# Patient Record
Sex: Female | Born: 1940 | Race: White | Hispanic: No | Marital: Married | Smoking: Never smoker
Health system: Southern US, Community
[De-identification: ages and names within clinical notes are randomized; demographics above are authoritative.]

## PROBLEM LIST (undated history)

## (undated) DIAGNOSIS — R200 Anesthesia of skin: Secondary | ICD-10-CM

## (undated) DIAGNOSIS — J189 Pneumonia, unspecified organism: Secondary | ICD-10-CM

## (undated) DIAGNOSIS — C801 Malignant (primary) neoplasm, unspecified: Secondary | ICD-10-CM

## (undated) DIAGNOSIS — M1990 Unspecified osteoarthritis, unspecified site: Secondary | ICD-10-CM

## (undated) HISTORY — PX: APPENDECTOMY: SHX54

## (undated) HISTORY — PX: DILATION AND CURETTAGE OF UTERUS: SHX78

## (undated) HISTORY — PX: TUBAL LIGATION: SHX77

## (undated) HISTORY — PX: BACK SURGERY: SHX140

## (undated) HISTORY — PX: TONSILLECTOMY: SUR1361

## (undated) HISTORY — PX: COLONOSCOPY: SHX174

## (undated) HISTORY — PX: BREAST SURGERY: SHX581

---

## 2006-03-07 DIAGNOSIS — C50912 Malignant neoplasm of unspecified site of left female breast: Secondary | ICD-10-CM | POA: Insufficient documentation

## 2006-03-07 DIAGNOSIS — C50412 Malignant neoplasm of upper-outer quadrant of left female breast: Secondary | ICD-10-CM | POA: Insufficient documentation

## 2006-03-07 HISTORY — DX: Malignant neoplasm of unspecified site of left female breast: C50.912

## 2006-03-07 HISTORY — DX: Malignant neoplasm of upper-outer quadrant of left female breast: C50.412

## 2006-04-28 ENCOUNTER — Ambulatory Visit: Payer: Self-pay | Admitting: Oncology

## 2006-06-11 ENCOUNTER — Ambulatory Visit: Payer: Self-pay | Admitting: Oncology

## 2006-07-01 ENCOUNTER — Ambulatory Visit: Payer: Self-pay | Admitting: Oncology

## 2006-08-18 ENCOUNTER — Ambulatory Visit: Payer: Self-pay | Admitting: Oncology

## 2006-10-03 ENCOUNTER — Ambulatory Visit: Payer: Self-pay | Admitting: Oncology

## 2006-11-11 ENCOUNTER — Ambulatory Visit: Admission: RE | Admit: 2006-11-11 | Discharge: 2007-02-09 | Payer: Self-pay | Admitting: Radiation Oncology

## 2007-01-22 ENCOUNTER — Ambulatory Visit: Payer: Self-pay | Admitting: Oncology

## 2007-04-16 ENCOUNTER — Ambulatory Visit: Payer: Self-pay | Admitting: Oncology

## 2007-07-03 ENCOUNTER — Ambulatory Visit: Payer: Self-pay | Admitting: Oncology

## 2007-08-11 ENCOUNTER — Encounter (INDEPENDENT_AMBULATORY_CARE_PROVIDER_SITE_OTHER): Payer: Self-pay | Admitting: Diagnostic Radiology

## 2007-08-11 ENCOUNTER — Encounter: Admission: RE | Admit: 2007-08-11 | Discharge: 2007-08-11 | Payer: Self-pay | Admitting: Oncology

## 2011-12-25 DIAGNOSIS — L570 Actinic keratosis: Secondary | ICD-10-CM | POA: Diagnosis not present

## 2012-01-29 DIAGNOSIS — M810 Age-related osteoporosis without current pathological fracture: Secondary | ICD-10-CM | POA: Diagnosis not present

## 2012-01-29 DIAGNOSIS — M25559 Pain in unspecified hip: Secondary | ICD-10-CM | POA: Diagnosis not present

## 2012-01-29 DIAGNOSIS — Z0000 Encounter for general adult medical examination without abnormal findings: Secondary | ICD-10-CM | POA: Diagnosis not present

## 2012-01-29 DIAGNOSIS — E785 Hyperlipidemia, unspecified: Secondary | ICD-10-CM | POA: Diagnosis not present

## 2012-01-29 DIAGNOSIS — C50919 Malignant neoplasm of unspecified site of unspecified female breast: Secondary | ICD-10-CM | POA: Diagnosis not present

## 2012-02-07 DIAGNOSIS — R946 Abnormal results of thyroid function studies: Secondary | ICD-10-CM | POA: Diagnosis not present

## 2012-02-07 DIAGNOSIS — M810 Age-related osteoporosis without current pathological fracture: Secondary | ICD-10-CM | POA: Diagnosis not present

## 2012-05-20 DIAGNOSIS — C50919 Malignant neoplasm of unspecified site of unspecified female breast: Secondary | ICD-10-CM | POA: Diagnosis not present

## 2012-05-22 DIAGNOSIS — M899 Disorder of bone, unspecified: Secondary | ICD-10-CM | POA: Diagnosis not present

## 2012-05-22 DIAGNOSIS — C50919 Malignant neoplasm of unspecified site of unspecified female breast: Secondary | ICD-10-CM | POA: Diagnosis not present

## 2012-05-22 DIAGNOSIS — M949 Disorder of cartilage, unspecified: Secondary | ICD-10-CM | POA: Diagnosis not present

## 2012-06-05 DIAGNOSIS — E785 Hyperlipidemia, unspecified: Secondary | ICD-10-CM | POA: Diagnosis not present

## 2012-07-07 DIAGNOSIS — Z23 Encounter for immunization: Secondary | ICD-10-CM | POA: Diagnosis not present

## 2012-08-27 DIAGNOSIS — M5137 Other intervertebral disc degeneration, lumbosacral region: Secondary | ICD-10-CM | POA: Diagnosis not present

## 2012-08-27 DIAGNOSIS — M25559 Pain in unspecified hip: Secondary | ICD-10-CM | POA: Diagnosis not present

## 2012-08-28 DIAGNOSIS — M5137 Other intervertebral disc degeneration, lumbosacral region: Secondary | ICD-10-CM | POA: Diagnosis not present

## 2012-08-28 DIAGNOSIS — M4120 Other idiopathic scoliosis, site unspecified: Secondary | ICD-10-CM | POA: Diagnosis not present

## 2012-08-28 DIAGNOSIS — M47817 Spondylosis without myelopathy or radiculopathy, lumbosacral region: Secondary | ICD-10-CM | POA: Diagnosis not present

## 2012-08-28 DIAGNOSIS — M25559 Pain in unspecified hip: Secondary | ICD-10-CM | POA: Diagnosis not present

## 2012-09-22 DIAGNOSIS — IMO0002 Reserved for concepts with insufficient information to code with codable children: Secondary | ICD-10-CM | POA: Diagnosis not present

## 2012-09-22 DIAGNOSIS — M1610 Unilateral primary osteoarthritis, unspecified hip: Secondary | ICD-10-CM | POA: Diagnosis not present

## 2012-09-25 DIAGNOSIS — M1610 Unilateral primary osteoarthritis, unspecified hip: Secondary | ICD-10-CM | POA: Diagnosis not present

## 2012-11-26 DIAGNOSIS — H43399 Other vitreous opacities, unspecified eye: Secondary | ICD-10-CM | POA: Diagnosis not present

## 2012-11-26 DIAGNOSIS — H2589 Other age-related cataract: Secondary | ICD-10-CM | POA: Diagnosis not present

## 2012-11-26 DIAGNOSIS — H52229 Regular astigmatism, unspecified eye: Secondary | ICD-10-CM | POA: Diagnosis not present

## 2012-11-26 DIAGNOSIS — H5200 Hypermetropia, unspecified eye: Secondary | ICD-10-CM | POA: Diagnosis not present

## 2013-03-15 DIAGNOSIS — M810 Age-related osteoporosis without current pathological fracture: Secondary | ICD-10-CM | POA: Diagnosis not present

## 2013-03-15 DIAGNOSIS — J309 Allergic rhinitis, unspecified: Secondary | ICD-10-CM | POA: Diagnosis not present

## 2013-03-15 DIAGNOSIS — E785 Hyperlipidemia, unspecified: Secondary | ICD-10-CM | POA: Diagnosis not present

## 2013-03-15 DIAGNOSIS — Z0000 Encounter for general adult medical examination without abnormal findings: Secondary | ICD-10-CM | POA: Diagnosis not present

## 2013-03-23 DIAGNOSIS — Z1211 Encounter for screening for malignant neoplasm of colon: Secondary | ICD-10-CM | POA: Diagnosis not present

## 2013-03-30 DIAGNOSIS — M1610 Unilateral primary osteoarthritis, unspecified hip: Secondary | ICD-10-CM | POA: Diagnosis not present

## 2013-04-02 DIAGNOSIS — M1610 Unilateral primary osteoarthritis, unspecified hip: Secondary | ICD-10-CM | POA: Diagnosis not present

## 2013-04-02 DIAGNOSIS — M169 Osteoarthritis of hip, unspecified: Secondary | ICD-10-CM | POA: Diagnosis not present

## 2013-04-02 DIAGNOSIS — M79609 Pain in unspecified limb: Secondary | ICD-10-CM | POA: Diagnosis not present

## 2013-04-07 ENCOUNTER — Encounter (HOSPITAL_COMMUNITY): Payer: Self-pay | Admitting: Pharmacy Technician

## 2013-04-07 ENCOUNTER — Other Ambulatory Visit (HOSPITAL_COMMUNITY): Payer: Self-pay | Admitting: Orthopaedic Surgery

## 2013-04-07 DIAGNOSIS — M169 Osteoarthritis of hip, unspecified: Secondary | ICD-10-CM | POA: Diagnosis not present

## 2013-04-07 DIAGNOSIS — M25559 Pain in unspecified hip: Secondary | ICD-10-CM | POA: Diagnosis not present

## 2013-04-12 ENCOUNTER — Encounter (HOSPITAL_COMMUNITY): Payer: Self-pay | Admitting: *Deleted

## 2013-04-12 MED ORDER — CEFAZOLIN SODIUM-DEXTROSE 2-3 GM-% IV SOLR
2.0000 g | INTRAVENOUS | Status: AC
  Administered 2013-04-13: 2 g via INTRAVENOUS
  Filled 2013-04-12: qty 50

## 2013-04-12 NOTE — Progress Notes (Addendum)
Pt denies SOB, chest pain, and being under the cardiologist. According to pt, " I had an echo done more than 5 years ago when I had breast cancer."  Pt denies having any other cardiac tests done ( no stress, or cardiac cath). Pt advised to Stop taking Aspirin and herbal medications (Flaxseed, Linseed, OIL and   glucosamine-chondroitin 500-400 MG table). Do not take any NSAIDs ie: Ibuprofen, Advil, Naproxen or any medication containing Aspirin.

## 2013-04-13 ENCOUNTER — Encounter (HOSPITAL_COMMUNITY)
Admission: RE | Disposition: A | Discharge status: Home Health | Payer: Self-pay | Source: Ambulatory Visit | Attending: Orthopaedic Surgery

## 2013-04-13 ENCOUNTER — Encounter (HOSPITAL_COMMUNITY): Payer: Self-pay

## 2013-04-13 ENCOUNTER — Encounter (HOSPITAL_COMMUNITY): Payer: Self-pay | Admitting: Critical Care Medicine

## 2013-04-13 ENCOUNTER — Inpatient Hospital Stay (HOSPITAL_COMMUNITY)
Admission: RE | Admit: 2013-04-13 | Discharge: 2013-04-16 | DRG: 470 | Disposition: A | Discharge status: Home Health | Payer: Medicare Other | Source: Ambulatory Visit | Attending: Orthopaedic Surgery | Admitting: Orthopaedic Surgery

## 2013-04-13 ENCOUNTER — Inpatient Hospital Stay (HOSPITAL_COMMUNITY): Payer: Medicare Other

## 2013-04-13 ENCOUNTER — Inpatient Hospital Stay (HOSPITAL_COMMUNITY): Payer: Medicare Other | Admitting: Critical Care Medicine

## 2013-04-13 DIAGNOSIS — Z9851 Tubal ligation status: Secondary | ICD-10-CM | POA: Diagnosis not present

## 2013-04-13 DIAGNOSIS — D62 Acute posthemorrhagic anemia: Secondary | ICD-10-CM | POA: Diagnosis not present

## 2013-04-13 DIAGNOSIS — Z853 Personal history of malignant neoplasm of breast: Secondary | ICD-10-CM | POA: Diagnosis not present

## 2013-04-13 DIAGNOSIS — M1610 Unilateral primary osteoarthritis, unspecified hip: Secondary | ICD-10-CM | POA: Diagnosis not present

## 2013-04-13 DIAGNOSIS — Z471 Aftercare following joint replacement surgery: Secondary | ICD-10-CM | POA: Diagnosis not present

## 2013-04-13 DIAGNOSIS — Z96649 Presence of unspecified artificial hip joint: Secondary | ICD-10-CM | POA: Diagnosis not present

## 2013-04-13 DIAGNOSIS — Z7982 Long term (current) use of aspirin: Secondary | ICD-10-CM | POA: Diagnosis not present

## 2013-04-13 DIAGNOSIS — M169 Osteoarthritis of hip, unspecified: Secondary | ICD-10-CM

## 2013-04-13 DIAGNOSIS — Z79899 Other long term (current) drug therapy: Secondary | ICD-10-CM | POA: Diagnosis not present

## 2013-04-13 DIAGNOSIS — M259 Joint disorder, unspecified: Secondary | ICD-10-CM | POA: Diagnosis not present

## 2013-04-13 DIAGNOSIS — Z9089 Acquired absence of other organs: Secondary | ICD-10-CM

## 2013-04-13 DIAGNOSIS — Z882 Allergy status to sulfonamides status: Secondary | ICD-10-CM

## 2013-04-13 DIAGNOSIS — M25559 Pain in unspecified hip: Secondary | ICD-10-CM | POA: Diagnosis not present

## 2013-04-13 HISTORY — DX: Pneumonia, unspecified organism: J18.9

## 2013-04-13 HISTORY — DX: Anesthesia of skin: R20.0

## 2013-04-13 HISTORY — PX: TOTAL HIP ARTHROPLASTY: SHX124

## 2013-04-13 HISTORY — DX: Malignant (primary) neoplasm, unspecified: C80.1

## 2013-04-13 HISTORY — DX: Unspecified osteoarthritis, unspecified site: M19.90

## 2013-04-13 HISTORY — DX: Osteoarthritis of hip, unspecified: M16.9

## 2013-04-13 LAB — CBC
MCH: 30.8 pg (ref 26.0–34.0)
Platelets: 239 10*3/uL (ref 150–400)
RDW: 13.5 % (ref 11.5–15.5)

## 2013-04-13 LAB — BASIC METABOLIC PANEL
Calcium: 9.2 mg/dL (ref 8.4–10.5)
Chloride: 107 mEq/L (ref 96–112)
Creatinine, Ser: 0.7 mg/dL (ref 0.50–1.10)
GFR calc Af Amer: >90 mL/min (ref >90)
GFR calc non Af Amer: 83 mL/min — ABNORMAL LOW (ref >90)
Sodium: 141 mEq/L (ref 135–145)

## 2013-04-13 LAB — URINALYSIS, ROUTINE W REFLEX MICROSCOPIC
Leukocytes, UA: NEGATIVE
Nitrite: NEGATIVE
Protein, ur: NEGATIVE mg/dL
Specific Gravity, Urine: 1.014 (ref 1.005–1.030)

## 2013-04-13 LAB — SURGICAL PCR SCREEN: MRSA, PCR: NEGATIVE

## 2013-04-13 SURGERY — ARTHROPLASTY, HIP, TOTAL, ANTERIOR APPROACH
Anesthesia: General | Site: Hip | Laterality: Right | Wound class: Clean

## 2013-04-13 MED ORDER — ALUM & MAG HYDROXIDE-SIMETH 200-200-20 MG/5ML PO SUSP: 30.0000 mL | ORAL | Status: DC | PRN

## 2013-04-13 MED ORDER — METHOCARBAMOL 500 MG PO TABS
500.0000 mg | ORAL_TABLET | Freq: Four times a day (QID) | ORAL | Status: DC | PRN
  Administered 2013-04-13 – 2013-04-16 (×3): 500 mg via ORAL
  Filled 2013-04-13 (×2): qty 1

## 2013-04-13 MED ORDER — 0.9 % SODIUM CHLORIDE (POUR BTL) OPTIME
TOPICAL | Status: DC | PRN
  Administered 2013-04-13: 1000 mL

## 2013-04-13 MED ORDER — BISACODYL 5 MG PO TBEC: 5.0000 mg | DELAYED_RELEASE_TABLET | Freq: Every day | ORAL | Status: DC | PRN

## 2013-04-13 MED ORDER — CEFAZOLIN SODIUM 1-5 GM-% IV SOLN
1.0000 g | Freq: Four times a day (QID) | INTRAVENOUS | Status: AC
  Administered 2013-04-13 – 2013-04-14 (×2): 1 g via INTRAVENOUS
  Filled 2013-04-13 (×2): qty 50

## 2013-04-13 MED ORDER — LACTATED RINGERS IV SOLN
INTRAVENOUS | Status: DC
  Administered 2013-04-13 (×2): via INTRAVENOUS

## 2013-04-13 MED ORDER — SODIUM CHLORIDE 0.9 % IV SOLN
INTRAVENOUS | Status: DC
  Administered 2013-04-13: 23:00:00 via INTRAVENOUS

## 2013-04-13 MED ORDER — OXYCODONE HCL 5 MG/5ML PO SOLN
ORAL | Status: AC
  Filled 2013-04-13: qty 10

## 2013-04-13 MED ORDER — ALBUMIN HUMAN 5 % IV SOLN
INTRAVENOUS | Status: DC | PRN
  Administered 2013-04-13 (×2): via INTRAVENOUS

## 2013-04-13 MED ORDER — HYDROMORPHONE HCL PF 1 MG/ML IJ SOLN
1.0000 mg | INTRAMUSCULAR | Status: DC | PRN
  Filled 2013-04-13: qty 1

## 2013-04-13 MED ORDER — PHENYLEPHRINE HCL 10 MG/ML IJ SOLN
INTRAMUSCULAR | Status: DC | PRN
  Administered 2013-04-13 (×2): 80 ug via INTRAVENOUS
  Administered 2013-04-13: 120 ug via INTRAVENOUS
  Administered 2013-04-13: 80 ug via INTRAVENOUS

## 2013-04-13 MED ORDER — ONDANSETRON HCL 4 MG/2ML IJ SOLN
4.0000 mg | Freq: Four times a day (QID) | INTRAMUSCULAR | Status: DC | PRN
  Administered 2013-04-13: 4 mg via INTRAVENOUS
  Filled 2013-04-13: qty 2

## 2013-04-13 MED ORDER — DOCUSATE SODIUM 100 MG PO CAPS
100.0000 mg | ORAL_CAPSULE | Freq: Two times a day (BID) | ORAL | Status: DC
  Administered 2013-04-13 – 2013-04-16 (×6): 100 mg via ORAL
  Filled 2013-04-13 (×7): qty 1

## 2013-04-13 MED ORDER — MENTHOL 3 MG MT LOZG: 1.0000 | LOZENGE | OROMUCOSAL | Status: DC | PRN

## 2013-04-13 MED ORDER — METHOCARBAMOL 100 MG/ML IJ SOLN
500.0000 mg | Freq: Four times a day (QID) | INTRAVENOUS | Status: DC | PRN
  Filled 2013-04-13: qty 5

## 2013-04-13 MED ORDER — METHOCARBAMOL 500 MG PO TABS
ORAL_TABLET | ORAL | Status: AC
  Filled 2013-04-13: qty 1

## 2013-04-13 MED ORDER — PROPOFOL 10 MG/ML IV BOLUS
INTRAVENOUS | Status: DC | PRN
  Administered 2013-04-13: 90 mg via INTRAVENOUS

## 2013-04-13 MED ORDER — ACETAMINOPHEN 325 MG PO TABS
650.0000 mg | ORAL_TABLET | Freq: Four times a day (QID) | ORAL | Status: DC | PRN
  Administered 2013-04-15: 650 mg via ORAL
  Filled 2013-04-13: qty 2

## 2013-04-13 MED ORDER — LIDOCAINE HCL (CARDIAC) 20 MG/ML IV SOLN
INTRAVENOUS | Status: DC | PRN
  Administered 2013-04-13: 40 mg via INTRAVENOUS

## 2013-04-13 MED ORDER — POLYETHYLENE GLYCOL 3350 17 G PO PACK: 17.0000 g | PACK | Freq: Every day | ORAL | Status: DC | PRN

## 2013-04-13 MED ORDER — PHENOL 1.4 % MT LIQD: 1.0000 | OROMUCOSAL | Status: DC | PRN

## 2013-04-13 MED ORDER — HYDROMORPHONE HCL PF 1 MG/ML IJ SOLN
INTRAMUSCULAR | Status: AC
  Filled 2013-04-13: qty 1

## 2013-04-13 MED ORDER — PHENYLEPHRINE HCL 10 MG/ML IJ SOLN
10.0000 mg | INTRAVENOUS | Status: DC | PRN
  Administered 2013-04-13: 40 ug/min via INTRAVENOUS

## 2013-04-13 MED ORDER — ARTIFICIAL TEARS OP OINT
TOPICAL_OINTMENT | OPHTHALMIC | Status: DC | PRN
  Administered 2013-04-13: 1 via OPHTHALMIC

## 2013-04-13 MED ORDER — SODIUM CHLORIDE 0.9 % IR SOLN
Status: DC | PRN
  Administered 2013-04-13: 3000 mL

## 2013-04-13 MED ORDER — ROCURONIUM BROMIDE 100 MG/10ML IV SOLN
INTRAVENOUS | Status: DC | PRN
  Administered 2013-04-13: 40 mg via INTRAVENOUS

## 2013-04-13 MED ORDER — NEOSTIGMINE METHYLSULFATE 1 MG/ML IJ SOLN
INTRAMUSCULAR | Status: DC | PRN
  Administered 2013-04-13: 4 mg via INTRAVENOUS

## 2013-04-13 MED ORDER — FENTANYL CITRATE 0.05 MG/ML IJ SOLN
INTRAMUSCULAR | Status: DC | PRN
  Administered 2013-04-13 (×3): 50 ug via INTRAVENOUS
  Administered 2013-04-13 (×2): 100 ug via INTRAVENOUS
  Administered 2013-04-13: 50 ug via INTRAVENOUS

## 2013-04-13 MED ORDER — GLYCOPYRROLATE 0.2 MG/ML IJ SOLN
INTRAMUSCULAR | Status: DC | PRN
  Administered 2013-04-13: .4 mg via INTRAVENOUS

## 2013-04-13 MED ORDER — ASPIRIN EC 325 MG PO TBEC
325.0000 mg | DELAYED_RELEASE_TABLET | Freq: Two times a day (BID) | ORAL | Status: DC
  Administered 2013-04-14 – 2013-04-16 (×5): 325 mg via ORAL
  Filled 2013-04-13 (×7): qty 1

## 2013-04-13 MED ORDER — ACETAMINOPHEN 650 MG RE SUPP: 650.0000 mg | Freq: Four times a day (QID) | RECTAL | Status: DC | PRN

## 2013-04-13 MED ORDER — METOCLOPRAMIDE HCL 5 MG/ML IJ SOLN: 5.0000 mg | Freq: Three times a day (TID) | INTRAMUSCULAR | Status: DC | PRN

## 2013-04-13 MED ORDER — OXYCODONE HCL 5 MG PO TABS
5.0000 mg | ORAL_TABLET | ORAL | Status: DC | PRN
  Administered 2013-04-13 – 2013-04-14 (×2): 10 mg via ORAL
  Administered 2013-04-15 – 2013-04-16 (×2): 5 mg via ORAL
  Filled 2013-04-13: qty 2
  Filled 2013-04-13 (×2): qty 1

## 2013-04-13 MED ORDER — DIPHENHYDRAMINE HCL 12.5 MG/5ML PO ELIX: 12.5000 mg | ORAL_SOLUTION | ORAL | Status: DC | PRN

## 2013-04-13 MED ORDER — MUPIROCIN 2 % EX OINT
TOPICAL_OINTMENT | CUTANEOUS | Status: AC
  Administered 2013-04-13: 1 via NASAL
  Filled 2013-04-13: qty 22

## 2013-04-13 MED ORDER — METOCLOPRAMIDE HCL 10 MG PO TABS: 5.0000 mg | ORAL_TABLET | Freq: Three times a day (TID) | ORAL | Status: DC | PRN

## 2013-04-13 MED ORDER — FERROUS SULFATE 325 (65 FE) MG PO TABS
325.0000 mg | ORAL_TABLET | Freq: Three times a day (TID) | ORAL | Status: DC
  Administered 2013-04-14 – 2013-04-16 (×7): 325 mg via ORAL
  Filled 2013-04-13 (×11): qty 1

## 2013-04-13 MED ORDER — MIDAZOLAM HCL 5 MG/5ML IJ SOLN
INTRAMUSCULAR | Status: DC | PRN
  Administered 2013-04-13: 2 mg via INTRAVENOUS

## 2013-04-13 MED ORDER — ZOLPIDEM TARTRATE 5 MG PO TABS: 5.0000 mg | ORAL_TABLET | Freq: Every evening | ORAL | Status: DC | PRN

## 2013-04-13 MED ORDER — ONDANSETRON HCL 4 MG PO TABS: 4.0000 mg | ORAL_TABLET | Freq: Four times a day (QID) | ORAL | Status: DC | PRN

## 2013-04-13 MED ORDER — ONDANSETRON HCL 4 MG/2ML IJ SOLN
INTRAMUSCULAR | Status: DC | PRN
  Administered 2013-04-13: 4 mg via INTRAVENOUS

## 2013-04-13 MED ORDER — MUPIROCIN 2 % EX OINT
TOPICAL_OINTMENT | Freq: Two times a day (BID) | CUTANEOUS | Status: DC
Start: 2013-04-13 — End: 2013-04-13
  Administered 2013-04-13: 1 via NASAL

## 2013-04-13 SURGICAL SUPPLY — 58 items
ADH SKN CLS APL DERMABOND .7 (GAUZE/BANDAGES/DRESSINGS) ×1
ADH SKN CLS LQ APL DERMABOND (GAUZE/BANDAGES/DRESSINGS) ×1
BANDAGE GAUZE ELAST BULKY 4 IN (GAUZE/BANDAGES/DRESSINGS) IMPLANT
BLADE SAW SGTL 18X1.27X75 (BLADE) ×2 IMPLANT
BLADE SURG ROTATE 9660 (MISCELLANEOUS) IMPLANT
CANISTER SUCTION 2500CC (MISCELLANEOUS) ×1 IMPLANT
CAPT HIP PF MOP ×1 IMPLANT
CELLS DAT CNTRL 66122 CELL SVR (MISCELLANEOUS) ×1 IMPLANT
CLOTH BEACON ORANGE TIMEOUT ST (SAFETY) ×2 IMPLANT
COVER BACK TABLE 24X17X13 BIG (DRAPES) IMPLANT
COVER SURGICAL LIGHT HANDLE (MISCELLANEOUS) ×2 IMPLANT
DERMABOND ADHESIVE PROPEN (GAUZE/BANDAGES/DRESSINGS) ×1
DERMABOND ADVANCED (GAUZE/BANDAGES/DRESSINGS) ×1
DERMABOND ADVANCED .7 DNX12 (GAUZE/BANDAGES/DRESSINGS) IMPLANT
DERMABOND ADVANCED .7 DNX6 (GAUZE/BANDAGES/DRESSINGS) ×1 IMPLANT
DRAPE C-ARM 42X72 X-RAY (DRAPES) ×2 IMPLANT
DRAPE STERI IOBAN 125X83 (DRAPES) ×2 IMPLANT
DRAPE U-SHAPE 47X51 STRL (DRAPES) ×6 IMPLANT
DRSG AQUACEL AG ADV 3.5X10 (GAUZE/BANDAGES/DRESSINGS) ×2 IMPLANT
DURAPREP 26ML APPLICATOR (WOUND CARE) ×2 IMPLANT
ELECT BLADE 4.0 EZ CLEAN MEGAD (MISCELLANEOUS)
ELECT BLADE TIP CTD 4 INCH (ELECTRODE) ×2 IMPLANT
ELECT CAUTERY BLADE 6.4 (BLADE) ×2 IMPLANT
ELECT REM PT RETURN 9FT ADLT (ELECTROSURGICAL) ×2
ELECTRODE BLDE 4.0 EZ CLN MEGD (MISCELLANEOUS) IMPLANT
ELECTRODE REM PT RTRN 9FT ADLT (ELECTROSURGICAL) ×1 IMPLANT
FACESHIELD LNG OPTICON STERILE (SAFETY) ×4 IMPLANT
GAUZE XEROFORM 1X8 LF (GAUZE/BANDAGES/DRESSINGS) ×2 IMPLANT
GLOVE BIOGEL PI IND STRL 8 (GLOVE) ×2 IMPLANT
GLOVE BIOGEL PI INDICATOR 8 (GLOVE) ×2
GLOVE ECLIPSE 8.0 STRL XLNG CF (GLOVE) ×2 IMPLANT
GLOVE SURG ORTHO 8.0 STRL STRW (GLOVE) ×2 IMPLANT
GOWN STRL REIN XL XLG (GOWN DISPOSABLE) ×4 IMPLANT
HANDPIECE INTERPULSE COAX TIP (DISPOSABLE) ×2
KIT BASIN OR (CUSTOM PROCEDURE TRAY) ×2 IMPLANT
KIT ROOM TURNOVER OR (KITS) ×2 IMPLANT
MANIFOLD NEPTUNE II (INSTRUMENTS) ×2 IMPLANT
NS IRRIG 1000ML POUR BTL (IV SOLUTION) ×2 IMPLANT
PACK TOTAL JOINT (CUSTOM PROCEDURE TRAY) ×2 IMPLANT
PAD ARMBOARD 7.5X6 YLW CONV (MISCELLANEOUS) ×4 IMPLANT
RETRACTOR WND ALEXIS 18 MED (MISCELLANEOUS) ×1 IMPLANT
RTRCTR WOUND ALEXIS 18CM MED (MISCELLANEOUS) ×2
SET HNDPC FAN SPRY TIP SCT (DISPOSABLE) ×1 IMPLANT
SPONGE LAP 18X18 X RAY DECT (DISPOSABLE) IMPLANT
SPONGE LAP 4X18 X RAY DECT (DISPOSABLE) IMPLANT
STAPLER VISISTAT 35W (STAPLE) ×2 IMPLANT
SUT ETHIBOND NAB CT1 #1 30IN (SUTURE) ×4 IMPLANT
SUT MNCRL AB 3-0 PS2 27 (SUTURE) ×2 IMPLANT
SUT VIC AB 0 CT1 27 (SUTURE) ×4
SUT VIC AB 0 CT1 27XBRD ANBCTR (SUTURE) ×2 IMPLANT
SUT VIC AB 1 CT1 27 (SUTURE) ×4
SUT VIC AB 1 CT1 27XBRD ANBCTR (SUTURE) ×2 IMPLANT
SUT VIC AB 2-0 CT1 27 (SUTURE) ×4
SUT VIC AB 2-0 CT1 TAPERPNT 27 (SUTURE) ×2 IMPLANT
TOWEL OR 17X24 6PK STRL BLUE (TOWEL DISPOSABLE) ×2 IMPLANT
TOWEL OR 17X26 10 PK STRL BLUE (TOWEL DISPOSABLE) ×2 IMPLANT
TRAY FOLEY CATH 14FR (SET/KITS/TRAYS/PACK) ×1 IMPLANT
WATER STERILE IRR 1000ML POUR (IV SOLUTION) ×2 IMPLANT

## 2013-04-13 NOTE — Anesthesia Postprocedure Evaluation (Signed)
Anesthesia Post Note  Patient: Carrie Wilkerson  Procedure(s) Performed: Procedure(s) (LRB): RIGHT TOTAL HIP ARTHROPLASTY ANTERIOR APPROACH (Right)  Anesthesia type: General  Patient location: PACU  Post pain: Pain level controlled and Adequate analgesia  Post assessment: Post-op Vital signs reviewed, Patient's Cardiovascular Status Stable, Respiratory Function Stable, Patent Airway and Pain level controlled  Last Vitals:  Filed Vitals:   04/13/13 1750  BP: 127/52  Pulse:   Temp: 36.3 C  Resp: 16    Post vital signs: Reviewed and stable  Level of consciousness: awake, alert  and oriented  Complications: No apparent anesthesia complications

## 2013-04-13 NOTE — H&P (Signed)
TOTAL HIP ADMISSION H&P  Patient is admitted for right total hip arthroplasty.  Subjective:  Chief Complaint: right hip pain  HPI: Carrie Wilkerson, 72 y.o. female, has a history of pain and functional disability in the right hip(s) due to arthritis and patient has failed non-surgical conservative treatments for greater than 12 weeks to include NSAID's and/or analgesics, corticosteriod injections, use of assistive devices and activity modification.  Onset of symptoms was abrupt starting 1 years ago with rapidlly worsening course since that time.The patient noted no past surgery on the right hip(s).  Patient currently rates pain in the right hip at 10 out of 10 with activity. Patient has night pain, worsening of pain with activity and weight bearing, trendelenberg gait, pain that interfers with activities of daily living and pain with passive range of motion. Patient has evidence of subchondral cysts, subchondral sclerosis, periarticular osteophytes and joint space narrowing by imaging studies. This condition presents safety issues increasing the risk of falls.  There is no current active infection.  Patient Active Problem List   Diagnosis Date Noted  . Degenerative arthritis of hip 04/13/2013   Past Medical History  Diagnosis Date  . Cancer     Hx: of breast cancer in 2007  . Pneumonia     Hx: of 'a long time ago"  . Numbness in right leg     Hx: of  . Arthritis     Past Surgical History  Procedure Laterality Date  . Breast surgery      Hx: of lumpectomy left breast 2007  . Colonoscopy      Hx: of  . Back surgery    . Tonsillectomy    . Appendectomy    . Dilation and curettage of uterus    . Tubal ligation      Prescriptions prior to admission  Medication Sig Dispense Refill  . alendronate (FOSAMAX) 70 MG tablet Take 70 mg by mouth every 7 (seven) days. Take with a full glass of water on an empty stomach.      Marland Kitchen aspirin 81 MG chewable tablet Chew 81 mg by mouth daily.      Marland Kitchen  CALCIUM PO Take 1 tablet by mouth daily.      . cholecalciferol (VITAMIN D) 1000 UNITS tablet Take 1,000 Units by mouth daily.      . Flaxseed, Linseed, OIL Take 1 capsule by mouth daily.      Marland Kitchen glucosamine-chondroitin 500-400 MG tablet Take 1 tablet by mouth 3 (three) times daily.      . Multiple Vitamin (MULTIVITAMIN WITH MINERALS) TABS Take 1 tablet by mouth daily.      Marland Kitchen PRESCRIPTION MEDICATION Take 1 tablet by mouth daily. Prescription estrogen blocker       Allergies  Allergen Reactions  . Codeine     unknown   . Other     "Chloro-prep": unknown   . Sulfa Antibiotics     unknown     History  Substance Use Topics  . Smoking status: Never Smoker   . Smokeless tobacco: Never Used  . Alcohol Use: No    Family History  Problem Relation Age of Onset  . Hypertension Sister   . Hypertension Brother   . Cancer Other      Review of Systems  Musculoskeletal: Positive for joint pain.  All other systems reviewed and are negative.    Objective:  Physical Exam  Constitutional: She is oriented to person, place, and time. She appears well-developed and well-nourished.  HENT:  Head: Normocephalic and atraumatic.  Eyes: EOM are normal. Pupils are equal, round, and reactive to light.  Neck: Normal range of motion. Neck supple.  GI: Bowel sounds are normal.  Musculoskeletal:       Right hip: She exhibits decreased range of motion, decreased strength, bony tenderness and crepitus.  Neurological: She is alert and oriented to person, place, and time.  Skin: Skin is warm.  Psychiatric: She has a normal mood and affect.    Vital signs in last 24 hours: Weight:  [70.761 kg (156 lb)] 70.761 kg (156 lb) (07/07 1550)  Labs:   Estimated body mass index is 28.06 kg/(m^2) as calculated from the following:   Height as of this encounter: 5' 2.5" (1.588 m).   Weight as of this encounter: 70.761 kg (156 lb).   Imaging Review Plain radiographs demonstrate severe degenerative joint  disease of the right hip(s). The bone quality appears to be good for age and reported activity level.  Assessment/Plan:  End stage arthritis, right hip(s)  The patient history, physical examination, clinical judgement of the provider and imaging studies are consistent with end stage degenerative joint disease of the right hip(s) and total hip arthroplasty is deemed medically necessary. The treatment options including medical management, injection therapy, arthroscopy and arthroplasty were discussed at length. The risks and benefits of total hip arthroplasty were presented and reviewed. The risks due to aseptic loosening, infection, stiffness, dislocation/subluxation,  thromboembolic complications and other imponderables were discussed.  The patient acknowledged the explanation, agreed to proceed with the plan and consent was signed. Patient is being admitted for inpatient treatment for surgery, pain control, PT, OT, prophylactic antibiotics, VTE prophylaxis, progressive ambulation and ADL's and discharge planning.The patient is planning to be discharged to skilled nursing facility

## 2013-04-13 NOTE — Brief Op Note (Signed)
04/13/2013  5:31 PM  PATIENT:  Corinne Ports  72 y.o. female  PRE-OPERATIVE DIAGNOSIS:  Severe osteoarthritis right hip  POST-OPERATIVE DIAGNOSIS:  Severe osteoarthritis right hip  PROCEDURE:  Procedure(s): RIGHT TOTAL HIP ARTHROPLASTY ANTERIOR APPROACH (Right)  SURGEON:  Surgeon(s) and Role:    * Kathryne Hitch, MD - Primary  PHYSICIAN ASSISTANT: Rexene Edison, PA-C   ANESTHESIA:   general  EBL:  Total I/O In: 1800 [I.V.:1300; IV Piggyback:500] Out: 600 [Urine:200; Blood:400]  BLOOD ADMINISTERED:none  DRAINS: none   LOCAL MEDICATIONS USED:  NONE  SPECIMEN:  No Specimen  DISPOSITION OF SPECIMEN:  N/A  COUNTS:  YES  TOURNIQUET:  * No tourniquets in log *  DICTATION: .Other Dictation: Dictation Number [id number]  PLAN OF CARE: Admit to inpatient   PATIENT DISPOSITION:  PACU - hemodynamically stable.   Delay start of Pharmacological VTE agent (>24hrs) due to surgical blood loss or risk of bleeding: no

## 2013-04-13 NOTE — Transfer of Care (Signed)
Immediate Anesthesia Transfer of Care Note  Patient: Carrie Wilkerson  Procedure(s) Performed: Procedure(s): RIGHT TOTAL HIP ARTHROPLASTY ANTERIOR APPROACH (Right)  Patient Location: PACU  Anesthesia Type:General  Level of Consciousness: awake and sedated  Airway & Oxygen Therapy: Patient Spontanous Breathing and Patient connected to face mask oxygen  Post-op Assessment: Report given to PACU RN, Post -op Vital signs reviewed and stable and Patient moving all extremities  Post vital signs: Reviewed and stable  Complications: No apparent anesthesia complications

## 2013-04-13 NOTE — Anesthesia Procedure Notes (Signed)
Procedure Name: Intubation Date/Time: 04/13/2013 3:40 PM Performed by: Elon Alas Pre-anesthesia Checklist: Patient identified, Emergency Drugs available, Suction available, Timeout performed and Patient being monitored Patient Re-evaluated:Patient Re-evaluated prior to inductionOxygen Delivery Method: Circle system utilized Preoxygenation: Pre-oxygenation with 100% oxygen Intubation Type: IV induction Laryngoscope Size: Mac and 3 Grade View: Grade III Tube type: Oral Tube size: 7.0 mm Number of attempts: 1 Airway Equipment and Method: Stylet Placement Confirmation: positive ETCO2,  ETT inserted through vocal cords under direct vision and breath sounds checked- equal and bilateral Secured at: 21 cm Tube secured with: Tape Dental Injury: Teeth and Oropharynx as per pre-operative assessment

## 2013-04-13 NOTE — Anesthesia Postprocedure Evaluation (Signed)
  Anesthesia Post-op Note  Patient: Carrie Wilkerson  Procedure(s) Performed: Procedure(s): RIGHT TOTAL HIP ARTHROPLASTY ANTERIOR APPROACH (Right)  Patient Location: PACU  Anesthesia Type:General  Level of Consciousness: awake, alert  and oriented  Airway and Oxygen Therapy: Patient Spontanous Breathing and Patient connected to nasal cannula oxygen  Post-op Pain: mild  Post-op Assessment: Post-op Vital signs reviewed, Patient's Cardiovascular Status Stable, Respiratory Function Stable and Pain level controlled  Post-op Vital Signs: stable  Complications: No apparent anesthesia complications

## 2013-04-13 NOTE — Anesthesia Preprocedure Evaluation (Addendum)
Anesthesia Evaluation  Patient identified by MRN, date of birth, ID band Patient awake    Reviewed: Allergy & Precautions, H&P , NPO status , Patient's Chart, lab work & pertinent test results  Airway Mallampati: II TM Distance: >3 FB     Dental  (+) Teeth Intact and Dental Advisory Given   Pulmonary          Cardiovascular     Neuro/Psych    GI/Hepatic   Endo/Other    Renal/GU      Musculoskeletal  (+) Arthritis -,   Abdominal   Peds  Hematology   Anesthesia Other Findings   Reproductive/Obstetrics                          Anesthesia Physical Anesthesia Plan  ASA: II  Anesthesia Plan: General   Post-op Pain Management:    Induction: Intravenous  Airway Management Planned: Oral ETT  Additional Equipment:   Intra-op Plan:   Post-operative Plan: Extubation in OR  Informed Consent: I have reviewed the patients History and Physical, chart, labs and discussed the procedure including the risks, benefits and alternatives for the proposed anesthesia with the patient or authorized representative who has indicated his/her understanding and acceptance.   Dental advisory given  Plan Discussed with: Anesthesiologist and Surgeon  Anesthesia Plan Comments:         Anesthesia Quick Evaluation

## 2013-04-13 NOTE — Preoperative (Signed)
Beta Blockers   Reason not to administer Beta Blockers:Not Applicable 

## 2013-04-14 ENCOUNTER — Encounter (HOSPITAL_COMMUNITY): Payer: Self-pay | Admitting: General Practice

## 2013-04-14 LAB — CBC
Hemoglobin: 9.7 g/dL — ABNORMAL LOW (ref 12.0–15.0)
MCHC: 33.8 g/dL (ref 30.0–36.0)
RBC: 3.09 MIL/uL — ABNORMAL LOW (ref 3.87–5.11)

## 2013-04-14 LAB — BASIC METABOLIC PANEL
CO2: 28 mEq/L (ref 19–32)
GFR calc non Af Amer: 81 mL/min — ABNORMAL LOW (ref >90)
Glucose, Bld: 90 mg/dL (ref 70–99)
Potassium: 4.2 mEq/L (ref 3.5–5.1)
Sodium: 137 mEq/L (ref 135–145)

## 2013-04-14 NOTE — Care Management Note (Signed)
    Page 1 of 1   04/14/2013     2:16:57 PM   CARE MANAGEMENT NOTE 04/14/2013  Patient:  ZYAIRAH, WACHA   Account Number:  [id number]  Date Initiated:  04/14/2013  Documentation initiated by:  [facility]  Subjective/Objective Assessment:   admitted postop rt total hip arthroplasty     Action/Plan:   plan return home with HHPT   Anticipated DC Date:  04/16/2013   Anticipated DC Plan:  HOME W HOME HEALTH SERVICES      DC Planning Services  CM consult      Choice offered to / List presented to:          [facility] arranged  HH-2 PT      [facility] agency  [facility]   Status of service:  Completed, signed off Medicare Important Message given?   (If response is "NO", the following Medicare IM given date fields will be blank) Date Medicare IM given:   Date Additional Medicare IM given:    Discharge Disposition:  HOME W HOME HEALTH SERVICES  Per UR Regulation:    If discussed at Long Length of Stay Meetings, dates discussed:    Comments:  04/14/13 Patient set up with HHPT with Gordy Clement by MD office. Spoke with patient, no change in d/c plans. Patient lives with her husband and has daughter and grandchildren available for assistanceas well. She has a rolling walker and 3N1 at home. no equipment needs identified. Jacquelynn Cree RN, BSN, CCM

## 2013-04-14 NOTE — Op Note (Signed)
NAMEKYMBERLEY, Wilkerson                ACCOUNT NO.:  [id number]  MEDICAL RECORD NO.:  [id number]  LOCATION:  [id number]                        FACILITY:  [facility]  PHYSICIAN:  Vanita Panda. Magnus Ivan, M.D.DATE OF BIRTH:  [birth year]  DATE OF PROCEDURE:  04/13/2013 DATE OF DISCHARGE:                              OPERATIVE REPORT   PREOPERATIVE DIAGNOSES:  Severe end-stage arthritis and degenerative joint disease, right hip.  POSTOPERATIVE DIAGNOSES:  Severe end-stage arthritis and degenerative joint disease, right hip.  PROCEDURE:  Right total hip arthroplasty through direct anterior approach.  IMPLANTS:  DePuy Sector Gription acetabular component size 50, size 32+ 4 neutral polyethylene liner, size 9 Corail femoral component with varus offset (KLA), size 36+ 1 metal hip ball.  SURGEON:  Vanita Panda. Magnus Ivan, M.D.  ASSISTANT:  Richardean Canal, PA-C, who was present and assistance required throughout the entire case and to facilitate the case from opening from the patient positioning, opening the wound, retracting and closure.  ANESTHESIA:  General.  ANTIBIOTICS:  2 g of IV Ancef.  BLOOD LOSS:  400 mL.  COMPLICATIONS:  None.  INDICATIONS:  Carrie Wilkerson is a very pleasant 72 year old female with debilitating arthritis involving her right hip to the point where she could not even ambulate anymore due to the severity of her pain.  Given these factors, an x-rays that showed complete loss of the joint space, periarticular osteophytes and collapse of the femoral head.  It was recommended that she undergo a total hip arthroplasty.  The risks and benefits of the surgery were explained to her in detail and she did wish to proceed.  PROCEDURE DESCRIPTION:  After informed consent was obtained, appropriate right hip was marked.  She was brought to the operating room and general anesthesia was obtained while she was on her stretcher.  A Foley catheter was placed and then traction boots were  placed on both of her feet.  She was next placed supine on the Hana fracture table with perineal post in place and both legs were in inline skeletal traction, but no traction applied.  Her right operative hip was then prepped and draped with DuraPrep and sterile drapes.  Time-out was called and she was identified as correct patient and correct right hip.  I then made an incision just posterior and inferior to the anterior superior iliac spine and carried this obliquely down the leg.  I dissected down to the tensor fascia lata muscle and the tensor fascia was then divided longitudinally.  I proceeded with a direct anterior approach to the hip. A Cobra retractor was placed around the lateral neck and the medial neck, and I cauterized the lateral femoral circumflex vessels.  I made my femoral neck cut proximal to the lesser trochanter with an oscillating saw and completed this with an osteotome.  I then placed a corkscrew guide in the femoral head and removed the femoral head in its entirety and found to be devoid of cartilage.  I then cleaned the acetabulum and debris, and placed a bent Hohmann medially and a Cobra retractor laterally.  I removed remnants of the acetabular labrum and then began reaming from a size 42 reamer in 2 mm increments to  a size 50 reamer with all reamers placed under direct visualization and last reamer also placed under direct fluoroscopy.  So, I pretend my depth and reaming my inclination and anteversion.  Once I was pleased with this, I placed the real DePuy Sector Gription acetabular component, size 50, the apex hole eliminator guide, the real 32+ 4 neutral polyethylene liner. Attention was then turned to the femur with the leg externally rotated to about 90 degrees, extended and adducted.  I placed a Mueller retractor medially and a Hohmann behind the greater trochanter.  I released the lateral joint capsule and then used a box cutting osteotome, then the  femoral canal and a rongeur to lateralize the broach with just a size 8 broach and size 9 and the 9 was felt to be tight.  I calcar planed off of this and then trialed a varus neck.  I thought that this is going to be longer than 36+ 1 hip ball.  We brought this down in the acetabulum and it was stable on rotation.  Her leg lengths were near equal.  I then dislocated the hip and removed the trial components and then placed the real Corail femoral component size 9 with varus offset and the real 36+ 1 metal hip ball, reduced this back in the acetabulum and again it was stable.  We copiously irrigated the joint with normal saline solution using pulsatile lavage.  I then closed the joint capsule with interrupted #1 Ethibond suture followed by a running #1 Vicryl in the tensor fascia, 2-0 Vicryl in the deep and subcutaneous tissue, 4-0 Monocryl in subcuticular and Dermabond on the skin.  Aquacel dressing was applied.  She was then taken off the Hana table, awakened, extubated, and taken to the recovery room in stable condition.  All final counts were correct.  There were no complications noted.     Vanita Panda. Magnus Ivan, M.D.     CYB/MEDQ  D:  04/13/2013  T:  04/14/2013  Job:  [id number]

## 2013-04-14 NOTE — Progress Notes (Signed)
UR COMPLETED  

## 2013-04-14 NOTE — Progress Notes (Signed)
Physical Therapy Treatment Note  Progressing very well towards PT goals; On track for dc home   04/14/13 1353  PT Visit Information  Last PT Received On 04/14/13  Assistance Needed +1  History of Present Illness PT s/p direct anterior Rt. THA  PT Time Calculation  PT Start Time 1317  PT Stop Time 1341  PT Time Calculation (min) 24 min  Subjective Data  Subjective "I'd like to use the bathroom while you're here."  Patient Stated Goal To return home with husband  Precautions  Precautions Fall  Restrictions  Weight Bearing Restrictions Yes  RLE Weight Bearing WBAT  Cognition  Arousal/Alertness Awake/alert  Behavior During Therapy WFL for tasks assessed/performed  Overall Cognitive Status Within Functional Limits for tasks assessed  Bed Mobility  Bed Mobility Sit to Supine  Sit to Supine 4: Min assist  Details for Bed Mobility Assistance VCs for sequencing and hand placement on seated surface  Transfers  Transfers Sit to Stand;Stand to Sit  Sit to Stand 4: Min guard (CGA)  Stand to Sit 4: Min guard (CGA)  Details for Transfer Assistance VC's for hand placement and sequencing   Ambulation/Gait  Ambulation/Gait Assistance 4: Min guard  Ambulation Distance (Feet) 100 Feet  Assistive device Rolling walker  Ambulation/Gait Assistance Details Pt was cued for sequencing, WBAT status on RLE, and safety awareness with the RW  Gait Pattern Step-through pattern;Decreased weight shift to right;Decreased dorsiflexion - right;Decreased hip/knee flexion - right  Balance  Balance Assessed Yes  Static Standing Balance  Static Standing - Balance Support Bilateral upper extremity supported  Static Standing - Level of Assistance Other (comment) (CGA)  Static Standing - Comment/# of Minutes 1  Exercises  Exercises Total Joint  Total Joint Exercises  Ankle Circles/Pumps AROM;10 reps  Quad Sets AROM;10 reps  Towel Squeeze AROM;10 reps  Short Arc Quad AROM;10 reps  Heel Slides AAROM;10  reps  Hip ABduction/ADduction AAROM;10 reps  Straight Leg Raises AAROM;10 reps  Long Arc Quad AROM;10 reps  PT - End of Session  Activity Tolerance Patient limited by pain  Patient left in bed;with call bell/phone within reach;with bed alarm set;with nursing/sitter in room  PT - Assessment/Plan  PT Frequency Other (Comment) (2x/day)  Follow Up Recommendations Home health PT  PT equipment None recommended by PT  Acute Rehab PT Goals  PT Goal Formulation With patient  Time For Goal Achievement 04/28/13  Potential to Achieve Goals Good  PT General Charges  $$ ACUTE PT VISIT 1 Procedure  PT Treatments  $Gait Training 8-22 mins  $Therapeutic Exercise 8-22 mins   5/10 pain in R hip patient repositioned for comfort  This note was co-written with Ruthann Cancer, PT, who performed most of treatment session.  [place], [place] [phone]'

## 2013-04-14 NOTE — Evaluation (Signed)
Physical Therapy Evaluation Patient Details Name: Carrie Wilkerson MRN: [id number] DOB: 09/10/1941 Today's Date: 04/14/2013 Time: 1308-6578 PT Time Calculation (min): 25 min  PT Assessment / Plan / Recommendation History of Present Illness  PT s/p direct anterior Rt. THA  Clinical Impression  Pt is s/p THA resulting in the deficits listed below (see PT Problem List).  Pt will benefit from skilled PT to increase their independence and safety with mobility to allow discharge to the venue listed below.        PT Assessment  Patient needs continued PT services    Follow Up Recommendations  Home health PT    Does the patient have the potential to tolerate intense rehabilitation      Barriers to Discharge        Equipment Recommendations  None recommended by PT (Pt has RW at home)    Recommendations for Other Services     Frequency Other (Comment) (2x/day)    Precautions / Restrictions Precautions Precautions: Fall Restrictions Weight Bearing Restrictions: Yes RLE Weight Bearing: Weight bearing as tolerated   Pertinent Vitals/Pain Pt reports little pain at rest, but states "will be a big one" when she gets up.  Patient repositioned for comfort RN provided medication to assist with pain control       Mobility  Bed Mobility Bed Mobility: Supine to Sit Supine to Sit: 1: +2 Total assist Supine to Sit: Patient Percentage: 60% Sitting - Scoot to Edge of Bed: 4: Min assist Details for Bed Mobility Assistance: VCs for sequencing and hand placement on seated surface Transfers Transfers: Sit to Stand;Stand to Sit Sit to Stand: 4: Min assist Stand to Sit: 4: Min assist Details for Transfer Assistance: VC's for hand placement and sequencing  Ambulation/Gait Ambulation/Gait Assistance: 4: Min guard Ambulation Distance (Feet): 25 Feet Assistive device: Rolling walker Ambulation/Gait Assistance Details: VC's for sequencing, WBAT status on RLE, and safety awareness with the  walker Gait Pattern: Step-through pattern;Decreased weight shift to right;Decreased dorsiflexion - right;Decreased hip/knee flexion - right    Exercises     PT Diagnosis: Difficulty walking  PT Problem List: Decreased strength;Decreased range of motion;Decreased activity tolerance;Decreased mobility;Decreased balance;Pain PT Treatment Interventions: DME instruction;Gait training;Stair training;Functional mobility training;Therapeutic activities;Therapeutic exercise;Balance training;Patient/family education     PT Goals(Current goals can be found in the care plan section) Acute Rehab PT Goals Patient Stated Goal: To return home with husband PT Goal Formulation: With patient Time For Goal Achievement: 04/28/13 Potential to Achieve Goals: Good Additional Goals Additional Goal #1: Pt will be able to perform HEP independently   Visit Information  Last PT Received On: 04/14/13 Assistance Needed: +2 PT/OT Co-Evaluation/Treatment: Yes History of Present Illness: PT s/p direct anterior Rt. THA       Prior Functioning  Home Living Family/patient expects to be discharged to:: Private residence Living Arrangements: Spouse/significant other Available Help at Discharge: Family Type of Home: House Home Access: Stairs to enter Secretary/administrator of Steps: 2 Entrance Stairs-Rails: Left Home Layout: One level Home Equipment: Emergency planning/management officer - 2 wheels;Bedside commode Prior Function Level of Independence: Independent Communication Communication: No difficulties Dominant Hand: Right    Cognition  Cognition Arousal/Alertness: Awake/alert Behavior During Therapy: WFL for tasks assessed/performed Overall Cognitive Status: Within Functional Limits for tasks assessed    Extremity/Trunk Assessment Upper Extremity Assessment Upper Extremity Assessment: Overall WFL for tasks assessed Lower Extremity Assessment Lower Extremity Assessment: RLE deficits/detail (Strength and AROM deficits  consistent with THA noted)   Balance Balance Balance Assessed: Yes  Static Standing Balance Static Standing - Balance Support: Bilateral upper extremity supported Static Standing - Level of Assistance: Other (comment) (CGA) Static Standing - Comment/# of Minutes: 2  End of Session PT - End of Session Activity Tolerance: Patient limited by pain (in R hip and also R knee) Patient left: in chair;with call bell/phone within reach  GP   This note was co-written with Ruthann Cancer, PT  Van Clines Hamff 04/14/2013, 11:20 AM  Van Clines, PT [phone]

## 2013-04-14 NOTE — Progress Notes (Signed)
Subjective: 1 Day Post-Op Procedure(s) (LRB): RIGHT TOTAL HIP ARTHROPLASTY ANTERIOR APPROACH (Right) Patient reports pain as mild.  Asymptomatic acute blood loss anemia.  Objective: Vital signs in last 24 hours: Temp:  [97.4 F (36.3 C)-98.4 F (36.9 C)] 98.4 F (36.9 C) (07/09 4098) Pulse Rate:  [67-86] 86 (07/09 0608) Resp:  [9-18] 16 (07/09 0608) BP: (101-161)/(45-83) 101/49 mmHg (07/09 0608) SpO2:  [94 %-100 %] 94 % (07/09 0608)  Intake/Output from previous day: 07/08 0701 - 07/09 0700 In: 2182.5 [I.V.:1632.5; IV Piggyback:550] Out: 975 [Urine:575; Blood:400] Intake/Output this shift:     Recent Labs  04/13/13 1317 04/14/13 0535  HGB 12.8 9.7*    Recent Labs  04/13/13 1317 04/14/13 0535  WBC 5.3 5.9  RBC 4.15 3.09*  HCT 38.3 28.7*  PLT 239 188    Recent Labs  04/13/13 1317 04/14/13 0535  NA 141 137  K 4.2 4.2  CL 107 105  CO2 26 28  BUN 18 12  CREATININE 0.70 0.77  GLUCOSE 90 90  CALCIUM 9.2 8.0*   No results found for this basename: LABPT, INR,  in the last 72 hours  Neurovascular intact Sensation intact distally Intact pulses distally Dorsiflexion/Plantar flexion intact Incision: dressing C/D/I  Assessment/Plan: 1 Day Post-Op Procedure(s) (LRB): RIGHT TOTAL HIP ARTHROPLASTY ANTERIOR APPROACH (Right) Up with therapy  Shawny Borkowski Y 04/14/2013, 7:42 AM

## 2013-04-14 NOTE — Evaluation (Signed)
Occupational Therapy Evaluation Patient Details Name: Carrie Wilkerson MRN: [id number] DOB: 1940/11/12 Today's Date: 04/14/2013 Time: 4098-1191 OT Time Calculation (min): 27 min  OT Assessment / Plan / Recommendation History of present illness PT s/p direct anterior Rt. THA   Clinical Impression   This 71 yo female doing great on post op day 1. Will continue to benefit from acute OT with follow up HHOT.    OT Assessment  Patient needs continued OT Services    Follow Up Recommendations  Home health OT       Equipment Recommendations  None recommended by OT       Frequency  Min 2X/week    Precautions / Restrictions Precautions Precautions: Fall Restrictions Weight Bearing Restrictions: Yes RLE Weight Bearing: Weight bearing as tolerated   Pertinent Vitals/Pain 5/10 right hip; repositioned and made RN aware (in to give meds at beginning of session)    ADL  Eating/Feeding: Simulated;Independent Where Assessed - Eating/Feeding: Chair Grooming: Set up Where Assessed - Grooming: Supported sitting Upper Body Bathing: Simulated;Set up Where Assessed - Upper Body Bathing: Supported sitting Lower Body Bathing: Simulated;Moderate assistance Where Assessed - Lower Body Bathing: Supported sit to stand Upper Body Dressing: Simulated;Set up Where Assessed - Upper Body Dressing: Supported sitting Lower Body Dressing: Simulated;Maximal assistance Where Assessed - Lower Body Dressing: Supported sit to stand Toilet Transfer: Mining engineer Method: Sit to Barista:  (Bed (slightly raised)>down hallway with recliner behind her) Toileting - Architect and Hygiene: Simulated;Min guard Where Assessed - Engineer, mining and Hygiene: Standing Equipment Used: Rolling walker;Gait belt Transfers/Ambulation Related to ADLs: Min A for all    OT Diagnosis: Generalized weakness;Acute pain  OT Problem List: Decreased  strength;Decreased range of motion;Impaired balance (sitting and/or standing);Pain;Decreased knowledge of use of DME or AE OT Treatment Interventions: Self-care/ADL training;Patient/family education;Balance training;Therapeutic activities;DME and/or AE instruction   OT Goals(Current goals can be found in the care plan section) Acute Rehab OT Goals Patient Stated Goal: To be able to go home OT Goal Formulation: With patient Time For Goal Achievement: 04/21/13 Potential to Achieve Goals: Good ADL Goals Pt Will Perform Lower Body Bathing: with min assist;sit to/from stand;with adaptive equipment Pt Will Perform Lower Body Dressing: with min assist;with adaptive equipment;sit to/from stand Pt Will Transfer to Toilet: with supervision;ambulating;bedside commode (over toliet) Additional ADL Goal #1: Pt will be S for in and OOB for BADLs  Visit Information  Last OT Received On: 04/14/13 Assistance Needed: +2 PT/OT Co-Evaluation/Treatment: Yes History of Present Illness: PT s/p direct anterior Rt. THA       Prior Functioning     Home Living Family/patient expects to be discharged to:: Private residence Living Arrangements: Spouse/significant other Available Help at Discharge: Family Type of Home: House Home Access: Stairs to enter Secretary/administrator of Steps: 2 Entrance Stairs-Rails: Left Home Layout: One level Home Equipment: Emergency planning/management officer - 2 wheels;Bedside commode Prior Function Level of Independence: Independent Communication Communication: No difficulties Dominant Hand: Right         Vision/Perception Vision - History Baseline Vision: Wears glasses all the time Patient Visual Report: No change from baseline   Cognition  Cognition Arousal/Alertness: Awake/alert Behavior During Therapy: WFL for tasks assessed/performed Overall Cognitive Status: Within Functional Limits for tasks assessed    Extremity/Trunk Assessment Upper Extremity Assessment Upper  Extremity Assessment: Overall WFL for tasks assessed     Mobility Bed Mobility Bed Mobility: Supine to Sit;Sitting - Scoot to Edge of Bed Supine to  Sit: 1: +2 Total assist;HOB flat Supine to Sit: Patient Percentage: 60% Sitting - Scoot to Edge of Bed: 4: Min guard Details for Bed Mobility Assistance: VCs for sequencing Transfers Transfers: Sit to Stand;Stand to Sit Sit to Stand: 4: Min assist;With upper extremity assist;From bed Stand to Sit: 4: Min assist;With upper extremity assist;With armrests;To chair/3-in-1 Details for Transfer Assistance: VCs for safe hand placement           End of Session OT - End of Session Equipment Utilized During Treatment: Gait belt;Rolling walker Activity Tolerance: Patient tolerated treatment well Patient left: in chair;with call bell/phone within reach Nurse Communication: Mobility status (NT)       Evette Georges 04/14/2013, 10:20 AM

## 2013-04-15 ENCOUNTER — Encounter (HOSPITAL_COMMUNITY): Payer: Self-pay | Admitting: Orthopaedic Surgery

## 2013-04-15 LAB — CBC
Hemoglobin: 9.8 g/dL — ABNORMAL LOW (ref 12.0–15.0)
MCH: 31 pg (ref 26.0–34.0)
RBC: 3.16 MIL/uL — ABNORMAL LOW (ref 3.87–5.11)

## 2013-04-15 MED ORDER — ASPIRIN 325 MG PO TBEC: 325.0000 mg | DELAYED_RELEASE_TABLET | Freq: Two times a day (BID) | ORAL | Status: DC

## 2013-04-15 MED ORDER — OXYCODONE-ACETAMINOPHEN 5-325 MG PO TABS: 1.0000 | ORAL_TABLET | ORAL | Status: DC | PRN

## 2013-04-15 NOTE — Progress Notes (Signed)
Occupational Therapy Treatment and Discharge Patient Details Name: Carrie Wilkerson MRN: [id number] DOB: May 29, 1941 Today's Date: 04/15/2013 Time: 7829-5621 OT Time Calculation (min): 13 min  OT Assessment / Plan / Recommendation  OT comments  Pt making progress, although all goals are not met, all education has been completed that patient wanted to. Acute OT will sign off.  Follow Up Recommendations  No OT follow up       Equipment Recommendations  None recommended by OT       Frequency Min 2X/week   Progress towards OT Goals Progress towards OT goals: Progressing toward goals  Plan Discharge plan needs to be updated    Precautions / Restrictions Precautions Precautions: Fall Restrictions Weight Bearing Restrictions: No RLE Weight Bearing: Weight bearing as tolerated   Pertinent Vitals/Pain 1/10 with bending of hip from reclined in recliner to upright sitting    ADL  Lower Body Dressing: Performed;Set up;Supervision/safety (without AE) Where Assessed - Lower Body Dressing: Unsupported sit to stand ADL Comments: Pt declined practice to 3n1 in bathroom feels like she knows how to do this, also declined practice with tub transfer to seat with back.      OT Goals(current goals can now be found in the care plan section)    Visit Information  Last OT Received On: 04/15/13 Assistance Needed: +1 History of Present Illness: PT s/p direct anterior Rt. THA          Cognition  Cognition Arousal/Alertness: Awake/alert Behavior During Therapy: WFL for tasks assessed/performed Overall Cognitive Status: Within Functional Limits for tasks assessed             End of Session OT - End of Session Activity Tolerance: Patient tolerated treatment well Patient left: in chair       Evette Georges [phone] 04/15/2013, 11:34 AM

## 2013-04-15 NOTE — Progress Notes (Signed)
Physical Therapy Treatment Patient Details Name: Carrie Wilkerson MRN: [id number] DOB: 1940-12-31 Today's Date: 04/15/2013 Time: 4098-1191 PT Time Calculation (min): 29 min  PT Assessment / Plan / Recommendation  PT Comments   Pt progressing well towards physical therapy goals. Pt demonstrated increased tolerance for functional activity and ambulated 200 feet with RW, and was able to negotiate 4 steps ascending and descending. Pt reports increased stiffness after being in bed this afternoon, but was not limited in gait training or therapeutic exercise.   Follow Up Recommendations  Home health PT     Does the patient have the potential to tolerate intense rehabilitation     Barriers to Discharge        Equipment Recommendations  None recommended by PT (Pt has RW at home)    Recommendations for Other Services    Frequency 7X/week   Progress towards PT Goals Progress towards PT goals: Progressing toward goals  Plan Current plan remains appropriate    Precautions / Restrictions Precautions Precautions: Fall Restrictions Weight Bearing Restrictions: Yes RLE Weight Bearing: Weight bearing as tolerated   Pertinent Vitals/Pain Pt reports 5/10 pain during ambulation, and states it is "stiff"    Mobility  Bed Mobility Bed Mobility: Supine to Sit Supine to Sit: 4: Min assist Sitting - Scoot to Edge of Bed: 4: Min guard Sit to Supine: 4: Min assist Details for Bed Mobility Assistance: VCs for sequencing and hand placement on seated surface Transfers Transfers: Sit to Stand;Stand to Sit Sit to Stand: 4: Min guard Stand to Sit: 4: Min guard Details for Transfer Assistance: VC's for hand placement and safety awareness Ambulation/Gait Ambulation/Gait Assistance: 4: Min guard Ambulation Distance (Feet): 200 Feet Assistive device: Rolling walker Ambulation/Gait Assistance Details: Pt was cued for increased heel strike, step-through gait pattern, WBAT status, and fluidity of walker  movement. Gait Pattern: Step-through pattern;Decreased weight shift to right;Decreased dorsiflexion - right;Decreased hip/knee flexion - right Stairs: Yes Stair Management Technique: Two rails;Step to pattern Number of Stairs: 4    Exercises Total Joint Exercises Quad Sets: Other reps (comment) (12) Towel Squeeze: AROM (12 reps) Short Arc Quad: Other reps (comment) (12) Heel Slides: AAROM;Other reps (comment) (12) Hip ABduction/ADduction: AAROM;Other reps (comment)   PT Diagnosis:    PT Problem List:   PT Treatment Interventions:     PT Goals (current goals can now be found in the care plan section) Acute Rehab PT Goals Patient Stated Goal: To return home with husband PT Goal Formulation: With patient Time For Goal Achievement: 04/28/13 Potential to Achieve Goals: Good  Visit Information  Last PT Received On: 04/15/13 Assistance Needed: +1 History of Present Illness: PT s/p direct anterior Rt. THA    Subjective Data  Subjective: "I think I'm a little stiff from my nap. It seems to be getting better the more I walk though." Patient Stated Goal: To return home with husband   Cognition  Cognition Arousal/Alertness: Awake/alert Behavior During Therapy: WFL for tasks assessed/performed Overall Cognitive Status: Within Functional Limits for tasks assessed    Balance  Balance Balance Assessed: No Dynamic Standing Balance Dynamic Standing - Comments: Pt was able to balance without UE support at commode and clean up/fix gown when finished, utilizing trunk rotation, reaching, and weight shifting on LE's  End of Session PT - End of Session Equipment Utilized During Treatment: Gait belt Activity Tolerance: Patient tolerated treatment well Patient left: in bed;with call bell/phone within reach   GP     Ruthann Cancer, PT 04/15/2013,  3:04 PM

## 2013-04-15 NOTE — Progress Notes (Signed)
Physical Therapy Treatment Patient Details Name: Carrie Wilkerson MRN: [id number] DOB: [birth year] Today's Date: 04/15/2013 Time: 4098-1191 PT Time Calculation (min): 25 min  PT Assessment / Plan / Recommendation  PT Comments   Pt progressing well towards physical therapy goals. Pt was able to improve ambulation distance, and also showed improvement with AROM and technique during therapeutic exercise. Pt able to negotiate walker in restroom and appeared safe and steady during toileting. Pt remains appropriate for d/c to home setting with HHPT to follow.  Follow Up Recommendations  Home health PT     Does the patient have the potential to tolerate intense rehabilitation     Barriers to Discharge        Equipment Recommendations  Other (comment);None recommended by PT (Pt has RW at home)    Recommendations for Other Services    Frequency 7X/week   Progress towards PT Goals Progress towards PT goals: Progressing toward goals  Plan Current plan remains appropriate    Precautions / Restrictions Precautions Precautions: Fall Restrictions Weight Bearing Restrictions: Yes RLE Weight Bearing: Weight bearing as tolerated   Pertinent Vitals/Pain Pt reports little pain throughout session, and states she feels a "pulling" during therapeutic exercise, but no pain.    Mobility  Bed Mobility Bed Mobility: Supine to Sit Supine to Sit: 4: Min guard Sitting - Scoot to Edge of Bed: 5: Supervision Details for Bed Mobility Assistance: VCs for sequencing and hand placement on seated surface Transfers Transfers: Sit to Stand;Stand to Sit Sit to Stand: 4: Min guard Stand to Sit: 4: Min guard Details for Transfer Assistance: VC's for hand placement and safety awareness Ambulation/Gait Ambulation/Gait Assistance: 4: Min guard Ambulation Distance (Feet): 125 Feet Assistive device: Rolling walker Ambulation/Gait Assistance Details: Pt was cued for increased heel strike, step-through gait pattern, and  WBAT status Gait Pattern: Step-through pattern;Decreased weight shift to right;Decreased dorsiflexion - right;Decreased hip/knee flexion - right    Exercises Total Joint Exercises Quad Sets: AROM (12 reps) Towel Squeeze: AROM (12 reps) Short Arc Quad: AROM (12 reps) Heel Slides: AAROM (12 reps) Hip ABduction/ADduction: AAROM (12 reps)   PT Diagnosis:    PT Problem List:   PT Treatment Interventions:     PT Goals (current goals can now be found in the care plan section) Acute Rehab PT Goals Patient Stated Goal: To return home with husband PT Goal Formulation: With patient Time For Goal Achievement: 04/28/13 Potential to Achieve Goals: Good  Visit Information  Last PT Received On: 04/15/13 Assistance Needed: +1 History of Present Illness: PT s/p direct anterior Rt. THA    Subjective Data  Subjective: "I sat on the edge of the bed this morning and gave myself a bath." Patient Stated Goal: To return home with husband   Cognition  Cognition Arousal/Alertness: Awake/alert Behavior During Therapy: WFL for tasks assessed/performed Overall Cognitive Status: Within Functional Limits for tasks assessed    Balance  Balance Balance Assessed: Yes Dynamic Standing Balance Dynamic Standing - Comments: Pt was able to balance without UE support at commode and clean up/fix gown when finished, utilizing trunk rotation, reaching, and weight shifting on LE's  End of Session PT - End of Session Activity Tolerance: Patient tolerated treatment well Patient left: in chair;with call bell/phone within reach   GP     Ruthann Cancer 04/15/2013, 12:00 PM  Ruthann Cancer, PT

## 2013-04-15 NOTE — Progress Notes (Signed)
Subjective: 2 Days Post-Op Procedure(s) (LRB): RIGHT TOTAL HIP ARTHROPLASTY ANTERIOR APPROACH (Right) Patient reports pain as mild.  No acute changes.  Working well with therapy.  Objective: Vital signs in last 24 hours: Temp:  [98 F (36.7 C)-100.4 F (38 C)] 100.1 F (37.8 C) (07/10 0611) Pulse Rate:  [55-99] 55 (07/10 0611) Resp:  [14-16] 16 (07/10 0611) BP: (93-122)/(42-61) 93/61 mmHg (07/10 0611) SpO2:  [93 %-96 %] 96 % (07/10 0611)  Intake/Output from previous day: 07/09 0701 - 07/10 0700 In: 840 [P.O.:840] Out: 300 [Urine:300] Intake/Output this shift: Total I/O In: 360 [P.O.:360] Out: -    Recent Labs  04/13/13 1317 04/14/13 0535 04/15/13 0526  HGB 12.8 9.7* 9.8*    Recent Labs  04/14/13 0535 04/15/13 0526  WBC 5.9 6.7  RBC 3.09* 3.16*  HCT 28.7* 29.3*  PLT 188 169    Recent Labs  04/13/13 1317 04/14/13 0535  NA 141 137  K 4.2 4.2  CL 107 105  CO2 26 28  BUN 18 12  CREATININE 0.70 0.77  GLUCOSE 90 90  CALCIUM 9.2 8.0*   No results found for this basename: LABPT, INR,  in the last 72 hours  Sensation intact distally Intact pulses distally Dorsiflexion/Plantar flexion intact Incision: dressing C/D/I  Assessment/Plan: 2 Days Post-Op Procedure(s) (LRB): RIGHT TOTAL HIP ARTHROPLASTY ANTERIOR APPROACH (Right) Up with therapy Plan for discharge tomorrow  Carrie Wilkerson 04/15/2013, 6:51 AM

## 2013-04-16 LAB — CBC
HCT: 26.7 % — ABNORMAL LOW (ref 36.0–46.0)
Hemoglobin: 9.1 g/dL — ABNORMAL LOW (ref 12.0–15.0)
MCV: 92.7 fL (ref 78.0–100.0)
RDW: 13.7 % (ref 11.5–15.5)
WBC: 7.3 10*3/uL (ref 4.0–10.5)

## 2013-04-16 NOTE — Discharge Summary (Signed)
Patient ID: Carrie Wilkerson MRN: [id number] DOB/AGE: 1941/08/18 72 y.o.  Admit date: 04/13/2013 Discharge date: 04/16/2013  Admission Diagnoses:  Principal Problem:   Degenerative arthritis of hip   Discharge Diagnoses:  Same  Past Medical History  Diagnosis Date  . Cancer     Hx: of breast cancer in 2007  . Pneumonia     Hx: of 'a long time ago"  . Numbness in right leg     Hx: of  . Arthritis     Surgeries: Procedure(s): RIGHT TOTAL HIP ARTHROPLASTY ANTERIOR APPROACH on 04/13/2013   Consultants:    Discharged Condition: Improved  Hospital Course: Carrie Wilkerson is an 72 y.o. female who was admitted 04/13/2013 for operative treatment ofDegenerative arthritis of hip. Patient has severe unremitting pain that affects sleep, daily activities, and work/hobbies. After pre-op clearance the patient was taken to the operating room on 04/13/2013 and underwent  Procedure(s): RIGHT TOTAL HIP ARTHROPLASTY ANTERIOR APPROACH.    Patient was given perioperative antibiotics: Anti-infectives   Start     Dose/Rate Route Frequency Ordered Stop   04/13/13 2200  ceFAZolin (ANCEF) IVPB 1 g/50 mL premix     1 g 100 mL/hr over 30 Minutes Intravenous Every 6 hours 04/13/13 1904 04/14/13 0430   04/13/13 0600  ceFAZolin (ANCEF) IVPB 2 g/50 mL premix     2 g 100 mL/hr over 30 Minutes Intravenous On call to O.R. 04/12/13 1443 04/13/13 1546       Patient was given sequential compression devices, early ambulation, and chemoprophylaxis to prevent DVT.  Patient benefited maximally from hospital stay and there were no complications.    Recent vital signs: Patient Vitals for the past 24 hrs:  BP Temp Pulse Resp SpO2  04/16/13 0504 118/50 mmHg 98.5 F (36.9 C) 94 16 95 %  04/15/13 2045 111/53 mmHg 98.4 F (36.9 C) 98 16 96 %  04/15/13 1403 80/40 mmHg 98.5 F (36.9 C) 98 16 99 %  04/15/13 1200 - - - 16 97 %  04/15/13 0800 - - - 18 97 %     Recent laboratory studies:  Recent Labs  04/13/13 1317  04/14/13 0535 04/15/13 0526 04/16/13 0540  WBC 5.3 5.9 6.7 PENDING  HGB 12.8 9.7* 9.8* 9.1*  HCT 38.3 28.7* 29.3* 26.7*  PLT 239 188 169 181  NA 141 137  --   --   K 4.2 4.2  --   --   CL 107 105  --   --   CO2 26 28  --   --   BUN 18 12  --   --   CREATININE 0.70 0.77  --   --   GLUCOSE 90 90  --   --   CALCIUM 9.2 8.0*  --   --      Discharge Medications:     Medication List    STOP taking these medications       aspirin 81 MG chewable tablet  Replaced by:  aspirin 325 MG EC tablet      TAKE these medications       alendronate 70 MG tablet  Commonly known as:  FOSAMAX  Take 70 mg by mouth every 7 (seven) days. Take with a full glass of water on an empty stomach.     aspirin 325 MG EC tablet  Take 1 tablet (325 mg total) by mouth 2 (two) times daily after a meal.     CALCIUM PO  Take 1 tablet by mouth  daily.     cholecalciferol 1000 UNITS tablet  Commonly known as:  VITAMIN D  Take 1,000 Units by mouth daily.     Flaxseed (Linseed) Oil  Take 1 capsule by mouth daily.     glucosamine-chondroitin 500-400 MG tablet  Take 1 tablet by mouth 3 (three) times daily.     multivitamin with minerals Tabs  Take 1 tablet by mouth daily.     oxyCODONE-acetaminophen 5-325 MG per tablet  Commonly known as:  PERCOCET/ROXICET  Take 1-2 tablets by mouth every 4 (four) hours as needed for pain. For pain     PRESCRIPTION MEDICATION  Take 1 tablet by mouth daily. Prescription estrogen blocker        Diagnostic Studies: Dg Hip Operative Right  May 12, 2013   *RADIOLOGY REPORT*  Clinical Data: Anterior right total hip arthroplasty.  DG OPERATIVE RIGHT HIP  Comparison: 03/13/2013  Findings: Additional image demonstrates advanced degenerative joint disease changes within the right hip.  Subsequent spot imaging demonstrates changes of right hip replacement.  Normal AP alignment.  No hardware complicating feature visualized.  IMPRESSION: Right knee replacement.  No visible  complicating feature.   Original Report Authenticated By: Charlett Nose, M.D.   Dg Pelvis Portable  2013/05/12   *RADIOLOGY REPORT*  Clinical Data: Postop right hip arthroplasty  PORTABLE PELVIS  Comparison: 04/02/2013.  Findings: Right total hip arthroplasty has been performed.  The hardware components are in anatomic alignment.  No fracture or subluxation identified.  Moderate osteoarthritis involves the left hip.  IMPRESSION:  1.  No complications after right hip arthroplasty.   Original Report Authenticated By: Signa Kell, M.D.   Dg Hip Portable 1 View Right  05/12/13   *RADIOLOGY REPORT*  Clinical Data: Postop right hip  PORTABLE RIGHT HIP - 1 VIEW  Comparison: 05/12/2013  Findings: Postoperative change from right total hip arthroplasty identified.  The hardware components are in anatomic alignment.  No periprosthetic fracture or subluxation.  No radio-opaque foreign body or soft tissue calcification.  IMPRESSION:  1.  Status post right hip arthroplasty.   Original Report Authenticated By: Signa Kell, M.D.    Disposition:  To home      Discharge Orders   Future Orders Complete By Expires     Call MD / Call 911  As directed     Comments:      If you experience chest pain or shortness of breath, CALL 911 and be transported to the hospital emergency room.  If you develope a fever above 101 F, pus (white drainage) or increased drainage or redness at the wound, or calf pain, call your surgeon's office.    Constipation Prevention  As directed     Comments:      Drink plenty of fluids.  Prune juice may be helpful.  You may use a stool softener, such as Colace (over the counter) 100 mg twice a day.  Use MiraLax (over the counter) for constipation as needed.    Diet - low sodium heart healthy  As directed     Discharge instructions  As directed     Comments:      Increase your activities as comfort allows. You can get your actual right hip dressing wet in the shower. You should remove your  dressing starting 7/15 and can get your actual incision wet daily at that time. Starting 7/15 place new dry dressing daily.    Discharge patient  As directed     Discharge wound care:  As directed  Comments:      Keep dressing clean dry and intact until Wednesday then remove dressing and shower. Apply clean dressing after showering    Increase activity slowly as tolerated  As directed     Weight bearing as tolerated  As directed        Follow-up Information   Follow up with Kathryne Hitch, MD. Schedule an appointment as soon as possible for a visit in 2 weeks.   Contact information:   [address] [place] [address] [phone]        Signed: Kathryne Hitch 04/16/2013, 6:50 AM

## 2013-04-16 NOTE — Progress Notes (Signed)
RN reviewed discharge instructions with patient and family. All questions answered. Prescriptions given. Patient informed NOT to take aspirin 81mg , only to take aspirin 325 mg twice daily per MD instructions.   Patient wheeled to family car with RN.

## 2013-04-16 NOTE — Progress Notes (Signed)
Patient ID: Carrie Wilkerson, female   DOB: [birth year], 72 y.o.   MRN: [id number] Looks good overall.  AF/VSS.  Can D/C to home today.

## 2013-04-16 NOTE — Progress Notes (Signed)
Physical Therapy Treatment Patient Details Name: Carrie Wilkerson MRN: [id number] DOB: [birth year] Today's Date: 04/16/2013 Time: 0835-0900 PT Time Calculation (min): 25 min  PT Assessment / Plan / Recommendation  PT Comments   Pt is progressing well towards all PT goals, and demonstrated increased safety and independence with transfers and ambulation today. Pt was able to negotiate 5 steps ascending and descending with minimal cueing, and showed stability and confidence in the task. At this time, pt continues to be appropriate for d/c to home with HHPT to follow.  Follow Up Recommendations  Home health PT     Does the patient have the potential to tolerate intense rehabilitation     Barriers to Discharge        Equipment Recommendations  None recommended by PT (Pt has RW at home)    Recommendations for Other Services    Frequency 7X/week   Progress towards PT Goals Progress towards PT goals: Progressing toward goals  Plan Current plan remains appropriate    Precautions / Restrictions Precautions Precautions: Fall Restrictions Weight Bearing Restrictions: Yes RLE Weight Bearing: Weight bearing as tolerated   Pertinent Vitals/Pain Pt reports 4/10 pain at beginning of ambulation, however reports that pain decreases as gait training progresses.    Mobility  Bed Mobility Bed Mobility: Sit to Supine Sitting - Scoot to Edge of Bed: 5: Supervision Sit to Supine: 4: Min assist Transfers Transfers: Sit to Stand;Stand to Sit Sit to Stand: 6: Modified independent (Device/Increase time) Stand to Sit: 6: Modified independent (Device/Increase time) Details for Transfer Assistance: Pt demonstrated proper hand placement and safety awareness with the RW Ambulation/Gait Ambulation/Gait Assistance: 6: Modified independent (Device/Increase time) Ambulation Distance (Feet): 200 Feet Assistive device: Rolling walker Ambulation/Gait Assistance Details: Pt was cued for increased heel strike,  fluidity of walker movement, and increased step/stride length on L. Gait Pattern: Step-through pattern;Decreased step length - left;Decreased dorsiflexion - right Stairs: Yes Stairs Assistance: 6: Modified independent (Device/Increase time) Stair Management Technique: Two rails Number of Stairs: 5    Exercises Total Joint Exercises Ankle Circles/Pumps: 15 reps;AROM Quad Sets: 15 reps Towel Squeeze: 15 reps Short Arc Quad: 15 reps;AROM Heel Slides: 15 reps;AROM Hip ABduction/ADduction: 15 reps;AAROM Long Arc Quad: 15 reps;AROM   PT Diagnosis:    PT Problem List:   PT Treatment Interventions:     PT Goals (current goals can now be found in the care plan section) Acute Rehab PT Goals Patient Stated Goal: To return home with husband PT Goal Formulation: With patient Time For Goal Achievement: 04/28/13 Potential to Achieve Goals: Good  Visit Information  Last PT Received On: 04/16/13 Assistance Needed: +1 History of Present Illness: PT s/p direct anterior Rt. THA    Subjective Data  Subjective: "I feel much better today." Patient Stated Goal: To return home with husband   Cognition  Cognition Arousal/Alertness: Awake/alert Behavior During Therapy: WFL for tasks assessed/performed Overall Cognitive Status: Within Functional Limits for tasks assessed    Balance     End of Session PT - End of Session Equipment Utilized During Treatment: Gait belt Activity Tolerance: Patient tolerated treatment well Patient left: in bed;with call bell/phone within reach   GP     Ruthann Cancer 04/16/2013, 9:59 AM  Ruthann Cancer, PT

## 2013-04-17 DIAGNOSIS — Z96649 Presence of unspecified artificial hip joint: Secondary | ICD-10-CM | POA: Diagnosis not present

## 2013-04-17 DIAGNOSIS — IMO0001 Reserved for inherently not codable concepts without codable children: Secondary | ICD-10-CM | POA: Diagnosis not present

## 2013-04-17 DIAGNOSIS — I10 Essential (primary) hypertension: Secondary | ICD-10-CM | POA: Diagnosis not present

## 2013-04-17 DIAGNOSIS — Z471 Aftercare following joint replacement surgery: Secondary | ICD-10-CM | POA: Diagnosis not present

## 2013-04-19 DIAGNOSIS — IMO0001 Reserved for inherently not codable concepts without codable children: Secondary | ICD-10-CM | POA: Diagnosis not present

## 2013-04-19 DIAGNOSIS — I10 Essential (primary) hypertension: Secondary | ICD-10-CM | POA: Diagnosis not present

## 2013-04-19 DIAGNOSIS — Z471 Aftercare following joint replacement surgery: Secondary | ICD-10-CM | POA: Diagnosis not present

## 2013-04-19 DIAGNOSIS — Z96649 Presence of unspecified artificial hip joint: Secondary | ICD-10-CM | POA: Diagnosis not present

## 2013-04-21 DIAGNOSIS — I10 Essential (primary) hypertension: Secondary | ICD-10-CM | POA: Diagnosis not present

## 2013-04-21 DIAGNOSIS — IMO0001 Reserved for inherently not codable concepts without codable children: Secondary | ICD-10-CM | POA: Diagnosis not present

## 2013-04-21 DIAGNOSIS — Z96649 Presence of unspecified artificial hip joint: Secondary | ICD-10-CM | POA: Diagnosis not present

## 2013-04-21 DIAGNOSIS — Z471 Aftercare following joint replacement surgery: Secondary | ICD-10-CM | POA: Diagnosis not present

## 2013-04-23 DIAGNOSIS — I10 Essential (primary) hypertension: Secondary | ICD-10-CM | POA: Diagnosis not present

## 2013-04-23 DIAGNOSIS — IMO0001 Reserved for inherently not codable concepts without codable children: Secondary | ICD-10-CM | POA: Diagnosis not present

## 2013-04-23 DIAGNOSIS — Z471 Aftercare following joint replacement surgery: Secondary | ICD-10-CM | POA: Diagnosis not present

## 2013-04-23 DIAGNOSIS — Z96649 Presence of unspecified artificial hip joint: Secondary | ICD-10-CM | POA: Diagnosis not present

## 2013-04-26 DIAGNOSIS — I10 Essential (primary) hypertension: Secondary | ICD-10-CM | POA: Diagnosis not present

## 2013-04-26 DIAGNOSIS — Z96649 Presence of unspecified artificial hip joint: Secondary | ICD-10-CM | POA: Diagnosis not present

## 2013-04-26 DIAGNOSIS — Z471 Aftercare following joint replacement surgery: Secondary | ICD-10-CM | POA: Diagnosis not present

## 2013-04-26 DIAGNOSIS — IMO0001 Reserved for inherently not codable concepts without codable children: Secondary | ICD-10-CM | POA: Diagnosis not present

## 2013-04-28 DIAGNOSIS — Z96649 Presence of unspecified artificial hip joint: Secondary | ICD-10-CM | POA: Diagnosis not present

## 2013-04-28 DIAGNOSIS — I10 Essential (primary) hypertension: Secondary | ICD-10-CM | POA: Diagnosis not present

## 2013-04-28 DIAGNOSIS — M169 Osteoarthritis of hip, unspecified: Secondary | ICD-10-CM | POA: Diagnosis not present

## 2013-04-28 DIAGNOSIS — IMO0001 Reserved for inherently not codable concepts without codable children: Secondary | ICD-10-CM | POA: Diagnosis not present

## 2013-04-28 DIAGNOSIS — M25559 Pain in unspecified hip: Secondary | ICD-10-CM | POA: Diagnosis not present

## 2013-04-28 DIAGNOSIS — Z471 Aftercare following joint replacement surgery: Secondary | ICD-10-CM | POA: Diagnosis not present

## 2013-04-30 DIAGNOSIS — Z471 Aftercare following joint replacement surgery: Secondary | ICD-10-CM | POA: Diagnosis not present

## 2013-04-30 DIAGNOSIS — IMO0001 Reserved for inherently not codable concepts without codable children: Secondary | ICD-10-CM | POA: Diagnosis not present

## 2013-04-30 DIAGNOSIS — I10 Essential (primary) hypertension: Secondary | ICD-10-CM | POA: Diagnosis not present

## 2013-04-30 DIAGNOSIS — Z96649 Presence of unspecified artificial hip joint: Secondary | ICD-10-CM | POA: Diagnosis not present

## 2013-05-03 DIAGNOSIS — I10 Essential (primary) hypertension: Secondary | ICD-10-CM | POA: Diagnosis not present

## 2013-05-03 DIAGNOSIS — Z471 Aftercare following joint replacement surgery: Secondary | ICD-10-CM | POA: Diagnosis not present

## 2013-05-03 DIAGNOSIS — Z96649 Presence of unspecified artificial hip joint: Secondary | ICD-10-CM | POA: Diagnosis not present

## 2013-05-03 DIAGNOSIS — IMO0001 Reserved for inherently not codable concepts without codable children: Secondary | ICD-10-CM | POA: Diagnosis not present

## 2013-05-05 DIAGNOSIS — I10 Essential (primary) hypertension: Secondary | ICD-10-CM | POA: Diagnosis not present

## 2013-05-05 DIAGNOSIS — Z96649 Presence of unspecified artificial hip joint: Secondary | ICD-10-CM | POA: Diagnosis not present

## 2013-05-05 DIAGNOSIS — Z471 Aftercare following joint replacement surgery: Secondary | ICD-10-CM | POA: Diagnosis not present

## 2013-05-05 DIAGNOSIS — IMO0001 Reserved for inherently not codable concepts without codable children: Secondary | ICD-10-CM | POA: Diagnosis not present

## 2013-05-07 DIAGNOSIS — Z471 Aftercare following joint replacement surgery: Secondary | ICD-10-CM | POA: Diagnosis not present

## 2013-05-07 DIAGNOSIS — I10 Essential (primary) hypertension: Secondary | ICD-10-CM | POA: Diagnosis not present

## 2013-05-07 DIAGNOSIS — Z96649 Presence of unspecified artificial hip joint: Secondary | ICD-10-CM | POA: Diagnosis not present

## 2013-05-07 DIAGNOSIS — IMO0001 Reserved for inherently not codable concepts without codable children: Secondary | ICD-10-CM | POA: Diagnosis not present

## 2013-05-11 DIAGNOSIS — I10 Essential (primary) hypertension: Secondary | ICD-10-CM | POA: Diagnosis not present

## 2013-05-11 DIAGNOSIS — IMO0001 Reserved for inherently not codable concepts without codable children: Secondary | ICD-10-CM | POA: Diagnosis not present

## 2013-05-11 DIAGNOSIS — Z471 Aftercare following joint replacement surgery: Secondary | ICD-10-CM | POA: Diagnosis not present

## 2013-05-11 DIAGNOSIS — Z96649 Presence of unspecified artificial hip joint: Secondary | ICD-10-CM | POA: Diagnosis not present

## 2013-05-12 DIAGNOSIS — Z96649 Presence of unspecified artificial hip joint: Secondary | ICD-10-CM | POA: Diagnosis not present

## 2013-05-12 DIAGNOSIS — I10 Essential (primary) hypertension: Secondary | ICD-10-CM | POA: Diagnosis not present

## 2013-05-12 DIAGNOSIS — Z471 Aftercare following joint replacement surgery: Secondary | ICD-10-CM | POA: Diagnosis not present

## 2013-05-12 DIAGNOSIS — IMO0001 Reserved for inherently not codable concepts without codable children: Secondary | ICD-10-CM | POA: Diagnosis not present

## 2013-05-13 DIAGNOSIS — Z471 Aftercare following joint replacement surgery: Secondary | ICD-10-CM | POA: Diagnosis not present

## 2013-05-13 DIAGNOSIS — I10 Essential (primary) hypertension: Secondary | ICD-10-CM | POA: Diagnosis not present

## 2013-05-13 DIAGNOSIS — IMO0001 Reserved for inherently not codable concepts without codable children: Secondary | ICD-10-CM | POA: Diagnosis not present

## 2013-05-13 DIAGNOSIS — Z96649 Presence of unspecified artificial hip joint: Secondary | ICD-10-CM | POA: Diagnosis not present

## 2013-05-24 DIAGNOSIS — Z1231 Encounter for screening mammogram for malignant neoplasm of breast: Secondary | ICD-10-CM | POA: Diagnosis not present

## 2013-05-27 DIAGNOSIS — Z853 Personal history of malignant neoplasm of breast: Secondary | ICD-10-CM | POA: Diagnosis not present

## 2013-07-21 DIAGNOSIS — IMO0002 Reserved for concepts with insufficient information to code with codable children: Secondary | ICD-10-CM | POA: Diagnosis not present

## 2013-07-21 DIAGNOSIS — M169 Osteoarthritis of hip, unspecified: Secondary | ICD-10-CM | POA: Diagnosis not present

## 2013-07-22 DIAGNOSIS — Z23 Encounter for immunization: Secondary | ICD-10-CM | POA: Diagnosis not present

## 2013-08-04 DIAGNOSIS — IMO0002 Reserved for concepts with insufficient information to code with codable children: Secondary | ICD-10-CM | POA: Diagnosis not present

## 2013-08-04 DIAGNOSIS — M169 Osteoarthritis of hip, unspecified: Secondary | ICD-10-CM | POA: Diagnosis not present

## 2013-10-20 DIAGNOSIS — M1610 Unilateral primary osteoarthritis, unspecified hip: Secondary | ICD-10-CM | POA: Diagnosis not present

## 2013-10-20 DIAGNOSIS — M169 Osteoarthritis of hip, unspecified: Secondary | ICD-10-CM | POA: Diagnosis not present

## 2014-03-16 DIAGNOSIS — M25519 Pain in unspecified shoulder: Secondary | ICD-10-CM | POA: Diagnosis not present

## 2014-03-16 DIAGNOSIS — Z853 Personal history of malignant neoplasm of breast: Secondary | ICD-10-CM | POA: Diagnosis not present

## 2014-03-16 DIAGNOSIS — M542 Cervicalgia: Secondary | ICD-10-CM | POA: Diagnosis not present

## 2014-03-16 DIAGNOSIS — M810 Age-related osteoporosis without current pathological fracture: Secondary | ICD-10-CM | POA: Diagnosis not present

## 2014-03-16 DIAGNOSIS — Z0000 Encounter for general adult medical examination without abnormal findings: Secondary | ICD-10-CM | POA: Diagnosis not present

## 2014-03-16 DIAGNOSIS — E785 Hyperlipidemia, unspecified: Secondary | ICD-10-CM | POA: Diagnosis not present

## 2014-03-18 DIAGNOSIS — M25519 Pain in unspecified shoulder: Secondary | ICD-10-CM | POA: Diagnosis not present

## 2014-03-18 DIAGNOSIS — M5030 Other cervical disc degeneration, unspecified cervical region: Secondary | ICD-10-CM | POA: Diagnosis not present

## 2014-04-01 DIAGNOSIS — M949 Disorder of cartilage, unspecified: Secondary | ICD-10-CM | POA: Diagnosis not present

## 2014-04-01 DIAGNOSIS — M899 Disorder of bone, unspecified: Secondary | ICD-10-CM | POA: Diagnosis not present

## 2014-05-30 DIAGNOSIS — Z1231 Encounter for screening mammogram for malignant neoplasm of breast: Secondary | ICD-10-CM | POA: Diagnosis not present

## 2014-06-02 DIAGNOSIS — Z853 Personal history of malignant neoplasm of breast: Secondary | ICD-10-CM | POA: Diagnosis not present

## 2014-06-02 DIAGNOSIS — Z09 Encounter for follow-up examination after completed treatment for conditions other than malignant neoplasm: Secondary | ICD-10-CM | POA: Diagnosis not present

## 2014-06-15 DIAGNOSIS — C50919 Malignant neoplasm of unspecified site of unspecified female breast: Secondary | ICD-10-CM | POA: Diagnosis not present

## 2014-06-15 DIAGNOSIS — M899 Disorder of bone, unspecified: Secondary | ICD-10-CM | POA: Diagnosis not present

## 2014-06-15 DIAGNOSIS — M949 Disorder of cartilage, unspecified: Secondary | ICD-10-CM | POA: Diagnosis not present

## 2014-06-24 DIAGNOSIS — H353190 Nonexudative age-related macular degeneration, unspecified eye, stage unspecified: Secondary | ICD-10-CM | POA: Diagnosis not present

## 2014-06-24 DIAGNOSIS — H2589 Other age-related cataract: Secondary | ICD-10-CM | POA: Diagnosis not present

## 2014-06-24 DIAGNOSIS — H524 Presbyopia: Secondary | ICD-10-CM | POA: Diagnosis not present

## 2014-06-24 DIAGNOSIS — H5200 Hypermetropia, unspecified eye: Secondary | ICD-10-CM | POA: Diagnosis not present

## 2014-06-24 DIAGNOSIS — H52229 Regular astigmatism, unspecified eye: Secondary | ICD-10-CM | POA: Diagnosis not present

## 2014-07-25 DIAGNOSIS — Z23 Encounter for immunization: Secondary | ICD-10-CM | POA: Diagnosis not present

## 2014-12-12 DIAGNOSIS — Z170 Estrogen receptor positive status [ER+]: Secondary | ICD-10-CM | POA: Diagnosis not present

## 2014-12-12 DIAGNOSIS — C50912 Malignant neoplasm of unspecified site of left female breast: Secondary | ICD-10-CM | POA: Diagnosis not present

## 2014-12-12 DIAGNOSIS — M810 Age-related osteoporosis without current pathological fracture: Secondary | ICD-10-CM | POA: Diagnosis not present

## 2015-02-14 IMAGING — CR DG HIP 1V PORT*R*
1 series · 1 of 1 positions shown · non-contrast
Comparison: 04/13/2013

CLINICAL DATA: Postop right hip

PORTABLE RIGHT HIP - 1 VIEW

[AP]
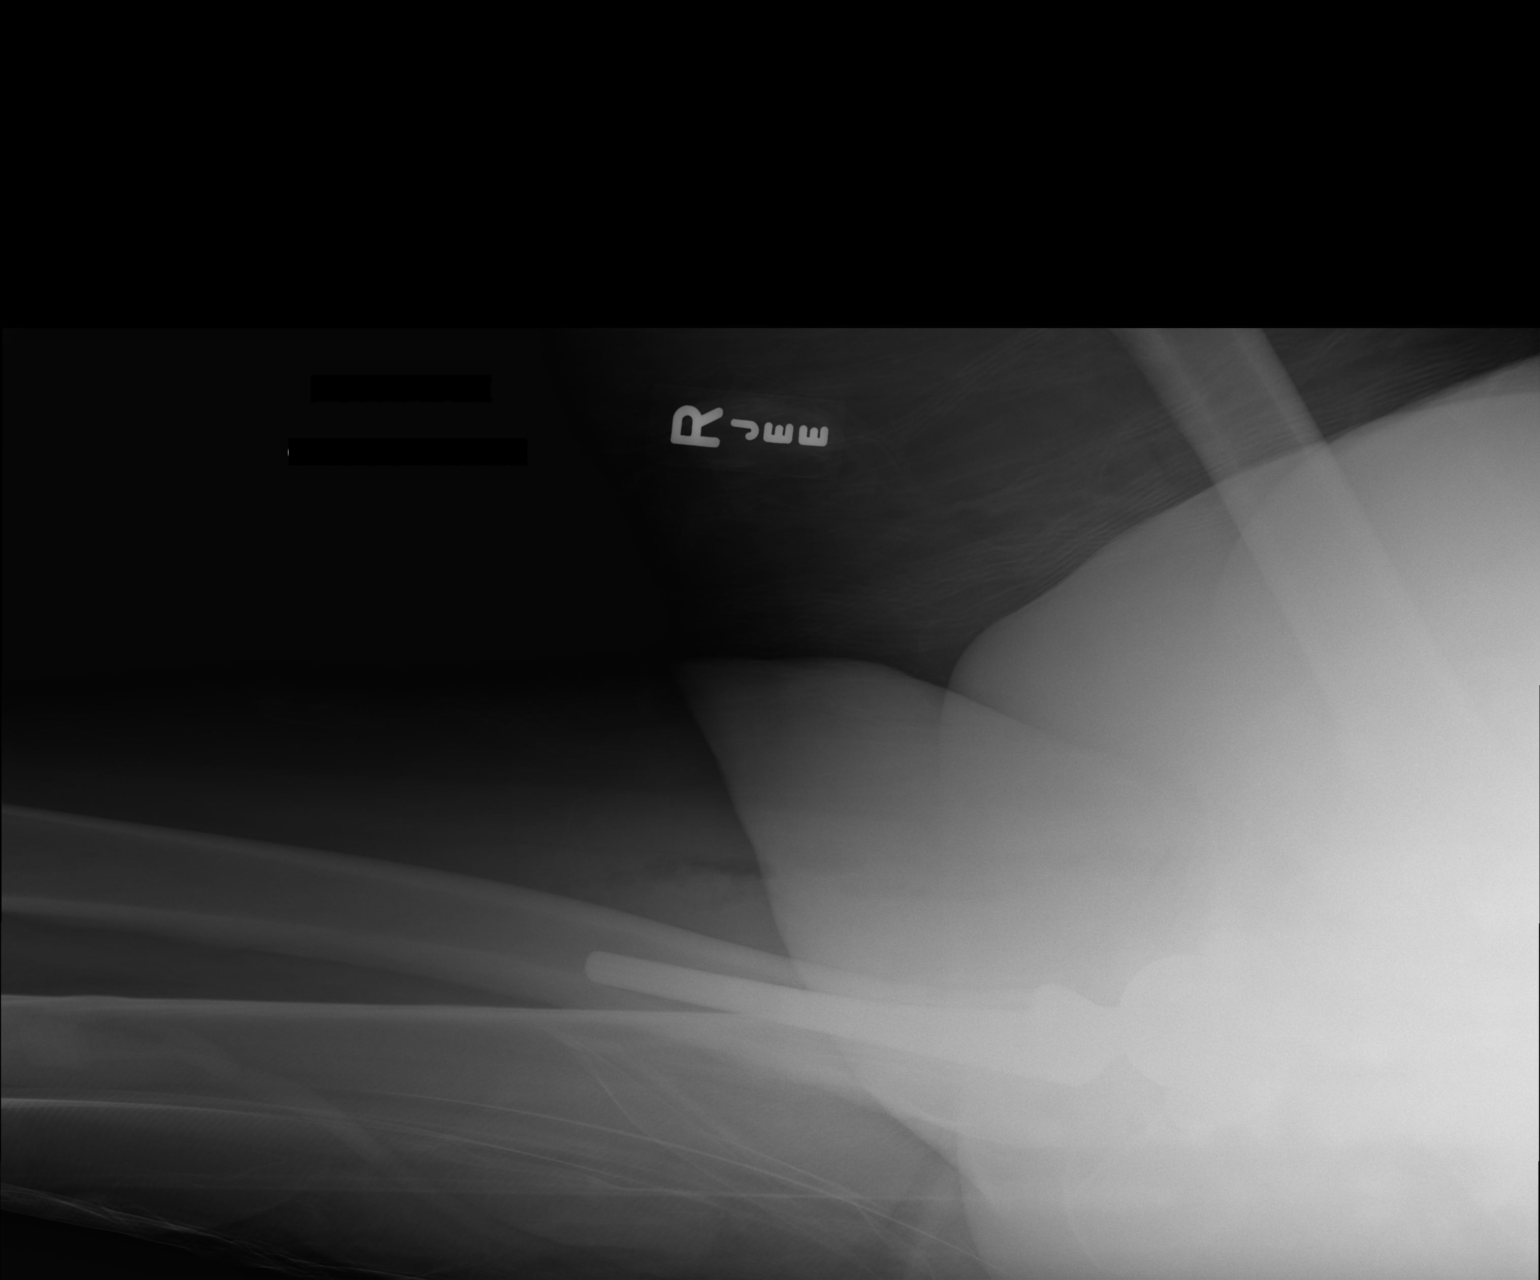

[1 of 1 positions shown; findings below may reference images not displayed]

FINDINGS: Postoperative change from right total hip arthroplasty
identified.  The hardware components are in anatomic alignment.  No
periprosthetic fracture or subluxation.  No radio-opaque foreign
body or soft tissue calcification.
IMPRESSION: 1.  Status post right hip arthroplasty.

## 2015-02-14 IMAGING — RF DG HIP OPERATIVE*R*
1 series · 4 of 4 positions shown · non-contrast
Comparison: 03/13/2013

CLINICAL DATA: Anterior right total hip arthroplasty.

DG OPERATIVE RIGHT HIP

[Series 1: run · 4 of 4 slices shown]
[im 1/4]
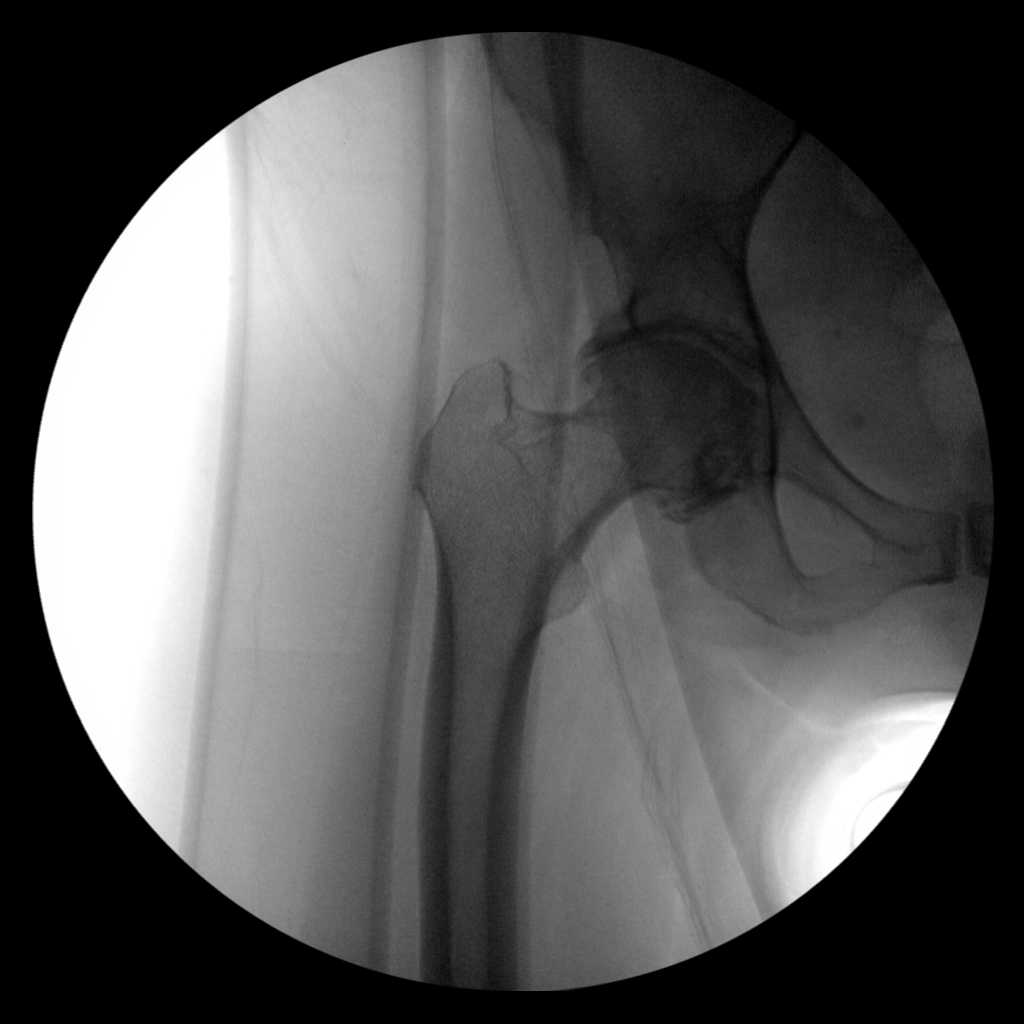
[im 2/4]
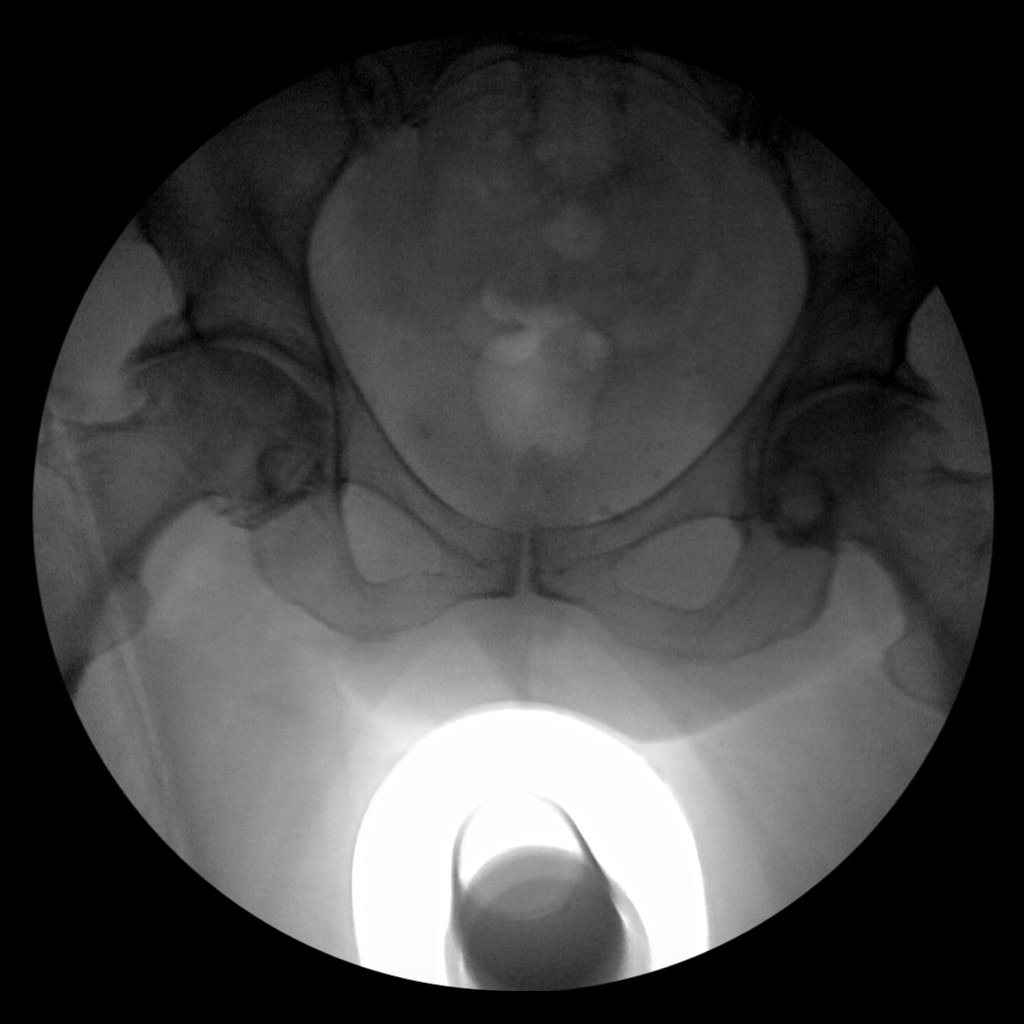
[im 3/4]
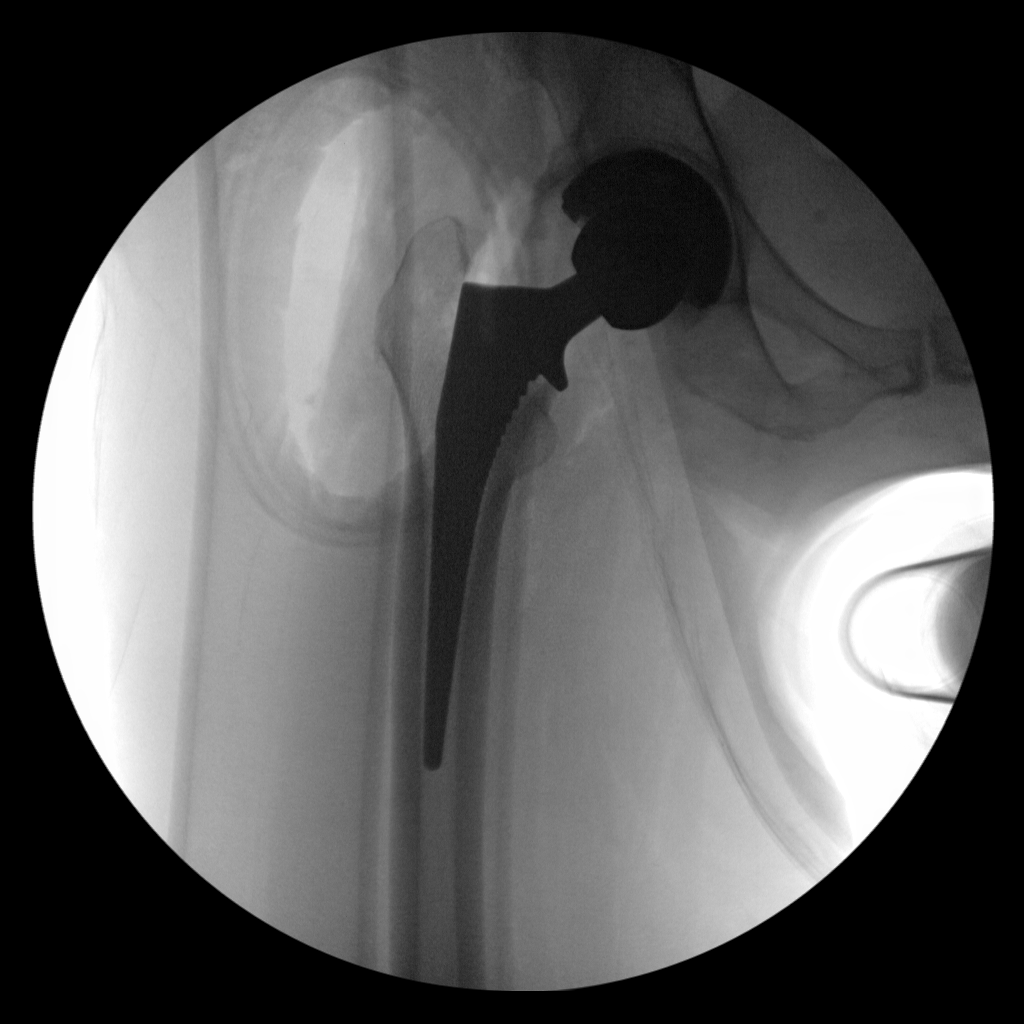
[im 4/4]
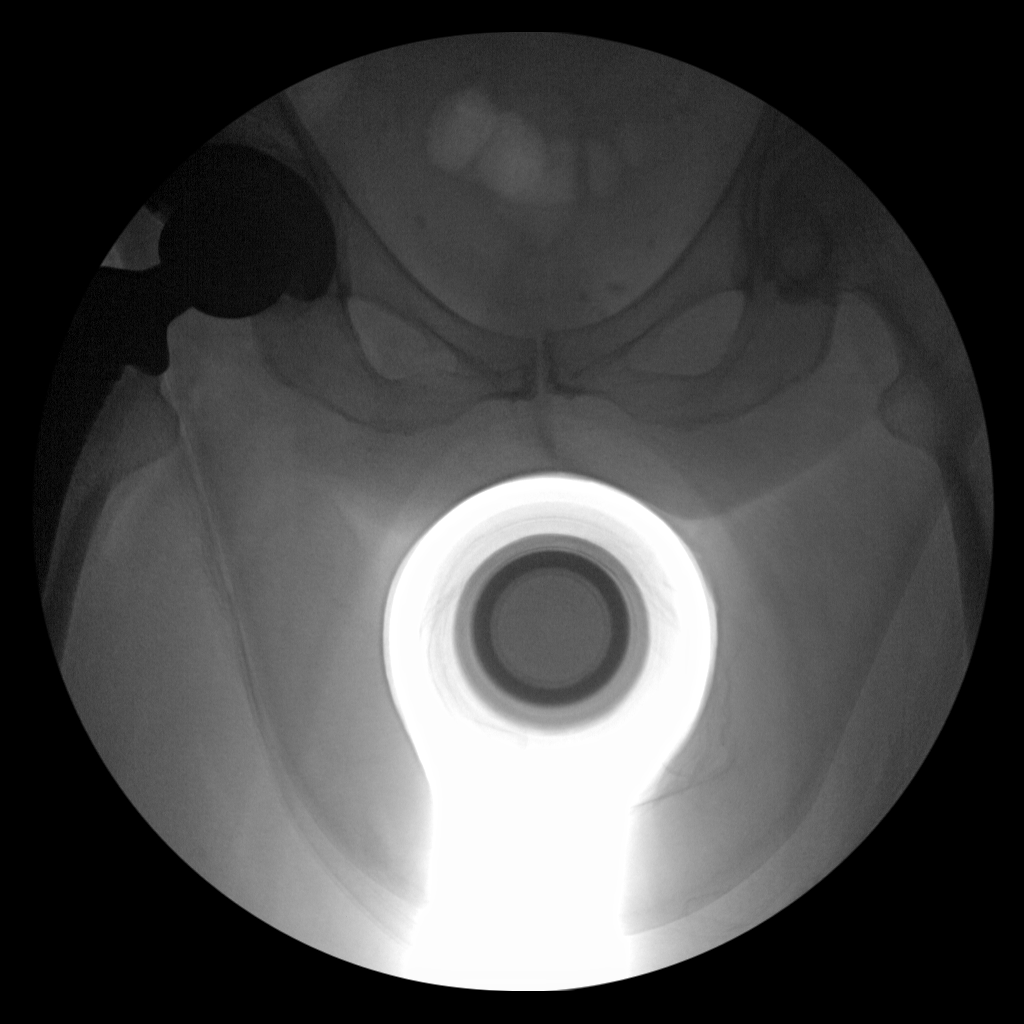

[4 of 4 positions shown; findings below may reference images not displayed]

FINDINGS: Additional image demonstrates advanced degenerative joint
disease changes within the right hip.  Subsequent spot imaging
demonstrates changes of right hip replacement.  Normal AP
alignment.  No hardware complicating feature visualized.
IMPRESSION: Right knee replacement.  No visible complicating feature.

## 2015-03-28 DIAGNOSIS — Z0000 Encounter for general adult medical examination without abnormal findings: Secondary | ICD-10-CM | POA: Diagnosis not present

## 2015-03-28 DIAGNOSIS — M810 Age-related osteoporosis without current pathological fracture: Secondary | ICD-10-CM | POA: Diagnosis not present

## 2015-03-28 DIAGNOSIS — C50912 Malignant neoplasm of unspecified site of left female breast: Secondary | ICD-10-CM | POA: Diagnosis not present

## 2015-03-28 DIAGNOSIS — E785 Hyperlipidemia, unspecified: Secondary | ICD-10-CM | POA: Diagnosis not present

## 2015-04-11 DIAGNOSIS — Z1211 Encounter for screening for malignant neoplasm of colon: Secondary | ICD-10-CM | POA: Diagnosis not present

## 2015-04-11 DIAGNOSIS — Z1212 Encounter for screening for malignant neoplasm of rectum: Secondary | ICD-10-CM | POA: Diagnosis not present

## 2015-06-01 DIAGNOSIS — Z1231 Encounter for screening mammogram for malignant neoplasm of breast: Secondary | ICD-10-CM | POA: Diagnosis not present

## 2015-06-14 DIAGNOSIS — Z853 Personal history of malignant neoplasm of breast: Secondary | ICD-10-CM | POA: Diagnosis not present

## 2015-06-14 DIAGNOSIS — Z79811 Long term (current) use of aromatase inhibitors: Secondary | ICD-10-CM | POA: Diagnosis not present

## 2015-07-17 DIAGNOSIS — S93492A Sprain of other ligament of left ankle, initial encounter: Secondary | ICD-10-CM | POA: Diagnosis not present

## 2015-07-19 DIAGNOSIS — M25572 Pain in left ankle and joints of left foot: Secondary | ICD-10-CM | POA: Diagnosis not present

## 2015-07-19 DIAGNOSIS — R2689 Other abnormalities of gait and mobility: Secondary | ICD-10-CM | POA: Diagnosis not present

## 2015-07-19 DIAGNOSIS — M6281 Muscle weakness (generalized): Secondary | ICD-10-CM | POA: Diagnosis not present

## 2015-07-19 DIAGNOSIS — M25672 Stiffness of left ankle, not elsewhere classified: Secondary | ICD-10-CM | POA: Diagnosis not present

## 2015-07-19 DIAGNOSIS — S93492D Sprain of other ligament of left ankle, subsequent encounter: Secondary | ICD-10-CM | POA: Diagnosis not present

## 2015-07-21 DIAGNOSIS — M25572 Pain in left ankle and joints of left foot: Secondary | ICD-10-CM | POA: Diagnosis not present

## 2015-07-21 DIAGNOSIS — M6281 Muscle weakness (generalized): Secondary | ICD-10-CM | POA: Diagnosis not present

## 2015-07-21 DIAGNOSIS — R2689 Other abnormalities of gait and mobility: Secondary | ICD-10-CM | POA: Diagnosis not present

## 2015-07-21 DIAGNOSIS — S93492D Sprain of other ligament of left ankle, subsequent encounter: Secondary | ICD-10-CM | POA: Diagnosis not present

## 2015-07-21 DIAGNOSIS — M25672 Stiffness of left ankle, not elsewhere classified: Secondary | ICD-10-CM | POA: Diagnosis not present

## 2015-07-26 DIAGNOSIS — M25672 Stiffness of left ankle, not elsewhere classified: Secondary | ICD-10-CM | POA: Diagnosis not present

## 2015-07-26 DIAGNOSIS — M25572 Pain in left ankle and joints of left foot: Secondary | ICD-10-CM | POA: Diagnosis not present

## 2015-07-26 DIAGNOSIS — M6281 Muscle weakness (generalized): Secondary | ICD-10-CM | POA: Diagnosis not present

## 2015-07-26 DIAGNOSIS — S93492D Sprain of other ligament of left ankle, subsequent encounter: Secondary | ICD-10-CM | POA: Diagnosis not present

## 2015-07-26 DIAGNOSIS — R2689 Other abnormalities of gait and mobility: Secondary | ICD-10-CM | POA: Diagnosis not present

## 2015-07-28 DIAGNOSIS — R2689 Other abnormalities of gait and mobility: Secondary | ICD-10-CM | POA: Diagnosis not present

## 2015-07-28 DIAGNOSIS — S93492D Sprain of other ligament of left ankle, subsequent encounter: Secondary | ICD-10-CM | POA: Diagnosis not present

## 2015-07-28 DIAGNOSIS — M25572 Pain in left ankle and joints of left foot: Secondary | ICD-10-CM | POA: Diagnosis not present

## 2015-07-28 DIAGNOSIS — M6281 Muscle weakness (generalized): Secondary | ICD-10-CM | POA: Diagnosis not present

## 2015-07-28 DIAGNOSIS — M25672 Stiffness of left ankle, not elsewhere classified: Secondary | ICD-10-CM | POA: Diagnosis not present

## 2015-08-01 DIAGNOSIS — M25672 Stiffness of left ankle, not elsewhere classified: Secondary | ICD-10-CM | POA: Diagnosis not present

## 2015-08-01 DIAGNOSIS — S93492D Sprain of other ligament of left ankle, subsequent encounter: Secondary | ICD-10-CM | POA: Diagnosis not present

## 2015-08-01 DIAGNOSIS — M25572 Pain in left ankle and joints of left foot: Secondary | ICD-10-CM | POA: Diagnosis not present

## 2015-08-01 DIAGNOSIS — M6281 Muscle weakness (generalized): Secondary | ICD-10-CM | POA: Diagnosis not present

## 2015-08-01 DIAGNOSIS — R2689 Other abnormalities of gait and mobility: Secondary | ICD-10-CM | POA: Diagnosis not present

## 2015-08-03 DIAGNOSIS — R2689 Other abnormalities of gait and mobility: Secondary | ICD-10-CM | POA: Diagnosis not present

## 2015-08-03 DIAGNOSIS — M6281 Muscle weakness (generalized): Secondary | ICD-10-CM | POA: Diagnosis not present

## 2015-08-03 DIAGNOSIS — M25672 Stiffness of left ankle, not elsewhere classified: Secondary | ICD-10-CM | POA: Diagnosis not present

## 2015-08-03 DIAGNOSIS — M25572 Pain in left ankle and joints of left foot: Secondary | ICD-10-CM | POA: Diagnosis not present

## 2015-08-03 DIAGNOSIS — S93492D Sprain of other ligament of left ankle, subsequent encounter: Secondary | ICD-10-CM | POA: Diagnosis not present

## 2015-08-08 DIAGNOSIS — M25572 Pain in left ankle and joints of left foot: Secondary | ICD-10-CM | POA: Diagnosis not present

## 2015-08-08 DIAGNOSIS — M25672 Stiffness of left ankle, not elsewhere classified: Secondary | ICD-10-CM | POA: Diagnosis not present

## 2015-08-08 DIAGNOSIS — R2689 Other abnormalities of gait and mobility: Secondary | ICD-10-CM | POA: Diagnosis not present

## 2015-08-08 DIAGNOSIS — M6281 Muscle weakness (generalized): Secondary | ICD-10-CM | POA: Diagnosis not present

## 2015-08-08 DIAGNOSIS — S93492D Sprain of other ligament of left ankle, subsequent encounter: Secondary | ICD-10-CM | POA: Diagnosis not present

## 2015-08-10 DIAGNOSIS — M6281 Muscle weakness (generalized): Secondary | ICD-10-CM | POA: Diagnosis not present

## 2015-08-10 DIAGNOSIS — S93492D Sprain of other ligament of left ankle, subsequent encounter: Secondary | ICD-10-CM | POA: Diagnosis not present

## 2015-08-10 DIAGNOSIS — R2689 Other abnormalities of gait and mobility: Secondary | ICD-10-CM | POA: Diagnosis not present

## 2015-08-10 DIAGNOSIS — Z23 Encounter for immunization: Secondary | ICD-10-CM | POA: Diagnosis not present

## 2015-08-10 DIAGNOSIS — M25572 Pain in left ankle and joints of left foot: Secondary | ICD-10-CM | POA: Diagnosis not present

## 2015-08-10 DIAGNOSIS — M25672 Stiffness of left ankle, not elsewhere classified: Secondary | ICD-10-CM | POA: Diagnosis not present

## 2015-08-15 DIAGNOSIS — M25672 Stiffness of left ankle, not elsewhere classified: Secondary | ICD-10-CM | POA: Diagnosis not present

## 2015-08-15 DIAGNOSIS — M25572 Pain in left ankle and joints of left foot: Secondary | ICD-10-CM | POA: Diagnosis not present

## 2015-08-15 DIAGNOSIS — S93492D Sprain of other ligament of left ankle, subsequent encounter: Secondary | ICD-10-CM | POA: Diagnosis not present

## 2015-08-15 DIAGNOSIS — R2689 Other abnormalities of gait and mobility: Secondary | ICD-10-CM | POA: Diagnosis not present

## 2015-08-15 DIAGNOSIS — M6281 Muscle weakness (generalized): Secondary | ICD-10-CM | POA: Diagnosis not present

## 2015-08-17 DIAGNOSIS — M6281 Muscle weakness (generalized): Secondary | ICD-10-CM | POA: Diagnosis not present

## 2015-08-17 DIAGNOSIS — R2689 Other abnormalities of gait and mobility: Secondary | ICD-10-CM | POA: Diagnosis not present

## 2015-08-17 DIAGNOSIS — M25672 Stiffness of left ankle, not elsewhere classified: Secondary | ICD-10-CM | POA: Diagnosis not present

## 2015-08-17 DIAGNOSIS — S93492D Sprain of other ligament of left ankle, subsequent encounter: Secondary | ICD-10-CM | POA: Diagnosis not present

## 2015-08-17 DIAGNOSIS — M25572 Pain in left ankle and joints of left foot: Secondary | ICD-10-CM | POA: Diagnosis not present

## 2015-08-22 DIAGNOSIS — M6281 Muscle weakness (generalized): Secondary | ICD-10-CM | POA: Diagnosis not present

## 2015-08-22 DIAGNOSIS — R2689 Other abnormalities of gait and mobility: Secondary | ICD-10-CM | POA: Diagnosis not present

## 2015-08-22 DIAGNOSIS — M25572 Pain in left ankle and joints of left foot: Secondary | ICD-10-CM | POA: Diagnosis not present

## 2015-08-22 DIAGNOSIS — S93492D Sprain of other ligament of left ankle, subsequent encounter: Secondary | ICD-10-CM | POA: Diagnosis not present

## 2015-08-22 DIAGNOSIS — M25672 Stiffness of left ankle, not elsewhere classified: Secondary | ICD-10-CM | POA: Diagnosis not present

## 2015-08-28 DIAGNOSIS — S93492D Sprain of other ligament of left ankle, subsequent encounter: Secondary | ICD-10-CM | POA: Diagnosis not present

## 2015-10-03 DIAGNOSIS — H47092 Other disorders of optic nerve, not elsewhere classified, left eye: Secondary | ICD-10-CM | POA: Diagnosis not present

## 2015-10-03 DIAGNOSIS — H524 Presbyopia: Secondary | ICD-10-CM | POA: Diagnosis not present

## 2015-10-03 DIAGNOSIS — H5203 Hypermetropia, bilateral: Secondary | ICD-10-CM | POA: Diagnosis not present

## 2015-10-03 DIAGNOSIS — H52221 Regular astigmatism, right eye: Secondary | ICD-10-CM | POA: Diagnosis not present

## 2015-10-03 DIAGNOSIS — H35362 Drusen (degenerative) of macula, left eye: Secondary | ICD-10-CM | POA: Diagnosis not present

## 2015-10-03 DIAGNOSIS — H47332 Pseudopapilledema of optic disc, left eye: Secondary | ICD-10-CM | POA: Diagnosis not present

## 2015-10-03 DIAGNOSIS — H35363 Drusen (degenerative) of macula, bilateral: Secondary | ICD-10-CM | POA: Diagnosis not present

## 2015-12-12 DIAGNOSIS — C50912 Malignant neoplasm of unspecified site of left female breast: Secondary | ICD-10-CM | POA: Diagnosis not present

## 2015-12-12 DIAGNOSIS — Z170 Estrogen receptor positive status [ER+]: Secondary | ICD-10-CM | POA: Diagnosis not present

## 2015-12-12 DIAGNOSIS — C50919 Malignant neoplasm of unspecified site of unspecified female breast: Secondary | ICD-10-CM | POA: Diagnosis not present

## 2015-12-12 DIAGNOSIS — Z79811 Long term (current) use of aromatase inhibitors: Secondary | ICD-10-CM | POA: Diagnosis not present

## 2015-12-12 DIAGNOSIS — M8580 Other specified disorders of bone density and structure, unspecified site: Secondary | ICD-10-CM | POA: Diagnosis not present

## 2016-01-13 DIAGNOSIS — M1711 Unilateral primary osteoarthritis, right knee: Secondary | ICD-10-CM | POA: Diagnosis not present

## 2016-01-13 DIAGNOSIS — M25561 Pain in right knee: Secondary | ICD-10-CM | POA: Diagnosis not present

## 2016-01-13 DIAGNOSIS — M1710 Unilateral primary osteoarthritis, unspecified knee: Secondary | ICD-10-CM | POA: Diagnosis not present

## 2016-01-16 DIAGNOSIS — M1711 Unilateral primary osteoarthritis, right knee: Secondary | ICD-10-CM | POA: Diagnosis not present

## 2016-01-19 ENCOUNTER — Other Ambulatory Visit: Payer: Self-pay

## 2016-01-19 DIAGNOSIS — K5720 Diverticulitis of large intestine with perforation and abscess without bleeding: Secondary | ICD-10-CM | POA: Diagnosis not present

## 2016-01-19 DIAGNOSIS — K631 Perforation of intestine (nontraumatic): Secondary | ICD-10-CM | POA: Diagnosis not present

## 2016-01-19 DIAGNOSIS — K668 Other specified disorders of peritoneum: Secondary | ICD-10-CM | POA: Diagnosis not present

## 2016-01-19 DIAGNOSIS — R1032 Left lower quadrant pain: Secondary | ICD-10-CM | POA: Diagnosis not present

## 2016-01-20 DIAGNOSIS — K668 Other specified disorders of peritoneum: Secondary | ICD-10-CM | POA: Diagnosis not present

## 2016-01-20 DIAGNOSIS — E872 Acidosis: Secondary | ICD-10-CM | POA: Diagnosis present

## 2016-01-20 DIAGNOSIS — M169 Osteoarthritis of hip, unspecified: Secondary | ICD-10-CM | POA: Diagnosis present

## 2016-01-20 DIAGNOSIS — R1907 Generalized intra-abdominal and pelvic swelling, mass and lump: Secondary | ICD-10-CM | POA: Diagnosis not present

## 2016-01-20 DIAGNOSIS — R9431 Abnormal electrocardiogram [ECG] [EKG]: Secondary | ICD-10-CM | POA: Diagnosis not present

## 2016-01-20 DIAGNOSIS — Z7982 Long term (current) use of aspirin: Secondary | ICD-10-CM | POA: Diagnosis not present

## 2016-01-20 DIAGNOSIS — R1084 Generalized abdominal pain: Secondary | ICD-10-CM | POA: Diagnosis present

## 2016-01-20 DIAGNOSIS — I340 Nonrheumatic mitral (valve) insufficiency: Secondary | ICD-10-CM | POA: Diagnosis not present

## 2016-01-20 DIAGNOSIS — K631 Perforation of intestine (nontraumatic): Secondary | ICD-10-CM | POA: Diagnosis not present

## 2016-01-20 DIAGNOSIS — K913 Postprocedural intestinal obstruction: Secondary | ICD-10-CM | POA: Diagnosis not present

## 2016-01-20 DIAGNOSIS — I361 Nonrheumatic tricuspid (valve) insufficiency: Secondary | ICD-10-CM | POA: Diagnosis not present

## 2016-01-20 DIAGNOSIS — A419 Sepsis, unspecified organism: Secondary | ICD-10-CM | POA: Diagnosis present

## 2016-01-20 DIAGNOSIS — I471 Supraventricular tachycardia: Secondary | ICD-10-CM | POA: Diagnosis not present

## 2016-01-20 DIAGNOSIS — I480 Paroxysmal atrial fibrillation: Secondary | ICD-10-CM | POA: Diagnosis present

## 2016-01-20 DIAGNOSIS — Z79899 Other long term (current) drug therapy: Secondary | ICD-10-CM | POA: Diagnosis not present

## 2016-01-20 DIAGNOSIS — R918 Other nonspecific abnormal finding of lung field: Secondary | ICD-10-CM | POA: Diagnosis not present

## 2016-01-20 DIAGNOSIS — R1032 Left lower quadrant pain: Secondary | ICD-10-CM | POA: Diagnosis not present

## 2016-01-20 DIAGNOSIS — K5720 Diverticulitis of large intestine with perforation and abscess without bleeding: Secondary | ICD-10-CM | POA: Diagnosis present

## 2016-01-30 DIAGNOSIS — Z48815 Encounter for surgical aftercare following surgery on the digestive system: Secondary | ICD-10-CM | POA: Diagnosis not present

## 2016-01-30 DIAGNOSIS — K5732 Diverticulitis of large intestine without perforation or abscess without bleeding: Secondary | ICD-10-CM | POA: Diagnosis not present

## 2016-01-30 DIAGNOSIS — Z433 Encounter for attention to colostomy: Secondary | ICD-10-CM | POA: Diagnosis not present

## 2016-01-30 DIAGNOSIS — Z853 Personal history of malignant neoplasm of breast: Secondary | ICD-10-CM | POA: Diagnosis not present

## 2016-01-30 DIAGNOSIS — Z7982 Long term (current) use of aspirin: Secondary | ICD-10-CM | POA: Diagnosis not present

## 2016-01-30 DIAGNOSIS — Z79891 Long term (current) use of opiate analgesic: Secondary | ICD-10-CM | POA: Diagnosis not present

## 2016-01-30 DIAGNOSIS — M169 Osteoarthritis of hip, unspecified: Secondary | ICD-10-CM | POA: Diagnosis not present

## 2016-02-02 DIAGNOSIS — Z48815 Encounter for surgical aftercare following surgery on the digestive system: Secondary | ICD-10-CM | POA: Diagnosis not present

## 2016-02-02 DIAGNOSIS — Z433 Encounter for attention to colostomy: Secondary | ICD-10-CM | POA: Diagnosis not present

## 2016-02-02 DIAGNOSIS — M169 Osteoarthritis of hip, unspecified: Secondary | ICD-10-CM | POA: Diagnosis not present

## 2016-02-02 DIAGNOSIS — Z853 Personal history of malignant neoplasm of breast: Secondary | ICD-10-CM | POA: Diagnosis not present

## 2016-02-02 DIAGNOSIS — Z7982 Long term (current) use of aspirin: Secondary | ICD-10-CM | POA: Diagnosis not present

## 2016-02-02 DIAGNOSIS — K5732 Diverticulitis of large intestine without perforation or abscess without bleeding: Secondary | ICD-10-CM | POA: Diagnosis not present

## 2016-02-05 DIAGNOSIS — Z09 Encounter for follow-up examination after completed treatment for conditions other than malignant neoplasm: Secondary | ICD-10-CM | POA: Insufficient documentation

## 2016-02-05 HISTORY — DX: Encounter for follow-up examination after completed treatment for conditions other than malignant neoplasm: Z09

## 2016-02-06 DIAGNOSIS — K5730 Diverticulosis of large intestine without perforation or abscess without bleeding: Secondary | ICD-10-CM | POA: Diagnosis not present

## 2016-02-06 DIAGNOSIS — Z48815 Encounter for surgical aftercare following surgery on the digestive system: Secondary | ICD-10-CM | POA: Diagnosis not present

## 2016-02-06 DIAGNOSIS — Z853 Personal history of malignant neoplasm of breast: Secondary | ICD-10-CM | POA: Diagnosis not present

## 2016-02-06 DIAGNOSIS — M169 Osteoarthritis of hip, unspecified: Secondary | ICD-10-CM | POA: Diagnosis not present

## 2016-02-06 DIAGNOSIS — Z433 Encounter for attention to colostomy: Secondary | ICD-10-CM | POA: Diagnosis not present

## 2016-02-06 DIAGNOSIS — Z7982 Long term (current) use of aspirin: Secondary | ICD-10-CM | POA: Diagnosis not present

## 2016-02-06 DIAGNOSIS — I480 Paroxysmal atrial fibrillation: Secondary | ICD-10-CM | POA: Diagnosis not present

## 2016-02-06 DIAGNOSIS — K5732 Diverticulitis of large intestine without perforation or abscess without bleeding: Secondary | ICD-10-CM | POA: Diagnosis not present

## 2016-02-07 DIAGNOSIS — K5732 Diverticulitis of large intestine without perforation or abscess without bleeding: Secondary | ICD-10-CM | POA: Diagnosis not present

## 2016-02-07 DIAGNOSIS — Z48815 Encounter for surgical aftercare following surgery on the digestive system: Secondary | ICD-10-CM | POA: Diagnosis not present

## 2016-02-07 DIAGNOSIS — Z7982 Long term (current) use of aspirin: Secondary | ICD-10-CM | POA: Diagnosis not present

## 2016-02-07 DIAGNOSIS — M169 Osteoarthritis of hip, unspecified: Secondary | ICD-10-CM | POA: Diagnosis not present

## 2016-02-07 DIAGNOSIS — Z853 Personal history of malignant neoplasm of breast: Secondary | ICD-10-CM | POA: Diagnosis not present

## 2016-02-07 DIAGNOSIS — Z433 Encounter for attention to colostomy: Secondary | ICD-10-CM | POA: Diagnosis not present

## 2016-02-08 DIAGNOSIS — M169 Osteoarthritis of hip, unspecified: Secondary | ICD-10-CM | POA: Diagnosis not present

## 2016-02-08 DIAGNOSIS — Z48815 Encounter for surgical aftercare following surgery on the digestive system: Secondary | ICD-10-CM | POA: Diagnosis not present

## 2016-02-08 DIAGNOSIS — Z7982 Long term (current) use of aspirin: Secondary | ICD-10-CM | POA: Diagnosis not present

## 2016-02-08 DIAGNOSIS — Z853 Personal history of malignant neoplasm of breast: Secondary | ICD-10-CM | POA: Diagnosis not present

## 2016-02-08 DIAGNOSIS — K5732 Diverticulitis of large intestine without perforation or abscess without bleeding: Secondary | ICD-10-CM | POA: Diagnosis not present

## 2016-02-08 DIAGNOSIS — Z433 Encounter for attention to colostomy: Secondary | ICD-10-CM | POA: Diagnosis not present

## 2016-02-09 DIAGNOSIS — K5732 Diverticulitis of large intestine without perforation or abscess without bleeding: Secondary | ICD-10-CM | POA: Diagnosis not present

## 2016-02-09 DIAGNOSIS — Z433 Encounter for attention to colostomy: Secondary | ICD-10-CM | POA: Diagnosis not present

## 2016-02-09 DIAGNOSIS — Z48815 Encounter for surgical aftercare following surgery on the digestive system: Secondary | ICD-10-CM | POA: Diagnosis not present

## 2016-02-09 DIAGNOSIS — Z7982 Long term (current) use of aspirin: Secondary | ICD-10-CM | POA: Diagnosis not present

## 2016-02-09 DIAGNOSIS — Z853 Personal history of malignant neoplasm of breast: Secondary | ICD-10-CM | POA: Diagnosis not present

## 2016-02-09 DIAGNOSIS — M169 Osteoarthritis of hip, unspecified: Secondary | ICD-10-CM | POA: Diagnosis not present

## 2016-02-10 DIAGNOSIS — K5732 Diverticulitis of large intestine without perforation or abscess without bleeding: Secondary | ICD-10-CM | POA: Diagnosis not present

## 2016-02-10 DIAGNOSIS — M169 Osteoarthritis of hip, unspecified: Secondary | ICD-10-CM | POA: Diagnosis not present

## 2016-02-10 DIAGNOSIS — Z48815 Encounter for surgical aftercare following surgery on the digestive system: Secondary | ICD-10-CM | POA: Diagnosis not present

## 2016-02-10 DIAGNOSIS — Z853 Personal history of malignant neoplasm of breast: Secondary | ICD-10-CM | POA: Diagnosis not present

## 2016-02-10 DIAGNOSIS — Z433 Encounter for attention to colostomy: Secondary | ICD-10-CM | POA: Diagnosis not present

## 2016-02-10 DIAGNOSIS — Z7982 Long term (current) use of aspirin: Secondary | ICD-10-CM | POA: Diagnosis not present

## 2016-02-11 DIAGNOSIS — M169 Osteoarthritis of hip, unspecified: Secondary | ICD-10-CM | POA: Diagnosis not present

## 2016-02-11 DIAGNOSIS — Z853 Personal history of malignant neoplasm of breast: Secondary | ICD-10-CM | POA: Diagnosis not present

## 2016-02-11 DIAGNOSIS — K5732 Diverticulitis of large intestine without perforation or abscess without bleeding: Secondary | ICD-10-CM | POA: Diagnosis not present

## 2016-02-11 DIAGNOSIS — Z433 Encounter for attention to colostomy: Secondary | ICD-10-CM | POA: Diagnosis not present

## 2016-02-11 DIAGNOSIS — Z48815 Encounter for surgical aftercare following surgery on the digestive system: Secondary | ICD-10-CM | POA: Diagnosis not present

## 2016-02-11 DIAGNOSIS — Z7982 Long term (current) use of aspirin: Secondary | ICD-10-CM | POA: Diagnosis not present

## 2016-02-12 DIAGNOSIS — Z48815 Encounter for surgical aftercare following surgery on the digestive system: Secondary | ICD-10-CM | POA: Diagnosis not present

## 2016-02-12 DIAGNOSIS — Z7982 Long term (current) use of aspirin: Secondary | ICD-10-CM | POA: Diagnosis not present

## 2016-02-12 DIAGNOSIS — Z853 Personal history of malignant neoplasm of breast: Secondary | ICD-10-CM | POA: Diagnosis not present

## 2016-02-12 DIAGNOSIS — Z433 Encounter for attention to colostomy: Secondary | ICD-10-CM | POA: Diagnosis not present

## 2016-02-12 DIAGNOSIS — M169 Osteoarthritis of hip, unspecified: Secondary | ICD-10-CM | POA: Diagnosis not present

## 2016-02-12 DIAGNOSIS — K5732 Diverticulitis of large intestine without perforation or abscess without bleeding: Secondary | ICD-10-CM | POA: Diagnosis not present

## 2016-02-13 DIAGNOSIS — Z7982 Long term (current) use of aspirin: Secondary | ICD-10-CM | POA: Diagnosis not present

## 2016-02-13 DIAGNOSIS — Z853 Personal history of malignant neoplasm of breast: Secondary | ICD-10-CM | POA: Diagnosis not present

## 2016-02-13 DIAGNOSIS — Z433 Encounter for attention to colostomy: Secondary | ICD-10-CM | POA: Diagnosis not present

## 2016-02-13 DIAGNOSIS — M169 Osteoarthritis of hip, unspecified: Secondary | ICD-10-CM | POA: Diagnosis not present

## 2016-02-13 DIAGNOSIS — I480 Paroxysmal atrial fibrillation: Secondary | ICD-10-CM

## 2016-02-13 DIAGNOSIS — Z48815 Encounter for surgical aftercare following surgery on the digestive system: Secondary | ICD-10-CM | POA: Diagnosis not present

## 2016-02-13 DIAGNOSIS — K5732 Diverticulitis of large intestine without perforation or abscess without bleeding: Secondary | ICD-10-CM | POA: Diagnosis not present

## 2016-02-13 HISTORY — DX: Paroxysmal atrial fibrillation: I48.0

## 2016-02-14 DIAGNOSIS — Z853 Personal history of malignant neoplasm of breast: Secondary | ICD-10-CM | POA: Diagnosis not present

## 2016-02-14 DIAGNOSIS — Z7982 Long term (current) use of aspirin: Secondary | ICD-10-CM | POA: Diagnosis not present

## 2016-02-14 DIAGNOSIS — Z48815 Encounter for surgical aftercare following surgery on the digestive system: Secondary | ICD-10-CM | POA: Diagnosis not present

## 2016-02-14 DIAGNOSIS — M169 Osteoarthritis of hip, unspecified: Secondary | ICD-10-CM | POA: Diagnosis not present

## 2016-02-14 DIAGNOSIS — Z433 Encounter for attention to colostomy: Secondary | ICD-10-CM | POA: Diagnosis not present

## 2016-02-14 DIAGNOSIS — K5732 Diverticulitis of large intestine without perforation or abscess without bleeding: Secondary | ICD-10-CM | POA: Diagnosis not present

## 2016-02-16 DIAGNOSIS — M169 Osteoarthritis of hip, unspecified: Secondary | ICD-10-CM | POA: Diagnosis not present

## 2016-02-16 DIAGNOSIS — Z433 Encounter for attention to colostomy: Secondary | ICD-10-CM | POA: Diagnosis not present

## 2016-02-16 DIAGNOSIS — K5732 Diverticulitis of large intestine without perforation or abscess without bleeding: Secondary | ICD-10-CM | POA: Diagnosis not present

## 2016-02-16 DIAGNOSIS — Z853 Personal history of malignant neoplasm of breast: Secondary | ICD-10-CM | POA: Diagnosis not present

## 2016-02-16 DIAGNOSIS — Z48815 Encounter for surgical aftercare following surgery on the digestive system: Secondary | ICD-10-CM | POA: Diagnosis not present

## 2016-02-16 DIAGNOSIS — Z7982 Long term (current) use of aspirin: Secondary | ICD-10-CM | POA: Diagnosis not present

## 2016-02-17 DIAGNOSIS — Z7982 Long term (current) use of aspirin: Secondary | ICD-10-CM | POA: Diagnosis not present

## 2016-02-17 DIAGNOSIS — M169 Osteoarthritis of hip, unspecified: Secondary | ICD-10-CM | POA: Diagnosis not present

## 2016-02-17 DIAGNOSIS — Z433 Encounter for attention to colostomy: Secondary | ICD-10-CM | POA: Diagnosis not present

## 2016-02-17 DIAGNOSIS — K5732 Diverticulitis of large intestine without perforation or abscess without bleeding: Secondary | ICD-10-CM | POA: Diagnosis not present

## 2016-02-17 DIAGNOSIS — Z853 Personal history of malignant neoplasm of breast: Secondary | ICD-10-CM | POA: Diagnosis not present

## 2016-02-17 DIAGNOSIS — Z48815 Encounter for surgical aftercare following surgery on the digestive system: Secondary | ICD-10-CM | POA: Diagnosis not present

## 2016-02-18 DIAGNOSIS — Z853 Personal history of malignant neoplasm of breast: Secondary | ICD-10-CM | POA: Diagnosis not present

## 2016-02-18 DIAGNOSIS — M169 Osteoarthritis of hip, unspecified: Secondary | ICD-10-CM | POA: Diagnosis not present

## 2016-02-18 DIAGNOSIS — Z433 Encounter for attention to colostomy: Secondary | ICD-10-CM | POA: Diagnosis not present

## 2016-02-18 DIAGNOSIS — Z48815 Encounter for surgical aftercare following surgery on the digestive system: Secondary | ICD-10-CM | POA: Diagnosis not present

## 2016-02-18 DIAGNOSIS — Z7982 Long term (current) use of aspirin: Secondary | ICD-10-CM | POA: Diagnosis not present

## 2016-02-18 DIAGNOSIS — K5732 Diverticulitis of large intestine without perforation or abscess without bleeding: Secondary | ICD-10-CM | POA: Diagnosis not present

## 2016-02-19 DIAGNOSIS — Z853 Personal history of malignant neoplasm of breast: Secondary | ICD-10-CM | POA: Diagnosis not present

## 2016-02-19 DIAGNOSIS — Z48815 Encounter for surgical aftercare following surgery on the digestive system: Secondary | ICD-10-CM | POA: Diagnosis not present

## 2016-02-19 DIAGNOSIS — K5732 Diverticulitis of large intestine without perforation or abscess without bleeding: Secondary | ICD-10-CM | POA: Diagnosis not present

## 2016-02-19 DIAGNOSIS — Z7982 Long term (current) use of aspirin: Secondary | ICD-10-CM | POA: Diagnosis not present

## 2016-02-19 DIAGNOSIS — Z433 Encounter for attention to colostomy: Secondary | ICD-10-CM | POA: Diagnosis not present

## 2016-02-19 DIAGNOSIS — M169 Osteoarthritis of hip, unspecified: Secondary | ICD-10-CM | POA: Diagnosis not present

## 2016-02-20 DIAGNOSIS — Z853 Personal history of malignant neoplasm of breast: Secondary | ICD-10-CM | POA: Diagnosis not present

## 2016-02-20 DIAGNOSIS — Z7982 Long term (current) use of aspirin: Secondary | ICD-10-CM | POA: Diagnosis not present

## 2016-02-20 DIAGNOSIS — K5732 Diverticulitis of large intestine without perforation or abscess without bleeding: Secondary | ICD-10-CM | POA: Diagnosis not present

## 2016-02-20 DIAGNOSIS — Z48815 Encounter for surgical aftercare following surgery on the digestive system: Secondary | ICD-10-CM | POA: Diagnosis not present

## 2016-02-20 DIAGNOSIS — M169 Osteoarthritis of hip, unspecified: Secondary | ICD-10-CM | POA: Diagnosis not present

## 2016-02-20 DIAGNOSIS — Z433 Encounter for attention to colostomy: Secondary | ICD-10-CM | POA: Diagnosis not present

## 2016-02-21 DIAGNOSIS — K5732 Diverticulitis of large intestine without perforation or abscess without bleeding: Secondary | ICD-10-CM | POA: Diagnosis not present

## 2016-02-21 DIAGNOSIS — Z7982 Long term (current) use of aspirin: Secondary | ICD-10-CM | POA: Diagnosis not present

## 2016-02-21 DIAGNOSIS — Z48815 Encounter for surgical aftercare following surgery on the digestive system: Secondary | ICD-10-CM | POA: Diagnosis not present

## 2016-02-21 DIAGNOSIS — Z433 Encounter for attention to colostomy: Secondary | ICD-10-CM | POA: Diagnosis not present

## 2016-02-21 DIAGNOSIS — Z853 Personal history of malignant neoplasm of breast: Secondary | ICD-10-CM | POA: Diagnosis not present

## 2016-02-21 DIAGNOSIS — M169 Osteoarthritis of hip, unspecified: Secondary | ICD-10-CM | POA: Diagnosis not present

## 2016-02-22 DIAGNOSIS — Z853 Personal history of malignant neoplasm of breast: Secondary | ICD-10-CM | POA: Diagnosis not present

## 2016-02-22 DIAGNOSIS — Z48815 Encounter for surgical aftercare following surgery on the digestive system: Secondary | ICD-10-CM | POA: Diagnosis not present

## 2016-02-22 DIAGNOSIS — Z433 Encounter for attention to colostomy: Secondary | ICD-10-CM | POA: Diagnosis not present

## 2016-02-22 DIAGNOSIS — K5732 Diverticulitis of large intestine without perforation or abscess without bleeding: Secondary | ICD-10-CM | POA: Diagnosis not present

## 2016-02-22 DIAGNOSIS — M169 Osteoarthritis of hip, unspecified: Secondary | ICD-10-CM | POA: Diagnosis not present

## 2016-02-22 DIAGNOSIS — Z7982 Long term (current) use of aspirin: Secondary | ICD-10-CM | POA: Diagnosis not present

## 2016-02-23 DIAGNOSIS — Z7982 Long term (current) use of aspirin: Secondary | ICD-10-CM | POA: Diagnosis not present

## 2016-02-23 DIAGNOSIS — Z853 Personal history of malignant neoplasm of breast: Secondary | ICD-10-CM | POA: Diagnosis not present

## 2016-02-23 DIAGNOSIS — Z433 Encounter for attention to colostomy: Secondary | ICD-10-CM | POA: Diagnosis not present

## 2016-02-23 DIAGNOSIS — K5732 Diverticulitis of large intestine without perforation or abscess without bleeding: Secondary | ICD-10-CM | POA: Diagnosis not present

## 2016-02-23 DIAGNOSIS — Z48815 Encounter for surgical aftercare following surgery on the digestive system: Secondary | ICD-10-CM | POA: Diagnosis not present

## 2016-02-23 DIAGNOSIS — M169 Osteoarthritis of hip, unspecified: Secondary | ICD-10-CM | POA: Diagnosis not present

## 2016-02-24 DIAGNOSIS — Z433 Encounter for attention to colostomy: Secondary | ICD-10-CM | POA: Diagnosis not present

## 2016-02-24 DIAGNOSIS — Z48815 Encounter for surgical aftercare following surgery on the digestive system: Secondary | ICD-10-CM | POA: Diagnosis not present

## 2016-02-24 DIAGNOSIS — K5732 Diverticulitis of large intestine without perforation or abscess without bleeding: Secondary | ICD-10-CM | POA: Diagnosis not present

## 2016-02-24 DIAGNOSIS — M169 Osteoarthritis of hip, unspecified: Secondary | ICD-10-CM | POA: Diagnosis not present

## 2016-02-24 DIAGNOSIS — Z7982 Long term (current) use of aspirin: Secondary | ICD-10-CM | POA: Diagnosis not present

## 2016-02-24 DIAGNOSIS — Z853 Personal history of malignant neoplasm of breast: Secondary | ICD-10-CM | POA: Diagnosis not present

## 2016-02-25 DIAGNOSIS — M169 Osteoarthritis of hip, unspecified: Secondary | ICD-10-CM | POA: Diagnosis not present

## 2016-02-25 DIAGNOSIS — K5732 Diverticulitis of large intestine without perforation or abscess without bleeding: Secondary | ICD-10-CM | POA: Diagnosis not present

## 2016-02-25 DIAGNOSIS — Z853 Personal history of malignant neoplasm of breast: Secondary | ICD-10-CM | POA: Diagnosis not present

## 2016-02-25 DIAGNOSIS — Z7982 Long term (current) use of aspirin: Secondary | ICD-10-CM | POA: Diagnosis not present

## 2016-02-25 DIAGNOSIS — Z48815 Encounter for surgical aftercare following surgery on the digestive system: Secondary | ICD-10-CM | POA: Diagnosis not present

## 2016-02-25 DIAGNOSIS — Z433 Encounter for attention to colostomy: Secondary | ICD-10-CM | POA: Diagnosis not present

## 2016-02-27 DIAGNOSIS — K5732 Diverticulitis of large intestine without perforation or abscess without bleeding: Secondary | ICD-10-CM | POA: Diagnosis not present

## 2016-02-27 DIAGNOSIS — M169 Osteoarthritis of hip, unspecified: Secondary | ICD-10-CM | POA: Diagnosis not present

## 2016-02-27 DIAGNOSIS — Z7982 Long term (current) use of aspirin: Secondary | ICD-10-CM | POA: Diagnosis not present

## 2016-02-27 DIAGNOSIS — Z853 Personal history of malignant neoplasm of breast: Secondary | ICD-10-CM | POA: Diagnosis not present

## 2016-02-27 DIAGNOSIS — Z48815 Encounter for surgical aftercare following surgery on the digestive system: Secondary | ICD-10-CM | POA: Diagnosis not present

## 2016-02-27 DIAGNOSIS — Z433 Encounter for attention to colostomy: Secondary | ICD-10-CM | POA: Diagnosis not present

## 2016-02-28 DIAGNOSIS — K5732 Diverticulitis of large intestine without perforation or abscess without bleeding: Secondary | ICD-10-CM | POA: Diagnosis not present

## 2016-02-28 DIAGNOSIS — Z48815 Encounter for surgical aftercare following surgery on the digestive system: Secondary | ICD-10-CM | POA: Diagnosis not present

## 2016-02-28 DIAGNOSIS — Z433 Encounter for attention to colostomy: Secondary | ICD-10-CM | POA: Diagnosis not present

## 2016-02-28 DIAGNOSIS — M169 Osteoarthritis of hip, unspecified: Secondary | ICD-10-CM | POA: Diagnosis not present

## 2016-02-28 DIAGNOSIS — Z853 Personal history of malignant neoplasm of breast: Secondary | ICD-10-CM | POA: Diagnosis not present

## 2016-02-28 DIAGNOSIS — Z7982 Long term (current) use of aspirin: Secondary | ICD-10-CM | POA: Diagnosis not present

## 2016-02-29 DIAGNOSIS — Z48815 Encounter for surgical aftercare following surgery on the digestive system: Secondary | ICD-10-CM | POA: Diagnosis not present

## 2016-02-29 DIAGNOSIS — Z7982 Long term (current) use of aspirin: Secondary | ICD-10-CM | POA: Diagnosis not present

## 2016-02-29 DIAGNOSIS — Z433 Encounter for attention to colostomy: Secondary | ICD-10-CM | POA: Diagnosis not present

## 2016-02-29 DIAGNOSIS — K5732 Diverticulitis of large intestine without perforation or abscess without bleeding: Secondary | ICD-10-CM | POA: Diagnosis not present

## 2016-02-29 DIAGNOSIS — Z853 Personal history of malignant neoplasm of breast: Secondary | ICD-10-CM | POA: Diagnosis not present

## 2016-02-29 DIAGNOSIS — M169 Osteoarthritis of hip, unspecified: Secondary | ICD-10-CM | POA: Diagnosis not present

## 2016-03-01 DIAGNOSIS — Z48815 Encounter for surgical aftercare following surgery on the digestive system: Secondary | ICD-10-CM | POA: Diagnosis not present

## 2016-03-01 DIAGNOSIS — Z433 Encounter for attention to colostomy: Secondary | ICD-10-CM | POA: Diagnosis not present

## 2016-03-01 DIAGNOSIS — K5732 Diverticulitis of large intestine without perforation or abscess without bleeding: Secondary | ICD-10-CM | POA: Diagnosis not present

## 2016-03-01 DIAGNOSIS — Z853 Personal history of malignant neoplasm of breast: Secondary | ICD-10-CM | POA: Diagnosis not present

## 2016-03-01 DIAGNOSIS — Z7982 Long term (current) use of aspirin: Secondary | ICD-10-CM | POA: Diagnosis not present

## 2016-03-01 DIAGNOSIS — M169 Osteoarthritis of hip, unspecified: Secondary | ICD-10-CM | POA: Diagnosis not present

## 2016-03-05 DIAGNOSIS — Z853 Personal history of malignant neoplasm of breast: Secondary | ICD-10-CM | POA: Diagnosis not present

## 2016-03-05 DIAGNOSIS — Z433 Encounter for attention to colostomy: Secondary | ICD-10-CM | POA: Diagnosis not present

## 2016-03-05 DIAGNOSIS — Z48815 Encounter for surgical aftercare following surgery on the digestive system: Secondary | ICD-10-CM | POA: Diagnosis not present

## 2016-03-05 DIAGNOSIS — Z7982 Long term (current) use of aspirin: Secondary | ICD-10-CM | POA: Diagnosis not present

## 2016-03-05 DIAGNOSIS — K5732 Diverticulitis of large intestine without perforation or abscess without bleeding: Secondary | ICD-10-CM | POA: Diagnosis not present

## 2016-03-05 DIAGNOSIS — M169 Osteoarthritis of hip, unspecified: Secondary | ICD-10-CM | POA: Diagnosis not present

## 2016-03-08 DIAGNOSIS — Z433 Encounter for attention to colostomy: Secondary | ICD-10-CM | POA: Diagnosis not present

## 2016-03-08 DIAGNOSIS — K5732 Diverticulitis of large intestine without perforation or abscess without bleeding: Secondary | ICD-10-CM | POA: Diagnosis not present

## 2016-03-08 DIAGNOSIS — Z48815 Encounter for surgical aftercare following surgery on the digestive system: Secondary | ICD-10-CM | POA: Diagnosis not present

## 2016-03-08 DIAGNOSIS — Z7982 Long term (current) use of aspirin: Secondary | ICD-10-CM | POA: Diagnosis not present

## 2016-03-08 DIAGNOSIS — M169 Osteoarthritis of hip, unspecified: Secondary | ICD-10-CM | POA: Diagnosis not present

## 2016-03-08 DIAGNOSIS — Z853 Personal history of malignant neoplasm of breast: Secondary | ICD-10-CM | POA: Diagnosis not present

## 2016-03-12 DIAGNOSIS — Z7982 Long term (current) use of aspirin: Secondary | ICD-10-CM | POA: Diagnosis not present

## 2016-03-12 DIAGNOSIS — M169 Osteoarthritis of hip, unspecified: Secondary | ICD-10-CM | POA: Diagnosis not present

## 2016-03-12 DIAGNOSIS — Z48815 Encounter for surgical aftercare following surgery on the digestive system: Secondary | ICD-10-CM | POA: Diagnosis not present

## 2016-03-12 DIAGNOSIS — Z853 Personal history of malignant neoplasm of breast: Secondary | ICD-10-CM | POA: Diagnosis not present

## 2016-03-12 DIAGNOSIS — K5732 Diverticulitis of large intestine without perforation or abscess without bleeding: Secondary | ICD-10-CM | POA: Diagnosis not present

## 2016-03-12 DIAGNOSIS — Z433 Encounter for attention to colostomy: Secondary | ICD-10-CM | POA: Diagnosis not present

## 2016-03-19 DIAGNOSIS — Z853 Personal history of malignant neoplasm of breast: Secondary | ICD-10-CM | POA: Diagnosis not present

## 2016-03-19 DIAGNOSIS — K5732 Diverticulitis of large intestine without perforation or abscess without bleeding: Secondary | ICD-10-CM | POA: Diagnosis not present

## 2016-03-19 DIAGNOSIS — Z7982 Long term (current) use of aspirin: Secondary | ICD-10-CM | POA: Diagnosis not present

## 2016-03-19 DIAGNOSIS — M169 Osteoarthritis of hip, unspecified: Secondary | ICD-10-CM | POA: Diagnosis not present

## 2016-03-19 DIAGNOSIS — Z433 Encounter for attention to colostomy: Secondary | ICD-10-CM | POA: Diagnosis not present

## 2016-03-19 DIAGNOSIS — Z48815 Encounter for surgical aftercare following surgery on the digestive system: Secondary | ICD-10-CM | POA: Diagnosis not present

## 2016-03-26 DIAGNOSIS — Z433 Encounter for attention to colostomy: Secondary | ICD-10-CM | POA: Diagnosis not present

## 2016-03-26 DIAGNOSIS — M169 Osteoarthritis of hip, unspecified: Secondary | ICD-10-CM | POA: Diagnosis not present

## 2016-03-26 DIAGNOSIS — Z7982 Long term (current) use of aspirin: Secondary | ICD-10-CM | POA: Diagnosis not present

## 2016-03-26 DIAGNOSIS — Z48815 Encounter for surgical aftercare following surgery on the digestive system: Secondary | ICD-10-CM | POA: Diagnosis not present

## 2016-03-26 DIAGNOSIS — K5732 Diverticulitis of large intestine without perforation or abscess without bleeding: Secondary | ICD-10-CM | POA: Diagnosis not present

## 2016-03-26 DIAGNOSIS — Z853 Personal history of malignant neoplasm of breast: Secondary | ICD-10-CM | POA: Diagnosis not present

## 2016-03-27 DIAGNOSIS — M1711 Unilateral primary osteoarthritis, right knee: Secondary | ICD-10-CM | POA: Diagnosis not present

## 2016-03-27 DIAGNOSIS — E785 Hyperlipidemia, unspecified: Secondary | ICD-10-CM | POA: Diagnosis not present

## 2016-03-27 DIAGNOSIS — Z0001 Encounter for general adult medical examination with abnormal findings: Secondary | ICD-10-CM | POA: Diagnosis not present

## 2016-03-27 DIAGNOSIS — Z853 Personal history of malignant neoplasm of breast: Secondary | ICD-10-CM | POA: Diagnosis not present

## 2016-03-27 DIAGNOSIS — M810 Age-related osteoporosis without current pathological fracture: Secondary | ICD-10-CM | POA: Diagnosis not present

## 2016-03-27 DIAGNOSIS — Z1389 Encounter for screening for other disorder: Secondary | ICD-10-CM | POA: Diagnosis not present

## 2016-03-30 DIAGNOSIS — Z79891 Long term (current) use of opiate analgesic: Secondary | ICD-10-CM | POA: Diagnosis not present

## 2016-03-30 DIAGNOSIS — M169 Osteoarthritis of hip, unspecified: Secondary | ICD-10-CM | POA: Diagnosis not present

## 2016-03-30 DIAGNOSIS — K5732 Diverticulitis of large intestine without perforation or abscess without bleeding: Secondary | ICD-10-CM | POA: Diagnosis not present

## 2016-03-30 DIAGNOSIS — Z7982 Long term (current) use of aspirin: Secondary | ICD-10-CM | POA: Diagnosis not present

## 2016-03-30 DIAGNOSIS — Z853 Personal history of malignant neoplasm of breast: Secondary | ICD-10-CM | POA: Diagnosis not present

## 2016-03-30 DIAGNOSIS — Z48815 Encounter for surgical aftercare following surgery on the digestive system: Secondary | ICD-10-CM | POA: Diagnosis not present

## 2016-03-30 DIAGNOSIS — Z433 Encounter for attention to colostomy: Secondary | ICD-10-CM | POA: Diagnosis not present

## 2016-04-02 DIAGNOSIS — K5732 Diverticulitis of large intestine without perforation or abscess without bleeding: Secondary | ICD-10-CM | POA: Diagnosis not present

## 2016-04-02 DIAGNOSIS — Z853 Personal history of malignant neoplasm of breast: Secondary | ICD-10-CM | POA: Diagnosis not present

## 2016-04-02 DIAGNOSIS — Z7982 Long term (current) use of aspirin: Secondary | ICD-10-CM | POA: Diagnosis not present

## 2016-04-02 DIAGNOSIS — Z48815 Encounter for surgical aftercare following surgery on the digestive system: Secondary | ICD-10-CM | POA: Diagnosis not present

## 2016-04-02 DIAGNOSIS — Z433 Encounter for attention to colostomy: Secondary | ICD-10-CM | POA: Diagnosis not present

## 2016-04-02 DIAGNOSIS — M169 Osteoarthritis of hip, unspecified: Secondary | ICD-10-CM | POA: Diagnosis not present

## 2016-04-11 DIAGNOSIS — M169 Osteoarthritis of hip, unspecified: Secondary | ICD-10-CM | POA: Diagnosis not present

## 2016-04-11 DIAGNOSIS — K5732 Diverticulitis of large intestine without perforation or abscess without bleeding: Secondary | ICD-10-CM | POA: Diagnosis not present

## 2016-04-11 DIAGNOSIS — Z433 Encounter for attention to colostomy: Secondary | ICD-10-CM | POA: Diagnosis not present

## 2016-04-11 DIAGNOSIS — Z853 Personal history of malignant neoplasm of breast: Secondary | ICD-10-CM | POA: Diagnosis not present

## 2016-04-11 DIAGNOSIS — Z7982 Long term (current) use of aspirin: Secondary | ICD-10-CM | POA: Diagnosis not present

## 2016-04-11 DIAGNOSIS — Z48815 Encounter for surgical aftercare following surgery on the digestive system: Secondary | ICD-10-CM | POA: Diagnosis not present

## 2016-05-27 DIAGNOSIS — Z433 Encounter for attention to colostomy: Secondary | ICD-10-CM

## 2016-05-27 HISTORY — DX: Encounter for attention to colostomy: Z43.3

## 2016-06-12 DIAGNOSIS — M8589 Other specified disorders of bone density and structure, multiple sites: Secondary | ICD-10-CM | POA: Diagnosis not present

## 2016-06-12 DIAGNOSIS — M85832 Other specified disorders of bone density and structure, left forearm: Secondary | ICD-10-CM | POA: Diagnosis not present

## 2016-06-12 DIAGNOSIS — I480 Paroxysmal atrial fibrillation: Secondary | ICD-10-CM | POA: Diagnosis not present

## 2016-06-12 DIAGNOSIS — Z1231 Encounter for screening mammogram for malignant neoplasm of breast: Secondary | ICD-10-CM | POA: Diagnosis not present

## 2016-06-14 DIAGNOSIS — Z853 Personal history of malignant neoplasm of breast: Secondary | ICD-10-CM | POA: Diagnosis not present

## 2016-06-14 DIAGNOSIS — Z79811 Long term (current) use of aromatase inhibitors: Secondary | ICD-10-CM | POA: Diagnosis not present

## 2016-06-20 DIAGNOSIS — Z79899 Other long term (current) drug therapy: Secondary | ICD-10-CM | POA: Diagnosis not present

## 2016-06-20 DIAGNOSIS — K635 Polyp of colon: Secondary | ICD-10-CM | POA: Diagnosis not present

## 2016-06-20 DIAGNOSIS — Z933 Colostomy status: Secondary | ICD-10-CM | POA: Diagnosis not present

## 2016-06-20 DIAGNOSIS — K5732 Diverticulitis of large intestine without perforation or abscess without bleeding: Secondary | ICD-10-CM | POA: Diagnosis not present

## 2016-07-02 DIAGNOSIS — E039 Hypothyroidism, unspecified: Secondary | ICD-10-CM | POA: Diagnosis not present

## 2016-07-02 DIAGNOSIS — Z433 Encounter for attention to colostomy: Secondary | ICD-10-CM | POA: Diagnosis not present

## 2016-07-17 DIAGNOSIS — Z885 Allergy status to narcotic agent status: Secondary | ICD-10-CM | POA: Diagnosis not present

## 2016-07-17 DIAGNOSIS — R9431 Abnormal electrocardiogram [ECG] [EKG]: Secondary | ICD-10-CM | POA: Diagnosis not present

## 2016-07-17 DIAGNOSIS — I9789 Other postprocedural complications and disorders of the circulatory system, not elsewhere classified: Secondary | ICD-10-CM | POA: Diagnosis not present

## 2016-07-17 DIAGNOSIS — Z23 Encounter for immunization: Secondary | ICD-10-CM | POA: Diagnosis not present

## 2016-07-17 DIAGNOSIS — Z882 Allergy status to sulfonamides status: Secondary | ICD-10-CM | POA: Diagnosis not present

## 2016-07-17 DIAGNOSIS — Z853 Personal history of malignant neoplasm of breast: Secondary | ICD-10-CM | POA: Diagnosis not present

## 2016-07-17 DIAGNOSIS — Z9889 Other specified postprocedural states: Secondary | ICD-10-CM | POA: Diagnosis not present

## 2016-07-17 DIAGNOSIS — Z79899 Other long term (current) drug therapy: Secondary | ICD-10-CM | POA: Diagnosis not present

## 2016-07-17 DIAGNOSIS — R5082 Postprocedural fever: Secondary | ICD-10-CM | POA: Diagnosis present

## 2016-07-17 DIAGNOSIS — I480 Paroxysmal atrial fibrillation: Secondary | ICD-10-CM | POA: Diagnosis not present

## 2016-07-17 DIAGNOSIS — R935 Abnormal findings on diagnostic imaging of other abdominal regions, including retroperitoneum: Secondary | ICD-10-CM | POA: Diagnosis not present

## 2016-07-17 DIAGNOSIS — J9601 Acute respiratory failure with hypoxia: Secondary | ICD-10-CM | POA: Diagnosis not present

## 2016-07-17 DIAGNOSIS — E785 Hyperlipidemia, unspecified: Secondary | ICD-10-CM | POA: Diagnosis present

## 2016-07-17 DIAGNOSIS — Z433 Encounter for attention to colostomy: Secondary | ICD-10-CM | POA: Diagnosis not present

## 2016-07-17 DIAGNOSIS — M1990 Unspecified osteoarthritis, unspecified site: Secondary | ICD-10-CM | POA: Diagnosis present

## 2016-07-17 DIAGNOSIS — K91872 Postprocedural seroma of a digestive system organ or structure following a digestive system procedure: Secondary | ICD-10-CM | POA: Diagnosis not present

## 2016-07-17 DIAGNOSIS — Z933 Colostomy status: Secondary | ICD-10-CM | POA: Diagnosis not present

## 2016-07-17 DIAGNOSIS — Z7982 Long term (current) use of aspirin: Secondary | ICD-10-CM | POA: Diagnosis not present

## 2016-07-17 DIAGNOSIS — R109 Unspecified abdominal pain: Secondary | ICD-10-CM | POA: Diagnosis not present

## 2016-07-17 DIAGNOSIS — L7632 Postprocedural hematoma of skin and subcutaneous tissue following other procedure: Secondary | ICD-10-CM | POA: Diagnosis not present

## 2016-07-17 DIAGNOSIS — I4891 Unspecified atrial fibrillation: Secondary | ICD-10-CM | POA: Diagnosis not present

## 2016-07-17 DIAGNOSIS — E039 Hypothyroidism, unspecified: Secondary | ICD-10-CM | POA: Diagnosis present

## 2016-07-18 DIAGNOSIS — R109 Unspecified abdominal pain: Secondary | ICD-10-CM

## 2016-07-18 DIAGNOSIS — I480 Paroxysmal atrial fibrillation: Secondary | ICD-10-CM

## 2016-07-18 DIAGNOSIS — J9601 Acute respiratory failure with hypoxia: Secondary | ICD-10-CM

## 2016-07-18 DIAGNOSIS — Z9889 Other specified postprocedural states: Secondary | ICD-10-CM

## 2016-07-30 DIAGNOSIS — K5730 Diverticulosis of large intestine without perforation or abscess without bleeding: Secondary | ICD-10-CM | POA: Diagnosis not present

## 2016-07-30 DIAGNOSIS — I480 Paroxysmal atrial fibrillation: Secondary | ICD-10-CM | POA: Diagnosis not present

## 2016-08-01 DIAGNOSIS — Z853 Personal history of malignant neoplasm of breast: Secondary | ICD-10-CM | POA: Diagnosis not present

## 2016-08-01 DIAGNOSIS — Z48815 Encounter for surgical aftercare following surgery on the digestive system: Secondary | ICD-10-CM | POA: Diagnosis not present

## 2016-08-01 DIAGNOSIS — I480 Paroxysmal atrial fibrillation: Secondary | ICD-10-CM | POA: Diagnosis not present

## 2016-08-01 DIAGNOSIS — Z7982 Long term (current) use of aspirin: Secondary | ICD-10-CM | POA: Diagnosis not present

## 2016-08-01 DIAGNOSIS — Z79811 Long term (current) use of aromatase inhibitors: Secondary | ICD-10-CM | POA: Diagnosis not present

## 2016-08-01 DIAGNOSIS — M169 Osteoarthritis of hip, unspecified: Secondary | ICD-10-CM | POA: Diagnosis not present

## 2016-08-03 DIAGNOSIS — Z79811 Long term (current) use of aromatase inhibitors: Secondary | ICD-10-CM | POA: Diagnosis not present

## 2016-08-03 DIAGNOSIS — I480 Paroxysmal atrial fibrillation: Secondary | ICD-10-CM | POA: Diagnosis not present

## 2016-08-03 DIAGNOSIS — M169 Osteoarthritis of hip, unspecified: Secondary | ICD-10-CM | POA: Diagnosis not present

## 2016-08-03 DIAGNOSIS — Z48815 Encounter for surgical aftercare following surgery on the digestive system: Secondary | ICD-10-CM | POA: Diagnosis not present

## 2016-08-03 DIAGNOSIS — Z7982 Long term (current) use of aspirin: Secondary | ICD-10-CM | POA: Diagnosis not present

## 2016-08-03 DIAGNOSIS — Z853 Personal history of malignant neoplasm of breast: Secondary | ICD-10-CM | POA: Diagnosis not present

## 2016-08-05 DIAGNOSIS — Z7982 Long term (current) use of aspirin: Secondary | ICD-10-CM | POA: Diagnosis not present

## 2016-08-05 DIAGNOSIS — Z48815 Encounter for surgical aftercare following surgery on the digestive system: Secondary | ICD-10-CM | POA: Diagnosis not present

## 2016-08-05 DIAGNOSIS — Z853 Personal history of malignant neoplasm of breast: Secondary | ICD-10-CM | POA: Diagnosis not present

## 2016-08-05 DIAGNOSIS — I480 Paroxysmal atrial fibrillation: Secondary | ICD-10-CM | POA: Diagnosis not present

## 2016-08-05 DIAGNOSIS — M169 Osteoarthritis of hip, unspecified: Secondary | ICD-10-CM | POA: Diagnosis not present

## 2016-08-05 DIAGNOSIS — Z79811 Long term (current) use of aromatase inhibitors: Secondary | ICD-10-CM | POA: Diagnosis not present

## 2016-08-07 DIAGNOSIS — Z79811 Long term (current) use of aromatase inhibitors: Secondary | ICD-10-CM | POA: Diagnosis not present

## 2016-08-07 DIAGNOSIS — Z853 Personal history of malignant neoplasm of breast: Secondary | ICD-10-CM | POA: Diagnosis not present

## 2016-08-07 DIAGNOSIS — I480 Paroxysmal atrial fibrillation: Secondary | ICD-10-CM | POA: Diagnosis not present

## 2016-08-07 DIAGNOSIS — Z7982 Long term (current) use of aspirin: Secondary | ICD-10-CM | POA: Diagnosis not present

## 2016-08-07 DIAGNOSIS — Z48815 Encounter for surgical aftercare following surgery on the digestive system: Secondary | ICD-10-CM | POA: Diagnosis not present

## 2016-08-07 DIAGNOSIS — M169 Osteoarthritis of hip, unspecified: Secondary | ICD-10-CM | POA: Diagnosis not present

## 2016-08-09 DIAGNOSIS — Z853 Personal history of malignant neoplasm of breast: Secondary | ICD-10-CM | POA: Diagnosis not present

## 2016-08-09 DIAGNOSIS — Z7982 Long term (current) use of aspirin: Secondary | ICD-10-CM | POA: Diagnosis not present

## 2016-08-09 DIAGNOSIS — M169 Osteoarthritis of hip, unspecified: Secondary | ICD-10-CM | POA: Diagnosis not present

## 2016-08-09 DIAGNOSIS — Z79811 Long term (current) use of aromatase inhibitors: Secondary | ICD-10-CM | POA: Diagnosis not present

## 2016-08-09 DIAGNOSIS — Z48815 Encounter for surgical aftercare following surgery on the digestive system: Secondary | ICD-10-CM | POA: Diagnosis not present

## 2016-08-09 DIAGNOSIS — I480 Paroxysmal atrial fibrillation: Secondary | ICD-10-CM | POA: Diagnosis not present

## 2016-08-11 DIAGNOSIS — Z79811 Long term (current) use of aromatase inhibitors: Secondary | ICD-10-CM | POA: Diagnosis not present

## 2016-08-11 DIAGNOSIS — Z853 Personal history of malignant neoplasm of breast: Secondary | ICD-10-CM | POA: Diagnosis not present

## 2016-08-11 DIAGNOSIS — M169 Osteoarthritis of hip, unspecified: Secondary | ICD-10-CM | POA: Diagnosis not present

## 2016-08-11 DIAGNOSIS — Z48815 Encounter for surgical aftercare following surgery on the digestive system: Secondary | ICD-10-CM | POA: Diagnosis not present

## 2016-08-11 DIAGNOSIS — I480 Paroxysmal atrial fibrillation: Secondary | ICD-10-CM | POA: Diagnosis not present

## 2016-08-11 DIAGNOSIS — Z7982 Long term (current) use of aspirin: Secondary | ICD-10-CM | POA: Diagnosis not present

## 2016-08-15 DIAGNOSIS — I480 Paroxysmal atrial fibrillation: Secondary | ICD-10-CM | POA: Diagnosis not present

## 2016-08-15 DIAGNOSIS — Z48815 Encounter for surgical aftercare following surgery on the digestive system: Secondary | ICD-10-CM | POA: Diagnosis not present

## 2016-08-15 DIAGNOSIS — Z853 Personal history of malignant neoplasm of breast: Secondary | ICD-10-CM | POA: Diagnosis not present

## 2016-08-15 DIAGNOSIS — M169 Osteoarthritis of hip, unspecified: Secondary | ICD-10-CM | POA: Diagnosis not present

## 2016-08-15 DIAGNOSIS — Z79811 Long term (current) use of aromatase inhibitors: Secondary | ICD-10-CM | POA: Diagnosis not present

## 2016-08-15 DIAGNOSIS — Z7982 Long term (current) use of aspirin: Secondary | ICD-10-CM | POA: Diagnosis not present

## 2016-08-17 DIAGNOSIS — I480 Paroxysmal atrial fibrillation: Secondary | ICD-10-CM | POA: Diagnosis not present

## 2016-08-17 DIAGNOSIS — Z48815 Encounter for surgical aftercare following surgery on the digestive system: Secondary | ICD-10-CM | POA: Diagnosis not present

## 2016-08-17 DIAGNOSIS — M169 Osteoarthritis of hip, unspecified: Secondary | ICD-10-CM | POA: Diagnosis not present

## 2016-08-17 DIAGNOSIS — Z79811 Long term (current) use of aromatase inhibitors: Secondary | ICD-10-CM | POA: Diagnosis not present

## 2016-08-17 DIAGNOSIS — Z7982 Long term (current) use of aspirin: Secondary | ICD-10-CM | POA: Diagnosis not present

## 2016-08-17 DIAGNOSIS — Z853 Personal history of malignant neoplasm of breast: Secondary | ICD-10-CM | POA: Diagnosis not present

## 2016-08-19 DIAGNOSIS — Z48815 Encounter for surgical aftercare following surgery on the digestive system: Secondary | ICD-10-CM | POA: Diagnosis not present

## 2016-08-19 DIAGNOSIS — Z7982 Long term (current) use of aspirin: Secondary | ICD-10-CM | POA: Diagnosis not present

## 2016-08-19 DIAGNOSIS — I480 Paroxysmal atrial fibrillation: Secondary | ICD-10-CM | POA: Diagnosis not present

## 2016-08-19 DIAGNOSIS — M169 Osteoarthritis of hip, unspecified: Secondary | ICD-10-CM | POA: Diagnosis not present

## 2016-08-19 DIAGNOSIS — Z79811 Long term (current) use of aromatase inhibitors: Secondary | ICD-10-CM | POA: Diagnosis not present

## 2016-08-19 DIAGNOSIS — Z853 Personal history of malignant neoplasm of breast: Secondary | ICD-10-CM | POA: Diagnosis not present

## 2016-08-21 DIAGNOSIS — Z7982 Long term (current) use of aspirin: Secondary | ICD-10-CM | POA: Diagnosis not present

## 2016-08-21 DIAGNOSIS — Z853 Personal history of malignant neoplasm of breast: Secondary | ICD-10-CM | POA: Diagnosis not present

## 2016-08-21 DIAGNOSIS — Z48815 Encounter for surgical aftercare following surgery on the digestive system: Secondary | ICD-10-CM | POA: Diagnosis not present

## 2016-08-21 DIAGNOSIS — Z79811 Long term (current) use of aromatase inhibitors: Secondary | ICD-10-CM | POA: Diagnosis not present

## 2016-08-21 DIAGNOSIS — I480 Paroxysmal atrial fibrillation: Secondary | ICD-10-CM | POA: Diagnosis not present

## 2016-08-21 DIAGNOSIS — M169 Osteoarthritis of hip, unspecified: Secondary | ICD-10-CM | POA: Diagnosis not present

## 2016-08-23 DIAGNOSIS — M169 Osteoarthritis of hip, unspecified: Secondary | ICD-10-CM | POA: Diagnosis not present

## 2016-08-23 DIAGNOSIS — Z853 Personal history of malignant neoplasm of breast: Secondary | ICD-10-CM | POA: Diagnosis not present

## 2016-08-23 DIAGNOSIS — Z7982 Long term (current) use of aspirin: Secondary | ICD-10-CM | POA: Diagnosis not present

## 2016-08-23 DIAGNOSIS — Z48815 Encounter for surgical aftercare following surgery on the digestive system: Secondary | ICD-10-CM | POA: Diagnosis not present

## 2016-08-23 DIAGNOSIS — I480 Paroxysmal atrial fibrillation: Secondary | ICD-10-CM | POA: Diagnosis not present

## 2016-08-23 DIAGNOSIS — Z79811 Long term (current) use of aromatase inhibitors: Secondary | ICD-10-CM | POA: Diagnosis not present

## 2016-08-25 DIAGNOSIS — Z79811 Long term (current) use of aromatase inhibitors: Secondary | ICD-10-CM | POA: Diagnosis not present

## 2016-08-25 DIAGNOSIS — Z48815 Encounter for surgical aftercare following surgery on the digestive system: Secondary | ICD-10-CM | POA: Diagnosis not present

## 2016-08-25 DIAGNOSIS — Z7982 Long term (current) use of aspirin: Secondary | ICD-10-CM | POA: Diagnosis not present

## 2016-08-25 DIAGNOSIS — I480 Paroxysmal atrial fibrillation: Secondary | ICD-10-CM | POA: Diagnosis not present

## 2016-08-25 DIAGNOSIS — M169 Osteoarthritis of hip, unspecified: Secondary | ICD-10-CM | POA: Diagnosis not present

## 2016-08-25 DIAGNOSIS — Z853 Personal history of malignant neoplasm of breast: Secondary | ICD-10-CM | POA: Diagnosis not present

## 2016-08-27 DIAGNOSIS — I480 Paroxysmal atrial fibrillation: Secondary | ICD-10-CM | POA: Diagnosis not present

## 2016-08-27 DIAGNOSIS — M169 Osteoarthritis of hip, unspecified: Secondary | ICD-10-CM | POA: Diagnosis not present

## 2016-08-27 DIAGNOSIS — Z48815 Encounter for surgical aftercare following surgery on the digestive system: Secondary | ICD-10-CM | POA: Diagnosis not present

## 2016-08-27 DIAGNOSIS — Z79811 Long term (current) use of aromatase inhibitors: Secondary | ICD-10-CM | POA: Diagnosis not present

## 2016-08-27 DIAGNOSIS — Z853 Personal history of malignant neoplasm of breast: Secondary | ICD-10-CM | POA: Diagnosis not present

## 2016-08-27 DIAGNOSIS — Z7982 Long term (current) use of aspirin: Secondary | ICD-10-CM | POA: Diagnosis not present

## 2016-08-29 DIAGNOSIS — I480 Paroxysmal atrial fibrillation: Secondary | ICD-10-CM | POA: Diagnosis not present

## 2016-08-29 DIAGNOSIS — Z853 Personal history of malignant neoplasm of breast: Secondary | ICD-10-CM | POA: Diagnosis not present

## 2016-08-29 DIAGNOSIS — Z48815 Encounter for surgical aftercare following surgery on the digestive system: Secondary | ICD-10-CM | POA: Diagnosis not present

## 2016-08-29 DIAGNOSIS — Z79811 Long term (current) use of aromatase inhibitors: Secondary | ICD-10-CM | POA: Diagnosis not present

## 2016-08-29 DIAGNOSIS — M169 Osteoarthritis of hip, unspecified: Secondary | ICD-10-CM | POA: Diagnosis not present

## 2016-08-29 DIAGNOSIS — Z7982 Long term (current) use of aspirin: Secondary | ICD-10-CM | POA: Diagnosis not present

## 2016-08-31 DIAGNOSIS — I480 Paroxysmal atrial fibrillation: Secondary | ICD-10-CM | POA: Diagnosis not present

## 2016-08-31 DIAGNOSIS — Z7982 Long term (current) use of aspirin: Secondary | ICD-10-CM | POA: Diagnosis not present

## 2016-08-31 DIAGNOSIS — M169 Osteoarthritis of hip, unspecified: Secondary | ICD-10-CM | POA: Diagnosis not present

## 2016-08-31 DIAGNOSIS — Z48815 Encounter for surgical aftercare following surgery on the digestive system: Secondary | ICD-10-CM | POA: Diagnosis not present

## 2016-08-31 DIAGNOSIS — Z79811 Long term (current) use of aromatase inhibitors: Secondary | ICD-10-CM | POA: Diagnosis not present

## 2016-08-31 DIAGNOSIS — Z853 Personal history of malignant neoplasm of breast: Secondary | ICD-10-CM | POA: Diagnosis not present

## 2016-09-02 DIAGNOSIS — Z79811 Long term (current) use of aromatase inhibitors: Secondary | ICD-10-CM | POA: Diagnosis not present

## 2016-09-02 DIAGNOSIS — Z853 Personal history of malignant neoplasm of breast: Secondary | ICD-10-CM | POA: Diagnosis not present

## 2016-09-02 DIAGNOSIS — Z7982 Long term (current) use of aspirin: Secondary | ICD-10-CM | POA: Diagnosis not present

## 2016-09-02 DIAGNOSIS — Z48815 Encounter for surgical aftercare following surgery on the digestive system: Secondary | ICD-10-CM | POA: Diagnosis not present

## 2016-09-02 DIAGNOSIS — M169 Osteoarthritis of hip, unspecified: Secondary | ICD-10-CM | POA: Diagnosis not present

## 2016-09-02 DIAGNOSIS — I480 Paroxysmal atrial fibrillation: Secondary | ICD-10-CM | POA: Diagnosis not present

## 2016-09-04 DIAGNOSIS — Z853 Personal history of malignant neoplasm of breast: Secondary | ICD-10-CM | POA: Diagnosis not present

## 2016-09-04 DIAGNOSIS — Z48815 Encounter for surgical aftercare following surgery on the digestive system: Secondary | ICD-10-CM | POA: Diagnosis not present

## 2016-09-04 DIAGNOSIS — I480 Paroxysmal atrial fibrillation: Secondary | ICD-10-CM | POA: Diagnosis not present

## 2016-09-04 DIAGNOSIS — M169 Osteoarthritis of hip, unspecified: Secondary | ICD-10-CM | POA: Diagnosis not present

## 2016-09-04 DIAGNOSIS — Z7982 Long term (current) use of aspirin: Secondary | ICD-10-CM | POA: Diagnosis not present

## 2016-09-04 DIAGNOSIS — Z79811 Long term (current) use of aromatase inhibitors: Secondary | ICD-10-CM | POA: Diagnosis not present

## 2016-09-06 DIAGNOSIS — Z48815 Encounter for surgical aftercare following surgery on the digestive system: Secondary | ICD-10-CM | POA: Diagnosis not present

## 2016-09-06 DIAGNOSIS — M169 Osteoarthritis of hip, unspecified: Secondary | ICD-10-CM | POA: Diagnosis not present

## 2016-09-06 DIAGNOSIS — Z7982 Long term (current) use of aspirin: Secondary | ICD-10-CM | POA: Diagnosis not present

## 2016-09-06 DIAGNOSIS — I480 Paroxysmal atrial fibrillation: Secondary | ICD-10-CM | POA: Diagnosis not present

## 2016-09-06 DIAGNOSIS — Z853 Personal history of malignant neoplasm of breast: Secondary | ICD-10-CM | POA: Diagnosis not present

## 2016-09-06 DIAGNOSIS — Z79811 Long term (current) use of aromatase inhibitors: Secondary | ICD-10-CM | POA: Diagnosis not present

## 2016-09-08 DIAGNOSIS — Z7982 Long term (current) use of aspirin: Secondary | ICD-10-CM | POA: Diagnosis not present

## 2016-09-08 DIAGNOSIS — M169 Osteoarthritis of hip, unspecified: Secondary | ICD-10-CM | POA: Diagnosis not present

## 2016-09-08 DIAGNOSIS — Z853 Personal history of malignant neoplasm of breast: Secondary | ICD-10-CM | POA: Diagnosis not present

## 2016-09-08 DIAGNOSIS — I480 Paroxysmal atrial fibrillation: Secondary | ICD-10-CM | POA: Diagnosis not present

## 2016-09-08 DIAGNOSIS — Z48815 Encounter for surgical aftercare following surgery on the digestive system: Secondary | ICD-10-CM | POA: Diagnosis not present

## 2016-09-08 DIAGNOSIS — Z79811 Long term (current) use of aromatase inhibitors: Secondary | ICD-10-CM | POA: Diagnosis not present

## 2016-09-12 DIAGNOSIS — M169 Osteoarthritis of hip, unspecified: Secondary | ICD-10-CM | POA: Diagnosis not present

## 2016-09-12 DIAGNOSIS — Z79811 Long term (current) use of aromatase inhibitors: Secondary | ICD-10-CM | POA: Diagnosis not present

## 2016-09-12 DIAGNOSIS — Z48815 Encounter for surgical aftercare following surgery on the digestive system: Secondary | ICD-10-CM | POA: Diagnosis not present

## 2016-09-12 DIAGNOSIS — I480 Paroxysmal atrial fibrillation: Secondary | ICD-10-CM | POA: Diagnosis not present

## 2016-09-12 DIAGNOSIS — Z853 Personal history of malignant neoplasm of breast: Secondary | ICD-10-CM | POA: Diagnosis not present

## 2016-09-12 DIAGNOSIS — Z7982 Long term (current) use of aspirin: Secondary | ICD-10-CM | POA: Diagnosis not present

## 2016-09-14 DIAGNOSIS — Z853 Personal history of malignant neoplasm of breast: Secondary | ICD-10-CM | POA: Diagnosis not present

## 2016-09-14 DIAGNOSIS — I480 Paroxysmal atrial fibrillation: Secondary | ICD-10-CM | POA: Diagnosis not present

## 2016-09-14 DIAGNOSIS — Z79811 Long term (current) use of aromatase inhibitors: Secondary | ICD-10-CM | POA: Diagnosis not present

## 2016-09-14 DIAGNOSIS — Z48815 Encounter for surgical aftercare following surgery on the digestive system: Secondary | ICD-10-CM | POA: Diagnosis not present

## 2016-09-14 DIAGNOSIS — Z7982 Long term (current) use of aspirin: Secondary | ICD-10-CM | POA: Diagnosis not present

## 2016-09-14 DIAGNOSIS — M169 Osteoarthritis of hip, unspecified: Secondary | ICD-10-CM | POA: Diagnosis not present

## 2016-09-16 DIAGNOSIS — Z7982 Long term (current) use of aspirin: Secondary | ICD-10-CM | POA: Diagnosis not present

## 2016-09-16 DIAGNOSIS — Z853 Personal history of malignant neoplasm of breast: Secondary | ICD-10-CM | POA: Diagnosis not present

## 2016-09-16 DIAGNOSIS — Z79811 Long term (current) use of aromatase inhibitors: Secondary | ICD-10-CM | POA: Diagnosis not present

## 2016-09-16 DIAGNOSIS — M169 Osteoarthritis of hip, unspecified: Secondary | ICD-10-CM | POA: Diagnosis not present

## 2016-09-16 DIAGNOSIS — Z48815 Encounter for surgical aftercare following surgery on the digestive system: Secondary | ICD-10-CM | POA: Diagnosis not present

## 2016-09-16 DIAGNOSIS — I480 Paroxysmal atrial fibrillation: Secondary | ICD-10-CM | POA: Diagnosis not present

## 2016-09-18 DIAGNOSIS — Z48815 Encounter for surgical aftercare following surgery on the digestive system: Secondary | ICD-10-CM | POA: Diagnosis not present

## 2016-09-18 DIAGNOSIS — Z79811 Long term (current) use of aromatase inhibitors: Secondary | ICD-10-CM | POA: Diagnosis not present

## 2016-09-18 DIAGNOSIS — Z7982 Long term (current) use of aspirin: Secondary | ICD-10-CM | POA: Diagnosis not present

## 2016-09-18 DIAGNOSIS — Z853 Personal history of malignant neoplasm of breast: Secondary | ICD-10-CM | POA: Diagnosis not present

## 2016-09-18 DIAGNOSIS — I480 Paroxysmal atrial fibrillation: Secondary | ICD-10-CM | POA: Diagnosis not present

## 2016-09-18 DIAGNOSIS — M169 Osteoarthritis of hip, unspecified: Secondary | ICD-10-CM | POA: Diagnosis not present

## 2016-09-20 DIAGNOSIS — Z7982 Long term (current) use of aspirin: Secondary | ICD-10-CM | POA: Diagnosis not present

## 2016-09-20 DIAGNOSIS — Z853 Personal history of malignant neoplasm of breast: Secondary | ICD-10-CM | POA: Diagnosis not present

## 2016-09-20 DIAGNOSIS — I480 Paroxysmal atrial fibrillation: Secondary | ICD-10-CM | POA: Diagnosis not present

## 2016-09-20 DIAGNOSIS — Z48815 Encounter for surgical aftercare following surgery on the digestive system: Secondary | ICD-10-CM | POA: Diagnosis not present

## 2016-09-20 DIAGNOSIS — M169 Osteoarthritis of hip, unspecified: Secondary | ICD-10-CM | POA: Diagnosis not present

## 2016-09-20 DIAGNOSIS — Z79811 Long term (current) use of aromatase inhibitors: Secondary | ICD-10-CM | POA: Diagnosis not present

## 2016-09-22 DIAGNOSIS — M169 Osteoarthritis of hip, unspecified: Secondary | ICD-10-CM | POA: Diagnosis not present

## 2016-09-22 DIAGNOSIS — I480 Paroxysmal atrial fibrillation: Secondary | ICD-10-CM | POA: Diagnosis not present

## 2016-09-22 DIAGNOSIS — Z7982 Long term (current) use of aspirin: Secondary | ICD-10-CM | POA: Diagnosis not present

## 2016-09-22 DIAGNOSIS — Z79811 Long term (current) use of aromatase inhibitors: Secondary | ICD-10-CM | POA: Diagnosis not present

## 2016-09-22 DIAGNOSIS — Z853 Personal history of malignant neoplasm of breast: Secondary | ICD-10-CM | POA: Diagnosis not present

## 2016-09-22 DIAGNOSIS — Z48815 Encounter for surgical aftercare following surgery on the digestive system: Secondary | ICD-10-CM | POA: Diagnosis not present

## 2016-09-24 DIAGNOSIS — Z79811 Long term (current) use of aromatase inhibitors: Secondary | ICD-10-CM | POA: Diagnosis not present

## 2016-09-24 DIAGNOSIS — Z853 Personal history of malignant neoplasm of breast: Secondary | ICD-10-CM | POA: Diagnosis not present

## 2016-09-24 DIAGNOSIS — M169 Osteoarthritis of hip, unspecified: Secondary | ICD-10-CM | POA: Diagnosis not present

## 2016-09-24 DIAGNOSIS — Z7982 Long term (current) use of aspirin: Secondary | ICD-10-CM | POA: Diagnosis not present

## 2016-09-24 DIAGNOSIS — I480 Paroxysmal atrial fibrillation: Secondary | ICD-10-CM | POA: Diagnosis not present

## 2016-09-24 DIAGNOSIS — Z48815 Encounter for surgical aftercare following surgery on the digestive system: Secondary | ICD-10-CM | POA: Diagnosis not present

## 2016-09-26 DIAGNOSIS — Z48815 Encounter for surgical aftercare following surgery on the digestive system: Secondary | ICD-10-CM | POA: Diagnosis not present

## 2016-09-26 DIAGNOSIS — M169 Osteoarthritis of hip, unspecified: Secondary | ICD-10-CM | POA: Diagnosis not present

## 2016-09-26 DIAGNOSIS — Z79811 Long term (current) use of aromatase inhibitors: Secondary | ICD-10-CM | POA: Diagnosis not present

## 2016-09-26 DIAGNOSIS — I480 Paroxysmal atrial fibrillation: Secondary | ICD-10-CM | POA: Diagnosis not present

## 2016-09-26 DIAGNOSIS — Z853 Personal history of malignant neoplasm of breast: Secondary | ICD-10-CM | POA: Diagnosis not present

## 2016-09-26 DIAGNOSIS — Z7982 Long term (current) use of aspirin: Secondary | ICD-10-CM | POA: Diagnosis not present

## 2016-09-28 DIAGNOSIS — Z7982 Long term (current) use of aspirin: Secondary | ICD-10-CM | POA: Diagnosis not present

## 2016-09-28 DIAGNOSIS — Z853 Personal history of malignant neoplasm of breast: Secondary | ICD-10-CM | POA: Diagnosis not present

## 2016-09-28 DIAGNOSIS — Z48815 Encounter for surgical aftercare following surgery on the digestive system: Secondary | ICD-10-CM | POA: Diagnosis not present

## 2016-09-28 DIAGNOSIS — M169 Osteoarthritis of hip, unspecified: Secondary | ICD-10-CM | POA: Diagnosis not present

## 2016-09-28 DIAGNOSIS — I480 Paroxysmal atrial fibrillation: Secondary | ICD-10-CM | POA: Diagnosis not present

## 2016-09-28 DIAGNOSIS — Z79811 Long term (current) use of aromatase inhibitors: Secondary | ICD-10-CM | POA: Diagnosis not present

## 2016-09-29 DIAGNOSIS — M169 Osteoarthritis of hip, unspecified: Secondary | ICD-10-CM | POA: Diagnosis not present

## 2016-09-29 DIAGNOSIS — I480 Paroxysmal atrial fibrillation: Secondary | ICD-10-CM | POA: Diagnosis not present

## 2016-09-29 DIAGNOSIS — Z79811 Long term (current) use of aromatase inhibitors: Secondary | ICD-10-CM | POA: Diagnosis not present

## 2016-09-29 DIAGNOSIS — Z853 Personal history of malignant neoplasm of breast: Secondary | ICD-10-CM | POA: Diagnosis not present

## 2016-09-29 DIAGNOSIS — Z48815 Encounter for surgical aftercare following surgery on the digestive system: Secondary | ICD-10-CM | POA: Diagnosis not present

## 2016-09-29 DIAGNOSIS — Z7982 Long term (current) use of aspirin: Secondary | ICD-10-CM | POA: Diagnosis not present

## 2016-09-30 DIAGNOSIS — Z48815 Encounter for surgical aftercare following surgery on the digestive system: Secondary | ICD-10-CM | POA: Diagnosis not present

## 2016-09-30 DIAGNOSIS — Z853 Personal history of malignant neoplasm of breast: Secondary | ICD-10-CM | POA: Diagnosis not present

## 2016-09-30 DIAGNOSIS — Z79811 Long term (current) use of aromatase inhibitors: Secondary | ICD-10-CM | POA: Diagnosis not present

## 2016-09-30 DIAGNOSIS — I480 Paroxysmal atrial fibrillation: Secondary | ICD-10-CM | POA: Diagnosis not present

## 2016-09-30 DIAGNOSIS — Z7982 Long term (current) use of aspirin: Secondary | ICD-10-CM | POA: Diagnosis not present

## 2016-09-30 DIAGNOSIS — M169 Osteoarthritis of hip, unspecified: Secondary | ICD-10-CM | POA: Diagnosis not present

## 2016-10-01 DIAGNOSIS — Z48815 Encounter for surgical aftercare following surgery on the digestive system: Secondary | ICD-10-CM | POA: Diagnosis not present

## 2016-10-01 DIAGNOSIS — M169 Osteoarthritis of hip, unspecified: Secondary | ICD-10-CM | POA: Diagnosis not present

## 2016-10-01 DIAGNOSIS — Z7982 Long term (current) use of aspirin: Secondary | ICD-10-CM | POA: Diagnosis not present

## 2016-10-01 DIAGNOSIS — Z853 Personal history of malignant neoplasm of breast: Secondary | ICD-10-CM | POA: Diagnosis not present

## 2016-10-01 DIAGNOSIS — I480 Paroxysmal atrial fibrillation: Secondary | ICD-10-CM | POA: Diagnosis not present

## 2016-10-01 DIAGNOSIS — Z79811 Long term (current) use of aromatase inhibitors: Secondary | ICD-10-CM | POA: Diagnosis not present

## 2016-10-03 DIAGNOSIS — I480 Paroxysmal atrial fibrillation: Secondary | ICD-10-CM | POA: Diagnosis not present

## 2016-10-03 DIAGNOSIS — Z7982 Long term (current) use of aspirin: Secondary | ICD-10-CM | POA: Diagnosis not present

## 2016-10-03 DIAGNOSIS — Z79811 Long term (current) use of aromatase inhibitors: Secondary | ICD-10-CM | POA: Diagnosis not present

## 2016-10-03 DIAGNOSIS — M169 Osteoarthritis of hip, unspecified: Secondary | ICD-10-CM | POA: Diagnosis not present

## 2016-10-03 DIAGNOSIS — Z853 Personal history of malignant neoplasm of breast: Secondary | ICD-10-CM | POA: Diagnosis not present

## 2016-10-03 DIAGNOSIS — Z48815 Encounter for surgical aftercare following surgery on the digestive system: Secondary | ICD-10-CM | POA: Diagnosis not present

## 2016-10-05 DIAGNOSIS — I480 Paroxysmal atrial fibrillation: Secondary | ICD-10-CM | POA: Diagnosis not present

## 2016-10-05 DIAGNOSIS — Z48815 Encounter for surgical aftercare following surgery on the digestive system: Secondary | ICD-10-CM | POA: Diagnosis not present

## 2016-10-05 DIAGNOSIS — M169 Osteoarthritis of hip, unspecified: Secondary | ICD-10-CM | POA: Diagnosis not present

## 2016-10-05 DIAGNOSIS — Z79811 Long term (current) use of aromatase inhibitors: Secondary | ICD-10-CM | POA: Diagnosis not present

## 2016-10-05 DIAGNOSIS — Z7982 Long term (current) use of aspirin: Secondary | ICD-10-CM | POA: Diagnosis not present

## 2016-10-05 DIAGNOSIS — Z853 Personal history of malignant neoplasm of breast: Secondary | ICD-10-CM | POA: Diagnosis not present

## 2016-10-08 DIAGNOSIS — Z79811 Long term (current) use of aromatase inhibitors: Secondary | ICD-10-CM | POA: Diagnosis not present

## 2016-10-08 DIAGNOSIS — Z853 Personal history of malignant neoplasm of breast: Secondary | ICD-10-CM | POA: Diagnosis not present

## 2016-10-08 DIAGNOSIS — I480 Paroxysmal atrial fibrillation: Secondary | ICD-10-CM | POA: Diagnosis not present

## 2016-10-08 DIAGNOSIS — Z7982 Long term (current) use of aspirin: Secondary | ICD-10-CM | POA: Diagnosis not present

## 2016-10-08 DIAGNOSIS — Z48815 Encounter for surgical aftercare following surgery on the digestive system: Secondary | ICD-10-CM | POA: Diagnosis not present

## 2016-10-08 DIAGNOSIS — M169 Osteoarthritis of hip, unspecified: Secondary | ICD-10-CM | POA: Diagnosis not present

## 2016-10-11 DIAGNOSIS — Z79811 Long term (current) use of aromatase inhibitors: Secondary | ICD-10-CM | POA: Diagnosis not present

## 2016-10-11 DIAGNOSIS — Z853 Personal history of malignant neoplasm of breast: Secondary | ICD-10-CM | POA: Diagnosis not present

## 2016-10-11 DIAGNOSIS — Z7982 Long term (current) use of aspirin: Secondary | ICD-10-CM | POA: Diagnosis not present

## 2016-10-11 DIAGNOSIS — Z48815 Encounter for surgical aftercare following surgery on the digestive system: Secondary | ICD-10-CM | POA: Diagnosis not present

## 2016-10-11 DIAGNOSIS — M169 Osteoarthritis of hip, unspecified: Secondary | ICD-10-CM | POA: Diagnosis not present

## 2016-10-11 DIAGNOSIS — I480 Paroxysmal atrial fibrillation: Secondary | ICD-10-CM | POA: Diagnosis not present

## 2016-10-13 DIAGNOSIS — M169 Osteoarthritis of hip, unspecified: Secondary | ICD-10-CM | POA: Diagnosis not present

## 2016-10-13 DIAGNOSIS — Z48815 Encounter for surgical aftercare following surgery on the digestive system: Secondary | ICD-10-CM | POA: Diagnosis not present

## 2016-10-13 DIAGNOSIS — Z7982 Long term (current) use of aspirin: Secondary | ICD-10-CM | POA: Diagnosis not present

## 2016-10-13 DIAGNOSIS — Z853 Personal history of malignant neoplasm of breast: Secondary | ICD-10-CM | POA: Diagnosis not present

## 2016-10-13 DIAGNOSIS — Z79811 Long term (current) use of aromatase inhibitors: Secondary | ICD-10-CM | POA: Diagnosis not present

## 2016-10-13 DIAGNOSIS — I480 Paroxysmal atrial fibrillation: Secondary | ICD-10-CM | POA: Diagnosis not present

## 2016-10-15 DIAGNOSIS — Z48815 Encounter for surgical aftercare following surgery on the digestive system: Secondary | ICD-10-CM | POA: Diagnosis not present

## 2016-10-15 DIAGNOSIS — Z7982 Long term (current) use of aspirin: Secondary | ICD-10-CM | POA: Diagnosis not present

## 2016-10-15 DIAGNOSIS — I480 Paroxysmal atrial fibrillation: Secondary | ICD-10-CM | POA: Diagnosis not present

## 2016-10-15 DIAGNOSIS — Z79811 Long term (current) use of aromatase inhibitors: Secondary | ICD-10-CM | POA: Diagnosis not present

## 2016-10-15 DIAGNOSIS — M169 Osteoarthritis of hip, unspecified: Secondary | ICD-10-CM | POA: Diagnosis not present

## 2016-10-15 DIAGNOSIS — Z853 Personal history of malignant neoplasm of breast: Secondary | ICD-10-CM | POA: Diagnosis not present

## 2016-10-22 DIAGNOSIS — K432 Incisional hernia without obstruction or gangrene: Secondary | ICD-10-CM | POA: Insufficient documentation

## 2016-10-22 HISTORY — DX: Incisional hernia without obstruction or gangrene: K43.2

## 2016-11-04 DIAGNOSIS — Z48815 Encounter for surgical aftercare following surgery on the digestive system: Secondary | ICD-10-CM | POA: Diagnosis not present

## 2016-11-25 DIAGNOSIS — R109 Unspecified abdominal pain: Secondary | ICD-10-CM | POA: Diagnosis not present

## 2016-12-12 DIAGNOSIS — Z170 Estrogen receptor positive status [ER+]: Secondary | ICD-10-CM | POA: Diagnosis not present

## 2016-12-12 DIAGNOSIS — C50912 Malignant neoplasm of unspecified site of left female breast: Secondary | ICD-10-CM | POA: Diagnosis not present

## 2016-12-12 DIAGNOSIS — M8580 Other specified disorders of bone density and structure, unspecified site: Secondary | ICD-10-CM | POA: Diagnosis not present

## 2016-12-12 DIAGNOSIS — Z79811 Long term (current) use of aromatase inhibitors: Secondary | ICD-10-CM | POA: Diagnosis not present

## 2016-12-12 DIAGNOSIS — Z853 Personal history of malignant neoplasm of breast: Secondary | ICD-10-CM | POA: Diagnosis not present

## 2016-12-24 DIAGNOSIS — R109 Unspecified abdominal pain: Secondary | ICD-10-CM | POA: Diagnosis not present

## 2016-12-24 DIAGNOSIS — R143 Flatulence: Secondary | ICD-10-CM | POA: Diagnosis not present

## 2016-12-24 DIAGNOSIS — L659 Nonscarring hair loss, unspecified: Secondary | ICD-10-CM | POA: Diagnosis not present

## 2017-01-29 DIAGNOSIS — I480 Paroxysmal atrial fibrillation: Secondary | ICD-10-CM | POA: Diagnosis not present

## 2017-04-10 DIAGNOSIS — Z23 Encounter for immunization: Secondary | ICD-10-CM | POA: Diagnosis not present

## 2017-04-10 DIAGNOSIS — Z853 Personal history of malignant neoplasm of breast: Secondary | ICD-10-CM | POA: Diagnosis not present

## 2017-04-10 DIAGNOSIS — M810 Age-related osteoporosis without current pathological fracture: Secondary | ICD-10-CM | POA: Diagnosis not present

## 2017-04-10 DIAGNOSIS — Z79899 Other long term (current) drug therapy: Secondary | ICD-10-CM | POA: Diagnosis not present

## 2017-04-10 DIAGNOSIS — Z0000 Encounter for general adult medical examination without abnormal findings: Secondary | ICD-10-CM | POA: Diagnosis not present

## 2017-04-10 DIAGNOSIS — E785 Hyperlipidemia, unspecified: Secondary | ICD-10-CM | POA: Diagnosis not present

## 2017-04-10 DIAGNOSIS — Z1389 Encounter for screening for other disorder: Secondary | ICD-10-CM | POA: Diagnosis not present

## 2017-04-18 DIAGNOSIS — Z79899 Other long term (current) drug therapy: Secondary | ICD-10-CM | POA: Diagnosis not present

## 2017-04-18 DIAGNOSIS — E785 Hyperlipidemia, unspecified: Secondary | ICD-10-CM | POA: Diagnosis not present

## 2017-04-18 DIAGNOSIS — M810 Age-related osteoporosis without current pathological fracture: Secondary | ICD-10-CM | POA: Diagnosis not present

## 2017-05-20 DIAGNOSIS — E039 Hypothyroidism, unspecified: Secondary | ICD-10-CM | POA: Diagnosis not present

## 2017-05-20 DIAGNOSIS — R7989 Other specified abnormal findings of blood chemistry: Secondary | ICD-10-CM | POA: Diagnosis not present

## 2017-06-16 DIAGNOSIS — Z1231 Encounter for screening mammogram for malignant neoplasm of breast: Secondary | ICD-10-CM | POA: Diagnosis not present

## 2017-06-18 DIAGNOSIS — R748 Abnormal levels of other serum enzymes: Secondary | ICD-10-CM | POA: Diagnosis not present

## 2017-06-18 DIAGNOSIS — E860 Dehydration: Secondary | ICD-10-CM | POA: Diagnosis not present

## 2017-06-18 DIAGNOSIS — Z853 Personal history of malignant neoplasm of breast: Secondary | ICD-10-CM | POA: Diagnosis not present

## 2017-07-25 DIAGNOSIS — Z23 Encounter for immunization: Secondary | ICD-10-CM | POA: Diagnosis not present

## 2017-08-11 DIAGNOSIS — H00011 Hordeolum externum right upper eyelid: Secondary | ICD-10-CM | POA: Diagnosis not present

## 2017-08-11 DIAGNOSIS — E039 Hypothyroidism, unspecified: Secondary | ICD-10-CM | POA: Diagnosis not present

## 2017-08-21 DIAGNOSIS — H02821 Cysts of right upper eyelid: Secondary | ICD-10-CM | POA: Diagnosis not present

## 2017-08-22 ENCOUNTER — Ambulatory Visit (INDEPENDENT_AMBULATORY_CARE_PROVIDER_SITE_OTHER): Payer: Medicare Other | Admitting: Cardiology

## 2017-08-22 ENCOUNTER — Encounter: Payer: Self-pay | Admitting: Cardiology

## 2017-08-22 VITALS — BP 130/70 | HR 77 | Ht 63.0 in | Wt 174.1 lb

## 2017-08-22 DIAGNOSIS — I480 Paroxysmal atrial fibrillation: Secondary | ICD-10-CM | POA: Diagnosis not present

## 2017-08-22 MED ORDER — ASPIRIN 325 MG PO TBEC: 325.0000 mg | DELAYED_RELEASE_TABLET | Freq: Every day | ORAL | 0 refills | Status: DC

## 2017-08-22 NOTE — Patient Instructions (Signed)
Medication Instructions:  Your physician recommends that you continue on your current medications as directed. Please refer to the Current Medication list given to you today.  Labwork: None  Testing/Procedures: None  Follow-Up: Your physician recommends that you schedule a follow-up appointment in: 6 month  Any Other Special Instructions Will Be Listed Below (If Applicable).   If you need a refill on your cardiac medications before your next appointment, please call your pharmacy.   [place], RN, BSN

## 2017-08-22 NOTE — Addendum Note (Signed)
Addended by: Mattie Marlin on: 08/22/2017 09:42 AM   Modules accepted: Orders

## 2017-08-22 NOTE — Progress Notes (Signed)
Cardiology Office Note:    Date:  08/22/2017   ID:  Carrie Wilkerson, DOB [birth year], MRN [id number]  PCP:  Carrie Kiel, MD  Cardiologist:  Jenean Lindau, MD   Referring MD: Carrie Kiel, MD    ASSESSMENT:    1. Paroxysmal atrial fibrillation (HCC)    PLAN:    In order of problems listed above:  1. I discussed with the patient atrial fibrillation, disease process. Management and therapy including rate and rhythm control, anticoagulation benefits and potential risks were discussed extensively with the patient. Patient had multiple questions which were answered to patient's satisfaction. 2. The patient's chads score is 1.  She was explained about anticoagulation.  She is vehemently against it and wants to continue with 325 mg of aspirin and I respect her wishes.  Her chads score is on the lower aspect. 3. Patient will be seen in follow-up appointment in 6 months or earlier if the patient has any concerns. 4. Her blood pressure is stable.  Her lipids are followed by her primary care physician.  Risks of obesity explained and diet was discussed.  Exercise protocol was outlined. 5. Her thyroid functions have been monitored by her primary care physician.   Medication Adjustments/Labs and Tests Ordered: Current medicines are reviewed at length with the patient today.  Concerns regarding medicines are outlined above.  No orders of the defined types were placed in this encounter.  No orders of the defined types were placed in this encounter.    History of Present Illness:    Carrie Wilkerson is a 76 y.o. female who is being seen today for the evaluation of paroxysmal atrial fibrillation at the request of Carrie Kiel, MD.  Patient is a pleasant 76 year old female.  She has past medical history of paroxysmal defibrillation.  She denies any history of essential hypertension, dyslipidemia or diabetes mellitus.  This patient has been under my care in my previous practice.  He  is here now to transfer his care and be established with my current practice.  No chest pain orthopnea or PND.  She leads a sedentary lifestyle.  She is busy with yard work but does not do any aerobic exercise.  Her husband accompanies her today.  At the time of my evaluation, the patient is alert awake oriented and in no distress.  No palpitations or any recurrences of atrial fibrillation in the past several months that she has been seen by me in the past  Past Medical History:  Diagnosis Date  . Arthritis   . Cancer [facility])    Hx: of breast cancer in 2007  . Numbness in right leg    Hx: of  . Pneumonia    Hx: of 'a long time ago"    Past Surgical History:  Procedure Laterality Date  . APPENDECTOMY    . BACK SURGERY    . BREAST SURGERY     Hx: of lumpectomy left breast 2007  . COLONOSCOPY     Hx: of  . DILATION AND CURETTAGE OF UTERUS    . TONSILLECTOMY    . 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 TOTAL HIP ARTHROPLASTY Right 04/13/2013   Procedure: RIGHT TOTAL HIP ARTHROPLASTY ANTERIOR APPROACH;  Surgeon: Mcarthur Rossetti, MD;  Location: [place];  Service: Orthopedics;  Laterality: Right;  . TUBAL LIGATION      Current Medications: Current Meds  Medication Sig  . aspirin EC 325 MG EC tablet Take 1 tablet (325  mg total) by mouth 2 (two) times daily after a meal.  . CALCIUM PO Take 1 tablet by mouth daily.  . cholecalciferol (VITAMIN D) 1000 UNITS tablet Take 1,000 Units by mouth daily.  Marland Kitchen denosumab (PROLIA) 60 MG/ML SOLN injection Inject 60 mg every 6 (six) months into the skin. Administer in upper arm, thigh, or abdomen  . Flaxseed, Linseed, OIL Take 1 capsule by mouth daily.  Marland Kitchen glucosamine-chondroitin 500-400 MG tablet Take 1 tablet by mouth 3 (three) times daily.  Marland Kitchen levothyroxine (SYNTHROID, LEVOTHROID) 25 MCG tablet Take 25 mcg daily by mouth.  . metoprolol tartrate (LOPRESSOR) 25 MG tablet Take 25 mg 2 (two) times daily by mouth.  . Multiple Vitamin  (MULTIVITAMIN WITH MINERALS) TABS Take 1 tablet by mouth daily.  Marland Kitchen PRESCRIPTION MEDICATION Take 1 tablet by mouth daily. Prescription estrogen blocker  . rosuvastatin (CRESTOR) 5 MG tablet Take 5 mg daily by mouth.     Allergies:   Codeine and Sulfa antibiotics   Social History   Socioeconomic History  . Marital status: Married    Spouse name: None  . Number of children: None  . Years of education: None  . Highest education level: None  Social Needs  . Financial resource strain: None  . Food insecurity - worry: None  . Food insecurity - inability: None  . Transportation needs - medical: None  . Transportation needs - non-medical: None  Occupational History  . None  Tobacco Use  . Smoking status: Never Smoker  . Smokeless tobacco: Never Used  Substance and Sexual Activity  . Alcohol use: No  . Drug use: No  . Sexual activity: None  Other Topics Concern  . None  Social History Narrative  . None     Family History: The patient's family history includes Cancer in her other; Hypertension in her brother and sister.  ROS:   Please see the history of present illness.    All other systems reviewed and are negative.  EKGs/Labs/Other Studies Reviewed:    The following studies were reviewed today: I reviewed her records extensively.  Previous records were reviewed.  EKG reveals sinus rhythm and nonspecific ST-T changes.   Recent Labs: No results found for requested labs within last 8760 hours.  Recent Lipid Panel No results found for: CHOL, TRIG, HDL, CHOLHDL, VLDL, LDLCALC, LDLDIRECT  Physical Exam:    VS:  BP 130/70   Pulse 77   Ht 5\' 3"  (1.6 m)   Wt 174 lb 1.9 oz (79 kg)   SpO2 98%   BMI 30.84 kg/m     Wt Readings from Last 3 Encounters:  08/22/17 174 lb 1.9 oz (79 kg)  04/12/13 156 lb (70.8 kg)     GEN: Patient is in no acute distress HEENT: Normal NECK: No JVD; No carotid bruits LYMPHATICS: No lymphadenopathy CARDIAC: S1 S2 regular, 2/6 systolic  murmur at the apex. RESPIRATORY:  Clear to auscultation without rales, wheezing or rhonchi  ABDOMEN: Soft, non-tender, non-distended MUSCULOSKELETAL:  No edema; No deformity  SKIN: Warm and dry NEUROLOGIC:  Alert and oriented x 3 PSYCHIATRIC:  Normal affect    Signed, Jenean Lindau, MD  08/22/2017 9:34 AM    [place]

## 2017-10-13 DIAGNOSIS — M791 Myalgia, unspecified site: Secondary | ICD-10-CM | POA: Diagnosis not present

## 2017-10-13 DIAGNOSIS — M7989 Other specified soft tissue disorders: Secondary | ICD-10-CM | POA: Diagnosis not present

## 2017-10-13 DIAGNOSIS — M2550 Pain in unspecified joint: Secondary | ICD-10-CM | POA: Diagnosis not present

## 2017-10-13 DIAGNOSIS — Z79899 Other long term (current) drug therapy: Secondary | ICD-10-CM | POA: Diagnosis not present

## 2017-10-13 DIAGNOSIS — E785 Hyperlipidemia, unspecified: Secondary | ICD-10-CM | POA: Diagnosis not present

## 2017-10-13 DIAGNOSIS — M79604 Pain in right leg: Secondary | ICD-10-CM | POA: Diagnosis not present

## 2017-10-13 DIAGNOSIS — M810 Age-related osteoporosis without current pathological fracture: Secondary | ICD-10-CM | POA: Diagnosis not present

## 2017-10-13 DIAGNOSIS — R600 Localized edema: Secondary | ICD-10-CM | POA: Diagnosis not present

## 2017-12-08 DIAGNOSIS — L814 Other melanin hyperpigmentation: Secondary | ICD-10-CM | POA: Diagnosis not present

## 2017-12-08 DIAGNOSIS — L821 Other seborrheic keratosis: Secondary | ICD-10-CM | POA: Diagnosis not present

## 2017-12-08 DIAGNOSIS — L578 Other skin changes due to chronic exposure to nonionizing radiation: Secondary | ICD-10-CM | POA: Diagnosis not present

## 2017-12-08 DIAGNOSIS — L570 Actinic keratosis: Secondary | ICD-10-CM | POA: Diagnosis not present

## 2017-12-08 DIAGNOSIS — D485 Neoplasm of uncertain behavior of skin: Secondary | ICD-10-CM | POA: Diagnosis not present

## 2017-12-15 DIAGNOSIS — Z923 Personal history of irradiation: Secondary | ICD-10-CM | POA: Diagnosis not present

## 2017-12-15 DIAGNOSIS — M8589 Other specified disorders of bone density and structure, multiple sites: Secondary | ICD-10-CM | POA: Diagnosis not present

## 2017-12-15 DIAGNOSIS — Z853 Personal history of malignant neoplasm of breast: Secondary | ICD-10-CM | POA: Diagnosis not present

## 2017-12-15 DIAGNOSIS — Z9221 Personal history of antineoplastic chemotherapy: Secondary | ICD-10-CM | POA: Diagnosis not present

## 2018-01-12 DIAGNOSIS — R635 Abnormal weight gain: Secondary | ICD-10-CM | POA: Diagnosis not present

## 2018-01-12 DIAGNOSIS — M810 Age-related osteoporosis without current pathological fracture: Secondary | ICD-10-CM | POA: Diagnosis not present

## 2018-01-12 DIAGNOSIS — H109 Unspecified conjunctivitis: Secondary | ICD-10-CM | POA: Diagnosis not present

## 2018-01-12 DIAGNOSIS — Z853 Personal history of malignant neoplasm of breast: Secondary | ICD-10-CM | POA: Diagnosis not present

## 2018-01-12 DIAGNOSIS — E785 Hyperlipidemia, unspecified: Secondary | ICD-10-CM | POA: Diagnosis not present

## 2018-01-12 DIAGNOSIS — I480 Paroxysmal atrial fibrillation: Secondary | ICD-10-CM | POA: Diagnosis not present

## 2018-01-12 DIAGNOSIS — E039 Hypothyroidism, unspecified: Secondary | ICD-10-CM | POA: Diagnosis not present

## 2018-01-12 DIAGNOSIS — Z79899 Other long term (current) drug therapy: Secondary | ICD-10-CM | POA: Diagnosis not present

## 2018-02-10 DIAGNOSIS — H25813 Combined forms of age-related cataract, bilateral: Secondary | ICD-10-CM | POA: Diagnosis not present

## 2018-02-10 DIAGNOSIS — H52221 Regular astigmatism, right eye: Secondary | ICD-10-CM | POA: Diagnosis not present

## 2018-02-10 DIAGNOSIS — H524 Presbyopia: Secondary | ICD-10-CM | POA: Diagnosis not present

## 2018-02-10 DIAGNOSIS — H35363 Drusen (degenerative) of macula, bilateral: Secondary | ICD-10-CM | POA: Diagnosis not present

## 2018-02-10 DIAGNOSIS — H5203 Hypermetropia, bilateral: Secondary | ICD-10-CM | POA: Diagnosis not present

## 2018-02-10 DIAGNOSIS — H47332 Pseudopapilledema of optic disc, left eye: Secondary | ICD-10-CM | POA: Diagnosis not present

## 2018-02-25 DIAGNOSIS — E039 Hypothyroidism, unspecified: Secondary | ICD-10-CM | POA: Diagnosis not present

## 2018-02-25 DIAGNOSIS — E785 Hyperlipidemia, unspecified: Secondary | ICD-10-CM | POA: Diagnosis not present

## 2018-02-25 DIAGNOSIS — Z79899 Other long term (current) drug therapy: Secondary | ICD-10-CM | POA: Diagnosis not present

## 2018-03-06 ENCOUNTER — Encounter: Payer: Self-pay | Admitting: Cardiology

## 2018-03-06 ENCOUNTER — Ambulatory Visit (INDEPENDENT_AMBULATORY_CARE_PROVIDER_SITE_OTHER): Payer: Medicare Other | Admitting: Cardiology

## 2018-03-06 VITALS — BP 126/82 | HR 65 | Ht 63.0 in | Wt 180.0 lb

## 2018-03-06 DIAGNOSIS — I480 Paroxysmal atrial fibrillation: Secondary | ICD-10-CM | POA: Diagnosis not present

## 2018-03-06 NOTE — Progress Notes (Signed)
Cardiology Office Note:    Date:  03/06/2018   ID:  Carrie Wilkerson, DOB Dec 14, 1940, MRN [id number]  PCP:  Ernestene Kiel, MD  Cardiologist:  Jenean Lindau, MD   Referring MD: Ernestene Kiel, MD    ASSESSMENT:    1. Paroxysmal atrial fibrillation (HCC)    PLAN:    In order of problems listed above:  1. Primary prevention stressed with patient.  Importance of compliance with diet and medication stressed and she vocalized understanding.  Her blood pressure is stable.  I discussed with her about anticoagulation again but she is not keen on it.  She has been adamantly against anticoagulation.  Benefits and potential risks explained.  She wants to continue full-strength aspirin and I respect her wishes. 2. Her blood pressure is stable.Patient will be seen in follow-up appointment in 6 months or earlier if the patient has any concerns    Medication Adjustments/Labs and Tests Ordered: Current medicines are reviewed at length with the patient today.  Concerns regarding medicines are outlined above.  No orders of the defined types were placed in this encounter.  No orders of the defined types were placed in this encounter.    Chief Complaint  Patient presents with  . Follow-up  . Atrial Fibrillation     History of Present Illness:    Carrie Wilkerson is a 77 y.o. female.  She has paroxysmal atrial fibrillation.  She denies any problems at this time and takes care of activities of daily living.  No chest pain orthopnea or PND.  She leads a sedentary lifestyle because of orthopedic issues.  She has not been very compliant with her diet and has gained weight.  She has not experienced any palpitations since her last visit.  Her husband is very supportive and accompanies her for this visit.  At the time of my evaluation, the patient is alert awake oriented and in no distress.  Past Medical History:  Diagnosis Date  . Arthritis   . Cancer [facility])    Hx: of breast cancer in 2007    . Numbness in right leg    Hx: of  . Pneumonia    Hx: of 'a long time ago"    Past Surgical History:  Procedure Laterality Date  . APPENDECTOMY    . BACK SURGERY    . BREAST SURGERY     Hx: of lumpectomy left breast 2007  . COLONOSCOPY     Hx: of  . DILATION AND CURETTAGE OF UTERUS    . TONSILLECTOMY    . 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 TUBAL LIGATION      Current Medications: Current Meds  Medication Sig  . aspirin 325 MG EC tablet Take 1 tablet (325 mg total) daily by mouth.  Marland Kitchen atorvastatin (LIPITOR) 10 MG tablet Take 1 tablet by mouth every other day.  Marland Kitchen CALCIUM PO Take 1 tablet by mouth daily.  . cholecalciferol (VITAMIN D) 1000 UNITS tablet Take 1,000 Units by mouth daily.  Marland Kitchen denosumab (PROLIA) 60 MG/ML SOLN injection Inject 60 mg every 6 (six) months into the skin. Administer in upper arm, thigh, or abdomen  . Flaxseed, Linseed, OIL Take 1 capsule by mouth daily.  Marland Kitchen glucosamine-chondroitin 500-400 MG tablet Take 1 tablet by mouth 3 (  three) times daily.  Marland Kitchen levothyroxine (SYNTHROID, LEVOTHROID) 75 MCG tablet Take 25 mcg by mouth daily.   . metoprolol tartrate (LOPRESSOR) 25 MG tablet Take 25 mg 2 (two) times daily by mouth.  . Multiple Vitamin (MULTIVITAMIN WITH MINERALS) TABS Take 1 tablet by mouth daily.  Marland Kitchen PRESCRIPTION MEDICATION Take 1 tablet by mouth daily. Prescription estrogen blocker  . [DISCONTINUED] rosuvastatin (CRESTOR) 5 MG tablet Take 5 mg daily by mouth.     Allergies:   Codeine and Sulfa antibiotics   Social History   Socioeconomic History  . Marital status: Married    Spouse name: Not on file  . Number of children: Not on file  . Years of education: Not on file  . Highest education level: Not on file  Occupational History   . Not on file  Social Needs  . Financial resource strain: Not on file  . Food insecurity:    Worry: Not on file    Inability: Not on file  . Transportation needs:    Medical: Not on file    Non-medical: Not on file  Tobacco Use  . Smoking status: Never Smoker  . Smokeless tobacco: Never Used  Substance and Sexual Activity  . Alcohol use: No  . Drug use: No  . Sexual activity: Not on file  Lifestyle  . Physical activity:    Days per week: Not on file    Minutes per session: Not on file  . Stress: Not on file  Relationships  . Social connections:    Talks on phone: Not on file    Gets together: Not on file    Attends religious service: Not on file    Active member of club or organization: Not on file    Attends meetings of clubs or organizations: Not on file    Relationship status: Not on file  Other Topics Concern  . Not on file  Social History Narrative  . Not on file     Family History: The patient's family history includes Cancer in her other; Hypertension in her brother and sister.  ROS:   Please see the history of present illness.    All other systems reviewed and are negative.  EKGs/Labs/Other Studies Reviewed:    The following studies were reviewed today: I discussed findings with the patient at length.   Recent Labs: No results found for requested labs within last 8760 hours.  Recent Lipid Panel No results found for: CHOL, TRIG, HDL, CHOLHDL, VLDL, LDLCALC, LDLDIRECT  Physical Exam:    VS:  BP 126/82 (BP Location: Right Arm, Patient Position: Sitting, Cuff Size: Normal)   Pulse 65   Ht 5\' 3"  (1.6 m)   Wt 180 lb (81.6 kg)   SpO2 94%   BMI 31.89 kg/m     Wt Readings from Last 3 Encounters:  03/06/18 180 lb (81.6 kg)  08/22/17 174 lb 1.9 oz (79 kg)  04/12/13 156 lb (70.8 kg)     GEN: Patient is in no acute distress HEENT: Normal NECK: No JVD; No carotid bruits LYMPHATICS: No lymphadenopathy CARDIAC: Hear sounds regular, 2/6 systolic  murmur at the apex. RESPIRATORY:  Clear to auscultation without rales, wheezing or rhonchi  ABDOMEN: Soft, non-tender, non-distended MUSCULOSKELETAL:  No edema; No deformity  SKIN: Warm and dry NEUROLOGIC:  Alert and oriented x 3 PSYCHIATRIC:  Normal affect   Signed, Jenean Lindau, MD  03/06/2018 10:07 AM    [place]

## 2018-03-06 NOTE — Patient Instructions (Signed)

## 2018-04-13 DIAGNOSIS — L300 Nummular dermatitis: Secondary | ICD-10-CM | POA: Diagnosis not present

## 2018-04-13 DIAGNOSIS — D485 Neoplasm of uncertain behavior of skin: Secondary | ICD-10-CM | POA: Diagnosis not present

## 2018-04-14 DIAGNOSIS — I480 Paroxysmal atrial fibrillation: Secondary | ICD-10-CM | POA: Diagnosis not present

## 2018-04-14 DIAGNOSIS — E785 Hyperlipidemia, unspecified: Secondary | ICD-10-CM | POA: Diagnosis not present

## 2018-04-14 DIAGNOSIS — E039 Hypothyroidism, unspecified: Secondary | ICD-10-CM | POA: Diagnosis not present

## 2018-05-07 ENCOUNTER — Other Ambulatory Visit: Payer: Self-pay

## 2018-05-15 DIAGNOSIS — M7582 Other shoulder lesions, left shoulder: Secondary | ICD-10-CM | POA: Diagnosis not present

## 2018-06-23 DIAGNOSIS — M85832 Other specified disorders of bone density and structure, left forearm: Secondary | ICD-10-CM | POA: Diagnosis not present

## 2018-06-23 DIAGNOSIS — M8589 Other specified disorders of bone density and structure, multiple sites: Secondary | ICD-10-CM | POA: Diagnosis not present

## 2018-06-23 DIAGNOSIS — M85852 Other specified disorders of bone density and structure, left thigh: Secondary | ICD-10-CM | POA: Diagnosis not present

## 2018-06-25 DIAGNOSIS — M8580 Other specified disorders of bone density and structure, unspecified site: Secondary | ICD-10-CM | POA: Diagnosis not present

## 2018-06-25 DIAGNOSIS — Z853 Personal history of malignant neoplasm of breast: Secondary | ICD-10-CM | POA: Diagnosis not present

## 2018-07-20 DIAGNOSIS — E785 Hyperlipidemia, unspecified: Secondary | ICD-10-CM | POA: Diagnosis not present

## 2018-07-20 DIAGNOSIS — Z1331 Encounter for screening for depression: Secondary | ICD-10-CM | POA: Diagnosis not present

## 2018-07-20 DIAGNOSIS — E669 Obesity, unspecified: Secondary | ICD-10-CM | POA: Diagnosis not present

## 2018-07-20 DIAGNOSIS — I480 Paroxysmal atrial fibrillation: Secondary | ICD-10-CM | POA: Diagnosis not present

## 2018-07-20 DIAGNOSIS — Z6832 Body mass index (BMI) 32.0-32.9, adult: Secondary | ICD-10-CM | POA: Diagnosis not present

## 2018-07-20 DIAGNOSIS — Z1339 Encounter for screening examination for other mental health and behavioral disorders: Secondary | ICD-10-CM | POA: Diagnosis not present

## 2018-07-20 DIAGNOSIS — Z79899 Other long term (current) drug therapy: Secondary | ICD-10-CM | POA: Diagnosis not present

## 2018-07-20 DIAGNOSIS — Z7189 Other specified counseling: Secondary | ICD-10-CM | POA: Diagnosis not present

## 2018-07-20 DIAGNOSIS — Z0000 Encounter for general adult medical examination without abnormal findings: Secondary | ICD-10-CM | POA: Diagnosis not present

## 2018-07-20 DIAGNOSIS — E039 Hypothyroidism, unspecified: Secondary | ICD-10-CM | POA: Diagnosis not present

## 2018-07-23 DIAGNOSIS — Z23 Encounter for immunization: Secondary | ICD-10-CM | POA: Diagnosis not present

## 2018-08-24 ENCOUNTER — Other Ambulatory Visit: Payer: Self-pay

## 2018-08-24 DIAGNOSIS — Z1231 Encounter for screening mammogram for malignant neoplasm of breast: Secondary | ICD-10-CM | POA: Diagnosis not present

## 2018-11-03 ENCOUNTER — Ambulatory Visit (INDEPENDENT_AMBULATORY_CARE_PROVIDER_SITE_OTHER): Payer: Medicare Other | Admitting: Cardiology

## 2018-11-03 ENCOUNTER — Encounter: Payer: Self-pay | Admitting: Cardiology

## 2018-11-03 VITALS — BP 122/74 | HR 79 | Ht 63.0 in | Wt 184.8 lb

## 2018-11-03 DIAGNOSIS — I480 Paroxysmal atrial fibrillation: Secondary | ICD-10-CM

## 2018-11-03 DIAGNOSIS — E782 Mixed hyperlipidemia: Secondary | ICD-10-CM | POA: Diagnosis not present

## 2018-11-03 HISTORY — DX: Mixed hyperlipidemia: E78.2

## 2018-11-03 MED ORDER — APIXABAN 5 MG PO TABS: 5.0000 mg | ORAL_TABLET | Freq: Two times a day (BID) | ORAL | 1 refills | Status: DC

## 2018-11-03 NOTE — Patient Instructions (Signed)
Medication Instructions:  Your physician has recommended you make the following change in your medication:   Stop: Aspirin  Start: Eliquis 5 mg twice daily   If you need a refill on your cardiac medications before your next appointment, please call your pharmacy.   Lab work: Your physician recommends that you return for lab work today: Bmp, cbc, tsh, lft, lipids, and ifob   If you have labs (blood work) drawn today and your tests are completely normal, you will receive your results only by: Marland Kitchen MyChart Message (if you have MyChart) OR . A paper copy in the mail If you have any lab test that is abnormal or we need to change your treatment, we will call you to review the results.  Testing/Procedures: None.   Follow-Up: At [facility], you and your health needs are our priority.  As part of our continuing mission to provide you with exceptional heart care, we have created designated Provider Care Teams.  These Care Teams include your primary Cardiologist (physician) and Advanced Practice Providers (APPs -  Physician Assistants and Nurse Practitioners) who all work together to provide you with the care you need, when you need it. You will need a follow up appointment in 6 months.  Please call our office 2 months in advance to schedule this appointment.  You may see No primary care provider on file. or another member of our [employer] Provider Team in [place]: Jenne Campus, MD . Shirlee More, MD  Any Other Special Instructions Will Be Listed Below (If Applicable).  Apixaban oral tablets What is this medicine? APIXABAN (a PIX a ban) is an anticoagulant (blood thinner). It is used to lower the chance of stroke in people with a medical condition called atrial fibrillation. It is also used to treat or prevent blood clots in the lungs or in the veins. This medicine may be used for other purposes; ask your health care provider or pharmacist if you have questions. COMMON BRAND NAME(S):  Eliquis What should I tell my health care provider before I take this medicine? They need to know if you have any of these conditions: -bleeding disorders -bleeding in the brain -blood in your stools (black or tarry stools) or if you have blood in your vomit -history of stomach bleeding -kidney disease -liver disease -mechanical heart valve -an unusual or allergic reaction to apixaban, other medicines, foods, dyes, or preservatives -pregnant or trying to get pregnant -breast-feeding How should I use this medicine? Take this medicine by mouth with a glass of water. Follow the directions on the prescription label. You can take it with or without food. If it upsets your stomach, take it with food. Take your medicine at regular intervals. Do not take it more often than directed. Do not stop taking except on your doctor's advice. Stopping this medicine may increase your risk of a blood clot. Be sure to refill your prescription before you run out of medicine. Talk to your pediatrician regarding the use of this medicine in children. Special care may be needed. Overdosage: If you think you have taken too much of this medicine contact a poison control center or emergency room at once. NOTE: This medicine is only for you. Do not share this medicine with others. What if I miss a dose? If you miss a dose, take it as soon as you can. If it is almost time for your next dose, take only that dose. Do not take double or extra doses. What may interact with  this medicine? This medicine may interact with the following: -aspirin and aspirin-like medicines -certain medicines for fungal infections like ketoconazole and itraconazole -certain medicines for seizures like carbamazepine and phenytoin -certain medicines that treat or prevent blood clots like warfarin, enoxaparin, and dalteparin -clarithromycin -NSAIDs, medicines for pain and inflammation, like ibuprofen or naproxen -rifampin -ritonavir -St. John's  wort This list may not describe all possible interactions. Give your health care provider a list of all the medicines, herbs, non-prescription drugs, or dietary supplements you use. Also tell them if you smoke, drink alcohol, or use illegal drugs. Some items may interact with your medicine. What should I watch for while using this medicine? Visit your healthcare professional for regular checks on your progress. You may need blood work done while you are taking this medicine. Your condition will be monitored carefully while you are receiving this medicine. It is important not to miss any appointments. Avoid sports and activities that might cause injury while you are using this medicine. Severe falls or injuries can cause unseen bleeding. Be careful when using sharp tools or knives. Consider using an Copy. Take special care brushing or flossing your teeth. Report any injuries, bruising, or red spots on the skin to your healthcare professional. If you are going to need surgery or other procedure, tell your healthcare professional that you are taking this medicine. Wear a medical ID bracelet or chain. Carry a card that describes your disease and details of your medicine and dosage times. What side effects may I notice from receiving this medicine? Side effects that you should report to your doctor or health care professional as soon as possible: -allergic reactions like skin rash, itching or hives, swelling of the face, lips, or tongue -signs and symptoms of bleeding such as bloody or black, tarry stools; red or dark-brown urine; spitting up blood or brown material that looks like coffee grounds; red spots on the skin; unusual bruising or bleeding from the eye, gums, or nose -signs and symptoms of a blood clot such as chest pain; shortness of breath; pain, swelling, or warmth in the leg -signs and symptoms of a stroke such as changes in vision; confusion; trouble speaking or understanding; severe  headaches; sudden numbness or weakness of the face, arm or leg; trouble walking; dizziness; loss of coordination This list may not describe all possible side effects. Call your doctor for medical advice about side effects. You may report side effects to FDA at 1-800-FDA-1088. Where should I keep my medicine? Keep out of the reach of children. Store at room temperature between 20 and 25 degrees C (68 and 77 degrees F). Throw away any unused medicine after the expiration date. NOTE: This sheet is a summary. It may not cover all possible information. If you have questions about this medicine, talk to your doctor, pharmacist, or health care provider.  2019 Elsevier/Gold Standard (2017-09-18 11:20:07)

## 2018-11-03 NOTE — Addendum Note (Signed)
Addended by: Ashok Norris on: 11/03/2018 02:14 PM   Modules accepted: Orders

## 2018-11-03 NOTE — Progress Notes (Signed)
Cardiology Office Note:    Date:  11/03/2018   ID:  Carrie Wilkerson, DOB Mar 03, 1941, MRN [id number]  PCP:  Ernestene Kiel, MD  Cardiologist:  Jenean Lindau, MD   Referring MD: Ernestene Kiel, MD    ASSESSMENT:    1. Paroxysmal atrial fibrillation (HCC)    PLAN:    In order of problems listed above:  1. I discussed with the patient atrial fibrillation, disease process. Management and therapy including rate and rhythm control, anticoagulation benefits and potential risks were discussed extensively with the patient. Patient had multiple questions which were answered to patient's satisfaction. 2. Patient is fasting and will have all blood work today.  She will also get an the necessary testing for stool for occult blood.  I initiated her on Eliquis 5 mg twice daily and samples were given.  Benefits and potential risks were explained extensively.  Diet was discussed for dyslipidemia and obesity and she vocalized understanding. 3. Patient will be seen in follow-up appointment in 6 months or earlier if the patient has any concerns    Medication Adjustments/Labs and Tests Ordered: Current medicines are reviewed at length with the patient today.  Concerns regarding medicines are outlined above.  No orders of the defined types were placed in this encounter.  No orders of the defined types were placed in this encounter.    Chief Complaint  Patient presents with  . Follow-up     History of Present Illness:    Carrie Wilkerson is a 78 y.o. female.  She has paroxysmal atrial fibrillation and dyslipidemia.  She takes care of activities of daily living.  No chest pain orthopnea or PND.  No palpitations.  She is overweight and leads a very sedentary lifestyle.  I discussed with her again about anticoagulation today.  Past Medical History:  Diagnosis Date  . Arthritis   . Cancer [facility])    Hx: of breast cancer in 2007  . Numbness in right leg    Hx: of  . Pneumonia    Hx: of 'a  long time ago"    Past Surgical History:  Procedure Laterality Date  . APPENDECTOMY    . BACK SURGERY    . BREAST SURGERY     Hx: of lumpectomy left breast 2007  . COLONOSCOPY     Hx: of  . DILATION AND CURETTAGE OF UTERUS    . TONSILLECTOMY    . 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 TOTAL HIP ARTHROPLASTY Right 04/13/2013   Procedure: RIGHT TOTAL HIP ARTHROPLASTY ANTERIOR APPROACH;  Surgeon: Mcarthur Rossetti, MD;  Location: [place];  Service: Orthopedics;  Laterality: Right;  . TUBAL LIGATION      Current Medications: Current Meds  Medication Sig  . aspirin 325 MG EC tablet Take 1 tablet (325 mg total) daily by mouth.  Marland Kitchen atorvastatin (LIPITOR) 10 MG tablet Take 1 tablet by mouth every other day.  Marland Kitchen CALCIUM PO Take 1 tablet by mouth daily.  . cholecalciferol (VITAMIN D) 1000 UNITS tablet Take 1,000 Units by mouth daily.  Marland Kitchen denosumab (PROLIA) 60 MG/ML SOLN injection Inject 60 mg every 6 (six) months into the skin. Administer in upper arm, thigh, or abdomen  . Flaxseed, Linseed, OIL Take 1 capsule by mouth daily.  Marland Kitchen glucosamine-chondroitin 500-400 MG tablet Take 1 tablet by mouth 2 (two) times daily.   Marland Kitchen levothyroxine (SYNTHROID, LEVOTHROID) 75 MCG tablet Take 75 mcg by mouth daily.   . metoprolol tartrate (  LOPRESSOR) 25 MG tablet Take 25 mg 2 (two) times daily by mouth.  . Multiple Vitamin (MULTIVITAMIN WITH MINERALS) TABS Take 1 tablet by mouth daily.     Allergies:   Codeine and Sulfa antibiotics   Social History   Socioeconomic History  . Marital status: Married    Spouse name: Not on file  . Number of children: Not on file  . Years of education: Not on file  . Highest education level: Not on file  Occupational History  . Not on file  Social Needs  . Financial resource strain: Not on file  . Food insecurity:    Worry: Not on file    Inability: Not on file  . Transportation needs:    Medical: Not on file    Non-medical: Not on file  Tobacco  Use  . Smoking status: Never Smoker  . Smokeless tobacco: Never Used  Substance and Sexual Activity  . Alcohol use: No  . Drug use: No  . Sexual activity: Not on file  Lifestyle  . Physical activity:    Days per week: Not on file    Minutes per session: Not on file  . Stress: Not on file  Relationships  . Social connections:    Talks on phone: Not on file    Gets together: Not on file    Attends religious service: Not on file    Active member of club or organization: Not on file    Attends meetings of clubs or organizations: Not on file    Relationship status: Not on file  Other Topics Concern  . Not on file  Social History Narrative  . Not on file     Family History: The patient's family history includes Cancer in an other family member; Hypertension in her brother and sister.  ROS:   Please see the history of present illness.    All other systems reviewed and are negative.  EKGs/Labs/Other Studies Reviewed:    The following studies were reviewed today: EKG reveals sinus rhythm and nonspecific ST-T changes.   Recent Labs: No results found for requested labs within last 8760 hours.  Recent Lipid Panel No results found for: CHOL, TRIG, HDL, CHOLHDL, VLDL, LDLCALC, LDLDIRECT  Physical Exam:    VS:  BP 122/74   Pulse 79   Ht 5\' 3"  (1.6 m)   Wt 184 lb 12.8 oz (83.8 kg)   SpO2 94%   BMI 32.74 kg/m     Wt Readings from Last 3 Encounters:  11/03/18 184 lb 12.8 oz (83.8 kg)  03/06/18 180 lb (81.6 kg)  08/22/17 174 lb 1.9 oz (79 kg)     GEN: Patient is in no acute distress HEENT: Normal NECK: No JVD; No carotid bruits LYMPHATICS: No lymphadenopathy CARDIAC: Hear sounds regular, 2/6 systolic murmur at the apex. RESPIRATORY:  Clear to auscultation without rales, wheezing or rhonchi  ABDOMEN: Soft, non-tender, non-distended MUSCULOSKELETAL:  No edema; No deformity  SKIN: Warm and dry NEUROLOGIC:  Alert and oriented x 3 PSYCHIATRIC:  Normal affect    Signed, Jenean Lindau, MD  11/03/2018 8:44 AM    [place]

## 2018-11-03 NOTE — Addendum Note (Signed)
Addended by: Linna Hoff R on: 11/03/2018 09:00 AM   Modules accepted: Orders

## 2018-11-04 DIAGNOSIS — I480 Paroxysmal atrial fibrillation: Secondary | ICD-10-CM | POA: Diagnosis not present

## 2018-11-04 LAB — CBC
HEMOGLOBIN: 14.5 g/dL (ref 11.1–15.9)
Hematocrit: 42.6 % (ref 34.0–46.6)
MCH: 31.8 pg (ref 26.6–33.0)
MCHC: 34 g/dL (ref 31.5–35.7)
MCV: 93 fL (ref 79–97)
Platelets: 257 10*3/uL (ref 150–450)
RBC: 4.56 x10E6/uL (ref 3.77–5.28)
RDW: 12.3 % (ref 11.7–15.4)
WBC: 5.2 10*3/uL (ref 3.4–10.8)

## 2018-11-04 LAB — LIPID PANEL
CHOL/HDL RATIO: 4 ratio (ref 0.0–4.4)
Cholesterol, Total: 172 mg/dL (ref 100–199)
HDL: 43 mg/dL (ref >39)
LDL Calculated: 86 mg/dL (ref 0–99)
Triglycerides: 217 mg/dL — ABNORMAL HIGH (ref 0–149)
VLDL Cholesterol Cal: 43 mg/dL — ABNORMAL HIGH (ref 5–40)

## 2018-11-04 LAB — HEPATIC FUNCTION PANEL
ALBUMIN: 4 g/dL (ref 3.7–4.7)
ALT: 24 IU/L (ref 0–32)
AST: 21 IU/L (ref 0–40)
Alkaline Phosphatase: 60 IU/L (ref 39–117)
BILIRUBIN TOTAL: 0.3 mg/dL (ref 0.0–1.2)
Bilirubin, Direct: 0.09 mg/dL (ref 0.00–0.40)
Total Protein: 6.7 g/dL (ref 6.0–8.5)

## 2018-11-04 LAB — BASIC METABOLIC PANEL
BUN/Creatinine Ratio: 17 (ref 12–28)
BUN: 14 mg/dL (ref 8–27)
CHLORIDE: 103 mmol/L (ref 96–106)
CO2: 24 mmol/L (ref 20–29)
Calcium: 9.3 mg/dL (ref 8.7–10.3)
Creatinine, Ser: 0.84 mg/dL (ref 0.57–1.00)
GFR calc non Af Amer: 66 mL/min/{1.73_m2} (ref >59)
GFR, EST AFRICAN AMERICAN: 76 mL/min/{1.73_m2} (ref >59)
Glucose: 107 mg/dL — ABNORMAL HIGH (ref 65–99)
POTASSIUM: 4.4 mmol/L (ref 3.5–5.2)
SODIUM: 143 mmol/L (ref 134–144)

## 2018-11-04 LAB — TSH: TSH: 1.89 u[IU]/mL (ref 0.450–4.500)

## 2018-11-08 LAB — FECAL OCCULT BLOOD, IMMUNOCHEMICAL: FECAL OCCULT BLD: NEGATIVE

## 2018-12-24 DIAGNOSIS — Z853 Personal history of malignant neoplasm of breast: Secondary | ICD-10-CM | POA: Diagnosis not present

## 2018-12-24 DIAGNOSIS — M8589 Other specified disorders of bone density and structure, multiple sites: Secondary | ICD-10-CM | POA: Diagnosis not present

## 2019-01-11 DIAGNOSIS — Z6833 Body mass index (BMI) 33.0-33.9, adult: Secondary | ICD-10-CM | POA: Diagnosis not present

## 2019-01-11 DIAGNOSIS — E785 Hyperlipidemia, unspecified: Secondary | ICD-10-CM | POA: Diagnosis not present

## 2019-01-11 DIAGNOSIS — E039 Hypothyroidism, unspecified: Secondary | ICD-10-CM | POA: Diagnosis not present

## 2019-01-11 DIAGNOSIS — M810 Age-related osteoporosis without current pathological fracture: Secondary | ICD-10-CM | POA: Diagnosis not present

## 2019-01-11 DIAGNOSIS — I480 Paroxysmal atrial fibrillation: Secondary | ICD-10-CM | POA: Diagnosis not present

## 2019-01-14 ENCOUNTER — Other Ambulatory Visit: Payer: Self-pay | Admitting: Cardiology

## 2019-04-13 ENCOUNTER — Other Ambulatory Visit: Payer: Self-pay | Admitting: Cardiology

## 2019-04-14 NOTE — Telephone Encounter (Signed)
Eliquis refill sent to [facility] [phone]

## 2019-05-12 ENCOUNTER — Other Ambulatory Visit: Payer: Self-pay | Admitting: Cardiology

## 2019-06-08 ENCOUNTER — Other Ambulatory Visit: Payer: Self-pay | Admitting: Cardiology

## 2019-06-22 DIAGNOSIS — Z853 Personal history of malignant neoplasm of breast: Secondary | ICD-10-CM | POA: Diagnosis not present

## 2019-06-22 DIAGNOSIS — M8589 Other specified disorders of bone density and structure, multiple sites: Secondary | ICD-10-CM | POA: Diagnosis not present

## 2019-06-29 DIAGNOSIS — H3530 Unspecified macular degeneration: Secondary | ICD-10-CM | POA: Diagnosis not present

## 2019-06-29 DIAGNOSIS — H47332 Pseudopapilledema of optic disc, left eye: Secondary | ICD-10-CM | POA: Diagnosis not present

## 2019-06-29 DIAGNOSIS — H4710 Unspecified papilledema: Secondary | ICD-10-CM | POA: Diagnosis not present

## 2019-06-29 DIAGNOSIS — H52223 Regular astigmatism, bilateral: Secondary | ICD-10-CM | POA: Diagnosis not present

## 2019-06-29 DIAGNOSIS — H25813 Combined forms of age-related cataract, bilateral: Secondary | ICD-10-CM | POA: Diagnosis not present

## 2019-06-29 DIAGNOSIS — H353111 Nonexudative age-related macular degeneration, right eye, early dry stage: Secondary | ICD-10-CM | POA: Diagnosis not present

## 2019-06-29 DIAGNOSIS — H35363 Drusen (degenerative) of macula, bilateral: Secondary | ICD-10-CM | POA: Diagnosis not present

## 2019-06-29 DIAGNOSIS — H5203 Hypermetropia, bilateral: Secondary | ICD-10-CM | POA: Diagnosis not present

## 2019-06-29 DIAGNOSIS — H524 Presbyopia: Secondary | ICD-10-CM | POA: Diagnosis not present

## 2019-06-30 ENCOUNTER — Other Ambulatory Visit: Payer: Self-pay | Admitting: Cardiology

## 2019-06-30 NOTE — Telephone Encounter (Signed)
Rx refill sent to pharmacy. 

## 2019-07-11 ENCOUNTER — Other Ambulatory Visit: Payer: Self-pay | Admitting: Cardiology

## 2019-07-22 DIAGNOSIS — M810 Age-related osteoporosis without current pathological fracture: Secondary | ICD-10-CM | POA: Diagnosis not present

## 2019-07-22 DIAGNOSIS — Z79899 Other long term (current) drug therapy: Secondary | ICD-10-CM | POA: Diagnosis not present

## 2019-07-22 DIAGNOSIS — E785 Hyperlipidemia, unspecified: Secondary | ICD-10-CM | POA: Diagnosis not present

## 2019-07-22 DIAGNOSIS — E039 Hypothyroidism, unspecified: Secondary | ICD-10-CM | POA: Diagnosis not present

## 2019-07-26 DIAGNOSIS — Z23 Encounter for immunization: Secondary | ICD-10-CM | POA: Diagnosis not present

## 2019-07-28 DIAGNOSIS — Z7189 Other specified counseling: Secondary | ICD-10-CM | POA: Diagnosis not present

## 2019-07-28 DIAGNOSIS — Z0000 Encounter for general adult medical examination without abnormal findings: Secondary | ICD-10-CM | POA: Diagnosis not present

## 2019-07-28 DIAGNOSIS — Z23 Encounter for immunization: Secondary | ICD-10-CM | POA: Diagnosis not present

## 2019-07-28 DIAGNOSIS — Z6833 Body mass index (BMI) 33.0-33.9, adult: Secondary | ICD-10-CM | POA: Diagnosis not present

## 2019-07-28 DIAGNOSIS — Z1331 Encounter for screening for depression: Secondary | ICD-10-CM | POA: Diagnosis not present

## 2019-07-28 DIAGNOSIS — Z1339 Encounter for screening examination for other mental health and behavioral disorders: Secondary | ICD-10-CM | POA: Diagnosis not present

## 2019-08-11 ENCOUNTER — Ambulatory Visit (INDEPENDENT_AMBULATORY_CARE_PROVIDER_SITE_OTHER): Payer: Medicare Other | Admitting: Cardiology

## 2019-08-11 ENCOUNTER — Other Ambulatory Visit: Payer: Self-pay

## 2019-08-11 ENCOUNTER — Encounter: Payer: Self-pay | Admitting: Cardiology

## 2019-08-11 VITALS — BP 112/62 | HR 70 | Ht 63.0 in | Wt 180.2 lb

## 2019-08-11 DIAGNOSIS — I480 Paroxysmal atrial fibrillation: Secondary | ICD-10-CM | POA: Diagnosis not present

## 2019-08-11 DIAGNOSIS — E782 Mixed hyperlipidemia: Secondary | ICD-10-CM

## 2019-08-11 MED ORDER — APIXABAN 5 MG PO TABS: 5.0000 mg | ORAL_TABLET | Freq: Two times a day (BID) | ORAL | 3 refills | Status: DC

## 2019-08-11 NOTE — Progress Notes (Signed)
Cardiology Office Note:    Date:  08/11/2019   ID:  Carrie Wilkerson, DOB 1941-04-13, MRN [id number]  PCP:  Ernestene Kiel, MD  Cardiologist:  Jenean Lindau, MD   Referring MD: Ernestene Kiel, MD    ASSESSMENT:    1. Paroxysmal atrial fibrillation (HCC)   2. Mixed dyslipidemia    PLAN:    In order of problems listed above:  1. Paroxysmal atrial fibrillation: I discussed with the patient atrial fibrillation, disease process. Management and therapy including rate and rhythm control, anticoagulation benefits and potential risks were discussed extensively with the patient. Patient had multiple questions which were answered to patient's satisfaction. 2. Mixed dyslipidemia: Diet was discussed.  Lipids followed by primary care physician. 3. Patient will be seen in follow-up appointment in 9 months or earlier if the patient has any concerns    Medication Adjustments/Labs and Tests Ordered: Current medicines are reviewed at length with the patient today.  Concerns regarding medicines are outlined above.  No orders of the defined types were placed in this encounter.  No orders of the defined types were placed in this encounter.    No chief complaint on file.    History of Present Illness:    Carrie Wilkerson is a 78 y.o. female.  Patient has past medical history of paroxysmal atrial fibrillation, mixed dyslipidemia.  She denies any problems at this time and takes care of activities of daily living.  No chest pain orthopnea or PND.  She is overweight and leads a sedentary lifestyle because of orthopedic issues.  At the time of my evaluation, the patient is alert awake oriented and in no distress.  Past Medical History:  Diagnosis Date  . Arthritis   . Cancer [facility])    Hx: of breast cancer in 2007  . Numbness in right leg    Hx: of  . Pneumonia    Hx: of 'a long time ago"    Past Surgical History:  Procedure Laterality Date  . APPENDECTOMY    . BACK SURGERY    .  BREAST SURGERY     Hx: of lumpectomy left breast 2007  . COLONOSCOPY     Hx: of  . DILATION AND CURETTAGE OF UTERUS    . TONSILLECTOMY    . 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 TOTAL HIP ARTHROPLASTY Right 04/13/2013   Procedure: RIGHT TOTAL HIP ARTHROPLASTY ANTERIOR APPROACH;  Surgeon: Mcarthur Rossetti, MD;  Location: [place];  Service: Orthopedics;  Laterality: Right;  . TUBAL LIGATION      Current Medications: Current Meds  Medication Sig  . amoxicillin (AMOXIL) 500 MG tablet TAKE 4 TABLETS BY MOUTH 1 HOUR BEFORE DENTAL APPOINTMENT  . atorvastatin (LIPITOR) 10 MG tablet Take 1 tablet by mouth every other day.  Marland Kitchen CALCIUM PO Take 1 tablet by mouth daily.  . cholecalciferol (VITAMIN D) 1000 UNITS tablet Take 1,000 Units by mouth daily.  Marland Kitchen denosumab (PROLIA) 60 MG/ML SOLN injection Inject 60 mg every 6 (six) months into the skin. Administer in upper arm, thigh, or abdomen  . ELIQUIS 5 MG TABS tablet TAKE 1 TABLET BY MOUTH TWICE A DAY  . Flaxseed, Linseed, OIL Take 1 capsule by mouth daily.  Marland Kitchen glucosamine-chondroitin 500-400 MG tablet Take 1 tablet by mouth 2 (two) times daily.   Marland Kitchen levothyroxine (SYNTHROID, LEVOTHROID) 75 MCG tablet Take 75 mcg by mouth daily.   . metoprolol tartrate (LOPRESSOR) 25 MG tablet Take 25 mg 2 (  two) times daily by mouth.  . Multiple Vitamin (MULTIVITAMIN WITH MINERALS) TABS Take 1 tablet by mouth daily.     Allergies:   Codeine and Sulfa antibiotics   Social History   Socioeconomic History  . Marital status: Married    Spouse name: Not on file  . Number of children: Not on file  . Years of education: Not on file  . Highest education level: Not on file  Occupational History  . Not on file  Social Needs  . Financial resource strain: Not on file  . Food insecurity    Worry: Not on file    Inability: Not on file  . Transportation needs    Medical: Not on file    Non-medical: Not on file  Tobacco Use  . Smoking status: Never  Smoker  . Smokeless tobacco: Never Used  Substance and Sexual Activity  . Alcohol use: No  . Drug use: No  . Sexual activity: Not on file  Lifestyle  . Physical activity    Days per week: Not on file    Minutes per session: Not on file  . Stress: Not on file  Relationships  . Social Herbalist on phone: Not on file    Gets together: Not on file    Attends religious service: Not on file    Active member of club or organization: Not on file    Attends meetings of clubs or organizations: Not on file    Relationship status: Not on file  Other Topics Concern  . Not on file  Social History Narrative  . Not on file     Family History: The patient's family history includes Cancer in an other family member; Hypertension in her brother and sister.  ROS:   Please see the history of present illness.    All other systems reviewed and are negative.  EKGs/Labs/Other Studies Reviewed:    The following studies were reviewed today: As mentioned above   Recent Labs: 11/03/2018: ALT 24; BUN 14; Creatinine, Ser 0.84; Hemoglobin 14.5; Platelets 257; Potassium 4.4; Sodium 143; TSH 1.890  Recent Lipid Panel    Component Value Date/Time   CHOL 172 11/03/2018 0907   TRIG 217 (H) 11/03/2018 0907   HDL 43 11/03/2018 0907   CHOLHDL 4.0 11/03/2018 0907   LDLCALC 86 11/03/2018 0907    Physical Exam:    VS:  BP 112/62 (BP Location: Right Arm, Patient Position: Sitting, Cuff Size: Large)   Pulse 70   Ht 5\' 3"  (1.6 m)   Wt 180 lb 3.2 oz (81.7 kg)   SpO2 99%   BMI 31.92 kg/m     Wt Readings from Last 3 Encounters:  08/11/19 180 lb 3.2 oz (81.7 kg)  11/03/18 184 lb 12.8 oz (83.8 kg)  03/06/18 180 lb (81.6 kg)     GEN: Patient is in no acute distress HEENT: Normal NECK: No JVD; No carotid bruits LYMPHATICS: No lymphadenopathy CARDIAC: Hear sounds regular, 2/6 systolic murmur at the apex. RESPIRATORY:  Clear to auscultation without rales, wheezing or rhonchi  ABDOMEN: Soft,  non-tender, non-distended MUSCULOSKELETAL:  No edema; No deformity  SKIN: Warm and dry NEUROLOGIC:  Alert and oriented x 3 PSYCHIATRIC:  Normal affect   Signed, Jenean Lindau, MD  08/11/2019 10:00 AM    [facility]

## 2019-08-11 NOTE — Addendum Note (Signed)
Addended by: Beckey Rutter on: 08/11/2019 10:05 AM   Modules accepted: Orders

## 2019-08-11 NOTE — Patient Instructions (Signed)
Medication Instructions:  Your physician recommends that you continue on your current medications as directed. Please refer to the Current Medication list given to you today.  *If you need a refill on your cardiac medications before your next appointment, please call your pharmacy*  Lab Work: NONE If you have labs (blood work) drawn today and your tests are completely normal, you will receive your results only by: Marland Kitchen MyChart Message (if you have MyChart) OR . A paper copy in the mail If you have any lab test that is abnormal or we need to change your treatment, we will call you to review the results.  Testing/Procedures: NONE  Follow-Up: At [facility], you and your health needs are our priority.  As part of our continuing mission to provide you with exceptional heart care, we have created designated Provider Care Teams.  These Care Teams include your primary Cardiologist (physician) and Advanced Practice Providers (APPs -  Physician Assistants and Nurse Practitioners) who all work together to provide you with the care you need, when you need it.  Your next appointment:   9 months  The format for your next appointment:   In Person  Provider:   Jyl Heinz, MD

## 2019-09-01 ENCOUNTER — Other Ambulatory Visit: Payer: Self-pay

## 2019-09-07 DIAGNOSIS — Z8616 Personal history of COVID-19: Secondary | ICD-10-CM

## 2019-09-07 HISTORY — DX: Personal history of COVID-19: Z86.16

## 2019-12-13 DIAGNOSIS — Z170 Estrogen receptor positive status [ER+]: Secondary | ICD-10-CM

## 2019-12-13 DIAGNOSIS — M899 Disorder of bone, unspecified: Secondary | ICD-10-CM | POA: Diagnosis not present

## 2019-12-13 DIAGNOSIS — C50912 Malignant neoplasm of unspecified site of left female breast: Secondary | ICD-10-CM

## 2020-07-27 ENCOUNTER — Other Ambulatory Visit: Payer: Self-pay | Admitting: Cardiology

## 2020-08-02 DIAGNOSIS — Z7189 Other specified counseling: Secondary | ICD-10-CM | POA: Diagnosis not present

## 2020-08-02 DIAGNOSIS — Z1331 Encounter for screening for depression: Secondary | ICD-10-CM | POA: Diagnosis not present

## 2020-08-02 DIAGNOSIS — Z0000 Encounter for general adult medical examination without abnormal findings: Secondary | ICD-10-CM | POA: Diagnosis not present

## 2020-08-02 DIAGNOSIS — Z79899 Other long term (current) drug therapy: Secondary | ICD-10-CM | POA: Diagnosis not present

## 2020-08-02 DIAGNOSIS — E039 Hypothyroidism, unspecified: Secondary | ICD-10-CM | POA: Diagnosis not present

## 2020-08-02 DIAGNOSIS — G620 Drug-induced polyneuropathy: Secondary | ICD-10-CM | POA: Diagnosis not present

## 2020-08-02 DIAGNOSIS — Z1339 Encounter for screening examination for other mental health and behavioral disorders: Secondary | ICD-10-CM | POA: Diagnosis not present

## 2020-08-02 DIAGNOSIS — E785 Hyperlipidemia, unspecified: Secondary | ICD-10-CM | POA: Diagnosis not present

## 2020-08-02 DIAGNOSIS — D6869 Other thrombophilia: Secondary | ICD-10-CM | POA: Diagnosis not present

## 2020-08-02 DIAGNOSIS — I480 Paroxysmal atrial fibrillation: Secondary | ICD-10-CM | POA: Diagnosis not present

## 2020-08-25 DIAGNOSIS — H029 Unspecified disorder of eyelid: Secondary | ICD-10-CM | POA: Diagnosis not present

## 2020-09-19 DIAGNOSIS — Z1231 Encounter for screening mammogram for malignant neoplasm of breast: Secondary | ICD-10-CM | POA: Diagnosis not present

## 2020-09-20 ENCOUNTER — Encounter: Payer: Self-pay | Admitting: Pharmacist

## 2020-09-20 DIAGNOSIS — M8589 Other specified disorders of bone density and structure, multiple sites: Secondary | ICD-10-CM | POA: Insufficient documentation

## 2020-09-20 DIAGNOSIS — M1990 Unspecified osteoarthritis, unspecified site: Secondary | ICD-10-CM | POA: Insufficient documentation

## 2020-09-20 DIAGNOSIS — R200 Anesthesia of skin: Secondary | ICD-10-CM | POA: Insufficient documentation

## 2020-09-20 DIAGNOSIS — J189 Pneumonia, unspecified organism: Secondary | ICD-10-CM | POA: Insufficient documentation

## 2020-09-20 HISTORY — DX: Other specified disorders of bone density and structure, multiple sites: M85.89

## 2020-09-21 ENCOUNTER — Encounter: Payer: Self-pay | Admitting: Cardiology

## 2020-09-21 ENCOUNTER — Telehealth: Payer: Self-pay

## 2020-09-21 ENCOUNTER — Ambulatory Visit (INDEPENDENT_AMBULATORY_CARE_PROVIDER_SITE_OTHER): Payer: Medicare PPO | Admitting: Cardiology

## 2020-09-21 ENCOUNTER — Other Ambulatory Visit: Payer: Self-pay

## 2020-09-21 VITALS — BP 132/70 | HR 81 | Ht 63.0 in | Wt 182.2 lb

## 2020-09-21 DIAGNOSIS — E782 Mixed hyperlipidemia: Secondary | ICD-10-CM

## 2020-09-21 DIAGNOSIS — I480 Paroxysmal atrial fibrillation: Secondary | ICD-10-CM

## 2020-09-21 DIAGNOSIS — E669 Obesity, unspecified: Secondary | ICD-10-CM

## 2020-09-21 DIAGNOSIS — E66811 Obesity, class 1: Secondary | ICD-10-CM

## 2020-09-21 HISTORY — DX: Obesity, class 1: E66.811

## 2020-09-21 HISTORY — DX: Obesity, unspecified: E66.9

## 2020-09-21 NOTE — Patient Instructions (Signed)

## 2020-09-21 NOTE — Telephone Encounter (Signed)
Lot # [id number] EXP 01/2022 4 boxes of Eliquis 5 mg given to pt per Dr. Geraldo Pitter.

## 2020-09-21 NOTE — Progress Notes (Signed)
Cardiology Office Note:    Date:  09/21/2020   ID:  Carrie Wilkerson, DOB [birth year], MRN [id number]  PCP:  Ernestene Kiel, MD  Cardiologist:  Jenean Lindau, MD   Referring MD: Ernestene Kiel, MD    ASSESSMENT:    1. Paroxysmal atrial fibrillation (HCC)   2. Mixed dyslipidemia    PLAN:    In order of problems listed above:  1. Primary prevention stressed with the patient.  Importance of compliance with diet medication stressed and she vocalized understanding.  She was advised to exercise using a stationary bicycle at least half an hour a day and she promises to try.  She finds it doing exercise bicycle to be better than walking. 2. Paroxysmal atrial fibrillation:I discussed with the patient atrial fibrillation, disease process. Management and therapy including rate and rhythm control, anticoagulation benefits and potential risks were discussed extensively with the patient. Patient had multiple questions which were answered to patient's satisfaction. 3. Mixed dyslipidemia: Diet was emphasized.  This is followed by primary care physician.  Patient is on statin therapy. 4. Obesity: Weight reduction was stressed and she promises to do better with diet. 5. Patient will be seen in follow-up appointment in 6 months or earlier if the patient has any concerns    Medication Adjustments/Labs and Tests Ordered: Current medicines are reviewed at length with the patient today.  Concerns regarding medicines are outlined above.  No orders of the defined types were placed in this encounter.  No orders of the defined types were placed in this encounter.    No chief complaint on file.    History of Present Illness:    Carrie Wilkerson is a 79 y.o. female.  Patient has past medical history of paroxysmal defibrillation and mixed dyslipidemia.  She denies any problems at this time and takes care of activities of daily living.  No chest pain orthopnea or PND.  At the  time of my evaluation, the patient is alert awake oriented and in no distress.  She does not exercise much because of orthopedic issues.  Past Medical History:  Diagnosis Date  . Arthritis   . 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 Cancer [facility])    Hx: of breast cancer in 2007  . 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 Incisional hernia, without obstruction or gangrene 10/22/2016  . Mixed dyslipidemia 11/03/2018  . Numbness in right leg    Hx: of  . Paroxysmal atrial fibrillation ([place]) 02/13/2016  . Pneumonia    Hx: of 'a long time ago"  . Postoperative examination 02/05/2016    Past Surgical History:  Procedure Laterality Date  . APPENDECTOMY    . BACK SURGERY    . BREAST SURGERY     Hx: of lumpectomy left breast 2007  . COLONOSCOPY     Hx: of  . DILATION AND CURETTAGE OF UTERUS    . TONSILLECTOMY    . 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 TOTAL HIP ARTHROPLASTY Right 04/13/2013   Procedure: RIGHT TOTAL HIP ARTHROPLASTY ANTERIOR APPROACH;  Surgeon: Mcarthur Rossetti, MD;  Location: [place];  Service: Orthopedics;  Laterality: Right;  . TUBAL LIGATION      Current Medications: Current Meds  Medication Sig  . atorvastatin (LIPITOR) 10 MG tablet Take 1 tablet by mouth every other day.  Marland Kitchen CALCIUM PO Take 1 tablet by mouth daily.  . cholecalciferol (VITAMIN D) 1000 UNITS tablet Take 1,000 Units by mouth  daily.  . denosumab (PROLIA) 60 MG/ML SOLN injection Inject 60 mg into the skin every 6 (six) months. Administer in upper arm, thigh, or abdomen  . ELIQUIS 5 MG TABS tablet TAKE 1 TABLET BY MOUTH TWICE A DAY  . Flaxseed, Linseed, OIL Take 1 capsule by mouth daily.  Marland Kitchen glucosamine-chondroitin 500-400 MG tablet Take 1 tablet by mouth 2 (two) times daily.   Marland Kitchen levothyroxine (SYNTHROID, LEVOTHROID) 75 MCG tablet Take 75 mcg by mouth daily.   . metoprolol tartrate (LOPRESSOR) 25 MG tablet Take 25 mg 2 (two) times daily by mouth.  . Multiple Vitamin (MULTIVITAMIN  WITH MINERALS) TABS Take 1 tablet by mouth daily.     Allergies:   Codeine and Sulfa antibiotics   Social History   Socioeconomic History  . Marital status: Married    Spouse name: Not on file  . Number of children: Not on file  . Years of education: Not on file  . Highest education level: Not on file  Occupational History  . Not on file  Tobacco Use  . Smoking status: Never Smoker  . Smokeless tobacco: Never Used  Vaping Use  . Vaping Use: Never used  Substance and Sexual Activity  . Alcohol use: No  . Drug use: No  . Sexual activity: Not on file  Other Topics Concern  . Not on file  Social History Narrative  . Not on file   Social Determinants of Health   Financial Resource Strain: Not on file  Food Insecurity: Not on file  Transportation Needs: Not on file  Physical Activity: Not on file  Stress: Not on file  Social Connections: Not on file     Family History: The patient's family history includes Cancer in an other family member; Hypertension in her brother and sister.  ROS:   Please see the history of present illness.    All other systems reviewed and are negative.  EKGs/Labs/Other Studies Reviewed:    The following studies were reviewed today: EKG reveals sinus rhythm and nonspecific ST-T changes   Recent Labs: No results found for requested labs within last 8760 hours.  Recent Lipid Panel    Component Value Date/Time   CHOL 172 11/03/2018 0907   TRIG 217 (H) 11/03/2018 0907   HDL 43 11/03/2018 0907   CHOLHDL 4.0 11/03/2018 0907   LDLCALC 86 11/03/2018 0907    Physical Exam:    VS:  BP 132/70   Pulse 81   Ht 5\' 3"  (1.6 m)   Wt 182 lb 3.2 oz (82.6 kg)   SpO2 97%   BMI 32.28 kg/m     Wt Readings from Last 3 Encounters:  09/21/20 182 lb 3.2 oz (82.6 kg)  08/11/19 180 lb 3.2 oz (81.7 kg)  11/03/18 184 lb 12.8 oz (83.8 kg)     GEN: Patient is in no acute distress HEENT: Normal NECK: No JVD; No carotid bruits LYMPHATICS: No  lymphadenopathy CARDIAC: Hear sounds regular, 2/6 systolic murmur at the apex. RESPIRATORY:  Clear to auscultation without rales, wheezing or rhonchi  ABDOMEN: Soft, non-tender, non-distended MUSCULOSKELETAL:  No edema; No deformity  SKIN: Warm and dry NEUROLOGIC:  Alert and oriented x 3 PSYCHIATRIC:  Normal affect   Signed, Jenean Lindau, MD  09/21/2020 1:57 PM    [facility]

## 2020-11-20 ENCOUNTER — Other Ambulatory Visit: Payer: Self-pay | Admitting: Pharmacist

## 2020-12-13 NOTE — Progress Notes (Signed)
[facility]  [address] [place],  [place]  [address] [phone]  Clinic Day:  12/15/2020  Referring physician: Ernestene Kiel, MD  This document serves as a record of services personally performed by Hosie Poisson, MD. It was created on their behalf by Curry,Lauren E, a trained medical scribe. The creation of this record is based on the scribe's personal observations and the provider's statements to them.  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 She was treated with lumpectomy.  Pathology revealed a 1.5 cm, grade 1, invasive ductal carcinoma with 5 positive nodes.  Estrogen and progesterone receptors were positive and Her-2 Neu negative.  She received adjuvant chemotherapy with Adriamycin/Cytoxan for 3 months, followed by Taxol for 3 months.  She then received adjuvant radiation to the left breast.  She was placed on letrozole in April 2008 and completed 10 years of adjuvant hormonal therapy in April 2018.  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   Repeat bone density in September 2017 revealed slight improvement in the bone density, with a T-score of -2.1 in the forearm and a T-score of -1.1 in the femur with, so she has continued denosumab every 6 months, in addition to calcium/vitamin D.  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    She had reversal of her colostomy in October 2017.  She has an incisional hernia in the abdomen, which does not bother her, so she has decided against  surgical repair.  She has atrial fibrillation and is on apixaban 5 mg twice daily.  Bone density scan in September 2019 revealed improvement in the bone density with a T-score of -1.6 in forearm.  The bone density of the femur had normalized with a T-score of -0.8.  She has continued denosumab every 6 months.  Bilateral diagnostic mammogram in November 2019 did not reveal any evidence of malignancy.  Annual bilateral mammogram in December 2020 did not reveal any evidence of malignancy.  She contracted COVID-19 later in December 2020.  She did not have to be hospitalized and fully recovered.  Bone density from September 2021 revealed mildly worsened osteopenia with a T-score of -2.0 of the left forearm radius, previously -1.6.  Left femur neck is normal at -0.9, previously -0.8.  She continues calcium 1200 mg with vitamin-D daily.  INTERVAL HISTORY:  Ezell is here for routine follow up and states that she has been doing well.  Annual bilateral mammogram from December 2021 was clear.  Blood counts and chemistries are unremarkable.  Her  appetite is good, and she has gained 1 and 1/2 pounds since her last visit.  She denies fever, chills or other signs of infection.  She denies nausea, vomiting, bowel issues, or abdominal pain.  She denies sore throat, cough, dyspnea, or chest pain.   REVIEW OF SYSTEMS:  Review of Systems  Constitutional: Negative.  Negative for appetite change, chills, fatigue, fever and unexpected weight change.  HENT:  Negative.   Eyes: Negative.   Respiratory: Negative.  Negative for chest tightness,  cough, hemoptysis, shortness of breath and wheezing.   Cardiovascular: Negative.  Negative for chest pain, leg swelling and palpitations.  Gastrointestinal: Negative.  Negative for abdominal distention, abdominal pain, blood in stool, constipation, diarrhea, nausea and vomiting.  Endocrine: Negative.   Genitourinary: Negative.  Negative for difficulty urinating, dysuria, frequency and  hematuria.   Musculoskeletal: Negative.  Negative for arthralgias, back pain, flank pain, gait problem and myalgias.  Skin: Negative.   Neurological: Negative.  Negative for dizziness, extremity weakness, gait problem, headaches, light-headedness, numbness, seizures and speech difficulty.  Hematological: Negative.   Psychiatric/Behavioral: Negative.  Negative for depression and sleep disturbance. The patient is not nervous/anxious.      VITALS:  Blood pressure (!) 161/72, pulse 74, temperature 97.8 F (36.6 C), temperature source Oral, resp. rate 18, height 5' 3"  (1.6 m), weight 182 lb 3.2 oz (82.6 kg), SpO2 93 %.  Wt Readings from Last 3 Encounters:  12/15/20 182 lb 3.2 oz (82.6 kg)  09/21/20 182 lb 3.2 oz (82.6 kg)  08/11/19 180 lb 3.2 oz (81.7 kg)    Body mass index is 32.28 kg/m.  Performance status (ECOG): 0 - Asymptomatic  PHYSICAL EXAM:  Physical Exam Constitutional:      General: She is not in acute distress.    Appearance: Normal appearance. She is normal weight.  HENT:     Head: Normocephalic and atraumatic.  Eyes:     General: No scleral icterus.    Extraocular Movements: Extraocular movements intact.     Conjunctiva/sclera: Conjunctivae normal.     Pupils: Pupils are equal, round, and reactive to light.  Cardiovascular:     Rate and Rhythm: Normal rate and regular rhythm.     Pulses: Normal pulses.     Heart sounds: Normal heart sounds. No murmur heard. No friction rub. No gallop.   Pulmonary:     Effort: Pulmonary effort is normal. No respiratory distress.     Breath sounds: Normal breath sounds.  Chest:  Breasts:     Right: Normal.     Left: Normal.      Comments: Deep well healed scar in the lower outer quadrant of the left breast.  She has some nipple inversion of the left breast.  She also has a well healed axillary scar.  No masses in either breast.   Abdominal:     General: Bowel sounds are normal. There is no distension.     Palpations: Abdomen is  soft. There is no hepatomegaly, splenomegaly or mass.     Tenderness: There is no abdominal tenderness.     Comments: She has an old scar in the lower mid abdomen  Musculoskeletal:        General: Normal range of motion.     Cervical back: Normal range of motion and neck supple.     Right lower leg: No edema.     Left lower leg: No edema.  Lymphadenopathy:     Cervical: No cervical adenopathy.  Skin:    General: Skin is warm and dry.  Neurological:     General: No focal deficit present.     Mental Status: She is alert and oriented to person, place, and time. Mental status is at baseline.  Psychiatric:        Mood and Affect: Mood normal.        Behavior: Behavior normal.        Thought Content: Thought content normal.        Judgment: Judgment normal.  LABS:   CBC Latest Ref Rng & Units 11/03/2018 04/16/2013 04/15/2013  WBC 3.4 - 10.8 x10E3/uL 5.2 7.3 6.7  Hemoglobin 11.1 - 15.9 g/dL 14.5 9.1(L) 9.8(L)  Hematocrit 34.0 - 46.6 % 42.6 26.7(L) 29.3(L)  Platelets 150 - 450 x10E3/uL 257 181 169   CMP Latest Ref Rng & Units 11/03/2018 04/14/2013 04/13/2013  Glucose 65 - 99 mg/dL 107(H) 90 90  BUN 8 - 27 mg/dL 14 12 18   Creatinine 0.57 - 1.00 mg/dL 0.84 0.77 0.70  Sodium 134 - 144 mmol/L 143 137 141  Potassium 3.5 - 5.2 mmol/L 4.4 4.2 4.2  Chloride 96 - 106 mmol/L 103 105 107  CO2 20 - 29 mmol/L 24 28 26   Calcium 8.7 - 10.3 mg/dL 9.3 8.0(L) 9.2  Total Protein 6.0 - 8.5 g/dL 6.7 - -  Total Bilirubin 0.0 - 1.2 mg/dL 0.3 - -  Alkaline Phos 39 - 117 IU/L 60 - -  AST 0 - 40 IU/L 21 - -  ALT 0 - 32 IU/L 24 - -    STUDIES:   EXAM: 09/19/2020 DIGITAL SCREENING BILATERAL MAMMOGRAM WITH TOMO AND CAD  COMPARISON:  Previous exam(s).  ACR Breast Density Category b: There are scattered areas of fibroglandular density.  FINDINGS: There are no findings suspicious for malignancy. Images were processed with CAD.  IMPRESSION: No mammographic evidence of malignancy. A result letter of  this screening mammogram will be mailed directly to the patient.   Allergies:  Allergies  Allergen Reactions  . Codeine     unknown   . Sulfa Antibiotics     unknown     Current Medications: Current Outpatient Medications  Medication Sig Dispense Refill  . atorvastatin (LIPITOR) 10 MG tablet Take 1 tablet by mouth every other day.  4  . CALCIUM PO Take 1 tablet by mouth daily.    . cholecalciferol (VITAMIN D) 1000 UNITS tablet Take 1,000 Units by mouth daily.    Marland Kitchen denosumab (PROLIA) 60 MG/ML SOLN injection Inject 60 mg into the skin every 6 (six) months. Administer in upper arm, thigh, or abdomen    . ELIQUIS 5 MG TABS tablet TAKE 1 TABLET BY MOUTH TWICE A DAY 180 tablet 3  . Flaxseed, Linseed, OIL Take 1 capsule by mouth daily.    Marland Kitchen glucosamine-chondroitin 500-400 MG tablet Take 1 tablet by mouth 2 (two) times daily.     Marland Kitchen levothyroxine (SYNTHROID, LEVOTHROID) 75 MCG tablet Take 75 mcg by mouth daily.   7  . metoprolol tartrate (LOPRESSOR) 25 MG tablet Take 25 mg 2 (two) times daily by mouth.    . Multiple Vitamin (MULTIVITAMIN WITH MINERALS) TABS Take 1 tablet by mouth daily.     No current facility-administered medications for this visit.     ASSESSMENT & PLAN:   Assessment:   1. 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  She remains without evidence of recurrence.  She will be due for mammogram in December.  2. Osteopenia, for which she has been receiving denosumab every 6 months.  She continues calcium and vitamin-D as recommended.  She will be due for repeat bone density in September 2023.  Plan: We will proceed with denosumab on March 14th.  She knows to continue calcium and vitamin-D daily.  We will plan to see her back in 6 months with a CBC and comprehensive metabolic panel prior to her next  denosumab.  The patient understands  the plans discussed today and is in agreement with them.  She knows to contact our office if she develops concerns prior to her next appointment.   I provided 25 minutes of face-to-face time during this this encounter and > 50% was spent counseling as documented under my assessment and plan.    Derwood Kaplan, MD [facility] [address] [place] [address] Dept: [phone] Dept Fax: [phone]   I, Rita Ohara, am acting as scribe for Derwood Kaplan, MD  I have reviewed this report as typed by the medical scribe, and it is complete and accurate.  Hermina Barters

## 2020-12-15 ENCOUNTER — Inpatient Hospital Stay: Payer: Medicare HMO | Attending: Oncology

## 2020-12-15 ENCOUNTER — Encounter: Payer: Self-pay | Admitting: Oncology

## 2020-12-15 ENCOUNTER — Other Ambulatory Visit: Payer: Self-pay | Admitting: Hematology and Oncology

## 2020-12-15 ENCOUNTER — Other Ambulatory Visit: Payer: Self-pay | Admitting: Oncology

## 2020-12-15 ENCOUNTER — Inpatient Hospital Stay (INDEPENDENT_AMBULATORY_CARE_PROVIDER_SITE_OTHER): Payer: Medicare HMO | Admitting: Oncology

## 2020-12-15 ENCOUNTER — Telehealth: Payer: Self-pay | Admitting: Oncology

## 2020-12-15 ENCOUNTER — Other Ambulatory Visit: Payer: Self-pay

## 2020-12-15 VITALS — BP 161/72 | HR 74 | Temp 97.8°F | Resp 18 | Ht 63.0 in | Wt 182.2 lb

## 2020-12-15 DIAGNOSIS — M8589 Other specified disorders of bone density and structure, multiple sites: Secondary | ICD-10-CM | POA: Diagnosis not present

## 2020-12-15 DIAGNOSIS — Z170 Estrogen receptor positive status [ER+]: Secondary | ICD-10-CM

## 2020-12-15 DIAGNOSIS — D649 Anemia, unspecified: Secondary | ICD-10-CM | POA: Diagnosis not present

## 2020-12-15 DIAGNOSIS — C50912 Malignant neoplasm of unspecified site of left female breast: Secondary | ICD-10-CM | POA: Diagnosis not present

## 2020-12-15 DIAGNOSIS — M8580 Other specified disorders of bone density and structure, unspecified site: Secondary | ICD-10-CM | POA: Insufficient documentation

## 2020-12-15 DIAGNOSIS — M85832 Other specified disorders of bone density and structure, left forearm: Secondary | ICD-10-CM | POA: Insufficient documentation

## 2020-12-15 DIAGNOSIS — C801 Malignant (primary) neoplasm, unspecified: Secondary | ICD-10-CM | POA: Diagnosis not present

## 2020-12-15 LAB — COMPREHENSIVE METABOLIC PANEL
Albumin: 4 (ref 3.5–5.0)
Calcium: 9.6 (ref 8.7–10.7)

## 2020-12-15 LAB — BASIC METABOLIC PANEL
BUN: 16 (ref 4–21)
CO2: 27 — AB (ref 13–22)
Chloride: 108 (ref 99–108)
Creatinine: 0.6 (ref 0.5–1.1)
Glucose: 102
Potassium: 4.3 (ref 3.4–5.3)
Sodium: 140 (ref 137–147)

## 2020-12-15 LAB — CBC AND DIFFERENTIAL
HCT: 43 (ref 36–46)
Hemoglobin: 14.4 (ref 12.0–16.0)
Neutrophils Absolute: 2.65
Platelets: 249 (ref 150–399)
WBC: 5

## 2020-12-15 LAB — HEPATIC FUNCTION PANEL
ALT: 28 (ref 7–35)
AST: 35 (ref 13–35)
Alkaline Phosphatase: 44 (ref 25–125)
Bilirubin, Total: 0.6

## 2020-12-15 LAB — CBC
MCV: 92 (ref 81–99)
RBC: 4.62 (ref 3.87–5.11)

## 2020-12-15 NOTE — Telephone Encounter (Signed)
Per 3/11 LOS, patient scheduled for Sept Appt's (Follow Up w/Kelli).  Gave patient Appt Summary

## 2020-12-18 ENCOUNTER — Inpatient Hospital Stay: Payer: Medicare HMO

## 2020-12-18 ENCOUNTER — Other Ambulatory Visit: Payer: Self-pay

## 2020-12-18 VITALS — BP 124/59 | HR 70 | Temp 98.2°F | Resp 18 | Ht 63.0 in | Wt 181.0 lb

## 2020-12-18 DIAGNOSIS — M8580 Other specified disorders of bone density and structure, unspecified site: Secondary | ICD-10-CM | POA: Diagnosis not present

## 2020-12-18 DIAGNOSIS — M85832 Other specified disorders of bone density and structure, left forearm: Secondary | ICD-10-CM | POA: Diagnosis not present

## 2020-12-18 DIAGNOSIS — M8589 Other specified disorders of bone density and structure, multiple sites: Secondary | ICD-10-CM

## 2020-12-18 MED ORDER — DENOSUMAB 60 MG/ML ~~LOC~~ SOSY
60.0000 mg | PREFILLED_SYRINGE | Freq: Once | SUBCUTANEOUS | Status: AC
  Administered 2020-12-18: 60 mg via SUBCUTANEOUS

## 2020-12-18 MED ORDER — DENOSUMAB 60 MG/ML ~~LOC~~ SOSY
PREFILLED_SYRINGE | SUBCUTANEOUS | Status: AC
  Filled 2020-12-18: qty 1

## 2020-12-18 NOTE — Patient Instructions (Signed)
Denosumab injection What is this medicine? DENOSUMAB (den oh sue mab) slows bone breakdown. Prolia is used to treat osteoporosis in women after menopause and in men, and in people who are taking corticosteroids for 6 months or more. Xgeva is used to treat a high calcium level due to cancer and to prevent bone fractures and other bone problems caused by multiple myeloma or cancer bone metastases. Xgeva is also used to treat giant cell tumor of the bone. This medicine may be used for other purposes; ask your health care provider or pharmacist if you have questions. COMMON BRAND NAME(S): Prolia, XGEVA What should I tell my health care provider before I take this medicine? They need to know if you have any of these conditions:  dental disease  having surgery or tooth extraction  infection  kidney disease  low levels of calcium or Vitamin D in the blood  malnutrition  on hemodialysis  skin conditions or sensitivity  thyroid or parathyroid disease  an unusual reaction to denosumab, other medicines, foods, dyes, or preservatives  pregnant or trying to get pregnant  breast-feeding How should I use this medicine? This medicine is for injection under the skin. It is given by a health care professional in a hospital or clinic setting. A special MedGuide will be given to you before each treatment. Be sure to read this information carefully each time. For Prolia, talk to your pediatrician regarding the use of this medicine in children. Special care may be needed. For Xgeva, talk to your pediatrician regarding the use of this medicine in children. While this drug may be prescribed for children as young as 13 years for selected conditions, precautions do apply. Overdosage: If you think you have taken too much of this medicine contact a poison control center or emergency room at once. NOTE: This medicine is only for you. Do not share this medicine with others. What if I miss a dose? It is  important not to miss your dose. Call your doctor or health care professional if you are unable to keep an appointment. What may interact with this medicine? Do not take this medicine with any of the following medications:  other medicines containing denosumab This medicine may also interact with the following medications:  medicines that lower your chance of fighting infection  steroid medicines like prednisone or cortisone This list may not describe all possible interactions. Give your health care provider a list of all the medicines, herbs, non-prescription drugs, or dietary supplements you use. Also tell them if you smoke, drink alcohol, or use illegal drugs. Some items may interact with your medicine. What should I watch for while using this medicine? Visit your doctor or health care professional for regular checks on your progress. Your doctor or health care professional may order blood tests and other tests to see how you are doing. Call your doctor or health care professional for advice if you get a fever, chills or sore throat, or other symptoms of a cold or flu. Do not treat yourself. This drug may decrease your body's ability to fight infection. Try to avoid being around people who are sick. You should make sure you get enough calcium and vitamin D while you are taking this medicine, unless your doctor tells you not to. Discuss the foods you eat and the vitamins you take with your health care professional. See your dentist regularly. Brush and floss your teeth as directed. Before you have any dental work done, tell your dentist you are   receiving this medicine. Do not become pregnant while taking this medicine or for 5 months after stopping it. Talk with your doctor or health care professional about your birth control options while taking this medicine. Women should inform their doctor if they wish to become pregnant or think they might be pregnant. There is a potential for serious side  effects to an unborn child. Talk to your health care professional or pharmacist for more information. What side effects may I notice from receiving this medicine? Side effects that you should report to your doctor or health care professional as soon as possible:  allergic reactions like skin rash, itching or hives, swelling of the face, lips, or tongue  bone pain  breathing problems  dizziness  jaw pain, especially after dental work  redness, blistering, peeling of the skin  signs and symptoms of infection like fever or chills; cough; sore throat; pain or trouble passing urine  signs of low calcium like fast heartbeat, muscle cramps or muscle pain; pain, tingling, numbness in the hands or feet; seizures  unusual bleeding or bruising  unusually weak or tired Side effects that usually do not require medical attention (report to your doctor or health care professional if they continue or are bothersome):  constipation  diarrhea  headache  joint pain  loss of appetite  muscle pain  runny nose  tiredness  upset stomach This list may not describe all possible side effects. Call your doctor for medical advice about side effects. You may report side effects to FDA at 1-800-FDA-1088. Where should I keep my medicine? This medicine is only given in a clinic, doctor's office, or other health care setting and will not be stored at home. NOTE: This sheet is a summary. It may not cover all possible information. If you have questions about this medicine, talk to your doctor, pharmacist, or health care provider.  2021 Elsevier/Gold Standard (2018-01-30 16:10:44)

## 2020-12-25 DIAGNOSIS — E785 Hyperlipidemia, unspecified: Secondary | ICD-10-CM | POA: Diagnosis not present

## 2020-12-25 DIAGNOSIS — E039 Hypothyroidism, unspecified: Secondary | ICD-10-CM | POA: Diagnosis not present

## 2020-12-25 DIAGNOSIS — I10 Essential (primary) hypertension: Secondary | ICD-10-CM | POA: Diagnosis not present

## 2020-12-25 DIAGNOSIS — E669 Obesity, unspecified: Secondary | ICD-10-CM | POA: Diagnosis not present

## 2020-12-25 DIAGNOSIS — M810 Age-related osteoporosis without current pathological fracture: Secondary | ICD-10-CM | POA: Diagnosis not present

## 2020-12-25 DIAGNOSIS — Z7901 Long term (current) use of anticoagulants: Secondary | ICD-10-CM | POA: Diagnosis not present

## 2020-12-25 DIAGNOSIS — M1990 Unspecified osteoarthritis, unspecified site: Secondary | ICD-10-CM | POA: Diagnosis not present

## 2020-12-25 DIAGNOSIS — I4891 Unspecified atrial fibrillation: Secondary | ICD-10-CM | POA: Diagnosis not present

## 2020-12-25 DIAGNOSIS — Z6832 Body mass index (BMI) 32.0-32.9, adult: Secondary | ICD-10-CM | POA: Diagnosis not present

## 2020-12-25 DIAGNOSIS — D6869 Other thrombophilia: Secondary | ICD-10-CM | POA: Diagnosis not present

## 2021-02-01 DIAGNOSIS — Z79899 Other long term (current) drug therapy: Secondary | ICD-10-CM | POA: Diagnosis not present

## 2021-02-01 DIAGNOSIS — E039 Hypothyroidism, unspecified: Secondary | ICD-10-CM | POA: Diagnosis not present

## 2021-02-01 DIAGNOSIS — Z6833 Body mass index (BMI) 33.0-33.9, adult: Secondary | ICD-10-CM | POA: Diagnosis not present

## 2021-02-01 DIAGNOSIS — J302 Other seasonal allergic rhinitis: Secondary | ICD-10-CM | POA: Diagnosis not present

## 2021-02-01 DIAGNOSIS — M25561 Pain in right knee: Secondary | ICD-10-CM | POA: Diagnosis not present

## 2021-02-01 DIAGNOSIS — I480 Paroxysmal atrial fibrillation: Secondary | ICD-10-CM | POA: Diagnosis not present

## 2021-02-01 DIAGNOSIS — E785 Hyperlipidemia, unspecified: Secondary | ICD-10-CM | POA: Diagnosis not present

## 2021-03-21 ENCOUNTER — Other Ambulatory Visit: Payer: Self-pay

## 2021-03-21 ENCOUNTER — Encounter: Payer: Self-pay | Admitting: Cardiology

## 2021-03-21 ENCOUNTER — Ambulatory Visit: Payer: Medicare HMO | Admitting: Cardiology

## 2021-03-21 VITALS — BP 134/66 | HR 72 | Ht 62.0 in | Wt 177.0 lb

## 2021-03-21 DIAGNOSIS — I480 Paroxysmal atrial fibrillation: Secondary | ICD-10-CM | POA: Diagnosis not present

## 2021-03-21 DIAGNOSIS — E782 Mixed hyperlipidemia: Secondary | ICD-10-CM

## 2021-03-21 DIAGNOSIS — E669 Obesity, unspecified: Secondary | ICD-10-CM | POA: Diagnosis not present

## 2021-03-21 NOTE — Progress Notes (Signed)
Cardiology Office Note:    Date:  03/21/2021   ID:  Carrie Wilkerson, DOB 04-Mar-1941, MRN [id number]  PCP:  Ernestene Kiel, MD  Cardiologist:  Jenean Lindau, MD   Referring MD: Ernestene Kiel, MD    ASSESSMENT:    1. Paroxysmal atrial fibrillation (HCC)   2. Mixed dyslipidemia   3. Obesity (BMI 30.0-34.9)    PLAN:    In order of problems listed above:  Primary prevention stressed with the patient.  Importance of compliance with diet medication stressed and she vocalized understanding.  She was asked to ambulate to the best of her ability.  She promises to do so. Paroxysmal atrial fibrillation:I discussed with the patient atrial fibrillation, disease process. Management and therapy including rate and rhythm control, anticoagulation benefits and potential risks were discussed extensively with the patient. Patient had multiple questions which were answered to patient's satisfaction. Mixed dyslipidemia: Lipids were reviewed.  These are followed by primary care.  She gets her lipids checked on a regular basis. Essential hypertension and obesity: Lifestyle modification was urged.  Weight reduction was stressed risks of obesity explained and she vocalized understanding.  Salt intake issues and diet emphasized. Patient will be seen in follow-up appointment in 6 months or earlier if the patient has any concerns    Medication Adjustments/Labs and Tests Ordered: Current medicines are reviewed at length with the patient today.  Concerns regarding medicines are outlined above.  No orders of the defined types were placed in this encounter.  No orders of the defined types were placed in this encounter.    No chief complaint on file.    History of Present Illness:    Carrie Wilkerson is a 80 y.o. female.  Patient has past medical history of paroxysmal atrial fibrillation, mixed dyslipidemia and essential hypertension.  She overall leads a sedentary lifestyle  because of orthopedic issues.  No chest pain orthopnea or PND.  At the time of my evaluation, the patient is alert awake oriented and in no distress.  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   Incisional hernia, without obstruction or gangrene 10/22/2016   Mixed dyslipidemia 11/03/2018   Numbness in right leg    Hx: of   Obesity (BMI 30.0-34.9) 09/21/2020   Other specified disorders of bone density and structure, multiple sites 09/20/2020   Paroxysmal atrial fibrillation ([place]) 02/13/2016   Pneumonia    Hx: of 'a long time ago"   Postoperative examination 02/05/2016    Past Surgical History:  Procedure Laterality Date   APPENDECTOMY     BACK SURGERY     BREAST SURGERY     Hx: of lumpectomy left breast 2007   COLONOSCOPY     Hx: of   DILATION AND CURETTAGE OF UTERUS     TONSILLECTOMY     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   atorvastatin (  LIPITOR) 10 MG tablet Take 1 tablet by mouth every other day.   CALCIUM PO Take 1 tablet by mouth daily.   cholecalciferol (VITAMIN D) 1000 UNITS tablet Take 1,000 Units by mouth daily.   denosumab (PROLIA) 60 MG/ML SOLN injection Inject 60 mg into the skin every 6 (six) months. Administer in upper arm, thigh, or abdomen   Flaxseed, Linseed, OIL Take 1 capsule by mouth daily.   glucosamine-chondroitin 500-400 MG tablet Take 1 tablet by mouth 2 (two) times daily.    levothyroxine (SYNTHROID,  LEVOTHROID) 75 MCG tablet Take 75 mcg by mouth daily.    metoprolol tartrate (LOPRESSOR) 25 MG tablet Take 25 mg by mouth 2 (two) times daily.   Multiple Vitamin (MULTIVITAMIN WITH MINERALS) TABS Take 1 tablet by mouth daily.     Allergies:   Codeine and Sulfa antibiotics   Social History   Socioeconomic History   Marital status: Married    Spouse name: Not on file   Number of children: Not on file   Years of education: Not on file   Highest education level: Not on file  Occupational History   Not on file  Tobacco Use   Smoking status: Never   Smokeless tobacco: Never  Vaping Use   Vaping Use: Never used  Substance and Sexual Activity   Alcohol use: No   Drug use: No   Sexual activity: Not on file  Other Topics Concern   Not on file  Social History Narrative   Not on file   Social Determinants of Health   Financial Resource Strain: Not on file  Food Insecurity: Not on file  Transportation Needs: Not on file  Physical Activity: Not on file  Stress: Not on file  Social Connections: Not on file     Family History: The patient's family history includes Cancer in an other family member; Hypertension in her brother and sister.  ROS:   Please see the history of present illness.    All other systems reviewed and are negative.  EKGs/Labs/Other Studies Reviewed:    The following studies were reviewed today: I discussed my findings with the patient in extensive length   Recent Labs: 12/15/2020: ALT 28; BUN 16; Creatinine 0.6; Hemoglobin 14.4; Platelets 249; Potassium 4.3; Sodium 140  Recent Lipid Panel    Component Value Date/Time   CHOL 172 11/03/2018 0907   TRIG 217 (H) 11/03/2018 0907   HDL 43 11/03/2018 0907   CHOLHDL 4.0 11/03/2018 0907   LDLCALC 86 11/03/2018 0907    Physical Exam:    VS:  BP 134/66   Pulse 72   Ht 5\' 2"  (1.575 m)   Wt 177 lb (80.3 kg)   SpO2 95%   BMI 32.37 kg/m     Wt Readings from Last 3 Encounters:  03/21/21 177 lb (80.3 kg)   12/18/20 181 lb 0.6 oz (82.1 kg)  12/15/20 182 lb 3.2 oz (82.6 kg)     GEN: Patient is in no acute distress HEENT: Normal NECK: No JVD; No carotid bruits LYMPHATICS: No lymphadenopathy CARDIAC: Hear sounds regular, 2/6 systolic murmur at the apex. RESPIRATORY:  Clear to auscultation without rales, wheezing or rhonchi  ABDOMEN: Soft, non-tender, non-distended MUSCULOSKELETAL:  No edema; No deformity  SKIN: Warm and dry NEUROLOGIC:  Alert and oriented x 3 PSYCHIATRIC:  Normal affect   Signed, Jenean Lindau, MD  03/21/2021 10:22 AM    [facility]

## 2021-03-21 NOTE — Patient Instructions (Signed)

## 2021-03-22 ENCOUNTER — Ambulatory Visit: Payer: Medicare PPO | Admitting: Cardiology

## 2021-06-06 ENCOUNTER — Other Ambulatory Visit: Payer: Self-pay | Admitting: Pharmacist

## 2021-06-13 ENCOUNTER — Inpatient Hospital Stay (INDEPENDENT_AMBULATORY_CARE_PROVIDER_SITE_OTHER): Payer: Medicare HMO | Admitting: Hematology and Oncology

## 2021-06-13 ENCOUNTER — Encounter: Payer: Self-pay | Admitting: Hematology and Oncology

## 2021-06-13 ENCOUNTER — Other Ambulatory Visit: Payer: Self-pay | Admitting: Hematology and Oncology

## 2021-06-13 ENCOUNTER — Telehealth: Payer: Self-pay | Admitting: Oncology

## 2021-06-13 ENCOUNTER — Inpatient Hospital Stay: Payer: Medicare HMO | Attending: Hematology and Oncology

## 2021-06-13 VITALS — BP 95/54 | HR 100 | Temp 98.4°F | Resp 18 | Ht 62.0 in | Wt 177.0 lb

## 2021-06-13 DIAGNOSIS — C50912 Malignant neoplasm of unspecified site of left female breast: Secondary | ICD-10-CM | POA: Diagnosis not present

## 2021-06-13 DIAGNOSIS — M8589 Other specified disorders of bone density and structure, multiple sites: Secondary | ICD-10-CM

## 2021-06-13 DIAGNOSIS — Z853 Personal history of malignant neoplasm of breast: Secondary | ICD-10-CM

## 2021-06-13 DIAGNOSIS — I950 Idiopathic hypotension: Secondary | ICD-10-CM | POA: Diagnosis not present

## 2021-06-13 DIAGNOSIS — I959 Hypotension, unspecified: Secondary | ICD-10-CM | POA: Insufficient documentation

## 2021-06-13 DIAGNOSIS — Z1231 Encounter for screening mammogram for malignant neoplasm of breast: Secondary | ICD-10-CM

## 2021-06-13 DIAGNOSIS — M8580 Other specified disorders of bone density and structure, unspecified site: Secondary | ICD-10-CM | POA: Insufficient documentation

## 2021-06-13 HISTORY — DX: Hypotension, unspecified: I95.9

## 2021-06-13 HISTORY — DX: Personal history of malignant neoplasm of breast: Z85.3

## 2021-06-13 LAB — BASIC METABOLIC PANEL
BUN: 15 (ref 4–21)
CO2: 22 (ref 13–22)
Chloride: 108 (ref 99–108)
Creatinine: 0.8 (ref 0.5–1.1)
Glucose: 143
Potassium: 3.9 (ref 3.4–5.3)
Sodium: 141 (ref 137–147)

## 2021-06-13 LAB — COMPREHENSIVE METABOLIC PANEL
Albumin: 3.9 (ref 3.5–5.0)
Calcium: 9.1 (ref 8.7–10.7)

## 2021-06-13 LAB — CBC AND DIFFERENTIAL
HCT: 45 (ref 36–46)
Hemoglobin: 15 (ref 12.0–16.0)
Neutrophils Absolute: 3.25
Platelets: 243 (ref 150–399)
WBC: 5.7

## 2021-06-13 LAB — CBC: RBC: 4.72 (ref 3.87–5.11)

## 2021-06-13 LAB — HEPATIC FUNCTION PANEL
ALT: 16 (ref 7–35)
AST: 31 (ref 13–35)
Alkaline Phosphatase: 50 (ref 25–125)
Bilirubin, Total: 0.5

## 2021-06-13 NOTE — Assessment & Plan Note (Signed)
Remote history of left breast cancer, treated with lumpectomy, chemotherapy, radiation and hormonal therapy. She remains without evidence of recurrent disease. She will be due for bilateral screening mammogram in December. We will plan to see her back in 6 months for re-examination.

## 2021-06-13 NOTE — Telephone Encounter (Signed)
Per 9/7 LOS, patient scheduled for March 2023 Appt's.  Gave patient Appt Summary

## 2021-06-13 NOTE — Assessment & Plan Note (Addendum)
Possibly due to paroxysmal atrial fibrillation.  She is asymptomatic. She took her metoprolol after breakfast this morning.  She does not have any evidence infection.  I advised her to contact Dr. Geraldo Pitter if she develops any new symptoms, such as lightheadedness, dyspnea, chest pain.  She verbalizes understanding.

## 2021-06-13 NOTE — Assessment & Plan Note (Deleted)
The patient remains without evidence of recurrent disease. We will plan to see her back in 6 months with a CBC, comprehensive metabolic panel and bilateral screening mammogram.

## 2021-06-13 NOTE — Progress Notes (Signed)
Carrie Wilkerson  [address] [place],  [place]  [address] [phone]  Clinic Day:  06/13/2021  Referring physician: Ernestene Kiel, MD  ASSESSMENT & PLAN:   Assessment & Plan: History of left breast cancer Remote history of left breast cancer, treated with lumpectomy, chemotherapy, radiation and hormonal therapy. She remains without evidence of recurrent disease. She will be due for bilateral screening mammogram in December. We will plan to see her back in 6 months for re-examination.  Other specified disorders of bone density and structure, multiple sites Osteopenia, for which she is on Prolia every 6 months, in addition to calcium/vitamin D. She continues to tolerate Prolia well and will proceed her next injection on September 9th. She knows to continue calcium/vitamin D. We will plan to see her back in 6 months with a   CBC and comprehensive metabolic panel prior for repeat clinical assessment to her next Prolia.  She will be due for bone density scan in September 2023.  Hypotension Possibly due to paroxysmal atrial fibrillation.  She is asymptomatic. She took her metoprolol after breakfast this morning.  She does not have any evidence infection.  I advised her to contact Dr. Geraldo Pitter if she develops any new symptoms, such as lightheadedness, dyspnea, chest pain.  She verbalizes understanding.   The patient understands the plans discussed today and is in agreement with them.  She knows to contact our office if she develops concerns prior to her next appointment.     [name], PA-C    Orders Placed This Encounter  Procedures   MM DIGITAL SCREENING BILATERAL    Standing Status:   Future    Standing Expiration Date:   06/13/2022    Scheduling Instructions:     RH    Order Specific Question:   Reason for exam:    Answer:   screening, h/o left breast cancer    Order Specific Question:   Preferred imaging location?    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  Current Treatment:   Prolia every 6 months   HISTORY OF PRESENT ILLNESS:   Oncology History  Malignant neoplasm of upper-outer quadrant of left female breast ([facility])  03/07/2006 Initial Diagnosis   Breast cancer, left breast ([facility])   03/17/2006 Cancer Staging   Staging form: Breast, AJCC 6th Edition - Clinical stage from 03/17/2006: Stage IIIA (T1c, N2a, M0) - Signed by Derwood Kaplan, MD on 12/15/2020 Staged by: Managing physician Diagnostic confirmation: Positive histology Specimen type: Excision Histopathologic type: Infiltrating duct carcinoma, NOS Tumor size (mm): 15 Laterality: Left Total positive nodes: 5 Histologic grade (G): G1 Residual tumor (R): R0 - None Stage prefix: Initial diagnosis Lymphatic vessel invasion (L): L0 - No lymphatic vessel invasion Venous invasion (V): V0 - No venous invasion Prognostic indicators: ER/PR pos, HER 2 neg., Rx with AC x 3 mo, then Taxol x 3 mo.,AI x 10 yrs    She received adjuvant chemotherapy with Adriamycin/Cytoxan for 3 months, followed by Taxol for 3 months.  She then received adjuvant radiation to the left breast.  She was  placed on letrozole in April 2008 and completed 10 years of adjuvant hormonal therapy in April 2018.  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   Repeat bone density in September 2017 revealed slight improvement in the bone density, with a T-score of -2.1 in the forearm and a T-score of -1.1 in the femur with, so she has continued denosumab every 6  months, in addition to calcium/vitamin D. Bone density scan in September 2019 revealed improvement in the bone density with a T-score of -1.6 in forearm.  The bone density of the femur had normalized with a T-score of -0.8.  Bone density from September 2021 revealed mildly worsened osteopenia with a T-score of -2.0 of the left forearm radius, previously -1.6.  Left femur neck is normal at -0.9, previously -0.8. She has continued denosumab every 6 months, in addition to calcium 1200 mg with vitamin-D daily. Annual bilateral mammograms have remained without evidence of malignancy with the last being in December 2021.  INTERVAL HISTORY:  Carrie Wilkerson is here today for repeat clinical assessment. She denies any changes in her breasts.  Her only complaint is fatigue, which she states she has had since having COVID in December 2020. She denies fevers or chills. She denies pain. Her appetite is good. Her weight has been stable.  REVIEW OF SYSTEMS:  Review of Systems  Constitutional:  Positive for fatigue. Negative for appetite change, chills, fever and unexpected weight change.  HENT:   Negative for lump/mass, mouth sores and sore throat.   Respiratory:  Negative for cough and shortness of breath.   Cardiovascular:  Negative for chest pain, leg swelling and palpitations.  Gastrointestinal:  Negative for abdominal pain, constipation, diarrhea, nausea and vomiting.  Endocrine: Negative for hot flashes.  Genitourinary:  Negative for difficulty urinating, dysuria, frequency and hematuria.   Musculoskeletal:  Negative for arthralgias, back pain and myalgias.  Skin:  Negative for rash.  Neurological:  Negative for dizziness, headaches and light-headedness.  Hematological:  Negative for adenopathy. Does not bruise/bleed easily.  Psychiatric/Behavioral:  Negative for depression and sleep disturbance. The patient is not nervous/anxious.     VITALS:  Blood pressure (!) 95/54, pulse 100, temperature 98.4 F (36.9 C),  temperature source Oral, resp. rate 18, height '5\' 2"'$  (1.575 m), weight 177 lb (80.3 kg), SpO2 94 %.  Wt Readings from Last 3 Encounters:  06/13/21 177 lb (80.3 kg)  03/21/21 177 lb (80.3 kg)  12/18/20 181 lb 0.6 oz (82.1 kg)    Body mass index is 32.37 kg/m.  Performance status (ECOG): 0 - Asymptomatic  PHYSICAL EXAM:  Physical Exam Vitals and nursing note reviewed.  Constitutional:      General: She is not in acute distress.    Appearance: Normal appearance.  HENT:     Head: Normocephalic and atraumatic.     Mouth/Throat:     Mouth: Mucous membranes are moist.     Pharynx: Oropharynx is clear. No oropharyngeal exudate or posterior oropharyngeal erythema.  Eyes:     General: No scleral icterus.    Extraocular Movements: Extraocular movements intact.     Conjunctiva/sclera: Conjunctivae normal.     Pupils: Pupils are equal, round, and reactive to light.  Cardiovascular:     Rate and Rhythm: Normal rate. Rhythm irregularly irregular.     Heart sounds: Normal heart sounds. No murmur  heard.   No friction rub. No gallop.  Pulmonary:     Effort: Pulmonary effort is normal.     Breath sounds: Normal breath sounds. No wheezing, rhonchi or rales.  Chest:  Breasts:    Right: Inverted nipple (normal variant) present. No swelling, bleeding, mass, nipple discharge, skin change or tenderness.     Left: Inverted nipple (normal variant, with mild irritation of the left nipple, but no discharge) present. No swelling, bleeding, mass, nipple discharge, skin change or tenderness.  Abdominal:     General: There is no distension.     Palpations: Abdomen is soft. There is no hepatomegaly, splenomegaly or mass.     Tenderness: There is no abdominal tenderness.  Musculoskeletal:        General: Normal range of motion.     Cervical back: Normal range of motion and neck supple. No tenderness.     Right lower leg: No edema.     Left lower leg: No edema.  Lymphadenopathy:     Cervical: No cervical  adenopathy.     Upper Body:     Right upper body: No supraclavicular or axillary adenopathy.     Left upper body: No supraclavicular or axillary adenopathy.     Lower Body: No right inguinal adenopathy. No left inguinal adenopathy.  Skin:    General: Skin is warm and dry.     Coloration: Skin is not jaundiced.     Findings: No rash.  Neurological:     Mental Status: She is alert and oriented to person, place, and time.     Cranial Nerves: No cranial nerve deficit.  Psychiatric:        Mood and Affect: Mood normal.        Behavior: Behavior normal.        Thought Content: Thought content normal.    LABS:   CBC Latest Ref Rng & Units 06/13/2021 12/15/2020 11/03/2018  WBC - 5.7 5.0 5.2  Hemoglobin 12.0 - 16.0 15.0 14.4 14.5  Hematocrit 36 - 46 45 43 42.6  Platelets 150 - 399 243 249 257   CMP Latest Ref Rng & Units 06/13/2021 12/15/2020 11/03/2018  Glucose 65 - 99 mg/dL - - 107(H)  BUN 4 - '21 15 16 14  '$ Creatinine 0.5 - 1.1 0.8 0.6 0.84  Sodium 137 - 147 141 140 143  Potassium 3.4 - 5.3 3.9 4.3 4.4  Chloride 99 - 108 108 108 103  CO2 13 - 22 22 27(A) 24  Calcium 8.7 - 10.7 9.1 9.6 9.3  Total Protein 6.0 - 8.5 g/dL - - 6.7  Total Bilirubin 0.0 - 1.2 mg/dL - - 0.3  Alkaline Phos 25 - 125 50 44 60  AST 13 - 35 31 35 21  ALT 7 - 35 '16 28 24     '$ No results found for: CEA1 / No results found for: CEA1 No results found for: PSA1 No results found for: [id number] No results found for: CAN125  No results found for: TOTALPROTELP, ALBUMINELP, A1GS, A2GS, BETS, BETA2SER, GAMS, MSPIKE, SPEI No results found for: TIBC, FERRITIN, IRONPCTSAT No results found for: LDH  STUDIES:  No results found.    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   Incisional hernia, without obstruction  or gangrene  10/22/2016   Mixed dyslipidemia 11/03/2018   Numbness in right leg    Hx: of   Obesity (BMI 30.0-34.9) 09/21/2020   Other specified disorders of bone density and structure, multiple sites 09/20/2020   Paroxysmal atrial fibrillation ([facility]) 02/13/2016   Pneumonia    Hx: of 'a long time ago"   Postoperative examination 02/05/2016    Past Surgical History:  Procedure Laterality Date   APPENDECTOMY     BACK SURGERY     BREAST SURGERY     Hx: of lumpectomy left breast 2007   COLONOSCOPY     Hx: of   DILATION AND CURETTAGE OF UTERUS     TONSILLECTOMY     TOTAL HIP ARTHROPLASTY Right 04/13/2013   Dr Ninfa Linden   TOTAL HIP ARTHROPLASTY Right 04/13/2013   Procedure: RIGHT TOTAL HIP ARTHROPLASTY ANTERIOR APPROACH;  Surgeon: Mcarthur Rossetti, MD;  Location: [facility];  Service: Orthopedics;  Laterality: Right;   TUBAL LIGATION      Family History  Problem Relation Age of Onset   Hypertension Sister    Hypertension Brother    Cancer Other     Social History:  reports that she has never smoked. She has never used smokeless tobacco. She reports that she does not drink alcohol and does not use drugs.The patient is alone today.  Allergies:  Allergies  Allergen Reactions   Codeine     unknown    Sulfa Antibiotics     unknown     Current Medications: Current Outpatient Medications  Medication Sig Dispense Refill   apixaban (ELIQUIS) 5 MG TABS tablet Take 5 mg by mouth 2 (two) times daily.     atorvastatin (LIPITOR) 10 MG tablet Take 1 tablet by mouth every other day.  4   CALCIUM PO Take 1 tablet by mouth daily.     cholecalciferol (VITAMIN D) 1000 UNITS tablet Take 1,000 Units by mouth daily.     denosumab (PROLIA) 60 MG/ML SOLN injection Inject 60 mg into the skin every 6 (six) months. Administer in upper arm, thigh, or abdomen     Flaxseed, Linseed, OIL Take 1 capsule by mouth daily.     glucosamine-chondroitin 500-400 MG tablet Take 1 tablet by mouth 2 (two) times daily.       levothyroxine (SYNTHROID, LEVOTHROID) 75 MCG tablet Take 75 mcg by mouth daily.   7   metoprolol tartrate (LOPRESSOR) 25 MG tablet Take 25 mg by mouth 2 (two) times daily.     Multiple Vitamin (MULTIVITAMIN WITH MINERALS) TABS Take 1 tablet by mouth daily.     No current facility-administered medications for this visit.

## 2021-06-13 NOTE — Assessment & Plan Note (Signed)
Osteopenia, for which she is on Prolia every 6 months, in addition to calcium/vitamin D. She continues to tolerate Prolia well and will proceed her next injection on September 9th. She knows to continue calcium/vitamin D. We will plan to see her back in 6 months with a   CBC and comprehensive metabolic panel prior for repeat clinical assessment to her next Prolia.  She will be due for bone density scan in September 2023.

## 2021-06-15 ENCOUNTER — Ambulatory Visit: Payer: PRIVATE HEALTH INSURANCE | Admitting: Hematology and Oncology

## 2021-06-15 ENCOUNTER — Other Ambulatory Visit: Payer: PRIVATE HEALTH INSURANCE

## 2021-06-15 ENCOUNTER — Other Ambulatory Visit: Payer: Self-pay

## 2021-06-15 ENCOUNTER — Inpatient Hospital Stay: Payer: Medicare HMO

## 2021-06-15 VITALS — BP 132/60 | HR 67 | Temp 97.6°F | Resp 20 | Ht 62.0 in | Wt 177.2 lb

## 2021-06-15 DIAGNOSIS — M8580 Other specified disorders of bone density and structure, unspecified site: Secondary | ICD-10-CM | POA: Diagnosis not present

## 2021-06-15 DIAGNOSIS — M8589 Other specified disorders of bone density and structure, multiple sites: Secondary | ICD-10-CM

## 2021-06-15 MED ORDER — DENOSUMAB 60 MG/ML ~~LOC~~ SOSY
60.0000 mg | PREFILLED_SYRINGE | Freq: Once | SUBCUTANEOUS | Status: AC
  Administered 2021-06-15: 60 mg via SUBCUTANEOUS
  Filled 2021-06-15: qty 1

## 2021-06-15 NOTE — Patient Instructions (Signed)
Denosumab injection What is this medication? DENOSUMAB (den oh sue mab) slows bone breakdown. Prolia is used to treat osteoporosis in women after menopause and in men, and in people who are taking corticosteroids for 6 months or more. Xgeva is used to treat a high calcium level due to cancer and to prevent bone fractures and other bone problems caused by multiple myeloma or cancer bone metastases. Xgeva is also used to treat giant cell tumor of the bone. This medicine may be used for other purposes; ask your health care provider or pharmacist if you have questions. COMMON BRAND NAME(S): Prolia, XGEVA What should I tell my care team before I take this medication? They need to know if you have any of these conditions: dental disease having surgery or tooth extraction infection kidney disease low levels of calcium or Vitamin D in the blood malnutrition on hemodialysis skin conditions or sensitivity thyroid or parathyroid disease an unusual reaction to denosumab, other medicines, foods, dyes, or preservatives pregnant or trying to get pregnant breast-feeding How should I use this medication? This medicine is for injection under the skin. It is given by a health care professional in a hospital or clinic setting. A special MedGuide will be given to you before each treatment. Be sure to read this information carefully each time. For Prolia, talk to your pediatrician regarding the use of this medicine in children. Special care may be needed. For Xgeva, talk to your pediatrician regarding the use of this medicine in children. While this drug may be prescribed for children as young as 13 years for selected conditions, precautions do apply. Overdosage: If you think you have taken too much of this medicine contact a poison control center or emergency room at once. NOTE: This medicine is only for you. Do not share this medicine with others. What if I miss a dose? It is important not to miss your dose.  Call your doctor or health care professional if you are unable to keep an appointment. What may interact with this medication? Do not take this medicine with any of the following medications: other medicines containing denosumab This medicine may also interact with the following medications: medicines that lower your chance of fighting infection steroid medicines like prednisone or cortisone This list may not describe all possible interactions. Give your health care provider a list of all the medicines, herbs, non-prescription drugs, or dietary supplements you use. Also tell them if you smoke, drink alcohol, or use illegal drugs. Some items may interact with your medicine. What should I watch for while using this medication? Visit your doctor or health care professional for regular checks on your progress. Your doctor or health care professional may order blood tests and other tests to see how you are doing. Call your doctor or health care professional for advice if you get a fever, chills or sore throat, or other symptoms of a cold or flu. Do not treat yourself. This drug may decrease your body's ability to fight infection. Try to avoid being around people who are sick. You should make sure you get enough calcium and vitamin D while you are taking this medicine, unless your doctor tells you not to. Discuss the foods you eat and the vitamins you take with your health care professional. See your dentist regularly. Brush and floss your teeth as directed. Before you have any dental work done, tell your dentist you are receiving this medicine. Do not become pregnant while taking this medicine or for 5 months after   stopping it. Talk with your doctor or health care professional about your birth control options while taking this medicine. Women should inform their doctor if they wish to become pregnant or think they might be pregnant. There is a potential for serious side effects to an unborn child. Talk to  your health care professional or pharmacist for more information. What side effects may I notice from receiving this medication? Side effects that you should report to your doctor or health care professional as soon as possible: allergic reactions like skin rash, itching or hives, swelling of the face, lips, or tongue bone pain breathing problems dizziness jaw pain, especially after dental work redness, blistering, peeling of the skin signs and symptoms of infection like fever or chills; cough; sore throat; pain or trouble passing urine signs of low calcium like fast heartbeat, muscle cramps or muscle pain; pain, tingling, numbness in the hands or feet; seizures unusual bleeding or bruising unusually weak or tired Side effects that usually do not require medical attention (report to your doctor or health care professional if they continue or are bothersome): constipation diarrhea headache joint pain loss of appetite muscle pain runny nose tiredness upset stomach This list may not describe all possible side effects. Call your doctor for medical advice about side effects. You may report side effects to FDA at 1-800-FDA-1088. Where should I keep my medication? This medicine is only given in a clinic, doctor's office, or other health care setting and will not be stored at home. NOTE: This sheet is a summary. It may not cover all possible information. If you have questions about this medicine, talk to your doctor, pharmacist, or health care provider.  2022 Elsevier/Gold Standard (2018-01-30 16:10:44)

## 2021-08-04 ENCOUNTER — Other Ambulatory Visit: Payer: Self-pay | Admitting: Cardiology

## 2021-08-06 NOTE — Telephone Encounter (Signed)
Prescription refill request for Eliquis received. Indication: afib Last office visit:revankar 03/21/21 Scr:0.8 06/13/21 Age: 11f Weight:80.3kg

## 2021-08-13 DIAGNOSIS — E785 Hyperlipidemia, unspecified: Secondary | ICD-10-CM | POA: Diagnosis not present

## 2021-08-13 DIAGNOSIS — E039 Hypothyroidism, unspecified: Secondary | ICD-10-CM | POA: Diagnosis not present

## 2021-08-13 DIAGNOSIS — I480 Paroxysmal atrial fibrillation: Secondary | ICD-10-CM | POA: Diagnosis not present

## 2021-08-13 DIAGNOSIS — Z79899 Other long term (current) drug therapy: Secondary | ICD-10-CM | POA: Diagnosis not present

## 2021-08-13 DIAGNOSIS — Z0000 Encounter for general adult medical examination without abnormal findings: Secondary | ICD-10-CM | POA: Diagnosis not present

## 2021-08-13 DIAGNOSIS — I10 Essential (primary) hypertension: Secondary | ICD-10-CM | POA: Diagnosis not present

## 2021-08-13 DIAGNOSIS — Z7189 Other specified counseling: Secondary | ICD-10-CM | POA: Diagnosis not present

## 2021-08-13 DIAGNOSIS — Z1331 Encounter for screening for depression: Secondary | ICD-10-CM | POA: Diagnosis not present

## 2021-09-05 DIAGNOSIS — E785 Hyperlipidemia, unspecified: Secondary | ICD-10-CM | POA: Diagnosis not present

## 2021-09-05 DIAGNOSIS — I480 Paroxysmal atrial fibrillation: Secondary | ICD-10-CM | POA: Diagnosis not present

## 2021-09-05 DIAGNOSIS — M810 Age-related osteoporosis without current pathological fracture: Secondary | ICD-10-CM | POA: Diagnosis not present

## 2021-09-21 DIAGNOSIS — E870 Hyperosmolality and hypernatremia: Secondary | ICD-10-CM | POA: Diagnosis not present

## 2021-09-21 DIAGNOSIS — I700 Atherosclerosis of aorta: Secondary | ICD-10-CM | POA: Diagnosis not present

## 2021-09-21 DIAGNOSIS — R0789 Other chest pain: Secondary | ICD-10-CM | POA: Diagnosis not present

## 2021-09-21 DIAGNOSIS — J90 Pleural effusion, not elsewhere classified: Secondary | ICD-10-CM | POA: Diagnosis not present

## 2021-09-21 DIAGNOSIS — R0602 Shortness of breath: Secondary | ICD-10-CM | POA: Diagnosis not present

## 2021-09-21 DIAGNOSIS — Z8701 Personal history of pneumonia (recurrent): Secondary | ICD-10-CM | POA: Diagnosis not present

## 2021-09-21 DIAGNOSIS — C782 Secondary malignant neoplasm of pleura: Secondary | ICD-10-CM | POA: Diagnosis not present

## 2021-09-21 DIAGNOSIS — Z853 Personal history of malignant neoplasm of breast: Secondary | ICD-10-CM | POA: Diagnosis not present

## 2021-09-21 DIAGNOSIS — E039 Hypothyroidism, unspecified: Secondary | ICD-10-CM | POA: Diagnosis not present

## 2021-09-21 DIAGNOSIS — M910 Juvenile osteochondrosis of pelvis: Secondary | ICD-10-CM | POA: Diagnosis not present

## 2021-09-21 DIAGNOSIS — Z888 Allergy status to other drugs, medicaments and biological substances status: Secondary | ICD-10-CM | POA: Diagnosis not present

## 2021-09-21 DIAGNOSIS — Z20822 Contact with and (suspected) exposure to covid-19: Secondary | ICD-10-CM | POA: Diagnosis not present

## 2021-09-21 DIAGNOSIS — R918 Other nonspecific abnormal finding of lung field: Secondary | ICD-10-CM | POA: Diagnosis not present

## 2021-09-21 DIAGNOSIS — M810 Age-related osteoporosis without current pathological fracture: Secondary | ICD-10-CM | POA: Diagnosis not present

## 2021-09-21 DIAGNOSIS — I4891 Unspecified atrial fibrillation: Secondary | ICD-10-CM | POA: Diagnosis not present

## 2021-09-21 DIAGNOSIS — Z885 Allergy status to narcotic agent status: Secondary | ICD-10-CM | POA: Diagnosis not present

## 2021-09-21 DIAGNOSIS — J910 Malignant pleural effusion: Secondary | ICD-10-CM | POA: Diagnosis not present

## 2021-09-21 DIAGNOSIS — C50919 Malignant neoplasm of unspecified site of unspecified female breast: Secondary | ICD-10-CM | POA: Diagnosis not present

## 2021-09-21 DIAGNOSIS — J9819 Other pulmonary collapse: Secondary | ICD-10-CM | POA: Diagnosis not present

## 2021-09-21 DIAGNOSIS — Z79899 Other long term (current) drug therapy: Secondary | ICD-10-CM | POA: Diagnosis not present

## 2021-09-21 DIAGNOSIS — Z8719 Personal history of other diseases of the digestive system: Secondary | ICD-10-CM | POA: Diagnosis not present

## 2021-09-21 DIAGNOSIS — Z882 Allergy status to sulfonamides status: Secondary | ICD-10-CM | POA: Diagnosis not present

## 2021-09-21 DIAGNOSIS — I3139 Other pericardial effusion (noninflammatory): Secondary | ICD-10-CM | POA: Diagnosis not present

## 2021-09-21 DIAGNOSIS — Z66 Do not resuscitate: Secondary | ICD-10-CM | POA: Diagnosis not present

## 2021-09-22 DIAGNOSIS — J9819 Other pulmonary collapse: Secondary | ICD-10-CM | POA: Diagnosis not present

## 2021-09-22 DIAGNOSIS — J90 Pleural effusion, not elsewhere classified: Secondary | ICD-10-CM | POA: Diagnosis not present

## 2021-09-22 DIAGNOSIS — I3139 Other pericardial effusion (noninflammatory): Secondary | ICD-10-CM | POA: Diagnosis not present

## 2021-09-27 DIAGNOSIS — J910 Malignant pleural effusion: Secondary | ICD-10-CM | POA: Insufficient documentation

## 2021-09-27 HISTORY — DX: Malignant pleural effusion: J91.0

## 2021-10-05 NOTE — Progress Notes (Signed)
Carrie Wilkerson  [address] [place],  [place]  [address] [phone]  Clinic Day:  10/11/2021  Referring physician: Ernestene Kiel, MD  This document serves as a record of services personally performed by Hosie Poisson, MD. It was created on their behalf by Curry,Lauren E, a trained medical scribe. The creation of this record is based on the scribe's personal observations and the provider's statements to them.  ASSESSMENT & PLAN:   1. 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     2. Osteopenia, for which she has been receiving denosumab every 6 months, next due in March.  She continues calcium and vitamin-D as recommended.  She will be due for repeat bone density in September 2023.  3. 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 CT chest from December revealed right middle and lower lobe collapse with large right pleural effusion. No central obstructing mass lesion evident. No obvious right-sided pleural disease although there is a irregular focus of soft tissue attenuation along the medial right upper lobe pleura/right anterior mediastinum. Scattered tiny bilateral pulmonary nodules measuring up to 4 mm. Thoracentesis yielded 1200 cc's, and cytopathology confirmed malignant cells consistent with breast primary, estrogen receptor positive.  Unfortunately, she has been found to have recurrent disease after 15 years with malignant right sided pleural effusion. In view of this, we will schedule her for PET imaging and brain MRI to stage this. Treatment options were reviewed such as hormonal therapy with anastrazole and probably oral chemotherapy with palbociclib. I will go ahead and send in anastrozole 1 mg daily, but will wait until we receive the  imaging results to initiate oral chemotherapy. She is currently receiving denosumab every 6 months, but if she has evidence of osseous metastases, we would begin to administer this monthly. I have added a CEA and CA 27-29 for completeness. We will see her back in 3 weeks for repeat evaluation. The patient understands the plans discussed today and is in agreement with them.  She knows to contact our office if she develops concerns prior to her next visit.   I provided 35 minutes of face-to-face time during this this encounter and > 50% was spent counseling as documented under my assessment and plan.    Derwood Kaplan, MD [facility] [address] [place] [address] Dept: [phone] Dept Fax: [phone]    CHIEF COMPLAINT:  CC: History of stage IIIA hormone receptor positive left breast cancer  Current Treatment:  Surveillance, denosumab   HISTORY OF PRESENT ILLNESS:  Carrie Wilkerson is a 80 y.o. 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 She was treated with lumpectomy.  Pathology revealed a 1.5 cm, grade 1, invasive ductal carcinoma with 5 positive nodes.  Estrogen and progesterone receptors were positive and Her-2 Neu negative.  She received adjuvant chemotherapy with Adriamycin/Cytoxan for 3 months, followed by Taxol for 3 months.  She then received adjuvant radiation to the left breast.  She was placed on letrozole in April 2008 and completed 10 years of adjuvant hormonal therapy in April 2018.  Bone density scan in June 2015 revealed significant osteopenia in the forearm, with a T-score of  -2.3, despite being on  alendronate, so she was placed on Prolia (denosumab) 60 mg every 6 months.   Repeat bone density in September 2017 revealed slight improvement in the bone density, with a T-score of -2.1 in the forearm and a T-score of -1.1  in the femur with, so she has continued denosumab every 6 months, in addition to calcium/vitamin D.  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    She had reversal of her colostomy in October 2017.  She has an incisional hernia in the abdomen, which does not bother her, so she has decided against surgical repair.  She has atrial fibrillation and is on apixaban 5 mg twice daily.  Bone density scan in September 2019 revealed improvement in the bone density with a T-score of -1.6 in forearm.  The bone density of the femur had normalized with a T-score of -0.8.  She has continued denosumab every 6 months.  Annual bilateral mammogram in December 2020 did not reveal any evidence of malignancy.  She contracted COVID-19 later in December 2020.  She did not have to be hospitalized and fully recovered.  Bone density from September 2021 revealed mildly worsened osteopenia with a T-score of -2.0 of the left forearm radius, previously -1.6.  Left femur neck is normal at -0.9, previously -0.8.  She continues calcium 1200 mg with vitamin-D daily.  INTERVAL HISTORY:  I have reviewed her chart and materials related to her cancer extensively and collaborated history with the patient. Summary of oncologic history is as follows: Oncology History  Malignant neoplasm of upper-outer quadrant of left female breast ([facility])  03/07/2006 Initial Diagnosis   Breast cancer, left breast ([place])   03/17/2006 Cancer Staging   Staging form: Breast, AJCC 6th Edition - Clinical stage from 03/17/2006: Stage IIIA (T1c, N2a, M0) - Signed by Derwood Kaplan, MD on 12/15/2020 Staged by: Managing physician Diagnostic confirmation: Positive histology Specimen type: Excision Histopathologic type: Infiltrating duct carcinoma, NOS Tumor size (mm): 15 Laterality: Left Total positive nodes: 5 Histologic grade (G): G1 Residual tumor (R): R0 - None Stage prefix: Initial diagnosis Lymphatic vessel invasion (L): L0 - No  lymphatic vessel invasion Venous invasion (V): V0 - No venous invasion Prognostic indicators: ER/PR pos, HER 2 neg., Rx with AC x 3 mo, then Taxol x 3 mo.,AI x 10 yrs     Carrie Wilkerson is here for follow up after presenting to the emergency department on December 16th due to shortness of breath that had been ongoing for the past 3 weeks. She was also having some left substernal area pain. ECHO revealed a normal EF between 60-65%. CT chest revealed right middle and lower lobe collapse with large right pleural effusion. There was no central obstructing mass lesion evident, or obvious right-sided pleural disease, although there is a irregular focus of soft tissue attenuation along the medial right upper lobe pleura/right anterior mediastinum. There were scattered tiny bilateral pulmonary nodules measuring up to 4 mm. Thoracentesis was pursued yielding 1200 cc's of pleural fluid. Cytopathology confirmed malignant cells consistent with breast primary. CKAE1/3, GATA-3 and ER positive. She still reports shortness of breath with exertion, and feels it is worsening. She has pain of the right lower chest, beneath the right breast. She has been using Tylenol for her pain. Blood counts and chemistries are unremarkable. Her  appetite is poor, and she has lost 5 and 1/2 pounds since her last visit.  She denies fever, chills or other signs of infection.  She denies nausea, vomiting, bowel  issues, or abdominal pain.  She denies sore throat, cough, dyspnea, or chest pain.  HISTORY:   Allergies:  Allergies  Allergen Reactions   Codeine     unknown    Sulfa Antibiotics     unknown     Current Medications: Current Outpatient Medications  Medication Sig Dispense Refill   apixaban (ELIQUIS) 5 MG TABS tablet TAKE 1 TABLET BY MOUTH TWICE A DAY 180 tablet 1   atorvastatin (LIPITOR) 10 MG tablet Take 1 tablet by mouth every other day.  4   CALCIUM PO Take 1 tablet by mouth daily.     cholecalciferol (VITAMIN D) 1000 UNITS  tablet Take 1,000 Units by mouth daily.     denosumab (PROLIA) 60 MG/ML SOLN injection Inject 60 mg into the skin every 6 (six) months. Administer in upper arm, thigh, or abdomen     Flaxseed, Linseed, OIL Take 1 capsule by mouth daily.     glucosamine-chondroitin 500-400 MG tablet Take 1 tablet by mouth 2 (two) times daily.      levothyroxine (SYNTHROID, LEVOTHROID) 75 MCG tablet Take 75 mcg by mouth daily.   7   metoprolol tartrate (LOPRESSOR) 25 MG tablet Take 25 mg by mouth 2 (two) times daily.     Multiple Vitamin (MULTIVITAMIN WITH MINERALS) TABS Take 1 tablet by mouth daily.     No current facility-administered medications for this visit.    REVIEW OF SYSTEMS:  Review of Systems  Constitutional:  Positive for appetite change (poor) and unexpected weight change (5 and 1/2 pound weight loss). Negative for chills, fatigue and fever.  HENT:  Negative.    Eyes: Negative.   Respiratory:  Positive for shortness of breath (with exertion). Negative for chest tightness, cough, hemoptysis and wheezing.   Cardiovascular: Negative.  Negative for chest pain, leg swelling and palpitations.  Gastrointestinal: Negative.  Negative for abdominal distention, abdominal pain, blood in stool, constipation, diarrhea, nausea and vomiting.  Endocrine: Negative.   Genitourinary: Negative.  Negative for difficulty urinating, dysuria, frequency and hematuria.   Musculoskeletal:  Negative for arthralgias, back pain, flank pain, gait problem and myalgias.       Pain of the right lower chest, beneath the right breast  Skin: Negative.   Neurological: Negative.  Negative for dizziness, extremity weakness, gait problem, headaches, light-headedness, numbness, seizures and speech difficulty.  Hematological: Negative.   Psychiatric/Behavioral: Negative.  Negative for depression and sleep disturbance. The patient is not nervous/anxious.      VITALS:  Blood pressure (!) 149/70, pulse 84, temperature 98 F (36.7 C),  temperature source Oral, resp. rate 20, height 5' 2"  (1.575 m), weight 171 lb 6.4 oz (77.7 kg), SpO2 93 %.  Wt Readings from Last 3 Encounters:  10/11/21 171 lb 6.4 oz (77.7 kg)  06/15/21 177 lb 4 oz (80.4 kg)  06/13/21 177 lb (80.3 kg)    Body mass index is 31.35 kg/m.  Performance status (ECOG): 1 - Symptomatic but completely ambulatory  PHYSICAL EXAM:  Physical Exam Constitutional:      General: She is not in acute distress.    Appearance: Normal appearance. She is normal weight.  HENT:     Head: Normocephalic and atraumatic.  Eyes:     General: No scleral icterus.    Extraocular Movements: Extraocular movements intact.     Conjunctiva/sclera: Conjunctivae normal.     Pupils: Pupils are equal, round, and reactive to light.  Cardiovascular:     Rate and Rhythm: Normal rate and regular rhythm.  Pulses: Normal pulses.     Heart sounds: Normal heart sounds. No murmur heard.   No friction rub. No gallop.  Pulmonary:     Effort: Pulmonary effort is normal. No respiratory distress.     Breath sounds: Decreased breath sounds (at the right base) present.  Chest:     Comments: Well healed scar of the anterior left breast with some nipple inversion. Firm scar in the left axilla. Abdominal:     General: Bowel sounds are normal. There is distension (abdomen is distended and tympanitic but soft).     Palpations: Abdomen is soft. There is splenomegaly (mild). There is no hepatomegaly or mass.     Tenderness: There is no abdominal tenderness.     Comments: Large scar of the mid abdomen.   Musculoskeletal:        General: Normal range of motion.     Cervical back: Normal range of motion and neck supple.     Right lower leg: No edema.     Left lower leg: No edema.  Lymphadenopathy:     Cervical: No cervical adenopathy.  Skin:    General: Skin is warm and dry.  Neurological:     General: No focal deficit present.     Mental Status: She is alert and oriented to person, place, and  time. Mental status is at baseline.  Psychiatric:        Mood and Affect: Mood normal.        Behavior: Behavior normal.        Thought Content: Thought content normal.        Judgment: Judgment normal.   No masses in either breast.  LABS:   CBC Latest Ref Rng & Units 10/11/2021 06/13/2021 12/15/2020  WBC - 7.0 5.7 5.0  Hemoglobin 12.0 - 16.0 15.3 15.0 14.4  Hematocrit 36 - 46 44 45 43  Platelets 150 - 399 286 243 249   CMP Latest Ref Rng & Units 10/11/2021 06/13/2021 12/15/2020  Glucose 65 - 99 mg/dL - - -  BUN 4 - 21 12 15 16   Creatinine 0.5 - 1.1 0.6 0.8 0.6  Sodium 137 - 147 140 141 140  Potassium 3.4 - 5.3 4.1 3.9 4.3  Chloride 99 - 108 109(A) 108 108  CO2 13 - 22 23(A) 22 27(A)  Calcium 8.7 - 10.7 9.3 9.1 9.6  Total Protein 6.0 - 8.5 g/dL - - -  Total Bilirubin 0.0 - 1.2 mg/dL - - -  Alkaline Phos 25 - 125 56 50 44  AST 13 - 35 34 31 35  ALT 7 - 35 23 16 28     STUDIES:  No results found.   EXAM: 09/21/2021 ECHOCARDIOGRAM  CONCLUSION: 1. Overall left ventricular systolic function is normal with, an EF between 60 - 65 %.  2. The diastolic filling pattern is normal for the age of the patient.  3. The left atrium is normal in size.  4. There is trace mitral regurgitation.  5. Trace tricuspid regurgitation present.  6. There is a trivial pericardial effusion present. Right pleural effusion noted.  7. There is no evidence of cardiac tamponade.  8. Pleural effusion noted.   EXAM: 09/21/2021 CT CHEST WITH CONTRAST   TECHNIQUE:  Multidetector CT imaging of the chest was performed during  intravenous contrast administration.   CONTRAST: 80 cc Isovue 370   COMPARISON: None.   FINDINGS:  Cardiovascular: The heart size is normal. No substantial pericardial  effusion. Moderate atherosclerotic calcification is  noted in the  wall of the thoracic aorta.  Mediastinum/Nodes: Upper normal mediastinal lymph nodes evident. 2  cm irregular soft tissue nodule identified in the  right anterior  mediastinum along the pleural surface (image 48/2). No evidence for  hilar lymphadenopathy. The esophagus has normal imaging features. 15  mm right thyroid nodule evident. There is no axillary  lymphadenopathy.  Lungs/Pleura: Right middle and lower lobe collapse with large right  pleural effusion. No central obstructing mass lesion evident.  Subpleural reticulation anterior left upper lobe likely from prior  radiation therapy. 3 mm left upper lobe nodule visible on 59/4. 3 mm  left lower lobe nodule visible on 64/4. 3 mm left lower lobe nodule  on 71/4. Other scattered similar 3 in 4 mm pulmonary nodules  evident. No left pleural effusion.  Upper Abdomen: Exophytic cyst upper pole left kidney incompletely  visualized. Midline ventral hernia incompletely visualized.  Musculoskeletal: No worrisome lytic or sclerotic osseous  abnormality.   IMPRESSION:  1. Right middle and lower lobe collapse with large right pleural effusion. No central obstructing mass lesion evident. Continued  close follow-up recommended. PET-CT may prove helpful to further  evaluate if there is clinical concern for neoplasm.  2. No obvious right-sided pleural disease although there is a  irregular focus of soft tissue attenuation along the medial right  upper lobe pleura/right anterior mediastinum.  3. Scattered tiny bilateral pulmonary nodules measuring up to 4 mm.  No follow-up needed if patient is low-risk (and has no known or  suspected primary neoplasm). Non-contrast chest CT can be considered  in 12 months if patient is high-risk. This recommendation follows  the consensus statement: Guidelines for Management of Incidental  Pulmonary Nodules Detected on CT Images: From the Fleischner Society  2017; Radiology 2017; 284:228-243.  4. 15 mm right thyroid nodule. Recommend thyroid US (ref: J Am Coll  Radiol. 2015 Feb;12(2): 143-50).  5. Aortic Atherosclerosis (ICD10-I70.0).    CYTOPATHOLOGY:  09/21/2021 Right pleural effusion:  Malignant cells consistent with breast primary   I, Rita Ohara, am acting as scribe for Derwood Kaplan, MD  I have reviewed this report as typed by the medical scribe, and it is complete and accurate.

## 2021-10-11 ENCOUNTER — Other Ambulatory Visit: Payer: Self-pay

## 2021-10-11 ENCOUNTER — Telehealth: Payer: Self-pay | Admitting: Oncology

## 2021-10-11 ENCOUNTER — Inpatient Hospital Stay: Payer: Medicare HMO

## 2021-10-11 ENCOUNTER — Inpatient Hospital Stay: Payer: Medicare HMO | Attending: Oncology | Admitting: Oncology

## 2021-10-11 ENCOUNTER — Other Ambulatory Visit: Payer: Self-pay | Admitting: Hematology and Oncology

## 2021-10-11 ENCOUNTER — Other Ambulatory Visit: Payer: Self-pay | Admitting: Oncology

## 2021-10-11 ENCOUNTER — Encounter: Payer: Self-pay | Admitting: Oncology

## 2021-10-11 VITALS — BP 149/70 | HR 84 | Temp 98.0°F | Resp 20 | Ht 62.0 in | Wt 171.4 lb

## 2021-10-11 DIAGNOSIS — Z923 Personal history of irradiation: Secondary | ICD-10-CM | POA: Diagnosis not present

## 2021-10-11 DIAGNOSIS — Z8616 Personal history of COVID-19: Secondary | ICD-10-CM | POA: Diagnosis not present

## 2021-10-11 DIAGNOSIS — C50412 Malignant neoplasm of upper-outer quadrant of left female breast: Secondary | ICD-10-CM

## 2021-10-11 DIAGNOSIS — C50912 Malignant neoplasm of unspecified site of left female breast: Secondary | ICD-10-CM | POA: Insufficient documentation

## 2021-10-11 DIAGNOSIS — Z170 Estrogen receptor positive status [ER+]: Secondary | ICD-10-CM

## 2021-10-11 DIAGNOSIS — J910 Malignant pleural effusion: Secondary | ICD-10-CM

## 2021-10-11 DIAGNOSIS — M8589 Other specified disorders of bone density and structure, multiple sites: Secondary | ICD-10-CM | POA: Diagnosis not present

## 2021-10-11 DIAGNOSIS — Z9221 Personal history of antineoplastic chemotherapy: Secondary | ICD-10-CM | POA: Diagnosis not present

## 2021-10-11 DIAGNOSIS — Z853 Personal history of malignant neoplasm of breast: Secondary | ICD-10-CM | POA: Diagnosis not present

## 2021-10-11 LAB — CBC
MCV: 92 (ref 81–99)
RBC: 4.79 (ref 3.87–5.11)

## 2021-10-11 LAB — HEPATIC FUNCTION PANEL
ALT: 23 (ref 7–35)
AST: 34 (ref 13–35)
Alkaline Phosphatase: 56 (ref 25–125)
Bilirubin, Total: 0.5

## 2021-10-11 LAB — BASIC METABOLIC PANEL
BUN: 12 (ref 4–21)
CO2: 23 — AB (ref 13–22)
Chloride: 109 — AB (ref 99–108)
Creatinine: 0.6 (ref 0.5–1.1)
Glucose: 126
Potassium: 4.1 (ref 3.4–5.3)
Sodium: 140 (ref 137–147)

## 2021-10-11 LAB — CBC AND DIFFERENTIAL
HCT: 44 (ref 36–46)
Hemoglobin: 15.3 (ref 12.0–16.0)
Neutrophils Absolute: 4.2
Platelets: 286 (ref 150–399)
WBC: 7

## 2021-10-11 LAB — COMPREHENSIVE METABOLIC PANEL
Albumin: 4 (ref 3.5–5.0)
Calcium: 9.3 (ref 8.7–10.7)

## 2021-10-11 MED ORDER — ANASTROZOLE 1 MG PO TABS: 1.0000 mg | ORAL_TABLET | Freq: Every day | ORAL | 5 refills | Status: DC

## 2021-10-11 NOTE — Telephone Encounter (Signed)
Per 10/11/21 los next appt scheduled and given to patient

## 2021-10-12 LAB — CANCER ANTIGEN 27.29: CA 27.29: 123.6 U/mL — ABNORMAL HIGH (ref 0.0–38.6)

## 2021-10-12 LAB — CEA: CEA: 1.6 ng/mL (ref 0.0–4.7)

## 2021-10-15 DIAGNOSIS — R112 Nausea with vomiting, unspecified: Secondary | ICD-10-CM | POA: Diagnosis not present

## 2021-10-15 DIAGNOSIS — Z6831 Body mass index (BMI) 31.0-31.9, adult: Secondary | ICD-10-CM | POA: Diagnosis not present

## 2021-10-15 DIAGNOSIS — J90 Pleural effusion, not elsewhere classified: Secondary | ICD-10-CM | POA: Diagnosis not present

## 2021-10-15 DIAGNOSIS — Z09 Encounter for follow-up examination after completed treatment for conditions other than malignant neoplasm: Secondary | ICD-10-CM | POA: Diagnosis not present

## 2021-10-17 ENCOUNTER — Encounter: Payer: Self-pay | Admitting: Oncology

## 2021-10-19 DIAGNOSIS — Z1231 Encounter for screening mammogram for malignant neoplasm of breast: Secondary | ICD-10-CM | POA: Diagnosis not present

## 2021-10-19 DIAGNOSIS — G319 Degenerative disease of nervous system, unspecified: Secondary | ICD-10-CM | POA: Diagnosis not present

## 2021-10-19 DIAGNOSIS — C50412 Malignant neoplasm of upper-outer quadrant of left female breast: Secondary | ICD-10-CM | POA: Diagnosis not present

## 2021-10-19 DIAGNOSIS — Z170 Estrogen receptor positive status [ER+]: Secondary | ICD-10-CM | POA: Diagnosis not present

## 2021-10-22 ENCOUNTER — Telehealth: Payer: Self-pay

## 2021-10-22 NOTE — Telephone Encounter (Signed)
Patient notified

## 2021-10-22 NOTE — Telephone Encounter (Signed)
-----   Message from Derwood Kaplan, MD sent at 10/22/2021  1:47 PM EST ----- Regarding: call Tell her mammo is clear and MRI brain is clear

## 2021-10-23 ENCOUNTER — Other Ambulatory Visit: Payer: Self-pay

## 2021-10-23 DIAGNOSIS — C801 Malignant (primary) neoplasm, unspecified: Secondary | ICD-10-CM | POA: Insufficient documentation

## 2021-10-24 ENCOUNTER — Encounter: Payer: Self-pay | Admitting: Oncology

## 2021-10-25 ENCOUNTER — Other Ambulatory Visit: Payer: Self-pay

## 2021-10-25 ENCOUNTER — Encounter: Payer: Self-pay | Admitting: Cardiology

## 2021-10-25 ENCOUNTER — Ambulatory Visit: Payer: Medicare HMO | Admitting: Cardiology

## 2021-10-25 VITALS — BP 148/80 | HR 75 | Ht 62.0 in | Wt 169.2 lb

## 2021-10-25 DIAGNOSIS — C50412 Malignant neoplasm of upper-outer quadrant of left female breast: Secondary | ICD-10-CM

## 2021-10-25 DIAGNOSIS — E669 Obesity, unspecified: Secondary | ICD-10-CM | POA: Diagnosis not present

## 2021-10-25 DIAGNOSIS — I480 Paroxysmal atrial fibrillation: Secondary | ICD-10-CM

## 2021-10-25 DIAGNOSIS — E782 Mixed hyperlipidemia: Secondary | ICD-10-CM | POA: Diagnosis not present

## 2021-10-25 MED ORDER — METOPROLOL TARTRATE 25 MG PO TABS: 25.0000 mg | ORAL_TABLET | Freq: Two times a day (BID) | ORAL | 3 refills | Status: DC

## 2021-10-25 MED ORDER — APIXABAN 5 MG PO TABS: 5.0000 mg | ORAL_TABLET | Freq: Two times a day (BID) | ORAL | 3 refills | Status: DC

## 2021-10-25 NOTE — Progress Notes (Incomplete)
[facility]  [address] [place],  [place]  [address] [phone]  Clinic Day:  10/25/2021  Referring physician: Ernestene Kiel, MD  This document serves as a record of services personally performed by Hosie Poisson, MD. It was created on their behalf by Curry,Lauren E, a trained medical scribe. The creation of this record is based on the scribe's personal observations and the provider's statements to them.  ASSESSMENT & PLAN:   1. 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     2. Osteopenia, for which she has been receiving denosumab every 6 months, next due in March.  She continues calcium and vitamin-D as recommended.  She will be due for repeat bone density in September 2023.  3. 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 CT chest from December revealed right middle and lower lobe collapse with large right pleural effusion. No central obstructing mass lesion evident. No obvious right-sided pleural disease although there is a irregular focus of soft tissue attenuation along the medial right upper lobe pleura/right anterior mediastinum. Scattered tiny bilateral pulmonary nodules measuring up to 4 mm. Thoracentesis yielded 1200 cc's, and cytopathology confirmed malignant cells consistent with breast primary, estrogen receptor positive.  Unfortunately, she has been found to have recurrent disease after 15 years with malignant right sided pleural effusion. In view of this, we will schedule her for PET imaging and brain MRI to stage this. Treatment options were reviewed such as hormonal therapy with anastrazole and probably oral chemotherapy with palbociclib. I will go ahead and send in anastrozole 1 mg daily, but will wait until we receive the  imaging results to initiate oral chemotherapy. She is currently receiving denosumab every 6 months, but if she has evidence of osseous metastases, we would begin to administer this monthly. I have added a CEA and CA 27-29 for completeness. We will see her back in 3 weeks for repeat evaluation. The patient understands the plans discussed today and is in agreement with them.  She knows to contact our office if she develops concerns prior to her next visit.   I provided 35 minutes of face-to-face time during this this encounter and > 50% was spent counseling as documented under my assessment and plan.    Derwood Kaplan, MD [facility] [address] [place] [address] Dept: [phone] Dept Fax: [phone]    CHIEF COMPLAINT:  CC: History of stage IIIA hormone receptor positive left breast cancer  Current Treatment:  Surveillance, denosumab   HISTORY OF PRESENT ILLNESS:  Carrie Wilkerson is a 81 y.o. 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 She was treated with lumpectomy.  Pathology revealed a 1.5 cm, grade 1, invasive ductal carcinoma with 5 positive nodes.  Estrogen and progesterone receptors were positive and Her-2 Neu negative.  She received adjuvant chemotherapy with Adriamycin/Cytoxan for 3 months, followed by Taxol for 3 months.  She then received adjuvant radiation to the left breast.  She was placed on letrozole in April 2008 and completed 10 years of adjuvant hormonal therapy in April 2018.  Bone density scan in June 2015 revealed significant osteopenia in the forearm, with a T-score of  -2.3, despite being on  alendronate, so she was placed on Prolia (denosumab) 60 mg every 6 months.   Repeat bone density in September 2017 revealed slight improvement in the bone density, with a T-score of -2.1 in the forearm and a T-score of -1.1  in the femur with, so she has continued denosumab every 6 months, in addition to calcium/vitamin D.  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    She had reversal of her colostomy in October 2017.  She has an incisional hernia in the abdomen, which does not bother her, so she has decided against surgical repair.  She has atrial fibrillation and is on apixaban 5 mg twice daily.  Bone density scan in September 2019 revealed improvement in the bone density with a T-score of -1.6 in forearm.  The bone density of the femur had normalized with a T-score of -0.8.  She has continued denosumab every 6 months.  Annual bilateral mammogram in December 2020 did not reveal any evidence of malignancy.  She contracted COVID-19 later in December 2020.  She did not have to be hospitalized and fully recovered.  Bone density from September 2021 revealed mildly worsened osteopenia with a T-score of -2.0 of the left forearm radius, previously -1.6.  Left femur neck is normal at -0.9, previously -0.8.  She continues calcium 1200 mg with vitamin-D daily.  INTERVAL HISTORY:  I have reviewed her chart and materials related to her cancer extensively and collaborated history with the patient. Summary of oncologic history is as follows: Oncology History  Malignant neoplasm of upper-outer quadrant of left female breast ([facility])  03/07/2006 Initial Diagnosis   Breast cancer, left breast ([facility])   03/17/2006 Cancer Staging   Staging form: Breast, AJCC 6th Edition - Clinical stage from 03/17/2006: Stage IIIA (T1c, N2a, M0) - Signed by Derwood Kaplan, MD on 12/15/2020 Staged by: Managing physician Diagnostic confirmation: Positive histology Specimen type: Excision Histopathologic type: Infiltrating duct carcinoma, NOS Tumor size (mm): 15 Laterality: Left Total positive nodes: 5 Histologic grade (G): G1 Residual tumor (R): R0 - None Stage prefix: Initial diagnosis Lymphatic vessel invasion (L): L0 - No  lymphatic vessel invasion Venous invasion (V): V0 - No venous invasion Prognostic indicators: ER/PR pos, HER 2 neg., Rx with AC x 3 mo, then Taxol x 3 mo.,AI x 10 yrs    Carrie Wilkerson is here for follow up after presenting to the emergency department on December 16th due to shortness of breath that had been ongoing for the past 3 weeks. She was also having some left substernal area pain. ECHO revealed a normal EF between 60-65%. CT chest revealed right middle and lower lobe collapse with large right pleural effusion. There was no central obstructing mass lesion evident, or obvious right-sided pleural disease, although there is a irregular focus of soft tissue attenuation along the medial right upper lobe pleura/right anterior mediastinum. There were scattered tiny bilateral pulmonary nodules measuring up to 4 mm. Thoracentesis was pursued yielding 1200 cc's of pleural fluid. Cytopathology confirmed malignant cells consistent with breast primary. CKAE1/3, GATA-3 and ER positive. She still reports shortness of breath with exertion, and feels it is worsening. She has pain of the right lower chest, beneath the right breast. She has been using Tylenol for her pain. Blood counts and chemistries are unremarkable. Her  appetite is poor, and she has lost 5 and 1/2 pounds since her last visit.  She denies fever, chills or other signs of infection.  She denies nausea, vomiting, bowel issues,  or abdominal pain.  She denies sore throat, cough, dyspnea, or chest pain.  Lezlie is here for follow up ***. MRI brain from January 13th revealed no evidence of intracranial metastatic disease. Mild chronic small vessel ischemic changes within the cerebral white matter. Bilateral mammogram was clear.   Her  appetite is good, and she has gained/lost _ pounds since her last visit.  She denies fever, chills or other signs of infection.  She denies nausea, vomiting, bowel issues, or abdominal pain.  She denies sore throat, cough, dyspnea, or  chest pain.  HISTORY:   Allergies:  Allergies  Allergen Reactions   Codeine     unknown    Sulfa Antibiotics     unknown     Current Medications: Current Outpatient Medications  Medication Sig Dispense Refill   anastrozole (ARIMIDEX) 1 MG tablet Take 1 tablet (1 mg total) by mouth daily. 30 tablet 5   apixaban (ELIQUIS) 5 MG TABS tablet TAKE 1 TABLET BY MOUTH TWICE A DAY 180 tablet 1   atorvastatin (LIPITOR) 10 MG tablet Take 1 tablet by mouth every other day.  4   CALCIUM PO Take 1 tablet by mouth daily.     cholecalciferol (VITAMIN D) 1000 UNITS tablet Take 1,000 Units by mouth daily.     denosumab (PROLIA) 60 MG/ML SOLN injection Inject 60 mg into the skin every 6 (six) months. Administer in upper arm, thigh, or abdomen     Flaxseed, Linseed, OIL Take 1 capsule by mouth daily.     glucosamine-chondroitin 500-400 MG tablet Take 1 tablet by mouth 2 (two) times daily.      levothyroxine (SYNTHROID, LEVOTHROID) 75 MCG tablet Take 75 mcg by mouth daily.   7   metoprolol tartrate (LOPRESSOR) 25 MG tablet Take 25 mg by mouth 2 (two) times daily.     Multiple Vitamin (MULTIVITAMIN WITH MINERALS) TABS Take 1 tablet by mouth daily.     ondansetron (ZOFRAN-ODT) 4 MG disintegrating tablet Take 4 mg by mouth 2 (two) times daily as needed for nausea/vomiting.     No current facility-administered medications for this visit.    REVIEW OF SYSTEMS:  Review of Systems  Constitutional:  Positive for appetite change (poor) and unexpected weight change (5 and 1/2 pound weight loss). Negative for chills, fatigue and fever.  HENT:  Negative.    Eyes: Negative.   Respiratory:  Positive for shortness of breath (with exertion). Negative for chest tightness, cough, hemoptysis and wheezing.   Cardiovascular: Negative.  Negative for chest pain, leg swelling and palpitations.  Gastrointestinal: Negative.  Negative for abdominal distention, abdominal pain, blood in stool, constipation, diarrhea, nausea  and vomiting.  Endocrine: Negative.   Genitourinary: Negative.  Negative for difficulty urinating, dysuria, frequency and hematuria.   Musculoskeletal:  Negative for arthralgias, back pain, flank pain, gait problem and myalgias.       Pain of the right lower chest, beneath the right breast  Skin: Negative.   Neurological: Negative.  Negative for dizziness, extremity weakness, gait problem, headaches, light-headedness, numbness, seizures and speech difficulty.  Hematological: Negative.   Psychiatric/Behavioral: Negative.  Negative for depression and sleep disturbance. The patient is not nervous/anxious.      VITALS:  There were no vitals taken for this visit.  Wt Readings from Last 3 Encounters:  10/11/21 171 lb 6.4 oz (77.7 kg)  06/15/21 177 lb 4 oz (80.4 kg)  06/13/21 177 lb (80.3 kg)    There is no height or weight on file  to calculate BMI.  Performance status (ECOG): 1 - Symptomatic but completely ambulatory  PHYSICAL EXAM:  Physical Exam Constitutional:      General: She is not in acute distress.    Appearance: Normal appearance. She is normal weight.  HENT:     Head: Normocephalic and atraumatic.  Eyes:     General: No scleral icterus.    Extraocular Movements: Extraocular movements intact.     Conjunctiva/sclera: Conjunctivae normal.     Pupils: Pupils are equal, round, and reactive to light.  Cardiovascular:     Rate and Rhythm: Normal rate and regular rhythm.     Pulses: Normal pulses.     Heart sounds: Normal heart sounds. No murmur heard.   No friction rub. No gallop.  Pulmonary:     Effort: Pulmonary effort is normal. No respiratory distress.     Breath sounds: Decreased breath sounds (at the right base) present.  Chest:     Comments: Well healed scar of the anterior left breast with some nipple inversion. Firm scar in the left axilla. Abdominal:     General: Bowel sounds are normal. There is distension (abdomen is distended and tympanitic but soft).      Palpations: Abdomen is soft. There is splenomegaly (mild). There is no hepatomegaly or mass.     Tenderness: There is no abdominal tenderness.     Comments: Large scar of the mid abdomen.   Musculoskeletal:        General: Normal range of motion.     Cervical back: Normal range of motion and neck supple.     Right lower leg: No edema.     Left lower leg: No edema.  Lymphadenopathy:     Cervical: No cervical adenopathy.  Skin:    General: Skin is warm and dry.  Neurological:     General: No focal deficit present.     Mental Status: She is alert and oriented to person, place, and time. Mental status is at baseline.  Psychiatric:        Mood and Affect: Mood normal.        Behavior: Behavior normal.        Thought Content: Thought content normal.        Judgment: Judgment normal.   No masses in either breast.  LABS:   CBC Latest Ref Rng & Units 10/11/2021 06/13/2021 12/15/2020  WBC - 7.0 5.7 5.0  Hemoglobin 12.0 - 16.0 15.3 15.0 14.4  Hematocrit 36 - 46 44 45 43  Platelets 150 - 399 286 243 249   CMP Latest Ref Rng & Units 10/11/2021 06/13/2021 12/15/2020  Glucose 65 - 99 mg/dL - - -  BUN 4 - _0 Creatinine 0.5 - 1.1 0.6 0.8 0.6  Sodium 137 - 147 140 141 140  Potassium 3.4 - 5.3 4.1 3.9 4.3  Chloride 99 - 108 109(A) 108 108  CO2 13 - 22 23(A) 22 27(A)  Calcium 8.7 - 10.7 9.3 9.1 9.6  Total Protein 6.0 - 8.5 g/dL - - -  Total Bilirubin 0.0 - 1.2 mg/dL - - -  Alkaline Phos 25 - 125 56 50 44  AST 13 - 35 34 31 35  ALT 7 - 35 _1 STUDIES:  No results found.   EXAM: 10/19/2021 DIGITAL SCREENING BILATERAL MAMMOGRAM WITH TOMOSYNTHESIS AND CAD   TECHNIQUE:  Bilateral screening digital craniocaudal and mediolateral oblique  mammograms were obtained. Bilateral screening digital breast  tomosynthesis  was performed. The images were evaluated with  computer-aided detection.   COMPARISON: Previous exam(s).   ACR Breast Density Category b: There are scattered areas  of  fibroglandular density.   FINDINGS:  There are no findings suspicious for malignancy.   IMPRESSION:  No mammographic evidence of malignancy.     EXAM: 10/19/2021 MRI HEAD WITHOUT AND WITH CONTRAST   TECHNIQUE:  Multiplanar, multiecho pulse sequences of the brain and surrounding  structures were obtained without and with intravenous contrast.   CONTRAST: 7.5 mL Gadavist intravenous contrast.   COMPARISON: No pertinent prior exams available for comparison.   FINDINGS:  Brain:  Mild generalized cerebral atrophy.  Mild multifocal T2 FLAIR hyperintense signal abnormality within the  cerebral white matter, nonspecific but compatible chronic small  vessel ischemic disease.  Cavum septum pellucidum and cavum vergae.  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 This measures 1.7 x 2.6 cm in transaxial dimensions.  There is no acute infarct.  No evidence of an intracranial mass.  No chronic intracranial blood products.  No midline shift.  No pathologic intracranial enhancement identified.  Vascular: Maintained flow voids within the proximal large arterial  vessels.  Skull and upper cervical spine: No focal suspicious marrow lesion.  Sinuses/Orbits: Visualized orbits show no acute finding. Mild  mucosal thickening within the right maxillary sinus.   IMPRESSION:  No evidence of intracranial metastatic disease.  Mild chronic small vessel ischemic changes within the cerebral white  matter.  Mild generalized parenchymal atrophy.  Mild right maxillary sinus mucosal thickening.    I, Rita Ohara, am acting as scribe for Derwood Kaplan, MD  I have reviewed this report as typed by the medical scribe, and it is complete and accurate.

## 2021-10-25 NOTE — Patient Instructions (Signed)

## 2021-10-25 NOTE — Progress Notes (Signed)
Cardiology Office Note:    Date:  10/25/2021   ID:  Carrie Wilkerson, DOB [birth year], MRN [id number]  PCP:  Ernestene Kiel, MD  Cardiologist:  Jenean Lindau, MD   Referring MD: Ernestene Kiel, MD    ASSESSMENT:    1. Paroxysmal atrial fibrillation (HCC)   2. Malignant neoplasm of upper-outer quadrant of left female breast, unspecified estrogen receptor status ([facility])   3. Mixed dyslipidemia   4. Obesity (BMI 30.0-34.9)    PLAN:    In order of problems listed above:  Primary prevention stressed with patient.  Importance of compliance with diet medication stressed and she vocalized understanding. Paroxysmal atrial fibrillation:I discussed with the patient atrial fibrillation, disease process. Management and therapy including rate and rhythm control, anticoagulation benefits and potential risks were discussed extensively with the patient. Patient had multiple questions which were answered to patient's satisfaction. Mixed dyslipidemia: On statin therapy and managed by primary care.  Lipids reviewed. Obesity: Weight reduction stressed and diet was emphasized. Recent diagnosis of pleural effusion with malignancy per history given by patient.  She is followed by our oncology colleagues for this investigation. Patient will be seen in follow-up appointment in 6 months or earlier if the patient has any concerns    Medication Adjustments/Labs and Tests Ordered: Current medicines are reviewed at length with the patient today.  Concerns regarding medicines are outlined above.  No orders of the defined types were placed in this encounter.  No orders of the defined types were placed in this encounter.    No chief complaint on file.    History of Present Illness:    Carrie Wilkerson is a 81 y.o. female.  Patient has past medical history of paroxysmal fibrillation mixed dyslipidemia and obesity.  She has had history of breast cancer.  She tells me that she had  shortness of breath and went to the hospital and pleural fluid has revealed malignant cells so she has had recurrence of cancer.  She does not know the origin of the cancer.  She is planning to undergo PET scan soon.  At the time of my evaluation, the patient is alert awake oriented and in no distress.  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   History of left breast cancer 06/13/2021   Hypotension 06/13/2021   Incisional hernia, without obstruction or gangrene 10/22/2016   Malignant neoplasm of upper-outer quadrant of left female breast ([facility]) 03/07/2006   Mixed dyslipidemia 11/03/2018   Numbness in right leg    Hx: of   Obesity (BMI 30.0-34.9) 09/21/2020   Other specified disorders of bone density and structure, multiple sites 09/20/2020   Paroxysmal atrial fibrillation ([facility]) 02/13/2016   Personal history of COVID-19 09/2019   Pleural effusion, 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   Pneumonia    Hx: of 'a long time ago"   Postoperative examination 02/05/2016    Past Surgical History:  Procedure Laterality Date   APPENDECTOMY     BACK SURGERY     BREAST SURGERY     Hx: of lumpectomy left breast 2007   COLONOSCOPY     Hx: of   DILATION AND CURETTAGE OF UTERUS     TONSILLECTOMY     TOTAL HIP ARTHROPLASTY Right 04/13/2013   Dr Ninfa Linden  TOTAL HIP ARTHROPLASTY Right 04/13/2013   Procedure: RIGHT TOTAL HIP ARTHROPLASTY ANTERIOR APPROACH;  Surgeon: Mcarthur Rossetti, MD;  Location: [facility];  Service: Orthopedics;  Laterality: Right;   TUBAL LIGATION      Current Medications: Current Meds  Medication Sig   anastrozole (ARIMIDEX) 1 MG tablet Take 1 tablet (1 mg total) by mouth daily.   apixaban (ELIQUIS) 5 MG TABS tablet TAKE 1 TABLET BY MOUTH TWICE A DAY   atorvastatin (LIPITOR) 10 MG tablet Take 1 tablet by mouth every  other day.   CALCIUM PO Take 1 tablet by mouth daily.   cholecalciferol (VITAMIN D) 1000 UNITS tablet Take 1,000 Units by mouth daily.   denosumab (PROLIA) 60 MG/ML SOLN injection Inject 60 mg into the skin every 6 (six) months. Administer in upper arm, thigh, or abdomen   Flaxseed, Linseed, OIL Take 1 capsule by mouth daily.   glucosamine-chondroitin 500-400 MG tablet Take 1 tablet by mouth 2 (two) times daily.    levothyroxine (SYNTHROID, LEVOTHROID) 75 MCG tablet Take 75 mcg by mouth daily.    metoprolol tartrate (LOPRESSOR) 25 MG tablet Take 25 mg by mouth 2 (two) times daily.   Multiple Vitamin (MULTIVITAMIN WITH MINERALS) TABS Take 1 tablet by mouth daily.   ondansetron (ZOFRAN-ODT) 4 MG disintegrating tablet Take 4 mg by mouth 2 (two) times daily as needed for nausea/vomiting.     Allergies:   Codeine and Sulfa antibiotics   Social History   Socioeconomic History   Marital status: Married    Spouse name: Not on file   Number of children: Not on file   Years of education: Not on file   Highest education level: Not on file  Occupational History   Not on file  Tobacco Use   Smoking status: Never   Smokeless tobacco: Never  Vaping Use   Vaping Use: Never used  Substance and Sexual Activity   Alcohol use: No   Drug use: No   Sexual activity: Not on file  Other Topics Concern   Not on file  Social History Narrative   Not on file   Social Determinants of Health   Financial Resource Strain: Not on file  Food Insecurity: Not on file  Transportation Needs: Not on file  Physical Activity: Not on file  Stress: Not on file  Social Connections: Not on file     Family History: The patient's family history includes Cancer in an other family member; Hypertension in her brother and sister.  ROS:   Please see the history of present illness.    All other systems reviewed and are negative.  EKGs/Labs/Other Studies Reviewed:    The following studies were reviewed today: I  discussed my findings with the patient at length.   Recent Labs: 10/11/2021: ALT 23; BUN 12; Creatinine 0.6; Hemoglobin 15.3; Platelets 286; Potassium 4.1; Sodium 140  Recent Lipid Panel    Component Value Date/Time   CHOL 172 11/03/2018 0907   TRIG 217 (H) 11/03/2018 0907   HDL 43 11/03/2018 0907   CHOLHDL 4.0 11/03/2018 0907   LDLCALC 86 11/03/2018 0907    Physical Exam:    VS:  BP (!) 148/80    Pulse 75    Ht 5\' 2"  (1.575 m)    Wt 169 lb 3.2 oz (76.7 kg)    SpO2 94%    BMI 30.95 kg/m     Wt Readings from Last 3 Encounters:  10/25/21 169 lb 3.2 oz (76.7 kg)  10/11/21  171 lb 6.4 oz (77.7 kg)  06/15/21 177 lb 4 oz (80.4 kg)     GEN: Patient is in no acute distress HEENT: Normal NECK: No JVD; No carotid bruits LYMPHATICS: No lymphadenopathy CARDIAC: Hear sounds regular, 2/6 systolic murmur at the apex. RESPIRATORY:  Clear to auscultation without rales, wheezing or rhonchi  ABDOMEN: Soft, non-tender, non-distended MUSCULOSKELETAL:  No edema; No deformity  SKIN: Warm and dry NEUROLOGIC:  Alert and oriented x 3 PSYCHIATRIC:  Normal affect   Signed, Jenean Lindau, MD  10/25/2021 3:19 PM    [facility]

## 2021-10-26 ENCOUNTER — Telehealth: Payer: Self-pay | Admitting: Oncology

## 2021-10-26 NOTE — Telephone Encounter (Signed)
Patient has been notified of appt for Pet Scan and to see Dr.McCarty for the results.

## 2021-10-26 NOTE — Telephone Encounter (Signed)
Contacted pt to notify her of new Pet scan appt details and to R/S follow-up with Dr.McCarty. I was unable to reach her or the daughter via phone. I will attempt to contact a bit later.

## 2021-10-29 ENCOUNTER — Encounter: Payer: Self-pay | Admitting: Oncology

## 2021-10-30 NOTE — Progress Notes (Signed)
[facility]  [address] [employer],  [place]  [address] [phone]  Clinic Day:  11/05/2021  Referring physician: Ernestene Kiel, MD  This document serves as a record of services personally performed by Hosie Poisson, MD. It was created on their behalf by Curry,Lauren E, a trained medical scribe. The creation of this record is based on the scribe's personal observations and the provider's statements to them.  ASSESSMENT & PLAN:   1. 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     2. Osteopenia, for which she has been receiving denosumab every 6 months, next due in March.  She continues calcium and vitamin-D as recommended.  She will be due for repeat bone density in September 2023.  3. 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 CT chest from December revealed right middle and lower lobe collapse with large right pleural effusion. No central obstructing mass lesion evident. No obvious right-sided pleural disease although there is a irregular focus of soft tissue attenuation along the medial right upper lobe pleura/right anterior mediastinum. Scattered tiny bilateral pulmonary nodules measuring up to 4 mm. Thoracentesis yielded 1200 cc's, and cytopathology confirmed malignant cells consistent with breast primary, estrogen receptor positive. She has been placed on anastrozole and we discussed adding in oral chemotherapy with palbociclib.   MRI brain was negative for metastasis. PET imaging revealed no other evidence of metastatic disease. As the right pleural effusion occupies 75% of the right hemithorax, we will schedule her for thoracentesis this week. If she continues to require frequent thoracenteses, we could arrange for PleurX catheter. She  has already been placed on anastrozole 1 mg daily and has tolerated without difficulty. Additional treatment options were reviewed including oral chemotherapy with palbociclib daily for 21 days on and 7 days off. She is in agreement and so we will start with 125 mg and adjust her dose as needed. We will plan to see her back in 3 weeks with CBC and CMP for repeat evaluation. In view of her significant family history of malignancy, we will refer her to a genetic counselor for further discussion and potential testing. The patient understands the plans discussed today and is in agreement with them.  She knows to contact our office if she develops concerns prior to her next visit.   I provided 25 minutes of face-to-face time during this this encounter and > 50% was spent counseling as documented under my assessment and plan.    Derwood Kaplan, MD [facility] [address] 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 She was treated with lumpectomy.  Pathology revealed a 1.5 cm, grade 1, invasive ductal carcinoma with 5 positive nodes.  Estrogen and progesterone receptors were positive and Her-2 Neu negative.  She received adjuvant chemotherapy with Adriamycin/Cytoxan for 3 months, followed by Taxol for 3 months.  She then received adjuvant radiation to the left breast.  She was placed on letrozole in April 2008 and  completed 10 years of adjuvant hormonal therapy in April 2018.  Bone density scan in June 2015 revealed significant osteopenia in the forearm, with a T-score of  -2.3, despite being on  alendronate, so she was placed on Prolia (denosumab) 60 mg every 6 months.   Repeat bone density in September 2017 revealed slight improvement in the bone density, with a T-score of -2.1 in the forearm and a T-score of -1.1 in the femur with, so she has continued denosumab every 6 months, in addition to calcium/vitamin D.  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    She had reversal of her colostomy in October 2017.  She has an incisional hernia in the abdomen, which does not bother her, so she has decided against surgical repair.  She has atrial fibrillation and is on apixaban 5 mg twice daily.  Bone density scan in September 2019 revealed improvement in the bone density with a T-score of -1.6 in forearm.  The bone density of the femur had normalized with a T-score of -0.8.  She has continued denosumab every 6 months.  Annual bilateral mammogram in December 2020 did not reveal any evidence of malignancy.  She contracted COVID-19 later in December 2020.  She did not have to be hospitalized and fully recovered.  Bone density from September 2021 revealed mildly worsened osteopenia with a T-score of -2.0 of the left forearm radius, previously -1.6.  Left femur neck is normal at -0.9, previously -0.8.  She continues calcium 1200 mg with vitamin-D daily.  She presented to the emergency department in December 2022 due to shortness of breath that had been ongoing for the past 3 weeks. She was also having some left substernal area pain. ECHO revealed a normal EF between 60-65%. CT chest revealed right middle and lower lobe collapse with large right pleural effusion. There was no central obstructing mass lesion evident, or obvious right-sided pleural disease, although there is a irregular focus of soft tissue attenuation along the medial right upper lobe pleura/right anterior mediastinum. There were scattered tiny bilateral pulmonary nodules measuring up to 4 mm. Thoracentesis was pursued  yielding 1200 cc's of pleural fluid. Cytopathology confirmed malignant cells consistent with breast primary. CKAE1/3, GATA-3 and ER positive. HER2 was negative 1+. Estrogen receptor was positive at 90% and progesterone receptor was positive at 20%. Ki67 was <1%.   INTERVAL HISTORY:  I have reviewed her chart and materials related to her cancer extensively and collaborated history with the patient. Summary of oncologic history is as follows: Oncology History  Malignant neoplasm of upper-outer quadrant of left female breast ([facility])  03/07/2006 Initial Diagnosis   Breast cancer, left breast ([facility])   03/17/2006 Cancer Staging   Staging form: Breast, AJCC 6th Edition - Clinical stage from 03/17/2006: Stage IIIA (T1c, N2a, M0) - Signed by Derwood Kaplan, MD on 12/15/2020 Staged by: Managing physician Diagnostic confirmation: Positive histology Specimen type: Excision Histopathologic type: Infiltrating duct carcinoma, NOS Tumor size (mm): 15 Laterality: Left Total positive nodes: 5 Histologic grade (G): G1 Residual tumor (R): R0 - None Stage prefix: Initial diagnosis Lymphatic vessel invasion (L): L0 - No lymphatic vessel invasion Venous invasion (V): V0 - No venous invasion Prognostic indicators: ER/PR pos, HER 2 neg., Rx with AC x 3 mo, then Taxol x 3 mo.,AI x 10 yrs     Carrie Wilkerson is here for routine follow up and continues to report dyspnea with exertion, which is stable, but otherwise denies complaints. Her oxygen saturation  dropped as low as 88% when she walked to the exam room. MRI brain was negative for evidence of metastasis. PET imaging from January 26th revealed no hypermetabolic activity to localize active metastatic breast carcinoma. Large right pleural effusion occupies 75% of the right hemithorax. Along the anteromedial margin of the right diaphragm there is hypermetabolic linear thickening of the diaphragm. We will arrange for thoracentesis this week. Her  appetite is fair, and she has  lost another 3 and 1/2 pounds since her last visit.  She denies fever, chills or other signs of infection.  She denies nausea, vomiting, bowel issues, or abdominal pain.  She denies sore throat, cough or chest pain. She is accompanied by her daughter Magda Paganini today.  HISTORY:   Allergies:  Allergies  Allergen Reactions   Codeine     unknown    Sulfa Antibiotics     unknown     Current Medications: Current Outpatient Medications  Medication Sig Dispense Refill   anastrozole (ARIMIDEX) 1 MG tablet Take 1 tablet (1 mg total) by mouth daily. 30 tablet 5   apixaban (ELIQUIS) 5 MG TABS tablet Take 1 tablet (5 mg total) by mouth 2 (two) times daily. 180 tablet 3   atorvastatin (LIPITOR) 10 MG tablet Take 1 tablet by mouth every other day.  4   CALCIUM PO Take 1 tablet by mouth daily.     cholecalciferol (VITAMIN D) 1000 UNITS tablet Take 1,000 Units by mouth daily.     denosumab (PROLIA) 60 MG/ML SOLN injection Inject 60 mg into the skin every 6 (six) months. Administer in upper arm, thigh, or abdomen     Flaxseed, Linseed, OIL Take 1 capsule by mouth daily.     glucosamine-chondroitin 500-400 MG tablet Take 1 tablet by mouth 2 (two) times daily.      levothyroxine (SYNTHROID, LEVOTHROID) 75 MCG tablet Take 75 mcg by mouth daily.   7   metoprolol tartrate (LOPRESSOR) 25 MG tablet Take 1 tablet (25 mg total) by mouth 2 (two) times daily. 180 tablet 3   Multiple Vitamin (MULTIVITAMIN WITH MINERALS) TABS Take 1 tablet by mouth daily.     ondansetron (ZOFRAN-ODT) 4 MG disintegrating tablet Take 4 mg by mouth 2 (two) times daily as needed for nausea/vomiting.     No current facility-administered medications for this visit.    REVIEW OF SYSTEMS:  Review of Systems  Constitutional:  Negative for appetite change, chills, fatigue, fever and unexpected weight change.  HENT:  Negative.    Eyes: Negative.   Respiratory:  Positive for shortness of breath (with exertion). Negative for chest  tightness, cough, hemoptysis and wheezing.   Cardiovascular: Negative.  Negative for chest pain, leg swelling and palpitations.  Gastrointestinal: Negative.  Negative for abdominal distention, abdominal pain, blood in stool, constipation, diarrhea, nausea and vomiting.  Endocrine: Negative.   Genitourinary: Negative.  Negative for difficulty urinating, dysuria, frequency and hematuria.   Musculoskeletal: Negative.  Negative for arthralgias, back pain, flank pain, gait problem and myalgias.  Skin: Negative.   Neurological: Negative.  Negative for dizziness, extremity weakness, gait problem, headaches, light-headedness, numbness, seizures and speech difficulty.  Hematological: Negative.   Psychiatric/Behavioral: Negative.  Negative for depression and sleep disturbance. The patient is not nervous/anxious.      VITALS:  Blood pressure (!) 142/67, pulse 81, temperature 98.3 F (36.8 C), temperature source Oral, resp. rate (!) 22, height 5' 2"  (1.575 m), weight 168 lb 1.6 oz (76.2 kg), SpO2 93 %.  Wt Readings from  Last 3 Encounters:  11/05/21 168 lb 1.6 oz (76.2 kg)  10/25/21 169 lb 3.2 oz (76.7 kg)  10/11/21 171 lb 6.4 oz (77.7 kg)    Body mass index is 30.75 kg/m.  Performance status (ECOG): 1 - Symptomatic but completely ambulatory  PHYSICAL EXAM:  Physical Exam Constitutional:      General: She is not in acute distress.    Appearance: Normal appearance. She is normal weight.  HENT:     Head: Normocephalic and atraumatic.  Eyes:     General: No scleral icterus.    Extraocular Movements: Extraocular movements intact.     Conjunctiva/sclera: Conjunctivae normal.     Pupils: Pupils are equal, round, and reactive to light.  Cardiovascular:     Rate and Rhythm: Normal rate and regular rhythm.     Pulses: Normal pulses.     Heart sounds: Normal heart sounds. No murmur heard.   No friction rub. No gallop.  Pulmonary:     Effort: Pulmonary effort is normal. No respiratory distress.      Breath sounds: Decreased breath sounds (about halfway up on the right) and rales (a few of the right mid lung) present.  Abdominal:     General: Bowel sounds are normal. There is no distension.     Palpations: Abdomen is soft. There is no hepatomegaly, splenomegaly or mass.     Tenderness: There is no abdominal tenderness.  Musculoskeletal:        General: Normal range of motion.     Cervical back: Normal range of motion and neck supple.     Right lower leg: No edema.     Left lower leg: No edema.  Lymphadenopathy:     Cervical: No cervical adenopathy.  Skin:    General: Skin is warm and dry.  Neurological:     General: No focal deficit present.     Mental Status: She is alert and oriented to person, place, and time. Mental status is at baseline.  Psychiatric:        Mood and Affect: Mood normal.        Behavior: Behavior normal.        Thought Content: Thought content normal.        Judgment: Judgment normal.     LABS:   CBC Latest Ref Rng & Units 10/11/2021 06/13/2021 12/15/2020  WBC - 7.0 5.7 5.0  Hemoglobin 12.0 - 16.0 15.3 15.0 14.4  Hematocrit 36 - 46 44 45 43  Platelets 150 - 399 286 243 249   CMP Latest Ref Rng & Units 10/11/2021 06/13/2021 12/15/2020  Glucose 65 - 99 mg/dL - - -  BUN 4 - 21 12 15 16   Creatinine 0.5 - 1.1 0.6 0.8 0.6  Sodium 137 - 147 140 141 140  Potassium 3.4 - 5.3 4.1 3.9 4.3  Chloride 99 - 108 109(A) 108 108  CO2 13 - 22 23(A) 22 27(A)  Calcium 8.7 - 10.7 9.3 9.1 9.6  Total Protein 6.0 - 8.5 g/dL - - -  Total Bilirubin 0.0 - 1.2 mg/dL - - -  Alkaline Phos 25 - 125 56 50 44  AST 13 - 35 34 31 35  ALT 7 - 35 23 16 28     STUDIES:  No results found.    EXAM: 10/19/2020 MRI HEAD WITHOUT AND WITH CONTRAST   TECHNIQUE:  Multiplanar, multiecho pulse sequences of the brain and surrounding  structures were obtained without and with intravenous contrast.   CONTRAST: 7.5 mL  Gadavist intravenous contrast.   COMPARISON: No pertinent prior exams  available for comparison.  FINDINGS:  Brain:  Mild generalized cerebral atrophy.  Mild multifocal T2 FLAIR hyperintense signal abnormality within the  cerebral white matter, nonspecific but compatible chronic small  vessel ischemic disease.  Cavum septum pellucidum and cavum vergae.  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 This measures 1.7 x 2.6 cm in transaxial dimensions.  There is no acute infarct.  No evidence of an intracranial mass.  No chronic intracranial blood products.  No midline shift.  No pathologic intracranial enhancement identified.  Vascular: Maintained flow voids within the proximal large arterial  vessels.  Skull and upper cervical spine: No focal suspicious marrow lesion.  Sinuses/Orbits: Visualized orbits show no acute finding. Mild  mucosal thickening within the right maxillary sinus.   IMPRESSION:  No evidence of intracranial metastatic disease.  Mild chronic small vessel ischemic changes within the cerebral white  matter.  Mild generalized parenchymal atrophy.  Mild right maxillary sinus mucosal thickening.                           EXAM: 11/01/2021 NUCLEAR MEDICINE PET SKULL BASE TO THIGH   TECHNIQUE:  12.0 mCi F-18 FDG was injected intravenously. Full-ring PET imaging  was performed from the skull base to thigh after the radiotracer. CT  data was obtained and used for attenuation correction and anatomic  localization.   Fasting blood glucose: 105 mg/dl   COMPARISON:  None.   FINDINGS:  Mediastinal blood pool activity: SUV max 105   Liver activity: SUV max NA   NECK: No hypermetabolic lymph nodes in the neck.   Incidental CT findings: none   CHEST: No hypermetabolic mediastinal or hilar nodes. No suspicious  pulmonary nodules on the CT scan.   Large layering RIGHT pleural effusion occupies 75% of the RIGHT  hemithorax. There is  compressive atelectasis of the RIGHT lower lobe  and RIGHT middle lobe. No hypermetabolic pleural nodularity.   Along the anteromedial margin of the RIGHT diaphragm (image 55)  there is hypermetabolic linear thickening of the diaphragm. This is  favored benign physiologic muscular activity.   Incidental CT findings: none   ABDOMEN/PELVIS: No abnormal hypermetabolic activity within the  liver, pancreas, adrenal glands, or spleen. No hypermetabolic lymph  nodes in the abdomen or pelvis..   Incidental CT findings: Laxity of the midline abdominal wall.  Leiomyomas uterus. No adnexal lesion. Cyst of LEFT kidney.   SKELETON: No focal hypermetabolic activity to suggest skeletal  metastasis.   Incidental CT findings: RIGHT hip prosthetic   IMPRESSION:  1. No hypermetabolic activity to localize active metastatic breast  carcinoma.  2. Large RIGHT pleural effusion occupies 75% of the RIGHT  hemithorax.     I, Rita Ohara, am acting as scribe for Derwood Kaplan, MD  I have reviewed this report as typed by the medical scribe, and it is complete and accurate.

## 2021-10-31 ENCOUNTER — Ambulatory Visit: Payer: Medicare HMO | Admitting: Oncology

## 2021-11-01 DIAGNOSIS — C50919 Malignant neoplasm of unspecified site of unspecified female breast: Secondary | ICD-10-CM | POA: Diagnosis not present

## 2021-11-01 DIAGNOSIS — R7309 Other abnormal glucose: Secondary | ICD-10-CM | POA: Diagnosis not present

## 2021-11-01 DIAGNOSIS — J90 Pleural effusion, not elsewhere classified: Secondary | ICD-10-CM | POA: Diagnosis not present

## 2021-11-04 DIAGNOSIS — E785 Hyperlipidemia, unspecified: Secondary | ICD-10-CM | POA: Diagnosis not present

## 2021-11-04 DIAGNOSIS — M810 Age-related osteoporosis without current pathological fracture: Secondary | ICD-10-CM | POA: Diagnosis not present

## 2021-11-04 DIAGNOSIS — I480 Paroxysmal atrial fibrillation: Secondary | ICD-10-CM | POA: Diagnosis not present

## 2021-11-05 ENCOUNTER — Other Ambulatory Visit: Payer: Self-pay | Admitting: Oncology

## 2021-11-05 ENCOUNTER — Inpatient Hospital Stay: Payer: Medicare HMO | Admitting: Oncology

## 2021-11-05 ENCOUNTER — Other Ambulatory Visit: Payer: Self-pay | Admitting: Hematology and Oncology

## 2021-11-05 ENCOUNTER — Encounter: Payer: Self-pay | Admitting: Oncology

## 2021-11-05 VITALS — BP 142/67 | HR 81 | Temp 98.3°F | Resp 22 | Ht 62.0 in | Wt 168.1 lb

## 2021-11-05 DIAGNOSIS — J910 Malignant pleural effusion: Secondary | ICD-10-CM

## 2021-11-05 DIAGNOSIS — Z170 Estrogen receptor positive status [ER+]: Secondary | ICD-10-CM

## 2021-11-05 DIAGNOSIS — C50412 Malignant neoplasm of upper-outer quadrant of left female breast: Secondary | ICD-10-CM

## 2021-11-06 ENCOUNTER — Telehealth: Payer: Self-pay | Admitting: Oncology

## 2021-11-06 ENCOUNTER — Telehealth: Payer: Self-pay | Admitting: Hematology and Oncology

## 2021-11-06 ENCOUNTER — Telehealth: Payer: Self-pay

## 2021-11-06 ENCOUNTER — Other Ambulatory Visit: Payer: Self-pay

## 2021-11-06 DIAGNOSIS — Z170 Estrogen receptor positive status [ER+]: Secondary | ICD-10-CM

## 2021-11-06 DIAGNOSIS — C801 Malignant (primary) neoplasm, unspecified: Secondary | ICD-10-CM | POA: Diagnosis not present

## 2021-11-06 DIAGNOSIS — C50912 Malignant neoplasm of unspecified site of left female breast: Secondary | ICD-10-CM

## 2021-11-06 DIAGNOSIS — J984 Other disorders of lung: Secondary | ICD-10-CM | POA: Diagnosis not present

## 2021-11-06 DIAGNOSIS — C50412 Malignant neoplasm of upper-outer quadrant of left female breast: Secondary | ICD-10-CM

## 2021-11-06 DIAGNOSIS — J910 Malignant pleural effusion: Secondary | ICD-10-CM

## 2021-11-06 NOTE — Telephone Encounter (Signed)
Patient has been scheduled for Thoracentesis, for today at 1pm ; check in at 12:30.  Patient is aware of appt.

## 2021-11-06 NOTE — Telephone Encounter (Signed)
Referral to genetics entered into the computer.

## 2021-11-06 NOTE — Telephone Encounter (Signed)
-----  Message from Derwood Kaplan, MD sent at 11/05/2021  8:05 PM EST ----- Regarding: genetics Very positive family hx but we had decided in past that she didn't fit criteria.  However some new info and a niece that they think tested pos for BRCA.  I asked daughter to gather as much info as poss and I think she would be worthwhile for genetics consultation.

## 2021-11-06 NOTE — Telephone Encounter (Signed)
11/06/21 Spoke with patient and scheduled next appt

## 2021-11-09 ENCOUNTER — Other Ambulatory Visit: Payer: Self-pay

## 2021-11-09 ENCOUNTER — Inpatient Hospital Stay: Payer: Medicare HMO | Attending: Oncology | Admitting: Genetic Counselor

## 2021-11-09 ENCOUNTER — Encounter: Payer: Self-pay | Admitting: Genetic Counselor

## 2021-11-09 ENCOUNTER — Inpatient Hospital Stay: Payer: Medicare HMO

## 2021-11-09 ENCOUNTER — Other Ambulatory Visit: Payer: Self-pay | Admitting: Genetic Counselor

## 2021-11-09 DIAGNOSIS — Z170 Estrogen receptor positive status [ER+]: Secondary | ICD-10-CM

## 2021-11-09 DIAGNOSIS — Z803 Family history of malignant neoplasm of breast: Secondary | ICD-10-CM | POA: Diagnosis not present

## 2021-11-09 DIAGNOSIS — Z8049 Family history of malignant neoplasm of other genital organs: Secondary | ICD-10-CM | POA: Diagnosis not present

## 2021-11-09 DIAGNOSIS — C50412 Malignant neoplasm of upper-outer quadrant of left female breast: Secondary | ICD-10-CM

## 2021-11-09 DIAGNOSIS — Z800 Family history of malignant neoplasm of digestive organs: Secondary | ICD-10-CM | POA: Diagnosis not present

## 2021-11-09 DIAGNOSIS — J910 Malignant pleural effusion: Secondary | ICD-10-CM | POA: Diagnosis not present

## 2021-11-09 DIAGNOSIS — Z853 Personal history of malignant neoplasm of breast: Secondary | ICD-10-CM

## 2021-11-09 HISTORY — DX: Family history of malignant neoplasm of breast: Z80.3

## 2021-11-09 HISTORY — DX: Family history of malignant neoplasm of digestive organs: Z80.0

## 2021-11-09 HISTORY — DX: Family history of malignant neoplasm of other genital organs: Z80.49

## 2021-11-09 NOTE — Progress Notes (Signed)
REFERRING PROVIDER: Derwood Kaplan, MD [place] [place],  [place] [address]  PRIMARY PROVIDER:  Ernestene Kiel, MD  PRIMARY REASON FOR VISIT:  1. Malignant neoplasm of upper-outer quadrant of left breast in female, estrogen receptor positive ([place])   2. Family history of breast cancer   3. Family history of malignant neoplasm of digestive organ   4. Family history of malignant neoplasm of other genital organs     HISTORY OF PRESENT ILLNESS:   Ms. Laursen, a 81 y.o. female, was seen for a [facility] cancer genetics consultation at the request of Dr. Hinton Rao due to a personal and family history of cancer.  Ms. Godby presents to clinic today to discuss the possibility of a hereditary predisposition to cancer, to discuss genetic testing, and to further clarify her future cancer risks, as well as potential cancer risks for family members.   In June 2007, at the age of 82, Ms. Dlugosz was diagnosed with left breast cancer (ER+).  The treatment plan included surgery, chemotherapy, adjuvant radiation, and anti-estrogens.  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   CANCER HISTORY:  Oncology History  Malignant neoplasm of upper-outer quadrant of left female breast ([place])  03/07/2006 Initial Diagnosis   Breast cancer, left breast ([place])   03/17/2006 Cancer Staging   Staging form: Breast, AJCC 6th Edition - Clinical stage from 03/17/2006: Stage IIIA (T1c, N2a, M0) - Signed by Derwood Kaplan, MD on 12/15/2020 Staged by: Managing physician Diagnostic confirmation: Positive histology Specimen type: Excision Histopathologic type: Infiltrating duct carcinoma, NOS Tumor size (mm): 15 Laterality: Left Total positive nodes: 5 Histologic grade (G): G1 Residual tumor (R): R0 - None Stage prefix: Initial diagnosis Lymphatic vessel invasion (L): L0 - No lymphatic vessel invasion Venous invasion (V): V0 - No venous invasion Prognostic indicators:  ER/PR pos, HER 2 neg., Rx with AC x 3 mo, then Taxol x 3 mo.,AI x 10 yrs      RISK FACTORS:  Menarche was at age 78.  First live birth at age 54.  OCP use for approximately 0 years.  Ovaries intact: yes.  Hysterectomy: no.  Menopausal status: postmenopausal.  HRT use: previous use for unknown number of years Colonoscopy: yes;  no reported hx of polyps . Mammogram within the last year: yes.   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   Breast cancer, left breast ([place]) 03/07/2006   Cancer ([place])    Hx: of breast cancer in 2007   Degenerative arthritis of hip 04/13/2013   Family history of breast cancer 11/09/2021   Family history of malignant neoplasm of digestive organ 11/09/2021   Family history of malignant neoplasm of other genital organs 11/09/2021   History of left breast cancer 06/13/2021   Hypotension 06/13/2021   Incisional hernia, without obstruction or gangrene 10/22/2016   Malignant neoplasm of upper-outer quadrant of left female breast ([place]) 03/07/2006   Mixed dyslipidemia 11/03/2018   Numbness in right leg    Hx: of   Obesity (BMI 30.0-34.9) 09/21/2020   Other specified disorders of bone density and structure, multiple sites 09/20/2020   Paroxysmal atrial fibrillation ([place]) 02/13/2016   Personal history of COVID-19 09/2019   Pleural effusion, 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   Pneumonia    Hx: of 'a long time ago"   Postoperative examination 02/05/2016    Past Surgical History:  Procedure Laterality Date   APPENDECTOMY  BACK SURGERY     BREAST SURGERY     Hx: of lumpectomy left breast 2007   COLONOSCOPY     Hx: of   DILATION AND CURETTAGE OF UTERUS     TONSILLECTOMY     TOTAL HIP ARTHROPLASTY Right 04/13/2013   Dr Ninfa Linden   TOTAL HIP ARTHROPLASTY Right 04/13/2013   Procedure: RIGHT TOTAL HIP ARTHROPLASTY ANTERIOR APPROACH;  Surgeon: Mcarthur Rossetti, MD;  Location: [facility];  Service: Orthopedics;  Laterality: Right;   TUBAL  LIGATION      FAMILY HISTORY:  We obtained a detailed, 4-generation family history.  Significant diagnoses are listed below: Family History  Problem Relation Age of Onset   Cancer Sister 32       uterine or ovarian   Bone cancer Sister 39   Breast cancer Sister        dx 31 and 63   Uterine cancer Sister 40   Colon cancer Sister 57   Breast cancer Sister 26   Kidney cancer Brother 28   Cancer Brother 3       unknown type; mets   Liver cancer Brother    Liver cancer Brother 41   Pancreatic cancer Niece 66   Breast cancer Niece 62   Colon cancer Nephew 64       mets    Ms. Clements is unaware of previous family history of genetic testing for hereditary cancer risks. There is no reported Ashkenazi Jewish ancestry. There is no known consanguinity.  GENETIC COUNSELING ASSESSMENT: Ms. Pongratz is a 81 y.o. female with a personal and family history of cancer which is somewhat suggestive of a hereditary cancer syndrome and predisposition to cancer given the presence of related cancers in the family. We, therefore, discussed and recommended the following at today's visit.   DISCUSSION: We discussed that 5 - 10% of cancer is hereditary.  Most cases of hereditary breast cancer associated with mutations in BRCA1/2.  There are other genes that can be associated with hereditary breast cancer syndromes.  We discussed that testing is beneficial for several reasons including knowing how to follow individuals for their cancer risks, identifying whether potential treatment options, such as PARP inhibitors, would be beneficial, and understanding if other family members could be at risk for cancer and allowing them to undergo genetic testing.   We reviewed the characteristics, features and inheritance patterns of hereditary cancer syndromes. We also discussed genetic testing, including the appropriate family members to test, the process of testing, insurance coverage and turn-around-time for results. We  discussed the implications of a negative, positive, carrier and/or variant of uncertain significant result. We recommended Ms. Lochner pursue genetic testing for a panel that includes genes associated with breast, uterine, ovarian, and colon cancers.   The CustomNext-Cancer+RNAinsight panel offered by [facility] includes sequencing and rearrangement analysis for the following 47 genes:  APC, ATM, AXIN2, BARD1, BMPR1A, BRCA1, BRCA2, BRIP1, CDH1, CDK4, CDKN2A, CHEK2, DICER1, EPCAM, GREM1, HOXB13, MEN1, MLH1, MSH2, MSH3, MSH6, MUTYH, NBN, NF1, NF2, NTHL1, PALB2, PMS2, POLD1, POLE, PTEN, RAD51C, RAD51D, RECQL, RET, SDHA, SDHAF2, SDHB, SDHC, SDHD, SMAD4, SMARCA4, STK11, TP53, TSC1, TSC2, and VHL.  RNA data is routinely analyzed for use in variant interpretation for all genes.  Based on Ms. Street's personal and family history of cancer, she meets medical criteria for genetic testing. Despite that she meets criteria, she may still have an out of pocket cost. We discussed that if her out of pocket cost for testing is over $  100, the laboratory should contact her and discuss the self-pay prices and/or patient pay assistance programs.    PLAN: After considering the risks, benefits, and limitations, Ms. Spike provided informed consent to pursue genetic testing and the blood sample was sent to [facility] for analysis of the CustomNext-Cancer +RNAinsight Panel. Results should be available within approximately 3 weeks' time, at which point they will be disclosed by telephone to Ms. Koslowski, as will any additional recommendations warranted by these results. Ms. Pesnell will receive a summary of her genetic counseling visit and a copy of her results once available. This information will also be available in Epic.    Lastly, we encouraged Ms. Hayashida to remain in contact with cancer genetics annually so that we can continuously update the family history and inform her of any changes in cancer genetics and testing that  may be of benefit for this family.   Ms. Hiott questions were answered to her satisfaction today. Our contact information was provided should additional questions or concerns arise. Thank you for the referral and allowing [place] to share in the care of your patient.   Khair Chasteen M. Joette Catching, [place], [facility] Genetic Counselor [email or web address] (P) [phone]  The patient was seen for a total of 35 minutes in face-to-face genetic counseling.  The patient was seen alone.  Drs. Lindi Adie and/or Burr Medico were available to discuss this case as needed.    _______________________________________________________________________ For Office Staff:  Number of people involved in session: 1 Was an Intern/ student involved with case: no

## 2021-11-15 ENCOUNTER — Encounter: Payer: Self-pay | Admitting: Oncology

## 2021-11-15 ENCOUNTER — Other Ambulatory Visit (HOSPITAL_COMMUNITY): Payer: Self-pay

## 2021-11-15 ENCOUNTER — Other Ambulatory Visit: Payer: Self-pay | Admitting: Oncology

## 2021-11-15 ENCOUNTER — Telehealth: Payer: Self-pay

## 2021-11-15 ENCOUNTER — Telehealth: Payer: Self-pay | Admitting: Pharmacy Technician

## 2021-11-15 DIAGNOSIS — Z170 Estrogen receptor positive status [ER+]: Secondary | ICD-10-CM

## 2021-11-15 DIAGNOSIS — C50412 Malignant neoplasm of upper-outer quadrant of left female breast: Secondary | ICD-10-CM

## 2021-11-15 MED ORDER — PALBOCICLIB 100 MG PO CAPS
100.0000 mg | ORAL_CAPSULE | Freq: Every day | ORAL | 5 refills | Status: DC
  Filled 2021-11-15: qty 21, 21d supply, fill #0

## 2021-11-15 MED ORDER — PALBOCICLIB 125 MG PO TABS
125.0000 mg | ORAL_TABLET | Freq: Every day | ORAL | 5 refills | Status: DC
  Filled 2021-11-15: qty 21, 21d supply, fill #0

## 2021-11-15 NOTE — Telephone Encounter (Addendum)
Pt notified that Dr Hinton Rao has sent in the prescription and the pharmacy staff should reach out to her soon. I asked her to call us when she receives the [place]. Pt verbalized understanding. ----- Message from Derwood Kaplan, MD sent at 11/15/2021 10:12 AM EST ----- Regarding: RE: Leslee Home Sorry, I can see where I started to order, not sure why I didn't approve.  It is done now to [facility] ----- Message ----- From: Dairl Ponder, RN Sent: 11/15/2021   9:31 AM EST To: Derwood Kaplan, MD Subject: Leslee Home                                        Pt has called to ask when the Leslee Home will be sent in. She states she hasn't heard anything, and thought she was to be taking the drug before next appt on 11/26/21.

## 2021-11-15 NOTE — Telephone Encounter (Addendum)
Received New start notification for  Ibrance 125mg  . Will update as we work through the benefits process.  Received notification from [facility] regarding a prior authorization for  Ibrance 125mg  . Authorization has been APPROVED from 11/15/21 to 10/06/22.   Authorization # [id number] Phone # [phone]  Test claim did not yet process, will try again tomorrow.

## 2021-11-15 NOTE — Telephone Encounter (Signed)
Oral Oncology Pharmacist Encounter  Received new prescription for palbociclib Leslee Home) for the treatment of advanced hormone receptor positive, HER2 negative breast cancer in conjunction with anastrozole, planned duration until disease progression or unacceptable toxicity. Prescription dose and frequency assessed, message to MD sent regarding dose. Dose modified by MD to 171m.   Labs from 10/11/21 (CBC/ CMP) assessed, no interventions needed.  Current medication list in Epic reviewed, DDIs with ibrance identified: none  Evaluated chart and no patient barriers to medication adherence noted.   Patient agreement for treatment documented in MD note on 11/05/2021.  [facility].  Oral Oncology Clinic will continue to follow for insurance authorization, copayment issues, initial counseling and start date.  KDrema Halon PharmD Hematology/Oncology Clinical Pharmacist [phone]/06/2022 1:18 PM

## 2021-11-16 ENCOUNTER — Other Ambulatory Visit (HOSPITAL_COMMUNITY): Payer: Self-pay

## 2021-11-16 ENCOUNTER — Ambulatory Visit: Payer: Medicare HMO | Admitting: Oncology

## 2021-11-16 NOTE — Progress Notes (Signed)
Sent in application for free Ibrance to [employer].

## 2021-11-16 NOTE — Telephone Encounter (Signed)
Carrie Wilkerson patient assistance application to the office. Called patient and discussed benefits investigation. She will stop by the office this afternoon to complete the application.

## 2021-11-16 NOTE — Telephone Encounter (Signed)
Ran test claim, patient's copay for 28 day supply is $2,970.04. Will initiate patient assistance.

## 2021-11-16 NOTE — Telephone Encounter (Signed)
Patient stopped by office to complete [employer] PAP app. It has been faxed to program. Will follow up.   Phone# [phone], Monday-Friday, 8 am-8 pm ET.

## 2021-11-20 NOTE — Progress Notes (Signed)
Called and spoke with [employer], they are still processing the application for free Ibrance.

## 2021-11-20 NOTE — Progress Notes (Signed)
[facility]  [address] [place],  [place]  [address] [id number]  Clinic Day:  11/26/2021  Referring physician: Ernestene Kiel, MD  This document serves as a record of services personally performed by Hosie Poisson, MD. It was created on their behalf by Curry,Lauren E, a trained medical scribe. The creation of this record is based on the scribe's personal observations and the provider's statements to them.  ASSESSMENT & PLAN:   1. 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     2. Osteopenia, for which she has been receiving denosumab every 6 months, next due in March.  She continues calcium and vitamin-D as recommended.  She will be due for repeat bone density in September 2023.  3. 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 CT chest from December revealed right middle and lower lobe collapse with large right pleural effusion. No central obstructing mass lesion evident. No obvious right-sided pleural disease although there is a irregular focus of soft tissue attenuation along the medial right upper lobe pleura/right anterior mediastinum. Scattered tiny bilateral pulmonary nodules measuring up to 4 mm. Thoracentesis yielded 1200 cc's, and cytopathology confirmed malignant cells consistent with breast primary, estrogen receptor positive. She has been placed on anastrozole and we will be adding in oral chemotherapy with palbociclib.   She continues anastrozole 1 mg daily and has tolerated without difficulty. She has yet to be approved for palbociclib 125 mg and so we are contacting the company. Once she receives this, she may start this medication at 21 days on and 7 days off. We will plan to see her back in 2-3 weeks with CBC, CMP and CA 27.29  for repeat evaluation. The patient understands the plans discussed today and is in agreement with them.  She knows to contact our office if she develops concerns prior to her next visit.   I provided 15 minutes of face-to-face time during this this encounter and > 50% was spent counseling as documented under my assessment and plan.    Derwood Kaplan, MD [facility] [address] [place] [address] Dept: [phone] Dept Fax: [phone]    CHIEF COMPLAINT:  CC: Metastatic breast cancer with right pleural effusion  Current Treatment:  Anastrozole/palbociclib   HISTORY OF PRESENT ILLNESS:  Carrie Wilkerson is a 81 y.o. 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 She was treated with lumpectomy.  Pathology revealed a 1.5 cm, grade 1, invasive ductal carcinoma with 5 positive nodes.  Estrogen and progesterone receptors were positive and Her-2 Neu negative.  She received adjuvant chemotherapy with Adriamycin/Cytoxan for 3 months, followed by Taxol for 3 months.  She then received adjuvant radiation to the left breast.  She was placed on letrozole in April 2008 and completed 10 years of adjuvant hormonal therapy in April 2018.  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   Repeat bone density in September 2017 revealed slight improvement in the bone density, with a T-score of -2.1 in the forearm and a T-score of -1.1 in  the femur with, so she has continued denosumab every 6 months, in addition to calcium/vitamin D.  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    She had reversal of her colostomy in October 2017.  She has an incisional hernia in the abdomen, which does  not bother her, so she has decided against surgical repair.  She has atrial fibrillation and is on apixaban 5 mg twice daily.  Bone density scan in September 2019 revealed improvement in the bone density with a T-score of -1.6 in forearm.  The bone density of the femur had normalized with a T-score of -0.8.  She has continued denosumab every 6 months.  Annual bilateral mammogram in December 2020 did not reveal any evidence of malignancy.  She contracted COVID-19 later in December 2020.  She did not have to be hospitalized and fully recovered.  Bone density from September 2021 revealed mildly worsened osteopenia with a T-score of -2.0 of the left forearm radius, previously -1.6.  Left femur neck is normal at -0.9, previously -0.8.  She continues calcium 1200 mg with vitamin-D daily.  She presented to the emergency department in December 2022 due to shortness of breath that had been ongoing for the past 3 weeks. She was also having some left substernal area pain. ECHO revealed a normal EF between 60-65%. CT chest revealed right middle and lower lobe collapse with large right pleural effusion. There was no central obstructing mass lesion evident, or obvious right-sided pleural disease, although there is a irregular focus of soft tissue attenuation along the medial right upper lobe pleura/right anterior mediastinum. There were scattered tiny bilateral pulmonary nodules measuring up to 4 mm. Thoracentesis was pursued yielding 1200 cc's of pleural fluid. Cytopathology confirmed malignant cells consistent with breast primary. CKAE1/3, GATA-3 and ER positive. HER2 was negative 1+. Estrogen receptor was positive at 90% and progesterone receptor was positive at 20%. Ki67 was <1%. MRI brain was negative for evidence of metastasis. PET imaging from January 26th revealed no hypermetabolic activity to localize active metastatic breast carcinoma. Large right pleural effusion occupies 75% of the right hemithorax. Along the  anteromedial margin of the right diaphragm there is hypermetabolic linear thickening of the diaphragm.  INTERVAL HISTORY:  I have reviewed her chart and materials related to her cancer extensively and collaborated history with the patient. Summary of oncologic history is as follows: Oncology History  Malignant neoplasm of upper-outer quadrant of left female breast ([facility])  03/07/2006 Initial Diagnosis   Breast cancer, left breast ([facility])   03/17/2006 Cancer Staging   Staging form: Breast, AJCC 6th Edition - Clinical stage from 03/17/2006: Stage IIIA (T1c, N2a, M0) - Signed by Derwood Kaplan, MD on 12/15/2020 Staged by: Managing physician Diagnostic confirmation: Positive histology Specimen type: Excision Histopathologic type: Infiltrating duct carcinoma, NOS Tumor size (mm): 15 Laterality: Left Total positive nodes: 5 Histologic grade (G): G1 Residual tumor (R): R0 - None Stage prefix: Initial diagnosis Lymphatic vessel invasion (L): L0 - No lymphatic vessel invasion Venous invasion (V): V0 - No venous invasion Prognostic indicators: ER/PR pos, HER 2 neg., Rx with AC x 3 mo, then Taxol x 3 mo.,AI x 10 yrs     Giselle is here for routine follow up and states that she has been fairly well. She still will have shortness of breath with exertion, but states that this has improved since her last thoracentesis on January 31st. This yielded 1300 mL of pleural fluid. She continues anastrozole daily but has  not been approved for palbociclib yet. Blood counts and chemistries are unremarkable. Her  appetite is good, and her weight is stable since her last visit.  She denies fever, chills or other signs of infection.  She denies nausea, vomiting, bowel issues, or abdominal pain.  She denies sore throat, cough, dyspnea, or chest pain.  HISTORY:   Allergies:  Allergies  Allergen Reactions   Codeine     unknown    Sulfa Antibiotics     unknown    Chlorhexidine Rash    Current  Medications: Current Outpatient Medications  Medication Sig Dispense Refill   anastrozole (ARIMIDEX) 1 MG tablet Take 1 tablet (1 mg total) by mouth daily. 30 tablet 5   apixaban (ELIQUIS) 5 MG TABS tablet Take 1 tablet (5 mg total) by mouth 2 (two) times daily. 180 tablet 3   atorvastatin (LIPITOR) 10 MG tablet Take 1 tablet by mouth every other day.  4   CALCIUM PO Take 1 tablet by mouth daily.     cholecalciferol (VITAMIN D) 1000 UNITS tablet Take 1,000 Units by mouth daily.     denosumab (PROLIA) 60 MG/ML SOLN injection Inject 60 mg into the skin every 6 (six) months. Administer in upper arm, thigh, or abdomen     Flaxseed, Linseed, OIL Take 1 capsule by mouth daily.     glucosamine-chondroitin 500-400 MG tablet Take 1 tablet by mouth 2 (two) times daily.      levothyroxine (SYNTHROID, LEVOTHROID) 75 MCG tablet Take 75 mcg by mouth daily.   7   metoprolol tartrate (LOPRESSOR) 25 MG tablet Take 1 tablet (25 mg total) by mouth 2 (two) times daily. 180 tablet 3   Multiple Vitamin (MULTIVITAMIN WITH MINERALS) TABS Take 1 tablet by mouth daily.     ondansetron (ZOFRAN-ODT) 4 MG disintegrating tablet Take 4 mg by mouth 2 (two) times daily as needed for nausea/vomiting.     palbociclib (IBRANCE) 125 MG tablet Take 1 tablet (125 mg total) by mouth daily. Take for 21 days on, 7 days off, repeat every 28 days. 21 tablet 5   No current facility-administered medications for this visit.    REVIEW OF SYSTEMS:  Review of Systems  Constitutional:  Negative for appetite change, chills, fatigue, fever and unexpected weight change.  HENT:  Negative.    Eyes: Negative.   Respiratory:  Positive for shortness of breath (with exertion,improved). Negative for chest tightness, cough, hemoptysis and wheezing.   Cardiovascular: Negative.  Negative for chest pain, leg swelling and palpitations.  Gastrointestinal: Negative.  Negative for abdominal distention, abdominal pain, blood in stool, constipation, diarrhea,  nausea and vomiting.  Endocrine: Negative.   Genitourinary: Negative.  Negative for difficulty urinating, dysuria, frequency and hematuria.   Musculoskeletal: Negative.  Negative for arthralgias, back pain, flank pain, gait problem and myalgias.  Skin: Negative.   Neurological: Negative.  Negative for dizziness, extremity weakness, gait problem, headaches, light-headedness, numbness, seizures and speech difficulty.  Hematological: Negative.   Psychiatric/Behavioral: Negative.  Negative for depression and sleep disturbance. The patient is not nervous/anxious.      VITALS:  Blood pressure (!) 149/67, pulse 83, temperature 97.6 F (36.4 C), temperature source Oral, resp. rate 16, height 5' 2"  (1.575 m), weight 168 lb 1.6 oz (76.2 kg), SpO2 92 %.  Wt Readings from Last 3 Encounters:  11/26/21 168 lb 1.6 oz (76.2 kg)  11/05/21 168 lb 1.6 oz (76.2 kg)  10/25/21 169 lb 3.2 oz (76.7 kg)    Body mass index  is 30.75 kg/m.  Performance status (ECOG): 1 - Symptomatic but completely ambulatory  PHYSICAL EXAM:  Physical Exam Constitutional:      General: She is not in acute distress.    Appearance: Normal appearance. She is normal weight.  HENT:     Head: Normocephalic and atraumatic.  Eyes:     General: No scleral icterus.    Extraocular Movements: Extraocular movements intact.     Conjunctiva/sclera: Conjunctivae normal.     Pupils: Pupils are equal, round, and reactive to light.  Cardiovascular:     Rate and Rhythm: Normal rate and regular rhythm.     Pulses: Normal pulses.     Heart sounds: Normal heart sounds. No murmur heard.   No friction rub. No gallop.  Pulmonary:     Effort: Pulmonary effort is normal. No respiratory distress.     Breath sounds: Decreased breath sounds (at the right base) present.  Abdominal:     General: Bowel sounds are normal. There is no distension.     Palpations: Abdomen is soft. There is no hepatomegaly, splenomegaly or mass.     Tenderness: There is  no abdominal tenderness.  Musculoskeletal:        General: Normal range of motion.     Cervical back: Normal range of motion and neck supple.     Right lower leg: No edema.     Left lower leg: No edema.  Lymphadenopathy:     Cervical: No cervical adenopathy.  Skin:    General: Skin is warm and dry.  Neurological:     General: No focal deficit present.     Mental Status: She is alert and oriented to person, place, and time. Mental status is at baseline.  Psychiatric:        Mood and Affect: Mood normal.        Behavior: Behavior normal.        Thought Content: Thought content normal.        Judgment: Judgment normal.     LABS:   CBC Latest Ref Rng & Units 11/26/2021 10/11/2021 06/13/2021  WBC - 5.6 7.0 5.7  Hemoglobin 12.0 - 16.0 15.0 15.3 15.0  Hematocrit 36 - 46 45 44 45  Platelets 150 - 399 260 286 243   CMP Latest Ref Rng & Units 11/26/2021 10/11/2021 06/13/2021  Glucose 65 - 99 mg/dL - - -  BUN 4 - 21 13 12 15   Creatinine 0.5 - 1.1 0.7 0.6 0.8  Sodium 137 - 147 141 140 141  Potassium 3.4 - 5.3 4.1 4.1 3.9  Chloride 99 - 108 108 109(A) 108  CO2 13 - 22 27(A) 23(A) 22  Calcium 8.7 - 10.7 9.1 9.3 9.1  Total Protein 6.0 - 8.5 g/dL - - -  Total Bilirubin 0.0 - 1.2 mg/dL - - -  Alkaline Phos 25 - 125 58 56 50  AST 13 - 35 40(A) 34 31  ALT 7 - 35 21 23 16     STUDIES:  No results found.     I, Rita Ohara, am acting as scribe for Derwood Kaplan, MD  I have reviewed this report as typed by the medical scribe, and it is complete and accurate.

## 2021-11-23 NOTE — Progress Notes (Signed)
Called to speak with [employer] about patients application for free Peck, it is still processing. They stated they would send to Management for an expedited review since she is a new patient.

## 2021-11-23 NOTE — Telephone Encounter (Signed)
Called to check status of patient's [employer] PAP application- still in process- rep could not provide an ETA for a determination. Will continue to follow.

## 2021-11-26 ENCOUNTER — Inpatient Hospital Stay: Payer: Medicare HMO | Admitting: Oncology

## 2021-11-26 ENCOUNTER — Other Ambulatory Visit: Payer: Self-pay | Admitting: Hematology and Oncology

## 2021-11-26 ENCOUNTER — Inpatient Hospital Stay: Payer: Medicare HMO

## 2021-11-26 ENCOUNTER — Other Ambulatory Visit: Payer: Self-pay

## 2021-11-26 VITALS — BP 149/67 | HR 83 | Temp 97.6°F | Resp 16 | Ht 62.0 in | Wt 168.1 lb

## 2021-11-26 DIAGNOSIS — Z170 Estrogen receptor positive status [ER+]: Secondary | ICD-10-CM

## 2021-11-26 DIAGNOSIS — J910 Malignant pleural effusion: Secondary | ICD-10-CM | POA: Diagnosis not present

## 2021-11-26 DIAGNOSIS — C50412 Malignant neoplasm of upper-outer quadrant of left female breast: Secondary | ICD-10-CM | POA: Diagnosis not present

## 2021-11-26 LAB — CBC AND DIFFERENTIAL
HCT: 45 (ref 36–46)
Hemoglobin: 15 (ref 12.0–16.0)
Neutrophils Absolute: 2.91
Platelets: 260 (ref 150–399)
WBC: 5.6

## 2021-11-26 LAB — HEPATIC FUNCTION PANEL
ALT: 21 (ref 7–35)
AST: 40 — AB (ref 13–35)
Alkaline Phosphatase: 58 (ref 25–125)
Bilirubin, Total: 0.5

## 2021-11-26 LAB — COMPREHENSIVE METABOLIC PANEL
Albumin: 3.8 (ref 3.5–5.0)
Calcium: 9.1 (ref 8.7–10.7)

## 2021-11-26 LAB — CBC
MCV: 93 (ref 81–99)
RBC: 4.86 (ref 3.87–5.11)

## 2021-11-26 LAB — BASIC METABOLIC PANEL
BUN: 13 (ref 4–21)
CO2: 27 — AB (ref 13–22)
Chloride: 108 (ref 99–108)
Creatinine: 0.7 (ref 0.5–1.1)
Glucose: 104
Potassium: 4.1 (ref 3.4–5.3)
Sodium: 141 (ref 137–147)

## 2021-11-29 NOTE — Progress Notes (Signed)
Called and spoke with [employer], patients application for free Leslee Home is still processing.

## 2021-11-30 ENCOUNTER — Telehealth: Payer: Self-pay | Admitting: Genetic Counselor

## 2021-11-30 ENCOUNTER — Encounter: Payer: Self-pay | Admitting: Genetic Counselor

## 2021-11-30 DIAGNOSIS — Z1379 Encounter for other screening for genetic and chromosomal anomalies: Secondary | ICD-10-CM

## 2021-11-30 HISTORY — DX: Encounter for other screening for genetic and chromosomal anomalies: Z13.79

## 2021-11-30 NOTE — Telephone Encounter (Signed)
Attempted to call to dislcose results of genetic testing.  No answer and unable to LVM.  Will attempt to call early next week.

## 2021-12-03 NOTE — Progress Notes (Signed)
Patient was approved for free Ibrance through 10/06/2022. PAT - [id number] Called and ordered her first shipment, it will be at her house tomorrow.

## 2021-12-03 NOTE — Telephone Encounter (Signed)
Oral Chemotherapy Pharmacist Encounter  I spoke with patient for overview of: Ibrance for the treatment of advanced, hormone-receptor positive, HER2 receptor negative breast cancer, in combination with anastrozole, planned duration until disease progression or unacceptable toxicity.   Counseled patient on administration, dosing, side effects, monitoring, drug-food interactions, safe handling, storage, and disposal.  Patient will take Ibrance 149m tablets, 1 tablet by mouth once daily, with or without food, taken for 3 weeks on, 1 week off, and repeated.  Patient knows to avoid grapefruit and grapefruit juice while on treatment with Ibrance.  Patient is taking anastrozole once daily.   Ibrance start date: 12/04/2021  Adverse effects include but are not limited to: fatigue, hair loss, GI upset, nausea, decreased blood counts, and increased upper respiratory infections. Severe, life-threatening, and/or fatal interstitial lung disease (ILD) and/or pneumonitis may occur with CDK 4/6 inhibitors.  Patient will obtain anti diarrheal and alert the office of 4 or more loose stools above baseline.  Patient reminded of WBC check on Cycle 1 Day 14 for dose and ANC assessment.  Reviewed with patient importance of keeping a medication schedule and plan for any missed doses. No barriers to medication adherence identified.  Medication reconciliation performed and medication/allergy list updated.  Insurance authorization for ILeslee Homehas been obtained.  Patient informed the pharmacy will reach out 5-7 days prior to needing next fill of Ibrance to coordinate continued medication acquisition to prevent break in therapy.  All questions answered.  Mrs. SSonntagvoiced understanding and appreciation.   Medication education handout placed in mail for patient. Patient knows to call the office with questions or concerns. Oral Chemotherapy Clinic phone number provided to patient.   KDrema Halon  PharmD Hematology/Oncology Clinical Pharmacist [phone]/27/2023   12:29 PM

## 2021-12-04 NOTE — Telephone Encounter (Signed)
Patient was approved for patient assistance 

## 2021-12-05 ENCOUNTER — Encounter: Payer: Self-pay | Admitting: Oncology

## 2021-12-06 ENCOUNTER — Ambulatory Visit: Payer: Self-pay | Admitting: Genetic Counselor

## 2021-12-06 ENCOUNTER — Telehealth: Payer: Self-pay | Admitting: Genetic Counselor

## 2021-12-06 DIAGNOSIS — Z1379 Encounter for other screening for genetic and chromosomal anomalies: Secondary | ICD-10-CM

## 2021-12-06 DIAGNOSIS — Z8049 Family history of malignant neoplasm of other genital organs: Secondary | ICD-10-CM

## 2021-12-06 DIAGNOSIS — Z800 Family history of malignant neoplasm of digestive organs: Secondary | ICD-10-CM

## 2021-12-06 DIAGNOSIS — C50412 Malignant neoplasm of upper-outer quadrant of left female breast: Secondary | ICD-10-CM

## 2021-12-06 DIAGNOSIS — Z803 Family history of malignant neoplasm of breast: Secondary | ICD-10-CM

## 2021-12-06 DIAGNOSIS — Z170 Estrogen receptor positive status [ER+]: Secondary | ICD-10-CM

## 2021-12-06 NOTE — Progress Notes (Signed)
HPI:   ?Carrie Wilkerson was previously seen in the [facility] due to a personal and family history of cancer and concerns regarding a hereditary predisposition to cancer. Please refer to our prior cancer genetics clinic note for more information regarding our discussion, assessment and recommendations, at the time. Carrie Wilkerson recent genetic test results were disclosed to her, as were recommendations warranted by these results. These results and recommendations are discussed in more detail below. ? ?CANCER HISTORY:  ?Oncology History  ?Malignant neoplasm of upper-outer quadrant of left female breast ([facility])  ?03/07/2006 Initial Diagnosis  ? Breast cancer, left breast ([facility]) ?  ?03/17/2006 Cancer Staging  ? Staging form: Breast, AJCC 6th Edition ?- Clinical stage from 03/17/2006: Stage IIIA (T1c, N2a, M0) - Signed by Derwood Kaplan, MD on 12/15/2020 ?Staged by: Managing physician ?Diagnostic confirmation: Positive histology ?Specimen type: Excision ?Histopathologic type: Infiltrating duct carcinoma, NOS ?Tumor size (mm): 15 ?Laterality: Left ?Total positive nodes: 5 ?Histologic grade (G): G1 ?Residual tumor (R): R0 - None ?Stage prefix: Initial diagnosis ?Lymphatic vessel invasion (L): L0 - No lymphatic vessel invasion ?Venous invasion (V): V0 - No venous invasion ?Prognostic indicators: ER/PR pos, HER 2 neg., Rx with AC x 3 mo, then Taxol x 3 mo.,AI x 10 yrs ? ?  ? ? ?FAMILY HISTORY:  ?We obtained a detailed, 4-generation family history.  Significant diagnoses are listed below: ?     ?Family History  ?Problem Relation Age of Onset  ? Cancer Sister 36  ?      uterine or ovarian  ? Bone cancer Sister 40  ? Breast cancer Sister    ?      dx 25 and 23  ? Uterine cancer Sister 71  ? Colon cancer Sister 108  ? Breast cancer Sister 26  ? Kidney cancer Brother 101  ? Cancer Brother 57  ?      unknown type; mets  ? Liver cancer Brother    ? Liver cancer Brother 72  ? Pancreatic cancer Niece 86  ? Breast cancer  Niece 93  ? Colon cancer Nephew 57  ?      mets  ?  ?Carrie Wilkerson is unaware of previous family history of genetic testing for hereditary cancer risks. There is no reported Ashkenazi Jewish ancestry. There is no known consanguinity. ?  ? ?GENETIC TEST RESULTS:  ?The [facility] CustomNext-Cancer+RNAinsight Panel found no pathogenic mutations. The CustomNext-Cancer+RNAinsight panel offered by [facility] includes sequencing and rearrangement analysis for the following 47 genes:  APC, ATM, AXIN2, BARD1, BMPR1A, BRCA1, BRCA2, BRIP1, CDH1, CDK4, CDKN2A, CHEK2, DICER1, EPCAM, GREM1, HOXB13, MEN1, MLH1, MSH2, MSH3, MSH6, MUTYH, NBN, NF1, NF2, NTHL1, PALB2, PMS2, POLD1, POLE, PTEN, RAD51C, RAD51D, RECQL, RET, SDHA, SDHAF2, SDHB, SDHC, SDHD, SMAD4, SMARCA4, STK11, TP53, TSC1, TSC2, and VHL.  RNA data is routinely analyzed for use in variant interpretation for all genes. ? ?The test report has been scanned into EPIC and is located under the Molecular Pathology section of the Results Review tab.  A portion of the result report is included below for reference. Genetic testing reported out on November 29, 2021.  ? ? ? ?Even though a pathogenic variant was not identified, possible explanations for the cancer in the family may include: ?There may be no hereditary risk for cancer in the family. The cancers in Carrie Wilkerson and/or her family may be sporadic/familial or due to other genetic and environmental factors. ?There may be a gene mutation in one of  these genes that current testing methods cannot detect but that chance is small. ?There could be another gene that has not yet been discovered, or that we have not yet tested, that is responsible for the cancer diagnoses in the family.  ?It is also possible there is a hereditary cause for the cancer in the family that Carrie Wilkerson did not inherit. ? ? ?Therefore, it is important to remain in touch with cancer genetics in the future so that we can continue to offer Carrie Wilkerson the most up to date  genetic testing.  ? ? ?ADDITIONAL GENETIC TESTING:  ?We discussed with Carrie Wilkerson that her genetic testing was fairly extensive.  If there are genes identified to increase cancer risk that can be analyzed in the future, we would be happy to discuss and coordinate this testing at that time.   ? ?CANCER SCREENING RECOMMENDATIONS:  ?Carrie Wilkerson's test result is considered negative (normal).  This means that we have not identified a hereditary cause for her personal history of cancer at this time.  ? ?An individual's cancer risk and medical management are not determined by genetic test results alone. Overall cancer risk assessment incorporates additional factors, including personal medical history, family history, and any available genetic information that may result in a personalized plan for cancer prevention and surveillance. Therefore, it is recommended she continue to follow the cancer management and screening guidelines provided by her oncology and primary healthcare provider. ? ?RECOMMENDATIONS FOR FAMILY MEMBERS:   ?Since she did not inherit a identifiable mutation in a cancer predisposition gene included on this panel, her children could not have inherited a known mutation from her in one of these genes. ?Individuals in this family might be at some increased risk of developing cancer, over the general population risk, due to the family history of cancer.  Individuals in the family should notify their providers of the family history of cancer. We recommend women in this family have a yearly mammogram beginning at age 59, or 86 years younger than the earliest onset of cancer, an annual clinical breast exam, and perform monthly breast self-exams.  Family members should have colonoscopies by at age 37, or earlier, as recommended by their providers.  ?Other members of the family may still carry a pathogenic variant in one of these genes that Carrie Wilkerson did not inherit. Based on the family history, we recommend first  degree relatives of her sister, Carrie Wilkerson, have genetic counseling and testing. Carrie Wilkerson will let us know if we can be of any assistance in coordinating genetic counseling and/or testing for this family member.   ? ? ?FOLLOW-UP:  ?Lastly, we discussed with Carrie Wilkerson that cancer genetics is a rapidly advancing field and it is possible that new genetic tests will be appropriate for her and/or her family members in the future. We encouraged her to remain in contact with cancer genetics on an annual basis so we can update her personal and family histories and let her know of advances in cancer genetics that may benefit this family.  ? ?Our contact number was provided. Carrie Wilkerson questions were answered to her satisfaction, and she knows she is welcome to call us at anytime with additional questions or concerns.  ? ?Beatrix Breece M. Joette Catching, [place], [place] ?Genetic Counselor ?[email or web address] ?(P) [phone] ? ?

## 2021-12-06 NOTE — Progress Notes (Signed)
[facility]  [address] [place],  [place]  [address] [phone]  Clinic Day:  12/11/2021  Referring physician: Ernestene Kiel, MD  This document serves as a record of services personally performed by Hosie Poisson, MD. It was created on their behalf by Curry,Lauren E, a trained medical scribe. The creation of this record is based on the scribe's personal observations and the provider's statements to them.  ASSESSMENT & PLAN:   1. 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     2. Osteopenia, for which she has been receiving denosumab every 6 months, next due in March.  She continues calcium and vitamin-D as recommended.  She will be due for repeat bone density in September 2023.  3. 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 CT chest from December revealed right middle and lower lobe collapse with large right pleural effusion. No central obstructing mass lesion evident. No obvious right-sided pleural disease although there is a irregular focus of soft tissue attenuation along the medial right upper lobe pleura/right anterior mediastinum. Scattered tiny bilateral pulmonary nodules measuring up to 4 mm. Thoracentesis yielded 1200 cc's, and cytopathology confirmed malignant cells consistent with breast primary, estrogen receptor positive. She has been placed on anastrozole and we have added in oral chemotherapy with palbociclib 125 mg. She had a 2nd thoracentesis in February which yielded 1300 cc.   She has now started palbociclib 125 mg as of February 28th, and denies any difficulty. He counts also remain in the normal range currently. She knows to continues this medication for 21 days on and 7 days off. She will also continue anastrozole 1 mg  daily.  We will plan to see her back in 2 weeks with CBC and CMP for repeat evaluation. The patient understands the plans discussed today and is in agreement with them.  She knows to contact our office if she develops concerns prior to her next visit.   I provided 15 minutes of face-to-face time during this this encounter and > 50% was spent counseling as documented under my assessment and plan.    Derwood Kaplan, MD [facility] [address] [place] [address] Dept: [phone] Dept Fax: [phone]    CHIEF COMPLAINT:  CC: Metastatic breast cancer with right pleural effusion  Current Treatment:  Anastrozole/palbociclib   HISTORY OF PRESENT ILLNESS:  Carrie Wilkerson is a 81 y.o. 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 She was treated with lumpectomy.  Pathology revealed a 1.5 cm, grade 1, invasive ductal carcinoma with 5 positive nodes.  Estrogen and progesterone receptors were positive and Her-2 Neu negative.  She received adjuvant chemotherapy with Adriamycin/Cytoxan for 3 months, followed by Taxol for 3 months.  She then received adjuvant radiation to the left breast.  She was placed on letrozole in April 2008 and completed 10 years of adjuvant hormonal therapy in April 2018.  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   Repeat bone density in September 2017 revealed slight improvement in the bone density, with a  T-score of -2.1 in the forearm and a T-score of -1.1 in the femur with, so she has continued denosumab every 6 months, in addition to calcium/vitamin D.  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    She had reversal of her colostomy in  October 2017.  She has an incisional hernia in the abdomen, which does not bother her, so she has decided against surgical repair.  She has atrial fibrillation and is on apixaban 5 mg twice daily.  Bone density scan in September 2019 revealed improvement in the bone density with a T-score of -1.6 in forearm.  The bone density of the femur had normalized with a T-score of -0.8.  She has continued denosumab every 6 months.  Annual bilateral mammogram in December 2020 did not reveal any evidence of malignancy.  She contracted COVID-19 later in December 2020.  She did not have to be hospitalized and fully recovered.  Bone density from September 2021 revealed mildly worsened osteopenia with a T-score of -2.0 of the left forearm radius, previously -1.6.  Left femur neck is normal at -0.9, previously -0.8.  She continues calcium 1200 mg with vitamin-D daily.  She presented to the emergency department in December 2022 due to shortness of breath that had been ongoing for the past 3 weeks. She was also having some left substernal area pain. ECHO revealed a normal EF between 60-65%. CT chest revealed right middle and lower lobe collapse with large right pleural effusion. There was no central obstructing mass lesion evident, or obvious right-sided pleural disease, although there is a irregular focus of soft tissue attenuation along the medial right upper lobe pleura/right anterior mediastinum. There were scattered tiny bilateral pulmonary nodules measuring up to 4 mm. Thoracentesis was pursued yielding 1200 cc's of pleural fluid. Cytopathology confirmed malignant cells consistent with breast primary. CKAE1/3, GATA-3 and ER positive. HER2 was negative 1+. Estrogen receptor was positive at 90% and progesterone receptor was positive at 20%. Ki67 was <1%. MRI brain was negative for evidence of metastasis. PET imaging from January 26th revealed no hypermetabolic activity to localize active metastatic breast carcinoma. Large right  pleural effusion occupies 75% of the right hemithorax. Along the anteromedial margin of the right diaphragm there is hypermetabolic linear thickening of the diaphragm.  INTERVAL HISTORY:  I have reviewed her chart and materials related to her cancer extensively and collaborated history with the patient. Summary of oncologic history is as follows: Oncology History  Malignant neoplasm of upper-outer quadrant of left female breast ([facility])  03/07/2006 Initial Diagnosis   Breast cancer, left breast ([facility])   03/17/2006 Cancer Staging   Staging form: Breast, AJCC 6th Edition - Clinical stage from 03/17/2006: Stage IIIA (T1c, N2a, M0) - Signed by Derwood Kaplan, MD on 12/15/2020 Staged by: Managing physician Diagnostic confirmation: Positive histology Specimen type: Excision Histopathologic type: Infiltrating duct carcinoma, NOS Tumor size (mm): 15 Laterality: Left Total positive nodes: 5 Histologic grade (G): G1 Residual tumor (R): R0 - None Stage prefix: Initial diagnosis Lymphatic vessel invasion (L): L0 - No lymphatic vessel invasion Venous invasion (V): V0 - No venous invasion Prognostic indicators: ER/PR pos, HER 2 neg., Rx with AC x 3 mo, then Taxol x 3 mo.,AI x 10 yrs     Carrie Wilkerson is here for routine follow up and states that she is doing fairly well. She has a malignant right pleural effusion and had a 2nd thoracentesis last month which yielded 1300 cc. She has now received palbociclib  125 mg, which she started on February 28th, and has had no difficulty with the medication. She also continues anastrozole 1 mg daily without issue. She still has mild shortness of breath with exertion, but improved. Blood counts and chemistries are unremarkable. Her  appetite is good, and she has lost 3 and 1/2 pounds since her last visit.  She denies fever, chills or other signs of infection.  She denies nausea, vomiting, bowel issues, or abdominal pain.  She denies sore throat, cough, dyspnea, or chest  pain.  HISTORY:   Allergies:  Allergies  Allergen Reactions   Codeine     unknown    Sulfa Antibiotics     unknown    Chlorhexidine Rash    Current Medications: Current Outpatient Medications  Medication Sig Dispense Refill   anastrozole (ARIMIDEX) 1 MG tablet Take 1 tablet (1 mg total) by mouth daily. 30 tablet 5   apixaban (ELIQUIS) 5 MG TABS tablet Take 1 tablet (5 mg total) by mouth 2 (two) times daily. 180 tablet 3   atorvastatin (LIPITOR) 10 MG tablet Take 1 tablet by mouth every other day.  4   CALCIUM PO Take 1 tablet by mouth daily.     cholecalciferol (VITAMIN D) 1000 UNITS tablet Take 1,000 Units by mouth daily.     denosumab (PROLIA) 60 MG/ML SOLN injection Inject 60 mg into the skin every 6 (six) months. Administer in upper arm, thigh, or abdomen     Flaxseed, Linseed, OIL Take 1 capsule by mouth daily.     glucosamine-chondroitin 500-400 MG tablet Take 1 tablet by mouth 2 (two) times daily.      levothyroxine (SYNTHROID, LEVOTHROID) 75 MCG tablet Take 75 mcg by mouth daily.   7   metoprolol tartrate (LOPRESSOR) 25 MG tablet Take 1 tablet (25 mg total) by mouth 2 (two) times daily. 180 tablet 3   Multiple Vitamin (MULTIVITAMIN WITH MINERALS) TABS Take 1 tablet by mouth daily.     ondansetron (ZOFRAN-ODT) 4 MG disintegrating tablet Take 4 mg by mouth 2 (two) times daily as needed for nausea/vomiting.     palbociclib (IBRANCE) 125 MG tablet Take 1 tablet (125 mg total) by mouth daily. Take for 21 days on, 7 days off, repeat every 28 days. 21 tablet 5   No current facility-administered medications for this visit.    REVIEW OF SYSTEMS:  Review of Systems  Constitutional:  Negative for appetite change, chills, fatigue, fever and unexpected weight change.  HENT:  Negative.    Eyes: Negative.   Respiratory:  Positive for shortness of breath (with exertion, very mild). Negative for chest tightness, cough, hemoptysis and wheezing.   Cardiovascular: Negative.  Negative  for chest pain, leg swelling and palpitations.  Gastrointestinal: Negative.  Negative for abdominal distention, abdominal pain, blood in stool, constipation, diarrhea, nausea and vomiting.  Endocrine: Negative.   Genitourinary: Negative.  Negative for difficulty urinating, dysuria, frequency and hematuria.   Musculoskeletal: Negative.  Negative for arthralgias, back pain, flank pain, gait problem and myalgias.  Skin: Negative.   Neurological: Negative.  Negative for dizziness, extremity weakness, gait problem, headaches, light-headedness, numbness, seizures and speech difficulty.  Hematological: Negative.   Psychiatric/Behavioral: Negative.  Negative for depression and sleep disturbance. The patient is not nervous/anxious.      VITALS:  Blood pressure 134/67, pulse 69, temperature 97.7 F (36.5 C), temperature source Oral, resp. rate 18, height 5' 2"  (1.575 m), weight 164 lb 9.6 oz (74.7 kg), SpO2 94 %.  Wt Readings  from Last 3 Encounters:  12/11/21 164 lb 9.6 oz (74.7 kg)  11/26/21 168 lb 1.6 oz (76.2 kg)  11/05/21 168 lb 1.6 oz (76.2 kg)    Body mass index is 30.11 kg/m.  Performance status (ECOG): 1 - Symptomatic but completely ambulatory  PHYSICAL EXAM:  Physical Exam Constitutional:      General: She is not in acute distress.    Appearance: Normal appearance. She is normal weight.  HENT:     Head: Normocephalic and atraumatic.  Eyes:     General: No scleral icterus.    Extraocular Movements: Extraocular movements intact.     Conjunctiva/sclera: Conjunctivae normal.     Pupils: Pupils are equal, round, and reactive to light.  Cardiovascular:     Rate and Rhythm: Normal rate and regular rhythm.     Pulses: Normal pulses.     Heart sounds: Murmur heard.  Systolic murmur is present with a grade of 2/6.    No friction rub. No gallop.  Pulmonary:     Effort: Pulmonary effort is normal. No respiratory distress.     Breath sounds: Decreased breath sounds (at the right base)  present.  Chest:  Breasts:    Left: Inverted nipple present.     Comments: Well healed scar in the lower outer quadrant of the left breast and left axilla. No masses in either breast.  Abdominal:     General: Bowel sounds are normal. There is no distension.     Palpations: Abdomen is soft. There is no hepatomegaly, splenomegaly or mass.     Tenderness: There is no abdominal tenderness.  Musculoskeletal:        General: Normal range of motion.     Cervical back: Normal range of motion and neck supple.     Right lower leg: No edema.     Left lower leg: No edema.  Lymphadenopathy:     Cervical: No cervical adenopathy.  Skin:    General: Skin is warm and dry.  Neurological:     General: No focal deficit present.     Mental Status: She is alert and oriented to person, place, and time. Mental status is at baseline.  Psychiatric:        Mood and Affect: Mood normal.        Behavior: Behavior normal.        Thought Content: Thought content normal.        Judgment: Judgment normal.     LABS:   CBC Latest Ref Rng & Units 12/11/2021 11/26/2021 10/11/2021  WBC - 4.2 5.6 7.0  Hemoglobin 12.0 - 16.0 14.1 15.0 15.3  Hematocrit 36 - 46 43 45 44  Platelets 150 - 399 254 260 286   CMP Latest Ref Rng & Units 12/11/2021 11/26/2021 10/11/2021  Glucose 65 - 99 mg/dL - - -  BUN 4 - 21 14 13 12   Creatinine 0.5 - 1.1 0.8 0.7 0.6  Sodium 137 - 147 142 141 140  Potassium 3.4 - 5.3 4.0 4.1 4.1  Chloride 99 - 108 108 108 109(A)  CO2 13 - 22 27(A) 27(A) 23(A)  Calcium 8.7 - 10.7 9.6 9.1 9.3  Total Protein 6.0 - 8.5 g/dL - - -  Total Bilirubin 0.0 - 1.2 mg/dL - - -  Alkaline Phos 25 - 125 51 58 56  AST 13 - 35 26 40(A) 34  ALT 7 - 35 17 21 23     STUDIES:  No results found.     I,  Rita Ohara, am acting as scribe for Derwood Kaplan, MD  I have reviewed this report as typed by the medical scribe, and it is complete and accurate.

## 2021-12-06 NOTE — Telephone Encounter (Signed)
Revealed negative genetic testing.  Discussed that we do not know why she has breast cancer or why there is cancer in the family. It could be sporadic/famillial, due to a change in a gene that she did not inherit, due to a different gene that we are not testing, or maybe our current technology may not be able to pick something up.  It will be important for her to keep in contact with genetics to keep up with whether additional testing may be needed.     

## 2021-12-07 ENCOUNTER — Encounter: Payer: Self-pay | Admitting: Genetic Counselor

## 2021-12-11 ENCOUNTER — Inpatient Hospital Stay: Payer: Medicare HMO | Admitting: Oncology

## 2021-12-11 ENCOUNTER — Telehealth: Payer: Self-pay | Admitting: Oncology

## 2021-12-11 ENCOUNTER — Encounter: Payer: Self-pay | Admitting: Oncology

## 2021-12-11 ENCOUNTER — Inpatient Hospital Stay: Payer: Medicare HMO | Attending: Oncology

## 2021-12-11 ENCOUNTER — Other Ambulatory Visit: Payer: Self-pay

## 2021-12-11 ENCOUNTER — Telehealth: Payer: Self-pay

## 2021-12-11 ENCOUNTER — Other Ambulatory Visit: Payer: Self-pay | Admitting: Oncology

## 2021-12-11 VITALS — BP 134/67 | HR 69 | Temp 97.7°F | Resp 18 | Ht 62.0 in | Wt 164.6 lb

## 2021-12-11 DIAGNOSIS — C50412 Malignant neoplasm of upper-outer quadrant of left female breast: Secondary | ICD-10-CM

## 2021-12-11 DIAGNOSIS — Z170 Estrogen receptor positive status [ER+]: Secondary | ICD-10-CM

## 2021-12-11 DIAGNOSIS — Z853 Personal history of malignant neoplasm of breast: Secondary | ICD-10-CM | POA: Diagnosis not present

## 2021-12-11 DIAGNOSIS — M8589 Other specified disorders of bone density and structure, multiple sites: Secondary | ICD-10-CM | POA: Diagnosis not present

## 2021-12-11 DIAGNOSIS — J910 Malignant pleural effusion: Secondary | ICD-10-CM

## 2021-12-11 DIAGNOSIS — M8580 Other specified disorders of bone density and structure, unspecified site: Secondary | ICD-10-CM | POA: Insufficient documentation

## 2021-12-11 LAB — BASIC METABOLIC PANEL
BUN: 14 (ref 4–21)
CO2: 27 — AB (ref 13–22)
Chloride: 108 (ref 99–108)
Creatinine: 0.8 (ref 0.5–1.1)
Glucose: 109
Potassium: 4 (ref 3.4–5.3)
Sodium: 142 (ref 137–147)

## 2021-12-11 LAB — HEPATIC FUNCTION PANEL
ALT: 17 (ref 7–35)
AST: 26 (ref 13–35)
Alkaline Phosphatase: 51 (ref 25–125)
Bilirubin, Total: 0.7

## 2021-12-11 LAB — CBC AND DIFFERENTIAL
HCT: 43 (ref 36–46)
Hemoglobin: 14.1 (ref 12.0–16.0)
Neutrophils Absolute: 2.35
Platelets: 254 (ref 150–399)
WBC: 4.2

## 2021-12-11 LAB — COMPREHENSIVE METABOLIC PANEL
Albumin: 3.9 (ref 3.5–5.0)
Calcium: 9.6 (ref 8.7–10.7)

## 2021-12-11 LAB — CBC: RBC: 4.7 (ref 3.87–5.11)

## 2021-12-11 NOTE — Telephone Encounter (Signed)
I spoke with Carrie Wilkerson. She is taking the Ibrance @ 7am, without food. She denies missed doses since starting a week ago. She denies N/V, rashes, fevers, & diarrhea. I reminded pt of the importance of calling us if she develops temp of 100.4 or higher, day or night. Pt verbalized understanding. ?

## 2021-12-11 NOTE — Telephone Encounter (Signed)
Per 12/11/21 los next appt scheduled and confirmed with patient ?

## 2021-12-12 ENCOUNTER — Encounter: Payer: Self-pay | Admitting: Oncology

## 2021-12-12 NOTE — Addendum Note (Signed)
Addended by: Juanetta Beets on: 12/12/2021 03:21 PM ? ? Modules accepted: Orders ? ?

## 2021-12-13 ENCOUNTER — Inpatient Hospital Stay: Payer: Medicare HMO

## 2021-12-13 VITALS — BP 129/69 | HR 68 | Temp 98.2°F | Resp 18

## 2021-12-13 DIAGNOSIS — M8580 Other specified disorders of bone density and structure, unspecified site: Secondary | ICD-10-CM | POA: Diagnosis not present

## 2021-12-13 DIAGNOSIS — M8589 Other specified disorders of bone density and structure, multiple sites: Secondary | ICD-10-CM

## 2021-12-13 MED ORDER — DENOSUMAB 60 MG/ML ~~LOC~~ SOSY
60.0000 mg | PREFILLED_SYRINGE | Freq: Once | SUBCUTANEOUS | Status: AC
  Administered 2021-12-13: 60 mg via SUBCUTANEOUS
  Filled 2021-12-13: qty 1

## 2021-12-13 NOTE — Patient Instructions (Signed)
Denosumab injection ?What is this medication? ?DENOSUMAB (den oh sue mab) slows bone breakdown. Prolia is used to treat osteoporosis in women after menopause and in men, and in people who are taking corticosteroids for 6 months or more. Xgeva is used to treat a high calcium level due to cancer and to prevent bone fractures and other bone problems caused by multiple myeloma or cancer bone metastases. Xgeva is also used to treat giant cell tumor of the bone. ?This medicine may be used for other purposes; ask your health care provider or pharmacist if you have questions. ?COMMON BRAND NAME(S): Prolia, XGEVA ?What should I tell my care team before I take this medication? ?They need to know if you have any of these conditions: ?dental disease ?having surgery or tooth extraction ?infection ?kidney disease ?low levels of calcium or Vitamin D in the blood ?malnutrition ?on hemodialysis ?skin conditions or sensitivity ?thyroid or parathyroid disease ?an unusual reaction to denosumab, other medicines, foods, dyes, or preservatives ?pregnant or trying to get pregnant ?breast-feeding ?How should I use this medication? ?This medicine is for injection under the skin. It is given by a health care professional in a hospital or clinic setting. ?A special MedGuide will be given to you before each treatment. Be sure to read this information carefully each time. ?For Prolia, talk to your pediatrician regarding the use of this medicine in children. Special care may be needed. For Xgeva, talk to your pediatrician regarding the use of this medicine in children. While this drug may be prescribed for children as young as 13 years for selected conditions, precautions do apply. ?Overdosage: If you think you have taken too much of this medicine contact a poison control center or emergency room at once. ?NOTE: This medicine is only for you. Do not share this medicine with others. ?What if I miss a dose? ?It is important not to miss your dose.  Call your doctor or health care professional if you are unable to keep an appointment. ?What may interact with this medication? ?Do not take this medicine with any of the following medications: ?other medicines containing denosumab ?This medicine may also interact with the following medications: ?medicines that lower your chance of fighting infection ?steroid medicines like prednisone or cortisone ?This list may not describe all possible interactions. Give your health care provider a list of all the medicines, herbs, non-prescription drugs, or dietary supplements you use. Also tell them if you smoke, drink alcohol, or use illegal drugs. Some items may interact with your medicine. ?What should I watch for while using this medication? ?Visit your doctor or health care professional for regular checks on your progress. Your doctor or health care professional may order blood tests and other tests to see how you are doing. ?Call your doctor or health care professional for advice if you get a fever, chills or sore throat, or other symptoms of a cold or flu. Do not treat yourself. This drug may decrease your body's ability to fight infection. Try to avoid being around people who are sick. ?You should make sure you get enough calcium and vitamin D while you are taking this medicine, unless your doctor tells you not to. Discuss the foods you eat and the vitamins you take with your health care professional. ?See your dentist regularly. Brush and floss your teeth as directed. Before you have any dental work done, tell your dentist you are receiving this medicine. ?Do not become pregnant while taking this medicine or for 5 months after   stopping it. Talk with your doctor or health care professional about your birth control options while taking this medicine. Women should inform their doctor if they wish to become pregnant or think they might be pregnant. There is a potential for serious side effects to an unborn child. Talk to  your health care professional or pharmacist for more information. ?What side effects may I notice from receiving this medication? ?Side effects that you should report to your doctor or health care professional as soon as possible: ?allergic reactions like skin rash, itching or hives, swelling of the face, lips, or tongue ?bone pain ?breathing problems ?dizziness ?jaw pain, especially after dental work ?redness, blistering, peeling of the skin ?signs and symptoms of infection like fever or chills; cough; sore throat; pain or trouble passing urine ?signs of low calcium like fast heartbeat, muscle cramps or muscle pain; pain, tingling, numbness in the hands or feet; seizures ?unusual bleeding or bruising ?unusually weak or tired ?Side effects that usually do not require medical attention (report to your doctor or health care professional if they continue or are bothersome): ?constipation ?diarrhea ?headache ?joint pain ?loss of appetite ?muscle pain ?runny nose ?tiredness ?upset stomach ?This list may not describe all possible side effects. Call your doctor for medical advice about side effects. You may report side effects to FDA at 1-800-FDA-1088. ?Where should I keep my medication? ?This medicine is only given in a clinic, doctor's office, or other health care setting and will not be stored at home. ?NOTE: This sheet is a summary. It may not cover all possible information. If you have questions about this medicine, talk to your doctor, pharmacist, or health care provider. ?? 2022 Elsevier/Gold Standard (2018-01-30 00:00:00) ? ?

## 2021-12-16 ENCOUNTER — Encounter: Payer: Self-pay | Admitting: Oncology

## 2021-12-18 ENCOUNTER — Telehealth: Payer: Self-pay | Admitting: Dietician

## 2021-12-18 NOTE — Telephone Encounter (Signed)
Nutrition Assessment ? ? ?Reason for Assessment: MST screen for weight loss.  ? ? ?ASSESSMENT:  Patient is 81 year old female with breast cancer diagnosed in 2007, and osteopenia. She is followed by Dr. Hinton Rao. ? ?She reports eating well with good appetite. She did say she has intentionally cut down her portions. ?Usual intake: ?B:Cereal with cranberries, milk and coffee ?L: Sandwiches and fruit ?D: Homemade beef vegetable soup and corn bread (she made a roast over weekend and used that beef) ?HS snack: orange ? ?Takes a calcium w/vitamin D and MVI everyday. Reviewed labs no nutritional concerns.  ?She is eating a varied diet and weight loss has been safe. She is obese and some weight loss may be beneficial. Cautioned against too much too soon and advised to call if she loses more than 5 pounds in month. ? ?Anthropometrics: She has had gradual weight loss, and has no ideal weight target as a goal. ? ?Height: 62" ?Weight:  ?12/11/21   164.8# ?06/13/21    177.25# ?09/21/20   182.2# ? ? ?BMI: 30.11 ? ?INTERVENTION: Encouraged weight maintenance and suggested including glass of milk daily. She had no questions or concerns. Provided contact info if she continued to lose more than 5# per month. ? ? ?MONITORING, EVALUATION, GOAL:  PO intake and weight trends ? ? ?Next Visit: PRN at patient or provider request ? ?April Manson, RDN, LDN ?Registered Dietitian, [facility] ?Part Time Remote (Usual office hours: Tuesday-Thursday) ?Cell: [phone]   ?

## 2021-12-20 DIAGNOSIS — C50919 Malignant neoplasm of unspecified site of unspecified female breast: Secondary | ICD-10-CM | POA: Diagnosis not present

## 2021-12-20 DIAGNOSIS — I951 Orthostatic hypotension: Secondary | ICD-10-CM | POA: Diagnosis not present

## 2021-12-20 DIAGNOSIS — R32 Unspecified urinary incontinence: Secondary | ICD-10-CM | POA: Diagnosis not present

## 2021-12-20 DIAGNOSIS — E669 Obesity, unspecified: Secondary | ICD-10-CM | POA: Diagnosis not present

## 2021-12-20 DIAGNOSIS — E785 Hyperlipidemia, unspecified: Secondary | ICD-10-CM | POA: Diagnosis not present

## 2021-12-20 DIAGNOSIS — C7800 Secondary malignant neoplasm of unspecified lung: Secondary | ICD-10-CM | POA: Diagnosis not present

## 2021-12-20 DIAGNOSIS — Z7901 Long term (current) use of anticoagulants: Secondary | ICD-10-CM | POA: Diagnosis not present

## 2021-12-20 DIAGNOSIS — M1990 Unspecified osteoarthritis, unspecified site: Secondary | ICD-10-CM | POA: Diagnosis not present

## 2021-12-20 DIAGNOSIS — E039 Hypothyroidism, unspecified: Secondary | ICD-10-CM | POA: Diagnosis not present

## 2021-12-20 DIAGNOSIS — I4891 Unspecified atrial fibrillation: Secondary | ICD-10-CM | POA: Diagnosis not present

## 2021-12-20 DIAGNOSIS — R110 Nausea: Secondary | ICD-10-CM | POA: Diagnosis not present

## 2021-12-20 DIAGNOSIS — M810 Age-related osteoporosis without current pathological fracture: Secondary | ICD-10-CM | POA: Diagnosis not present

## 2021-12-24 ENCOUNTER — Telehealth: Payer: Self-pay

## 2021-12-24 NOTE — Telephone Encounter (Addendum)
I spoke with Carrie Wilkerson. She is doing well with the Ibrance. She took her 21st tablet today. She has f/u appt tomorrow here tomorrow. She is taking the [place] @ 8am daily without food. She denies missed doses. She admits to a couple episodes of nausea, no emesis. Denies mouth sores, cold, cough, new SOB, rash or fevers. I reminded her of the importance of calling us if she develops temp of 100.4 or higher, day or night. She verbalized understanding. ?Next appt here is 3/22. ?

## 2021-12-25 ENCOUNTER — Encounter: Payer: Self-pay | Admitting: Oncology

## 2021-12-26 ENCOUNTER — Inpatient Hospital Stay: Payer: Medicare HMO

## 2021-12-26 ENCOUNTER — Encounter: Payer: Self-pay | Admitting: Hematology and Oncology

## 2021-12-26 ENCOUNTER — Inpatient Hospital Stay: Payer: Medicare HMO | Admitting: Hematology and Oncology

## 2021-12-26 ENCOUNTER — Other Ambulatory Visit: Payer: Self-pay

## 2021-12-26 DIAGNOSIS — M8589 Other specified disorders of bone density and structure, multiple sites: Secondary | ICD-10-CM

## 2021-12-26 DIAGNOSIS — Z170 Estrogen receptor positive status [ER+]: Secondary | ICD-10-CM

## 2021-12-26 DIAGNOSIS — C50412 Malignant neoplasm of upper-outer quadrant of left female breast: Secondary | ICD-10-CM

## 2021-12-26 DIAGNOSIS — D649 Anemia, unspecified: Secondary | ICD-10-CM | POA: Diagnosis not present

## 2021-12-26 DIAGNOSIS — J910 Malignant pleural effusion: Secondary | ICD-10-CM

## 2021-12-26 LAB — CBC: RBC: 4.15 (ref 3.87–5.11)

## 2021-12-26 LAB — CBC AND DIFFERENTIAL
HCT: 38 (ref 36–46)
Hemoglobin: 12.7 (ref 12.0–16.0)
Neutrophils Absolute: 0.52
Platelets: 95 10*3/uL — AB (ref 150–400)
WBC: 1.3

## 2021-12-26 LAB — COMPREHENSIVE METABOLIC PANEL
Albumin: 3.7 (ref 3.5–5.0)
Calcium: 9.1 (ref 8.7–10.7)

## 2021-12-26 LAB — HEPATIC FUNCTION PANEL
ALT: 29 U/L (ref 7–35)
AST: 28 (ref 13–35)
Alkaline Phosphatase: 51 (ref 25–125)
Bilirubin, Total: 0.6

## 2021-12-26 LAB — BASIC METABOLIC PANEL
BUN: 12 (ref 4–21)
CO2: 25 — AB (ref 13–22)
Chloride: 108 (ref 99–108)
Creatinine: 0.7 (ref 0.5–1.1)
Glucose: 107
Potassium: 4.2 mEq/L (ref 3.5–5.1)
Sodium: 141 (ref 137–147)

## 2021-12-26 NOTE — Assessment & Plan Note (Signed)
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 CT chest from December revealed right middle and lower lobe collapse with large right pleural effusion. No central obstructing mass lesion evident. No obvious right-sided pleural disease although there is a irregular focus of soft tissue attenuation along the medial right upper lobe pleura/right anterior mediastinum. Scattered tiny bilateral pulmonary nodules measuring up to 4 mm. Thoracentesis yielded 1200 cc's, and cytopathology confirmed malignant cells consistent with breast primary, estrogen receptor positive. She has been placed on anastrozole and we have added in oral chemotherapy with palbociclib 125 mg. She had a 2nd thoracentesis in February which yielded 1300 cc. She has completed her first cycle of palbociclib and tolerated well. She will proceed with cycle 2 next week and return to clinic in 4 weeks for repeat evaluation. ?

## 2021-12-26 NOTE — Assessment & Plan Note (Signed)
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 ? ?? ?

## 2021-12-26 NOTE — Progress Notes (Addendum)
?Patient Care Team: ?Ernestene Kiel, MD as PCP - General (Internal Medicine) ?Derwood Kaplan, MD as Consulting Physician (Oncology) ? ?Clinic Day:  01/23/2022 ? ?Referring physician: Ernestene Kiel, MD ? ?ASSESSMENT & PLAN:  ? 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 CT chest from December revealed right middle and lower lobe collapse with large right pleural effusion. No central obstructing mass lesion evident. No obvious right-sided pleural disease although there is a irregular focus of soft tissue attenuation along the medial right upper lobe pleura/right anterior mediastinum. Scattered tiny bilateral pulmonary nodules measuring up to 4 mm. Thoracentesis yielded 1200 cc's, and cytopathology confirmed malignant cells consistent with breast primary, estrogen receptor positive. She has been placed on anastrozole and we have added in oral chemotherapy with palbociclib 125 mg. She had a 2nd thoracentesis in February which yielded 1300 cc. She has completed her first cycle of palbociclib and tolerated well. She will proceed with cycle 2 next week and return to clinic in 4 weeks for repeat evaluation. ? 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   ?  ? ?Other specified disorders of bone density and structure, multiple sites ?Osteopenia, for which she has been receiving denosumab every 6 months, next due in March.  She continues calcium and vitamin-D as recommended.  She will be due for repeat bone density in September 2023. ?  ? ?The patient understands the plans discussed today and is in agreement with them.  She knows to contact our office if she develops concerns prior  to her next appointment. ? ? ? ?Melodye Ped, NP  ?[address] ?[place] ?[place] [address] ?Dept: [phone] ?Dept Fax: [phone]  ? ?No orders of the defined types were placed in this encounter. ?  ? ? ?CHIEF COMPLAINT:  ?CC: An 81 year old female with history of breast cancer here for 4 week evaluation ? ?Current Treatment:  Palbociclib/ Anastrazole ? ?INTERVAL HISTORY:  ?Carrie Wilkerson is here today for repeat clinical assessment. She denies fevers or chills. She denies pain. Her appetite is good. Her weight has been stable. ? ?I have reviewed the past medical history, past surgical history, social history and family history with the patient and they are unchanged from previous note. ? ?ALLERGIES:  is allergic to codeine, sulfa antibiotics, and chlorhexidine. ? ?MEDICATIONS:  ?Current Outpatient Medications  ?Medication Sig Dispense Refill  ? anastrozole (ARIMIDEX) 1 MG tablet Take 1 tablet (1 mg total) by mouth daily. 30 tablet 5  ? apixaban (ELIQUIS) 5 MG TABS tablet Take 1 tablet (5 mg total) by mouth 2 (two) times daily. 180 tablet 3  ? atorvastatin (LIPITOR) 10 MG tablet Take 1 tablet by mouth every other day.  4  ? CALCIUM PO Take 1 tablet by mouth daily.    ? cholecalciferol (VITAMIN D) 1000 UNITS tablet Take 1,000 Units by mouth daily.    ? denosumab (PROLIA) 60 MG/ML SOLN injection Inject 60 mg into the skin every 6 (six) months. Administer in upper arm, thigh, or abdomen    ? Flaxseed, Linseed, OIL Take 1 capsule by mouth daily.    ? glucosamine-chondroitin 500-400 MG tablet Take 1 tablet by mouth 2 (two) times daily.     ?  levothyroxine (SYNTHROID, LEVOTHROID) 75 MCG tablet Take 75 mcg by mouth daily.   7  ? metoprolol tartrate (LOPRESSOR) 25 MG tablet Take 1 tablet (25 mg total) by mouth 2 (two) times daily. 180 tablet 3  ? Multiple Vitamin (MULTIVITAMIN WITH MINERALS) TABS Take 1 tablet by mouth daily.    ? ondansetron  (ZOFRAN-ODT) 4 MG disintegrating tablet Take 4 mg by mouth 2 (two) times daily as needed for nausea/vomiting.    ? palbociclib (IBRANCE) 125 MG tablet Take 1 tablet (125 mg total) by mouth daily. Take for 21 days on, 7 days off, repeat every 28 days. 21 tablet 5  ? ?No current facility-administered medications for this visit.  ? ? ?HISTORY OF PRESENT ILLNESS:  ? ?Oncology History  ?Malignant neoplasm of upper-outer quadrant of left female breast ([place])  ?03/07/2006 Initial Diagnosis  ? Breast cancer, left breast ([place]) ? ?  ?03/17/2006 Cancer Staging  ? Staging form: Breast, AJCC 6th Edition ?- Clinical stage from 03/17/2006: Stage IIIA (T1c, N2a, M0) - Signed by Derwood Kaplan, MD on 12/15/2020 ?Staged by: Managing physician ?Diagnostic confirmation: Positive histology ?Specimen type: Excision ?Histopathologic type: Infiltrating duct carcinoma, NOS ?Tumor size (mm): 15 ?Laterality: Left ?Total positive nodes: 5 ?Histologic grade (G): G1 ?Residual tumor (R): R0 - None ?Stage prefix: Initial diagnosis ?Lymphatic vessel invasion (L): L0 - No lymphatic vessel invasion ?Venous invasion (V): V0 - No venous invasion ?Prognostic indicators: ER/PR pos, HER 2 neg., Rx with AC x 3 mo, then Taxol x 3 mo.,AI x 10 yrs ? ?  ?  ? ? ?REVIEW OF SYSTEMS:  ? ?Constitutional: Denies fevers, chills or abnormal weight loss ?Eyes: Denies blurriness of vision ?Ears, nose, mouth, throat, and face: Denies mucositis or sore throat ?Respiratory: Denies cough, dyspnea or wheezes ?Cardiovascular: Denies palpitation, chest discomfort or lower extremity swelling ?Gastrointestinal:  Denies nausea, heartburn or change in bowel habits ?Skin: Denies abnormal skin rashes ?Lymphatics: Denies new lymphadenopathy or easy bruising ?Neurological:Denies numbness, tingling or new weaknesses ?Behavioral/Psych: Mood is stable, no new changes  ?All other systems were reviewed with the patient and are negative. ? ? ?VITALS:  ?Blood pressure 131/64, pulse 72,  temperature (!) 97.5 ?F (36.4 ?C), temperature source Oral, resp. rate 18, height '5\' 1"'$  (1.549 m), weight 164 lb (74.4 kg), SpO2 95 %.  ?Wt Readings from Last 3 Encounters:  ?01/23/22 162 lb 4.8 oz (73.6 kg)  ?12/26/21 164 lb (74.4 kg)  ?12/11/21 164 lb 9.6 oz (74.7 kg)  ?  ?Body mass index is 30.99 kg/m?. ? ?Performance status (ECOG): 1 - Symptomatic but completely ambulatory ? ?PHYSICAL EXAM:  ? ?GENERAL:alert, no distress and comfortable ?SKIN: skin color, texture, turgor are normal, no rashes or significant lesions ?EYES: normal, Conjunctiva are pink and non-injected, sclera clear ?OROPHARYNX:no exudate, no erythema and lips, buccal mucosa, and tongue normal  ?NECK: supple, thyroid normal size, non-tender, without nodularity ?LYMPH:  no palpable lymphadenopathy in the cervical, axillary or inguinal ?LUNGS: clear to auscultation and percussion with normal breathing effort ?HEART: regular rate & rhythm and no murmurs and no lower extremity edema ?ABDOMEN:abdomen soft, non-tender and normal bowel sounds ?Musculoskeletal:no cyanosis of digits and no clubbing  ?NEURO: alert & oriented x 3 with fluent speech, no focal motor/sensory deficits ? ?LABORATORY DATA:  ?I have reviewed the data as listed ?   ?Component Value Date/Time  ? NA 143 01/23/2022 0000  ? K 3.7 01/23/2022 0000  ? CL 106 01/23/2022 0000  ? CO2 27 (A) 01/23/2022 0000  ?  GLUCOSE 107 (H) 11/03/2018 5883  ? GLUCOSE 90 04/14/2013 0535  ? BUN 16 01/23/2022 0000  ? CREATININE 0.8 01/23/2022 0000  ? CREATININE 0.84 11/03/2018 0907  ? CALCIUM 9.0 01/23/2022 0000  ? PROT 6.7 11/03/2018 0907  ? ALBUMIN 4.1 01/23/2022 0000  ? ALBUMIN 4.0 11/03/2018 0907  ? AST 40 (A) 01/23/2022 0000  ? ALT 36 (A) 01/23/2022 0000  ? ALKPHOS 42 01/23/2022 0000  ? BILITOT 0.3 11/03/2018 0907  ? GFRNONAA 66 11/03/2018 0907  ? GFRAA 76 11/03/2018 0907  ? ? ?No results found for: SPEP, UPEP ? ?Lab Results  ?Component Value Date  ? WBC 2.1 01/23/2022  ? NEUTROABS 0.76 01/23/2022  ? HGB  13.0 01/23/2022  ? HCT 39 01/23/2022  ? MCV 93 11/26/2021  ? PLT 162 01/23/2022  ? ? ?  Chemistry   ?   ?Component Value Date/Time  ? NA 143 01/23/2022 0000  ? K 3.7 01/23/2022 0000  ? CL 106 01/23/2022 0000  ?

## 2021-12-26 NOTE — Assessment & Plan Note (Signed)
Osteopenia, for which she has been receiving denosumab every 6 months, next due in March. ?She continues calcium and vitamin-D as recommended. ?She will be due for repeat bone density in September 2023. ?

## 2022-01-01 ENCOUNTER — Telehealth: Payer: Self-pay

## 2022-01-01 DIAGNOSIS — Z170 Estrogen receptor positive status [ER+]: Secondary | ICD-10-CM

## 2022-01-01 MED ORDER — PALBOCICLIB 125 MG PO TABS: 125.0000 mg | ORAL_TABLET | Freq: Every day | ORAL | 5 refills | Status: DC

## 2022-01-02 ENCOUNTER — Telehealth: Payer: Self-pay

## 2022-01-02 DIAGNOSIS — D702 Other drug-induced agranulocytosis: Secondary | ICD-10-CM | POA: Insufficient documentation

## 2022-01-02 HISTORY — DX: Other drug-induced agranulocytosis: D70.2

## 2022-01-02 NOTE — Telephone Encounter (Signed)
Oral Oncology Pharmacist Encounter ? ?Prescription refill for Ibrance sent to [facility] in error. Patient enrolled in manufacturer assistance and receives medication through [employer]. Prescription redirected to [facility]. ? ?Drema Halon, PharmD ?Hematology/Oncology Clinical Pharmacist ?[facility] ?[phone] ?01/02/2022 9:08 AM ? ? ? ?

## 2022-01-02 NOTE — Telephone Encounter (Signed)
Rx was escribed to [facility] pharmacy, called [facility] and they confirmed rx was received and shipment was setup for shipment with patient today. No new prescriptions are needed. ?

## 2022-01-02 NOTE — Telephone Encounter (Signed)
Gave to [employer]. ?

## 2022-01-02 NOTE — Telephone Encounter (Addendum)
01/04/22 - I spoke with pt, she got her Ibrance yesterday and started then. ? ?Evelena Peat, [employer] representative, spoke with [employer] today and they told her they would overnight her medication to her, so she should get it tomorrow. ? ?I called pt back, and gave her the above message. I asked her to please call me tomorrow, is she doesn't get the [place]. She will start the [place] tomorrow if it arrives. ?

## 2022-01-02 NOTE — Telephone Encounter (Signed)
-----   Message from Derwood Kaplan, MD sent at 01/01/2022  3:22 PM EDT ----- ?Okay, will put in Tylen Leverich's box ?----- Message ----- ?From: Mort Sawyers ?Sent: 01/01/2022  10:00 AM EDT ?To: Derwood Kaplan, MD ? ?I need a new Rx for patients Ibrance, '125mg'$  ? ?Thanks ? ? ?

## 2022-01-04 ENCOUNTER — Encounter: Payer: Self-pay | Admitting: Oncology

## 2022-01-17 ENCOUNTER — Telehealth: Payer: Self-pay

## 2022-01-17 NOTE — Telephone Encounter (Signed)
I spoke with Carrie Wilkerson. Pt reports she has intermittent nausea the last couple of days and 1 episode of diarrhea today. She hasn't taken anything for the nausea, as she states it goes away pretty quick. I encouraged her to try taking a nausea pill 30 min before she takes the Ibrance to avoid nausea. Pt states, "So far, so good", when I asked how she felt she was doing with the Ibrance.  She is still taking the Ibrance @ 8am daily without food. She denies missed doses. She denies mouth sores, cold, new cough, new SOB, rash or fevers. I reminded her of the importance of calling us if she develops temp of 100.4 or higher, day or night. She verbalized understanding. I confirmed next appt, 01/23/22 @ 0900. ?

## 2022-01-22 ENCOUNTER — Other Ambulatory Visit: Payer: Self-pay | Admitting: Oncology

## 2022-01-22 DIAGNOSIS — Z170 Estrogen receptor positive status [ER+]: Secondary | ICD-10-CM

## 2022-01-22 NOTE — Progress Notes (Signed)
?[facility]  ?[address] ?[place],  [place]  [address] ?[phone] ? ?Clinic Day: 01/23/22 ? ?Referring physician: Ernestene Kiel, MD ? ?ASSESSMENT & PLAN:  ? ?1. 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   ?  ?2. Osteopenia, for which she has been receiving denosumab every 6 months, last received in March.  She continues calcium and vitamin-D as recommended.  She will be due for repeat bone density in September 2023. ? ?3. 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 CT chest from December revealed right middle and lower lobe collapse with large right pleural effusion. No central obstructing mass lesion evident. No obvious right-sided pleural disease although there is a irregular focus of soft tissue attenuation along the medial right upper lobe pleura/right anterior mediastinum. Scattered tiny bilateral pulmonary nodules measuring up to 4 mm. Thoracentesis yielded 1200 cc's, and cytopathology confirmed malignant cells consistent with breast primary, estrogen receptor positive. She has been placed on anastrozole and we have added in oral chemotherapy with palbociclib 125 mg. She had a 2nd thoracentesis in February which yielded 1300 cc.  ? ?4.  Leukopenia/neutropenia. Her WBC was low last time at 1.3 with an ANC of 520, and is better this time at 2.1 with ANC of 760.  However I still think we need to hold and reduce dose.  She finished her 2nd cycle today so will hold off for 2 weeks, until I can recheck her CBC. I will then plan to reduce the dose to 100 mg daily for 3 weeks, off for 1 week.   ? ?She has now completed her second cycle of palbociclib 125 mg, and has developed severe neutropenia. I will postpone her next cycle and reduce the dose to 100 mg.  Her  hemoglobin and platelet counts remain normal.  She knows to continues this medication for 21 days on and 7 days off. She will also continue anastrozole 1 mg daily.  She will return in 2 weeks for a CBC.  If her counts are adequate, we will start her third cycle but at 100 mg.  I will see her back in 5 weeks with CBC and comprehensive metabolic profile.  My plans would be to rescan her in June or July.  The patient understands the plans discussed today and is in agreement with them.  She knows to contact our office if she develops concerns prior to her next visit.  ? ?I provided 20 minutes of face-to-face time during this this encounter and > 50% was spent counseling as documented under my assessment and plan.  ? ? ?Derwood Kaplan, MD ?[facility] ?[place] ?[place] [address] ?Dept: [phone] ?Dept Fax: [phone]  ? ? ?CHIEF COMPLAINT:  ?CC: Metastatic breast cancer with right pleural effusion ? ?Current Treatment:  Anastrozole/palbociclib ? ? ?HISTORY OF PRESENT ILLNESS:  ?Carrie Wilkerson is a 81 y.o. 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 She was treated with lumpectomy.  Pathology revealed a 1.5 cm, grade 1, invasive ductal carcinoma with 5 positive nodes.  Estrogen and progesterone receptors were positive and Her-2 Neu negative.  She received adjuvant chemotherapy with Adriamycin/Cytoxan for 3 months, followed by Taxol for  3 months.  She then received adjuvant radiation to the left breast.  She was placed on letrozole in April 2008 and completed 10 years of adjuvant hormonal therapy in April 2018.  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   Repeat bone density in September 2017 revealed  slight improvement in the bone density, with a T-score of -2.1 in the forearm and a T-score of -1.1 in the femur with, so she has continued denosumab every 6 months, in addition to calcium/vitamin D.  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    She had reversal of her colostomy in October 2017.  She has an incisional hernia in the abdomen, which does not bother her, so she has decided against surgical repair.  She has atrial fibrillation and is on apixaban 5 mg twice daily.  Bone density scan in September 2019 revealed improvement in the bone density with a T-score of -1.6 in forearm.  The bone density of the femur had normalized with a T-score of -0.8.  She has continued denosumab every 6 months.  Annual bilateral mammogram in December 2020 did not reveal any evidence of malignancy.  She contracted COVID-19 later in December 2020.  She did not have to be hospitalized and fully recovered.  Bone density from September 2021 revealed mildly worsened osteopenia with a T-score of -2.0 of the left forearm radius, previously -1.6.  Left femur neck is normal at -0.9, previously -0.8.  She continues calcium 1200 mg with vitamin-D daily. ? ?She presented to the emergency department in December 2022 due to shortness of breath that had been ongoing for the past 3 weeks. She was also having some left substernal area pain. ECHO revealed a normal EF between 60-65%. CT chest revealed right middle and lower lobe collapse with large right pleural effusion. There was no central obstructing mass lesion evident, or obvious right-sided pleural disease, although there is a irregular focus of soft tissue attenuation along the medial right upper lobe pleura/right anterior mediastinum. There were scattered tiny bilateral pulmonary nodules measuring up to 4 mm. Thoracentesis was pursued yielding 1200 cc's of pleural fluid. Cytopathology confirmed malignant cells consistent with breast primary. CKAE1/3, GATA-3  and ER positive. HER2 was negative 1+. Estrogen receptor was positive at 90% and progesterone receptor was positive at 20%. Ki67 was <1%. MRI brain was negative for evidence of metastasis. PET imaging from January 26th revealed no hypermetabolic activity to localize active metastatic breast carcinoma. Large right pleural effusion occupies 75% of the right hemithorax. Along the anteromedial margin of the right diaphragm there is hypermetabolic linear thickening of the diaphragm. ? ?INTERVAL HISTORY:  ?I have reviewed her chart and materials related to her cancer extensively and collaborated history with the patient. Summary of oncologic history is as follows: ?Oncology History  ?Malignant neoplasm of upper-outer quadrant of left female breast ([place])  ?03/07/2006 Initial Diagnosis  ? Breast cancer, left breast ([place]) ? ?  ?03/17/2006 Cancer Staging  ? Staging form: Breast, AJCC 6th Edition ?- Clinical stage from 03/17/2006: Stage IIIA (T1c, N2a, M0) - Signed by Derwood Kaplan, MD on 12/15/2020 ?Staged by: Managing physician ?Diagnostic confirmation: Positive histology ?Specimen type: Excision ?Histopathologic type: Infiltrating duct carcinoma, NOS ?Tumor size (mm): 15 ?Laterality: Left ?Total positive nodes: 5 ?Histologic grade (G): G1 ?Residual tumor (R): R0 - None ?  Stage prefix: Initial diagnosis ?Lymphatic vessel invasion (L): L0 - No lymphatic vessel invasion ?Venous invasion (V): V0 - No venous invasion ?Prognostic indicators: ER/PR pos, HER 2 neg., Rx with AC x 3 mo, then Taxol x 3 mo.,AI x 10 yrs ? ?  ? ?Carrie is here for routine follow up and states that she is doing fairly well. She has a malignant right pleural effusion and had a 2nd thoracentesis last month which yielded 1300 cc.  She has not required further thoracentesis.  She has now received 2 cycles of palbociclib 125 mg, which she completed today.  She does complain of decreased appetite and taste.  She also has watering of her eyes.  The biggest  issue now is neutropenia.  Her white count dropped to 1.3 with an [place] of 520 last time and this time is not quite as low with a white count of 2.1 and ANC of 760.  I still want to postpone her next dose and t

## 2022-01-23 ENCOUNTER — Inpatient Hospital Stay: Payer: Medicare HMO | Attending: Oncology | Admitting: Oncology

## 2022-01-23 ENCOUNTER — Encounter: Payer: Self-pay | Admitting: Oncology

## 2022-01-23 ENCOUNTER — Inpatient Hospital Stay: Payer: Medicare HMO

## 2022-01-23 ENCOUNTER — Other Ambulatory Visit: Payer: Self-pay

## 2022-01-23 ENCOUNTER — Other Ambulatory Visit: Payer: Self-pay | Admitting: Oncology

## 2022-01-23 VITALS — BP 133/62 | HR 75 | Temp 97.6°F | Resp 18 | Ht 61.8 in | Wt 162.3 lb

## 2022-01-23 DIAGNOSIS — C50412 Malignant neoplasm of upper-outer quadrant of left female breast: Secondary | ICD-10-CM | POA: Diagnosis not present

## 2022-01-23 DIAGNOSIS — Z170 Estrogen receptor positive status [ER+]: Secondary | ICD-10-CM | POA: Diagnosis not present

## 2022-01-23 DIAGNOSIS — J910 Malignant pleural effusion: Secondary | ICD-10-CM

## 2022-01-23 DIAGNOSIS — D702 Other drug-induced agranulocytosis: Secondary | ICD-10-CM

## 2022-01-23 LAB — CBC: RBC: 4 (ref 3.87–5.11)

## 2022-01-23 LAB — CBC AND DIFFERENTIAL
HCT: 39 (ref 36–46)
Hemoglobin: 13 (ref 12.0–16.0)
Neutrophils Absolute: 0.76
Platelets: 162 10*3/uL (ref 150–400)
WBC: 2.1

## 2022-01-23 LAB — BASIC METABOLIC PANEL
BUN: 16 (ref 4–21)
CO2: 27 — AB (ref 13–22)
Chloride: 106 (ref 99–108)
Creatinine: 0.8 (ref 0.5–1.1)
Glucose: 82
Potassium: 3.7 mEq/L (ref 3.5–5.1)
Sodium: 143 (ref 137–147)

## 2022-01-23 LAB — COMPREHENSIVE METABOLIC PANEL
Albumin: 4.1 (ref 3.5–5.0)
Calcium: 9 (ref 8.7–10.7)

## 2022-01-23 LAB — HEPATIC FUNCTION PANEL
ALT: 36 U/L — AB (ref 7–35)
AST: 40 — AB (ref 13–35)
Alkaline Phosphatase: 42 (ref 25–125)
Bilirubin, Total: 0.7

## 2022-01-23 NOTE — Progress Notes (Signed)
Sent in dose change for Ibrance to [employer]. ?

## 2022-02-02 ENCOUNTER — Encounter: Payer: Self-pay | Admitting: Oncology

## 2022-02-06 ENCOUNTER — Inpatient Hospital Stay: Payer: Medicare HMO | Attending: Oncology

## 2022-02-06 ENCOUNTER — Telehealth: Payer: Self-pay

## 2022-02-06 ENCOUNTER — Other Ambulatory Visit: Payer: Self-pay | Admitting: Hematology and Oncology

## 2022-02-06 ENCOUNTER — Other Ambulatory Visit: Payer: Self-pay | Admitting: Oncology

## 2022-02-06 DIAGNOSIS — Z170 Estrogen receptor positive status [ER+]: Secondary | ICD-10-CM | POA: Insufficient documentation

## 2022-02-06 DIAGNOSIS — C50412 Malignant neoplasm of upper-outer quadrant of left female breast: Secondary | ICD-10-CM | POA: Insufficient documentation

## 2022-02-06 DIAGNOSIS — Z79899 Other long term (current) drug therapy: Secondary | ICD-10-CM | POA: Insufficient documentation

## 2022-02-06 DIAGNOSIS — D649 Anemia, unspecified: Secondary | ICD-10-CM | POA: Diagnosis not present

## 2022-02-06 LAB — CBC AND DIFFERENTIAL
HCT: 38 (ref 36–46)
Hemoglobin: 12.6 (ref 12.0–16.0)
Neutrophils Absolute: 1.34
Platelets: 283 10*3/uL (ref 150–400)
WBC: 3.2

## 2022-02-06 LAB — CBC
MCV: 100 — AB (ref 81–99)
RBC: 3.76 — AB (ref 3.87–5.11)

## 2022-02-06 NOTE — Telephone Encounter (Signed)
-----   Message from Derwood Kaplan, MD sent at 02/06/2022 10:02 AM EDT ----- ?Regarding: LAB ?PLS PRINT HER CBC FROM THIS AM ? ?

## 2022-02-06 NOTE — Progress Notes (Signed)
Patient has received new dose of Ibrance 100 mg and was informed that she may start this tomorrow 05/04 based upon CBC today with ANC 1.34. She verbalized understanding and will keep scheduled appointment with Dr. Hinton Rao. ?

## 2022-02-06 NOTE — Telephone Encounter (Signed)
Lab printed and gave to Dr. Hinton Rao ? ?

## 2022-02-08 ENCOUNTER — Other Ambulatory Visit: Payer: Self-pay

## 2022-02-14 ENCOUNTER — Telehealth: Payer: Self-pay

## 2022-02-14 NOTE — Telephone Encounter (Signed)
I spoke with Mrs Carrie Wilkerson. Pt reports, "I feel better since we decreased the dose". She continues to take the [place] @ 8am daily without food. She denies missed doses. She does have fatigue, but it improves with rest. She is able to complete her ADL's independently. She denies mouth sores, cold, new cough, new SOB, rash or fevers. I reminded her of the importance of calling us if she develops temp of 100.4 or higher, day or night. She verbalized understanding. I confirmed next appt, 02/27/22 @ 1000a. ?

## 2022-02-20 DIAGNOSIS — Z6829 Body mass index (BMI) 29.0-29.9, adult: Secondary | ICD-10-CM | POA: Diagnosis not present

## 2022-02-20 DIAGNOSIS — W5501XA Bitten by cat, initial encounter: Secondary | ICD-10-CM | POA: Diagnosis not present

## 2022-02-20 DIAGNOSIS — J910 Malignant pleural effusion: Secondary | ICD-10-CM | POA: Diagnosis not present

## 2022-02-20 DIAGNOSIS — D6859 Other primary thrombophilia: Secondary | ICD-10-CM | POA: Diagnosis not present

## 2022-02-20 DIAGNOSIS — I480 Paroxysmal atrial fibrillation: Secondary | ICD-10-CM | POA: Diagnosis not present

## 2022-02-20 DIAGNOSIS — R7309 Other abnormal glucose: Secondary | ICD-10-CM | POA: Diagnosis not present

## 2022-02-20 DIAGNOSIS — I10 Essential (primary) hypertension: Secondary | ICD-10-CM | POA: Diagnosis not present

## 2022-02-20 DIAGNOSIS — Z79899 Other long term (current) drug therapy: Secondary | ICD-10-CM | POA: Diagnosis not present

## 2022-02-20 DIAGNOSIS — Z170 Estrogen receptor positive status [ER+]: Secondary | ICD-10-CM | POA: Diagnosis not present

## 2022-02-20 DIAGNOSIS — C50412 Malignant neoplasm of upper-outer quadrant of left female breast: Secondary | ICD-10-CM | POA: Diagnosis not present

## 2022-02-20 DIAGNOSIS — E039 Hypothyroidism, unspecified: Secondary | ICD-10-CM | POA: Diagnosis not present

## 2022-02-20 DIAGNOSIS — E785 Hyperlipidemia, unspecified: Secondary | ICD-10-CM | POA: Diagnosis not present

## 2022-02-26 NOTE — Progress Notes (Signed)
[facility]  [address] [place],  [place]  [address] [phone]  Clinic Day: 02/27/22  Referring physician: Ernestene Kiel, MD  ASSESSMENT & PLAN:   1. 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     2. Osteopenia, for which she has been receiving denosumab every 6 months, last received in March.  She continues calcium and vitamin-D as recommended.  She will be due for repeat bone density in September 2023.  3. 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 CT chest from December revealed right middle and lower lobe collapse with large right pleural effusion. No central obstructing mass lesion evident. No obvious right-sided pleural disease although there is a irregular focus of soft tissue attenuation along the medial right upper lobe pleura/right anterior mediastinum. Scattered tiny bilateral pulmonary nodules measuring up to 4 mm. Thoracentesis yielded 1200 cc's, and cytopathology confirmed malignant cells consistent with breast primary, estrogen receptor positive. She has been placed on anastrozole and we have added in oral chemotherapy with palbociclib 125 mg. She had a 2nd thoracentesis in February which yielded 1300 cc, but has not required any further drainage procedures.  We did have to decrease her palbociclib to 100 mg due to cytopenias.  4.  Leukopenia/neutropenia. Her WBC was down to 1.3 with an ANC of 520, so we held her dose and decreased the palbociclib dose to 100 mg daily for 3 weeks, off for 1 week.  She still has mild neutropenia but improved.   She is now completing her third cycle of palbociclib at 100 mg now, and has developed mild neutropenia. I will add a CA 27.29 as her last one was 123.6 in January.  Her hemoglobin and  platelet counts remain normal.  She knows to continues this medication for 21 days on and 7 days off. She will also continue anastrozole 1 mg daily.  I will see her back in 1 month with CBC and comprehensive metabolic profile.  My plans would be to rescan her in 2 months.  The patient understands the plans discussed today and is in agreement with them.  She knows to contact our office if she develops concerns prior to her next visit.   I provided 20 minutes of face-to-face time during this this encounter and > 50% was spent counseling as documented under my assessment and plan.    Derwood Kaplan, MD [facility] [address] [place] [address] Dept: [phone] Dept Fax: [phone]    CHIEF COMPLAINT:  CC: Metastatic breast cancer with right pleural effusion  Current Treatment:  Anastrozole/palbociclib   HISTORY OF PRESENT ILLNESS:  Carrie Wilkerson is a 81 y.o. 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 She was treated with lumpectomy.  Pathology revealed a 1.5 cm, grade 1, invasive ductal carcinoma with 5 positive nodes.  Estrogen and progesterone receptors were positive and Her-2 Neu negative.  She received adjuvant chemotherapy with Adriamycin/Cytoxan for 3 months, followed by Taxol for 3 months.  She then received adjuvant radiation to the left breast.  She was placed on letrozole in April 2008 and completed 10 years of adjuvant hormonal therapy in April 2018.  Bone density scan in June 2015  revealed significant osteopenia in the forearm, with a T-score of  -2.3, despite being on alendronate, so she was placed on Prolia (denosumab) 60 mg every 6 months.   Repeat bone density in September 2017 revealed slight improvement in the bone density, with a T-score of -2.1 in the forearm and a T-score of -1.1 in the femur with, so she has  continued denosumab every 6 months, in addition to calcium/vitamin D.  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    She had reversal of her colostomy in October 2017.  She has an incisional hernia in the abdomen, which does not bother her, so she has decided against surgical repair.  She has atrial fibrillation and is on apixaban 5 mg twice daily.  Bone density scan in September 2019 revealed improvement in the bone density with a T-score of -1.6 in forearm.  The bone density of the femur had normalized with a T-score of -0.8.  She has continued denosumab every 6 months.  Annual bilateral mammogram in December 2020 did not reveal any evidence of malignancy.  She contracted COVID-19 later in December 2020.  She did not have to be hospitalized and fully recovered.  Bone density from September 2021 revealed mildly worsened osteopenia with a T-score of -2.0 of the left forearm radius, previously -1.6.  Left femur neck is normal at -0.9, previously -0.8.  She continues calcium 1200 mg with vitamin-D daily.  She presented to the emergency department in December 2022 due to shortness of breath that had been ongoing for the past 3 weeks. She was also having some left substernal area pain. ECHO revealed a normal EF between 60-65%. CT chest revealed right middle and lower lobe collapse with large right pleural effusion. There was no central obstructing mass lesion evident, or obvious right-sided pleural disease, although there is a irregular focus of soft tissue attenuation along the medial right upper lobe pleura/right anterior mediastinum. There were scattered tiny bilateral pulmonary nodules measuring up to 4 mm. Thoracentesis was pursued yielding 1200 cc's of pleural fluid. Cytopathology confirmed malignant cells consistent with breast primary. CKAE1/3, GATA-3 and ER positive. HER2 was negative 1+. Estrogen receptor was positive at 90% and progesterone receptor was positive at 20%. Ki67  was <1%. MRI brain was negative for evidence of metastasis. PET imaging from January 26th revealed no hypermetabolic activity to localize active metastatic breast carcinoma. Large right pleural effusion occupies 75% of the right hemithorax. Along the anteromedial margin of the right diaphragm there is hypermetabolic linear thickening of the diaphragm.  INTERVAL HISTORY:  I have reviewed her chart and materials related to her cancer extensively and collaborated history with the patient. Summary of oncologic history is as follows: Oncology History  Malignant neoplasm of upper-outer quadrant of left female breast ([facility])  03/07/2006 Initial Diagnosis   Breast cancer, left breast ([facility])   03/17/2006 Cancer Staging   Staging form: Breast, AJCC 6th Edition - Clinical stage from 03/17/2006: Stage IIIA (T1c, N2a, M0) - Signed by Derwood Kaplan, MD on 12/15/2020 Staged by: Managing physician Diagnostic confirmation: Positive histology Specimen type: Excision Histopathologic type: Infiltrating duct carcinoma, NOS Tumor size (mm): 15 Laterality: Left Total positive nodes: 5 Histologic grade (G): G1 Residual tumor (R): R0 - None Stage prefix: Initial diagnosis Lymphatic vessel invasion (L): L0 - No lymphatic vessel invasion Venous invasion (V): V0 - No venous invasion Prognostic indicators: ER/PR pos, HER 2 neg., Rx with AC x 3 mo, then Taxol x 3 mo.,AI  x 10 yrs    Bayleigh is here for routine follow up and states that she is doing fairly well.  She has not required further thoracentesis.  She has now received 3 cycles of palbociclib but we reduced the dose to 100 mg with this third cycle which she completed today.  The biggest issue has been neutropenia.  Her white count dropped to 1.3 with an [place] of 520 last time and this time is better at 2.3 with an [place] of 990.  Hemoglobin and platelet count remain normal and CMP is unremarkable.  Her CA 27-29 has decreased nicely from 123.6 to 40.4.  She also  continues anastrozole 1 mg daily without issue. She still has mild shortness of breath with exertion, but improved.  Her  appetite is good, but she has lost 3 pounds since her last visit.  She denies fever, chills or other signs of infection.  She denies nausea, vomiting, bowel issues, or abdominal pain.  She denies sore throat, cough, or chest pain.  HISTORY:   Allergies:  Allergies  Allergen Reactions   Codeine     unknown    Sulfa Antibiotics     unknown    Chlorhexidine Rash    Current Medications: Current Outpatient Medications  Medication Sig Dispense Refill   amoxicillin-clavulanate (AUGMENTIN) 875-125 MG tablet SMARTSIG:1 Tablet(s) By Mouth Every 12 Hours     anastrozole (ARIMIDEX) 1 MG tablet Take 1 tablet (1 mg total) by mouth daily. 30 tablet 5   apixaban (ELIQUIS) 5 MG TABS tablet Take 1 tablet (5 mg total) by mouth 2 (two) times daily. 180 tablet 3   atorvastatin (LIPITOR) 10 MG tablet Take 1 tablet by mouth every other day.  4   CALCIUM PO Take 1 tablet by mouth daily.     cholecalciferol (VITAMIN D) 1000 UNITS tablet Take 1,000 Units by mouth daily.     denosumab (PROLIA) 60 MG/ML SOLN injection Inject 60 mg into the skin every 6 (six) months. Administer in upper arm, thigh, or abdomen     Flaxseed, Linseed, OIL Take 1 capsule by mouth daily.     glucosamine-chondroitin 500-400 MG tablet Take 1 tablet by mouth 2 (two) times daily.      levothyroxine (SYNTHROID, LEVOTHROID) 75 MCG tablet Take 75 mcg by mouth daily.   7   metoprolol tartrate (LOPRESSOR) 25 MG tablet Take 1 tablet (25 mg total) by mouth 2 (two) times daily. 180 tablet 3   Multiple Vitamin (MULTIVITAMIN WITH MINERALS) TABS Take 1 tablet by mouth daily.     ondansetron (ZOFRAN-ODT) 4 MG disintegrating tablet Take 4 mg by mouth 2 (two) times daily as needed for nausea/vomiting.     palbociclib (IBRANCE) 100 MG tablet Take 100 mg by mouth daily. Take for 21 days on, 7 days off, repeat every 28 days.     No  current facility-administered medications for this visit.    REVIEW OF SYSTEMS:  Review of Systems  Constitutional:  Negative for appetite change, chills, fatigue, fever and unexpected weight change.  HENT:  Negative.    Eyes: Negative.   Respiratory:  Positive for shortness of breath (with exertion, very mild). Negative for chest tightness, cough, hemoptysis and wheezing.   Cardiovascular: Negative.  Negative for chest pain, leg swelling and palpitations.  Gastrointestinal: Negative.  Negative for abdominal distention, abdominal pain, blood in stool, constipation, diarrhea, nausea and vomiting.  Endocrine: Negative.   Genitourinary: Negative.  Negative for difficulty urinating, dysuria, frequency and hematuria.  Musculoskeletal: Negative.  Negative for arthralgias, back pain, flank pain, gait problem and myalgias.  Skin: Negative.   Neurological: Negative.  Negative for dizziness, extremity weakness, gait problem, headaches, light-headedness, numbness, seizures and speech difficulty.  Hematological: Negative.   Psychiatric/Behavioral: Negative.  Negative for depression and sleep disturbance. The patient is not nervous/anxious.       VITALS:  Blood pressure (!) 145/67, pulse 70, temperature 97.8 F (36.6 C), temperature source Oral, resp. rate 18, height 5' 1.8" (1.57 m), weight 158 lb 9.6 oz (71.9 kg), SpO2 95 %.  Wt Readings from Last 3 Encounters:  02/27/22 158 lb 9.6 oz (71.9 kg)  01/23/22 162 lb 4.8 oz (73.6 kg)  12/26/21 164 lb (74.4 kg)    Body mass index is 29.2 kg/m.  Performance status (ECOG): 1 - Symptomatic but completely ambulatory  PHYSICAL EXAM:  Physical Exam Constitutional:      General: She is not in acute distress.    Appearance: Normal appearance. She is normal weight.  HENT:     Head: Normocephalic and atraumatic.  Eyes:     General: No scleral icterus.    Extraocular Movements: Extraocular movements intact.     Conjunctiva/sclera: Conjunctivae  normal.     Pupils: Pupils are equal, round, and reactive to light.  Cardiovascular:     Rate and Rhythm: Normal rate and regular rhythm.     Pulses: Normal pulses.     Heart sounds: Murmur heard.     Systolic murmur is present with a grade of 2/6.     No friction rub. No gallop.  Pulmonary:     Effort: Pulmonary effort is normal. No respiratory distress.     Breath sounds: Decreased breath sounds (at the right base) present.  Chest:  Breasts:    Left: Inverted nipple present.     Comments: Well healed scar in the lower outer quadrant of the left breast and left axilla. No masses in either breast.  Abdominal:     General: Bowel sounds are normal. There is no distension.     Palpations: Abdomen is soft. There is no hepatomegaly, splenomegaly or mass.     Tenderness: There is no abdominal tenderness.  Musculoskeletal:        General: Normal range of motion.     Cervical back: Normal range of motion and neck supple.     Right lower leg: No edema.     Left lower leg: No edema.  Lymphadenopathy:     Cervical: No cervical adenopathy.  Skin:    General: Skin is warm and dry.  Neurological:     General: No focal deficit present.     Mental Status: She is alert and oriented to person, place, and time. Mental status is at baseline.  Psychiatric:        Mood and Affect: Mood normal.        Behavior: Behavior normal.        Thought Content: Thought content normal.        Judgment: Judgment normal.      LABS:      Latest Ref Rng & Units 02/27/2022   12:00 AM 02/06/2022   12:00 AM 01/23/2022   12:00 AM  CBC  WBC  2.3     3.2     2.1      Hemoglobin 12.0 - 16.0 12.6     12.6     13.0      Hematocrit 36 - 46 39  38     39      Platelets 150 - 400 K/uL 158     283     162         This result is from an external source.      Latest Ref Rng & Units 02/27/2022   12:00 AM 01/23/2022   12:00 AM 12/26/2021   12:00 AM  CMP  BUN 4 - 21 15     16     12       Creatinine 0.5 - 1.1 0.7      0.8     0.7      Sodium 137 - 147 141     143     141      Potassium 3.5 - 5.1 mEq/L 4.2     3.7     4.2      Chloride 99 - 108 105     106     108      CO2 13 - 22 25     27     25       Calcium 8.7 - 10.7 9.2     9.0     9.1      Alkaline Phos 25 - 125 42     42     51      AST 13 - 35 28     40     28      ALT 7 - 35 U/L 20     36     29         This result is from an external source.    STUDIES:  No results found.

## 2022-02-27 ENCOUNTER — Inpatient Hospital Stay: Payer: Medicare HMO | Admitting: Oncology

## 2022-02-27 ENCOUNTER — Inpatient Hospital Stay: Payer: Medicare HMO

## 2022-02-27 ENCOUNTER — Telehealth: Payer: Self-pay | Admitting: Oncology

## 2022-02-27 ENCOUNTER — Encounter: Payer: Self-pay | Admitting: Oncology

## 2022-02-27 ENCOUNTER — Other Ambulatory Visit: Payer: Self-pay | Admitting: Hematology and Oncology

## 2022-02-27 ENCOUNTER — Other Ambulatory Visit: Payer: Self-pay | Admitting: Oncology

## 2022-02-27 VITALS — BP 145/67 | HR 70 | Temp 97.8°F | Resp 18 | Ht 61.8 in | Wt 158.6 lb

## 2022-02-27 DIAGNOSIS — Z170 Estrogen receptor positive status [ER+]: Secondary | ICD-10-CM

## 2022-02-27 DIAGNOSIS — J910 Malignant pleural effusion: Secondary | ICD-10-CM | POA: Diagnosis not present

## 2022-02-27 DIAGNOSIS — C50412 Malignant neoplasm of upper-outer quadrant of left female breast: Secondary | ICD-10-CM | POA: Diagnosis not present

## 2022-02-27 DIAGNOSIS — D649 Anemia, unspecified: Secondary | ICD-10-CM | POA: Diagnosis not present

## 2022-02-27 DIAGNOSIS — Z79899 Other long term (current) drug therapy: Secondary | ICD-10-CM | POA: Diagnosis not present

## 2022-02-27 LAB — BASIC METABOLIC PANEL
BUN: 15 (ref 4–21)
CO2: 25 — AB (ref 13–22)
Chloride: 105 (ref 99–108)
Creatinine: 0.7 (ref 0.5–1.1)
Glucose: 96
Potassium: 4.2 mEq/L (ref 3.5–5.1)
Sodium: 141 (ref 137–147)

## 2022-02-27 LAB — HEPATIC FUNCTION PANEL
ALT: 20 U/L (ref 7–35)
AST: 28 (ref 13–35)
Alkaline Phosphatase: 42 (ref 25–125)
Bilirubin, Total: 0.7

## 2022-02-27 LAB — COMPREHENSIVE METABOLIC PANEL
Albumin: 4 (ref 3.5–5.0)
Calcium: 9.2 (ref 8.7–10.7)

## 2022-02-27 LAB — CBC AND DIFFERENTIAL
HCT: 39 (ref 36–46)
Hemoglobin: 12.6 (ref 12.0–16.0)
Neutrophils Absolute: 0.99
Platelets: 158 10*3/uL (ref 150–400)
WBC: 2.3

## 2022-02-27 LAB — CBC: RBC: 3.78 — AB (ref 3.87–5.11)

## 2022-02-27 NOTE — Telephone Encounter (Signed)
Per 02/27/22 los next appt scheduled and confirmed with patient 

## 2022-02-28 LAB — CANCER ANTIGEN 27.29: CA 27.29: 40.4 U/mL — ABNORMAL HIGH (ref 0.0–38.6)

## 2022-03-20 NOTE — Progress Notes (Signed)
[facility] [place]  [address] 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 CT chest from December revealed right middle and lower lobe collapse with large right pleural effusion. No central obstructing mass lesion evident. No obvious right-sided pleural disease although there is a irregular focus of soft tissue attenuation along the medial right upper lobe pleura/right anterior mediastinum. Scattered tiny bilateral pulmonary nodules measuring up to 4 mm.  PET was negative.  She required thoracentesis on 2 occasions, the last in January.  Cytology revealed malignant cells consistent with breast primary, ER/PR positive.  She was started on anastrozole 1 mg daily on January 5.  Oral chemotherapy with palbociclib 125 mg daily, for 3 weeks on and 1 week off, was added in February.  After 1 cycle, she developed severe neutropenia. Her 2nd cycle was postponed 1 week.  Prior to a 3rd cycle, she had recurrent neutropenia, so her chemotherapy was held for 2 weeks and the palbociclib dose reduced to 100 mg daily.    She is just now completing her fourth cycle of palbociclib.  She does not have evidence of recurrent pleural effusion.  I will repeat a CBC in 1 week to be sure her counts are adequate prior to proceeding with her 5th cycle of palbociclib.  She knows to continue anastrozole daily.  We will see her back in 5 weeks for CBC, comprehensive metabolic panel, CA 88-32 and CT chest, abdomen and pelvis to reassess her disease baseline prior to a 6th cycle.    Other specified disorders of bone density and structure, multiple sites Osteopenia, for which she has been receiving denosumab every 6 months, next due in September.   She continues calcium and vitamin-D as recommended.  She will be due for repeat bone density in September 2023.  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  She now has recurrent disease.  Drug-induced neutropenia ([place]) She has mild leukopenia, but just completed a cycle of palbociclib.  I will plan to repeat a CBC in 1 week.   The patient understands the plans discussed today and is in agreement with them.  She knows to contact our office if she develops concerns prior to her next appointment.   I provided 20 minutes of face-to-face time during this encounter and > 50% was spent counseling as documented under my assessment and plan.    Carrie Pickles, PA-C  [facility] AT [facility] [address] [place] [address] Dept: [phone] Dept Fax: [phone]   Orders Placed This Encounter  Procedures   CT CHEST ABDOMEN PELVIS W CONTRAST    Standing Status:   Future    Standing Expiration Date:   03/28/2023    Order Specific Question:   Preferred imaging location?    Answer:   External    Comments:   RH    Order Specific Question:   Radiology Contrast Protocol - do NOT remove file path    Answer:   [email or web address]   CBC and differential    This external order was created through the Results  Console.   CBC    This external order was created through the Results Console.   CBC    This order was created through External Result Entry   Basic metabolic panel    This external order was created through the Results Console.   Comprehensive metabolic panel    This external order was created through the Results Console.   Hepatic function panel    This external order was created through  the Results Console.   Cancer antigen 27.29    Standing Status:   Future    Standing Expiration Date:   03/28/2023      CHIEF COMPLAINT:  CC: Recurrent hormone receptor positive breast cancer right pleural effusion  Current Treatment: Anastrozole/palbociclib  HISTORY OF PRESENT ILLNESS:  Carrie Wilkerson is a 81 y.o. 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 She was treated with lumpectomy.  Pathology revealed a 1.5 cm, grade 1, invasive ductal carcinoma with 5 positive nodes.  Estrogen and progesterone receptors were positive and Her-2 Neu negative.  She received adjuvant chemotherapy with Adriamycin/Cytoxan for 3 months, followed by Taxol for 3 months.  She then received adjuvant radiation to the left breast.  She was placed on letrozole in April 2008 and completed 10 years of adjuvant hormonal therapy in April 2018.  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   Repeat bone density in September 2017 revealed slight improvement in the bone density, with a T-score of -2.1 in the forearm and a T-score of -1.1 in the femur with, so she has continued denosumab every 6 months, in addition to calcium/vitamin D.  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    She had reversal of her colostomy in October 2017.  She has an incisional hernia in the abdomen, which does not bother her, so she has decided against surgical repair.  She has atrial fibrillation and is on apixaban 5 mg twice daily.  Bone density scan in September 2019 revealed improvement in the bone density with a T-score of -1.6 in forearm.  The bone density of the femur had normalized with a T-score of -0.8.  She has continued denosumab every 6 months.  Annual bilateral mammogram in  December 2020 did not reveal any evidence of malignancy.  She contracted COVID-19 later in December 2020.  She did not have to be hospitalized and fully recovered.  Bone density from September 2021 revealed mildly worsened osteopenia with a T-score of -2.0 of the left forearm radius, previously -1.6.  Left femur neck is normal at -0.9, previously -0.8.  She continues calcium 1200 mg with vitamin-D daily.   She presented to the emergency department in December 2022 due to shortness of breath that had been ongoing for the past 3 weeks. She was also having some left substernal area pain. ECHO revealed a normal EF between 60-65%. CT chest revealed right middle and lower lobe collapse with large right pleural effusion. There was no central obstructing mass lesion evident, or obvious right-sided pleural disease, although there is a irregular focus of soft tissue attenuation along the medial right upper lobe pleura/right anterior mediastinum. There were scattered tiny bilateral pulmonary nodules measuring up to 4 mm. Thoracentesis was pursued yielding 1200 cc's of pleural fluid.  Cytopathology confirmed malignant cells consistent with breast primary. CKAE1/3, GATA-3 and ER positive. HER2 was negative 1+. Estrogen receptor was positive at 90% and progesterone receptor was positive at 20%. Ki67 was <1%. MRI brain was negative for evidence of metastasis. PET imaging from January 26th revealed no hypermetabolic activity to localize active metastatic breast carcinoma. Large right pleural effusion occupies 75% of the right hemithorax. Along the anteromedial margin of the right diaphragm there is hypermetabolic linear thickening of the diaphragm.  She had a second thoracentesis in January.  She was placed on anastrozole and palbociclib.  She has not required further thoracentesis.  She has completed 4 cycles of palbociclib.  The dose was reduced to 100 mg daily with the third cycle due to neutropenia.  She has had a decrease  in the CA 27-29 from 123.6 to 40.4.     Oncology History  Malignant neoplasm of upper-outer quadrant of left female breast ([place])  03/07/2006 Initial Diagnosis   Breast cancer, left breast ([place])   03/17/2006 Cancer Staging   Staging form: Breast, AJCC 6th Edition - Clinical stage from 03/17/2006: Stage IIIA (T1c, N2a, M0) - Signed by Derwood Kaplan, MD on 12/15/2020 Staged by: Managing physician Diagnostic confirmation: Positive histology Specimen type: Excision Histopathologic type: Infiltrating duct carcinoma, NOS Tumor size (mm): 15 Laterality: Left Total positive nodes: 5 Histologic grade (G): G1 Residual tumor (R): R0 - None Stage prefix: Initial diagnosis Lymphatic vessel invasion (L): L0 - No lymphatic vessel invasion Venous invasion (V): V0 - No venous invasion Prognostic indicators: ER/PR pos, HER 2 neg., Rx with AC x 3 mo, then Taxol x 3 mo.,AI x 10 yrs       INTERVAL HISTORY:  Carrie Wilkerson is here today for repeat clinical assessment prior to a 5th cycle of palbociclib with daily anastrozole.  She states she completes her fourth cycle today, so will have a week off, then will resume palbociclib on June 29.  She states she continues to tolerate treatment without significant difficulty.  She reports occasional skin lesions which feel firm, then open, then heal, mainly of the arms and hands. She denies any changes in her breasts.  She denies worsening shortness of breath concerning for recurrent pleural effusion.  She denies fevers or chills. She denies pain. Her appetite is good. Her weight has decreased 1 pounds over last 4 weeks .  REVIEW OF SYSTEMS:  Review of Systems  Constitutional:  Negative for appetite change, chills, fatigue, fever and unexpected weight change.  HENT:   Negative for lump/mass, mouth sores and sore throat.   Respiratory:  Negative for cough and shortness of breath.   Cardiovascular:  Negative for chest pain and leg swelling.  Gastrointestinal:   Negative for abdominal pain, constipation, diarrhea, nausea and vomiting.  Endocrine: Negative for hot flashes.  Genitourinary:  Negative for difficulty urinating, dysuria, frequency and hematuria.   Musculoskeletal:  Positive for arthralgias (Bilateral knees due to arthritis). Negative for back pain and myalgias.  Skin:  Negative for rash.  Neurological:  Negative for dizziness and headaches.  Hematological:  Negative for adenopathy. Does not bruise/bleed easily.  Psychiatric/Behavioral:  Negative for depression and sleep disturbance. The patient is not nervous/anxious.      VITALS:  Blood pressure (!) 127/59, pulse 71, temperature 97.6 F (36.4 C), temperature source Oral, resp. rate 16, height 5' 1.8" (1.57 m), weight 157 lb 6.4 oz (71.4 kg), SpO2 96 %.  Wt Readings from Last 3 Encounters:  03/27/22 157 lb 6.4 oz (  71.4 kg)  02/27/22 158 lb 9.6 oz (71.9 kg)  01/23/22 162 lb 4.8 oz (73.6 kg)    Body mass index is 28.98 kg/m.  Performance status (ECOG): 1 - Symptomatic but completely ambulatory  PHYSICAL EXAM:  Physical Exam Vitals and nursing note reviewed.  Constitutional:      General: She is not in acute distress.    Appearance: Normal appearance.  HENT:     Head: Normocephalic and atraumatic.     Mouth/Throat:     Mouth: Mucous membranes are moist.     Pharynx: Oropharynx is clear. No oropharyngeal exudate or posterior oropharyngeal erythema.  Eyes:     General: No scleral icterus.    Extraocular Movements: Extraocular movements intact.     Conjunctiva/sclera: Conjunctivae normal.     Pupils: Pupils are equal, round, and reactive to light.  Cardiovascular:     Rate and Rhythm: Normal rate and regular rhythm.     Heart sounds: Murmur heard.     Systolic murmur is present with a grade of 2/6.     No friction rub. No gallop.  Pulmonary:     Effort: Pulmonary effort is normal.     Breath sounds: Examination of the right-lower field reveals decreased breath sounds.  Decreased breath sounds present. No wheezing, rhonchi or rales.  Chest:  Breasts:    Right: Normal. No swelling, bleeding, inverted nipple, mass, nipple discharge, skin change or tenderness.     Left: Normal. No swelling, bleeding, inverted nipple, mass, nipple discharge, skin change or tenderness.  Abdominal:     General: There is no distension.     Palpations: Abdomen is soft. There is no hepatomegaly, splenomegaly or mass.     Tenderness: There is no abdominal tenderness.  Musculoskeletal:        General: Normal range of motion.     Cervical back: Normal range of motion and neck supple. No tenderness.     Right lower leg: No edema.     Left lower leg: No edema.  Lymphadenopathy:     Cervical: No cervical adenopathy.     Upper Body:     Right upper body: No supraclavicular or axillary adenopathy.     Left upper body: No supraclavicular or axillary adenopathy.     Lower Body: No right inguinal adenopathy. No left inguinal adenopathy.  Skin:    General: Skin is warm and dry.     Coloration: Skin is not jaundiced.     Findings: Lesion (Few erythematous lesions of the arms and hands in various stages of healing.) present. No rash.  Neurological:     Mental Status: She is alert and oriented to person, place, and time.     Cranial Nerves: No cranial nerve deficit.  Psychiatric:        Mood and Affect: Mood normal.        Behavior: Behavior normal.        Thought Content: Thought content normal.     LABS:      Latest Ref Rng & Units 03/27/2022   12:00 AM 02/27/2022   12:00 AM 02/06/2022   12:00 AM  CBC  WBC  2.5     2.3     3.2      Hemoglobin 12.0 - 16.0 12.6     12.6     12.6      Hematocrit 36 - 46 37     39     38      Platelets 150 -  400 K/uL 198     158     283         This result is from an external source.      Latest Ref Rng & Units 03/27/2022   12:00 AM 02/27/2022   12:00 AM 01/23/2022   12:00 AM  CMP  BUN 4 - 21 15     15     16       Creatinine 0.5 - 1.1 0.9      0.7     0.8      Sodium 137 - 147 143     141     143      Potassium 3.5 - 5.1 mEq/L 3.9     4.2     3.7      Chloride 99 - 108 106     105     106      CO2 13 - 22 26     25     27       Calcium 8.7 - 10.7 9.1     9.2     9.0      Alkaline Phos 25 - 125 36     42     42      AST 13 - 35 25     28     40      ALT 7 - 35 U/L 15     20     36         This result is from an external source.     Lab Results  Component Value Date   CEA1 1.6 10/11/2021   /  CEA  Date Value Ref Range Status  10/11/2021 1.6 0.0 - 4.7 ng/mL Final    Comment:    (NOTE)                             Nonsmokers          <3.9                             Smokers             <5.6 Roche [employer] Electrochemiluminescence Immunoassay (ECLIA) Values obtained with different assay methods or kits cannot be used interchangeably.  Results cannot be interpreted as absolute evidence of the presence or absence of malignant disease. Performed At: [facility] [place], [place] [id number] Rush Farmer MD IW:[id number]    No results found for: "PSA1" No results found for: "[id number]" No results found for: "CAN125"  No results found for: "TOTALPROTELP", "ALBUMINELP", "A1GS", "A2GS", "BETS", "BETA2SER", "GAMS", "MSPIKE", "SPEI" No results found for: "TIBC", "FERRITIN", "IRONPCTSAT" No results found for: "LDH"  STUDIES:  No results found.    HISTORY:   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 ([place]) 05/27/2016   Breast cancer, left breast ([place]) 03/07/2006   Cancer ([place])    Hx: of breast cancer in 2007   Degenerative arthritis of hip 04/13/2013   Family history of breast cancer 11/09/2021   Family history of malignant neoplasm of digestive organ 11/09/2021   Family history of malignant neoplasm of other genital organs 11/09/2021   History of left breast cancer 06/13/2021   Hypotension 06/13/2021   Incisional hernia, without obstruction or gangrene 10/22/2016    Malignant neoplasm of  upper-outer quadrant of left female breast ([place]) 03/07/2006   Mixed dyslipidemia 11/03/2018   Numbness in right leg    Hx: of   Obesity (BMI 30.0-34.9) 09/21/2020   Other specified disorders of bone density and structure, multiple sites 09/20/2020   Paroxysmal atrial fibrillation ([place]) 02/13/2016   Personal history of COVID-19 09/2019   Pleural effusion, 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   Pneumonia    Hx: of 'a long time ago"   Postoperative examination 02/05/2016    Past Surgical History:  Procedure Laterality Date   APPENDECTOMY     BACK SURGERY     BREAST SURGERY     Hx: of lumpectomy left breast 2007   COLONOSCOPY     Hx: of   DILATION AND CURETTAGE OF UTERUS     TONSILLECTOMY     TOTAL HIP ARTHROPLASTY Right 04/13/2013   Dr Ninfa Linden   TOTAL HIP ARTHROPLASTY Right 04/13/2013   Procedure: RIGHT TOTAL HIP ARTHROPLASTY ANTERIOR APPROACH;  Surgeon: Mcarthur Rossetti, MD;  Location: [facility];  Service: Orthopedics;  Laterality: Right;   TUBAL LIGATION      Family History  Problem Relation Age of Onset   Hypertension Sister    Cancer Sister 50       uterine or ovarian   Bone cancer Sister 36   Breast cancer Sister        dx 64 and 1   Uterine cancer Sister 54   Colon cancer Sister 80   Breast cancer Sister 40   Hypertension Brother    Kidney cancer Brother 37   Cancer Brother 33       unknown type; mets   Liver cancer Brother    Liver cancer Brother 19   Pancreatic cancer Niece 42   Breast cancer Niece 69   Colon cancer Nephew 27       mets    Social History:  reports that she has never smoked. She has never used smokeless tobacco. She reports that she does not drink alcohol and does not use drugs.The patient is alone today.  Allergies:  Allergies  Allergen Reactions   Codeine     unknown    Sulfa Antibiotics     unknown    Chlorhexidine Rash    Current Medications: Current Outpatient Medications  Medication Sig Dispense Refill   palbociclib  (IBRANCE) 100 MG tablet Take 1 tablet (100 mg total) by mouth daily. Take for 21 days on, 7 days off, repeat every 28 days. 21 tablet 5   amoxicillin-clavulanate (AUGMENTIN) 875-125 MG tablet SMARTSIG:1 Tablet(s) By Mouth Every 12 Hours     anastrozole (ARIMIDEX) 1 MG tablet Take 1 tablet (1 mg total) by mouth daily. 30 tablet 5   apixaban (ELIQUIS) 5 MG TABS tablet Take 1 tablet (5 mg total) by mouth 2 (two) times daily. 180 tablet 3   atorvastatin (LIPITOR) 10 MG tablet Take 1 tablet by mouth every other day.  4   CALCIUM PO Take 1 tablet by mouth daily.     cholecalciferol (VITAMIN D) 1000 UNITS tablet Take 1,000 Units by mouth daily.     denosumab (PROLIA) 60 MG/ML SOLN injection Inject 60 mg into the skin every 6 (six) months. Administer in upper arm, thigh, or abdomen     Flaxseed, Linseed, OIL Take 1 capsule by mouth daily.     glucosamine-chondroitin 500-400 MG tablet Take 1 tablet by mouth 2 (two) times daily.      levothyroxine (SYNTHROID, LEVOTHROID) 75 MCG tablet Take 75  mcg by mouth daily.   7   metoprolol tartrate (LOPRESSOR) 25 MG tablet Take 1 tablet (25 mg total) by mouth 2 (two) times daily. 180 tablet 3   Multiple Vitamin (MULTIVITAMIN WITH MINERALS) TABS Take 1 tablet by mouth daily.     ondansetron (ZOFRAN-ODT) 4 MG disintegrating tablet Take 4 mg by mouth 2 (two) times daily as needed for nausea/vomiting.     No current facility-administered medications for this visit.

## 2022-03-20 NOTE — Assessment & Plan Note (Addendum)
Osteopenia, for which she has been receiving denosumab every 6 months, next due in September. She continues calcium and vitamin-D as recommended. She will be due for repeat bone density in September 2023.

## 2022-03-20 NOTE — Assessment & Plan Note (Addendum)
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 CT chest from December revealed right middle and lower lobe collapse with large right pleural effusion. No central obstructing mass lesion evident. No obvious right-sided pleural disease although there is a irregular focus of soft tissue attenuation along the medial right upper lobe pleura/right anterior mediastinum. Scattered tiny bilateral pulmonary nodules measuring up to 4 mm.  PET was negative.  She required thoracentesis on 2 occasions, the last in January.  Cytology revealed malignant cells consistent with breast primary, ER/PR positive.  She was started on anastrozole 1 mg daily on January 5.  Oral chemotherapy with palbociclib 125 mg daily, for 3 weeks on and 1 week off, was added in February.  After 1 cycle, she developed severe neutropenia. Her 2nd cycle was postponed 1 week.  Prior to a 3rd cycle, she had recurrent neutropenia, so her chemotherapy was held for 2 weeks and the palbociclib dose reduced to 100 mg daily.    She is just now completing her fourth cycle of palbociclib.  She does not have evidence of recurrent pleural effusion.  I will repeat a CBC in 1 week to be sure her counts are adequate prior to proceeding with her 5th cycle of palbociclib.  She knows to continue anastrozole daily.  We will see her back in 5 weeks for CBC, comprehensive metabolic panel, CA 88-89 and CT chest, abdomen and pelvis to reassess her disease baseline prior to a 6th cycle.

## 2022-03-22 ENCOUNTER — Encounter: Payer: Self-pay | Admitting: Oncology

## 2022-03-26 NOTE — Assessment & Plan Note (Signed)
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  She now has recurrent disease. 

## 2022-03-27 ENCOUNTER — Inpatient Hospital Stay: Payer: Medicare HMO | Attending: Oncology | Admitting: Hematology and Oncology

## 2022-03-27 ENCOUNTER — Inpatient Hospital Stay: Payer: Medicare HMO

## 2022-03-27 ENCOUNTER — Encounter: Payer: Self-pay | Admitting: Hematology and Oncology

## 2022-03-27 VITALS — BP 127/59 | HR 71 | Temp 97.6°F | Resp 16 | Ht 61.8 in | Wt 157.4 lb

## 2022-03-27 DIAGNOSIS — Z170 Estrogen receptor positive status [ER+]: Secondary | ICD-10-CM | POA: Diagnosis not present

## 2022-03-27 DIAGNOSIS — D702 Other drug-induced agranulocytosis: Secondary | ICD-10-CM

## 2022-03-27 DIAGNOSIS — M8589 Other specified disorders of bone density and structure, multiple sites: Secondary | ICD-10-CM

## 2022-03-27 DIAGNOSIS — C50912 Malignant neoplasm of unspecified site of left female breast: Secondary | ICD-10-CM | POA: Insufficient documentation

## 2022-03-27 DIAGNOSIS — J910 Malignant pleural effusion: Secondary | ICD-10-CM | POA: Insufficient documentation

## 2022-03-27 DIAGNOSIS — C50412 Malignant neoplasm of upper-outer quadrant of left female breast: Secondary | ICD-10-CM | POA: Diagnosis not present

## 2022-03-27 LAB — BASIC METABOLIC PANEL
BUN: 15 (ref 4–21)
CO2: 26 — AB (ref 13–22)
Chloride: 106 (ref 99–108)
Creatinine: 0.9 (ref 0.5–1.1)
Glucose: 81
Potassium: 3.9 mEq/L (ref 3.5–5.1)
Sodium: 143 (ref 137–147)

## 2022-03-27 LAB — CBC AND DIFFERENTIAL
HCT: 37 (ref 36–46)
Hemoglobin: 12.6 (ref 12.0–16.0)
Neutrophils Absolute: 1.05
Platelets: 198 10*3/uL (ref 150–400)
WBC: 2.5

## 2022-03-27 LAB — HEPATIC FUNCTION PANEL
ALT: 15 U/L (ref 7–35)
AST: 25 (ref 13–35)
Alkaline Phosphatase: 36 (ref 25–125)
Bilirubin, Total: 0.6

## 2022-03-27 LAB — COMPREHENSIVE METABOLIC PANEL
Albumin: 4.3 (ref 3.5–5.0)
Calcium: 9.1 (ref 8.7–10.7)

## 2022-03-27 LAB — CBC
MCV: 106 — AB (ref 81–99)
RBC: 3.52 — AB (ref 3.87–5.11)

## 2022-03-27 MED ORDER — PALBOCICLIB 100 MG PO TABS: 100.0000 mg | ORAL_TABLET | Freq: Every day | ORAL | 5 refills | Status: DC

## 2022-03-27 NOTE — Assessment & Plan Note (Signed)
She has mild leukopenia, but just completed a cycle of palbociclib.  I will plan to repeat a CBC in 1 week.

## 2022-03-29 ENCOUNTER — Telehealth: Payer: Self-pay

## 2022-04-01 ENCOUNTER — Telehealth: Payer: Self-pay

## 2022-04-03 ENCOUNTER — Telehealth: Payer: Self-pay

## 2022-04-03 ENCOUNTER — Inpatient Hospital Stay: Payer: Medicare HMO

## 2022-04-03 DIAGNOSIS — J910 Malignant pleural effusion: Secondary | ICD-10-CM

## 2022-04-03 DIAGNOSIS — C50412 Malignant neoplasm of upper-outer quadrant of left female breast: Secondary | ICD-10-CM

## 2022-04-03 DIAGNOSIS — Z170 Estrogen receptor positive status [ER+]: Secondary | ICD-10-CM | POA: Diagnosis not present

## 2022-04-03 DIAGNOSIS — D649 Anemia, unspecified: Secondary | ICD-10-CM | POA: Diagnosis not present

## 2022-04-03 DIAGNOSIS — C50912 Malignant neoplasm of unspecified site of left female breast: Secondary | ICD-10-CM | POA: Diagnosis not present

## 2022-04-03 LAB — COMPREHENSIVE METABOLIC PANEL
Albumin: 4.2 (ref 3.5–5.0)
Calcium: 9.3 (ref 8.7–10.7)

## 2022-04-03 LAB — CBC: RBC: 3.37 — AB (ref 3.87–5.11)

## 2022-04-03 LAB — HEPATIC FUNCTION PANEL
ALT: 16 U/L (ref 7–35)
AST: 25 (ref 13–35)
Alkaline Phosphatase: 36 (ref 25–125)
Bilirubin, Total: 0.5

## 2022-04-03 LAB — BASIC METABOLIC PANEL
BUN: 11 (ref 4–21)
CO2: 27 — AB (ref 13–22)
Chloride: 107 (ref 99–108)
Creatinine: 0.7 (ref 0.5–1.1)
Glucose: 94
Potassium: 3.9 mEq/L (ref 3.5–5.1)
Sodium: 140 (ref 137–147)

## 2022-04-03 LAB — CBC AND DIFFERENTIAL
HCT: 36 (ref 36–46)
Hemoglobin: 12.2 (ref 12.0–16.0)
Neutrophils Absolute: 0.76
Platelets: 173 10*3/uL (ref 150–400)
WBC: 2.3

## 2022-04-03 NOTE — Telephone Encounter (Signed)
-----   Message from Derwood Kaplan, MD sent at 03/22/2022  7:59 PM EDT ----- Regarding: call Tell her Cancer test in the blood has come down nicely from 123 to 40, so treatment working.

## 2022-04-03 NOTE — Telephone Encounter (Signed)
Patient notified

## 2022-04-03 NOTE — Telephone Encounter (Signed)
Per Melissa patient is to hold her medication another week due to counts still being low. Recheck labs next week. Patient called and notified. Message sent to scheduling.

## 2022-04-04 ENCOUNTER — Telehealth: Payer: Self-pay | Admitting: Oncology

## 2022-04-04 LAB — CANCER ANTIGEN 27.29: CA 27.29: 36.4 U/mL (ref 0.0–38.6)

## 2022-04-04 NOTE — Telephone Encounter (Signed)
CT C/A/P has been scheduled for 04/29/22 @ 11:30 am ;  Checking in at 11 am  Notified pt of date,time and instructions.

## 2022-04-10 ENCOUNTER — Inpatient Hospital Stay: Payer: Medicare HMO | Attending: Oncology

## 2022-04-10 DIAGNOSIS — C50412 Malignant neoplasm of upper-outer quadrant of left female breast: Secondary | ICD-10-CM

## 2022-04-10 DIAGNOSIS — D649 Anemia, unspecified: Secondary | ICD-10-CM | POA: Diagnosis not present

## 2022-04-10 LAB — HEPATIC FUNCTION PANEL
ALT: 18 U/L (ref 7–35)
AST: 25 (ref 13–35)
Alkaline Phosphatase: 38 (ref 25–125)
Bilirubin, Total: 0.5

## 2022-04-10 LAB — CBC: RBC: 3.39 — AB (ref 3.87–5.11)

## 2022-04-10 LAB — BASIC METABOLIC PANEL
BUN: 12 (ref 4–21)
CO2: 26 — AB (ref 13–22)
Chloride: 108 (ref 99–108)
Creatinine: 0.6 (ref 0.5–1.1)
Glucose: 97
Potassium: 4.1 mEq/L (ref 3.5–5.1)
Sodium: 141 (ref 137–147)

## 2022-04-10 LAB — CBC AND DIFFERENTIAL
HCT: 36 (ref 36–46)
Hemoglobin: 12.2 (ref 12.0–16.0)
Neutrophils Absolute: 1.29
Platelets: 284 10*3/uL (ref 150–400)
WBC: 3.4

## 2022-04-10 LAB — COMPREHENSIVE METABOLIC PANEL
Albumin: 4 (ref 3.5–5.0)
Calcium: 9.6 (ref 8.7–10.7)

## 2022-04-11 ENCOUNTER — Other Ambulatory Visit: Payer: Self-pay | Admitting: Hematology and Oncology

## 2022-04-11 ENCOUNTER — Other Ambulatory Visit (HOSPITAL_COMMUNITY): Payer: Self-pay

## 2022-04-11 MED ORDER — PALBOCICLIB 75 MG PO CAPS
75.0000 mg | ORAL_CAPSULE | Freq: Every day | ORAL | 3 refills | Status: DC
Start: 2022-04-11 — End: 2022-04-12
  Filled 2022-04-11: qty 21, 21d supply, fill #0

## 2022-04-12 ENCOUNTER — Telehealth: Payer: Self-pay

## 2022-04-12 DIAGNOSIS — Z170 Estrogen receptor positive status [ER+]: Secondary | ICD-10-CM

## 2022-04-12 MED ORDER — PALBOCICLIB 75 MG PO CAPS: 75.0000 mg | ORAL_CAPSULE | Freq: Every day | ORAL | 3 refills | Status: DC

## 2022-04-12 NOTE — Telephone Encounter (Signed)
Oral Oncology Pharmacist Encounter  Prescription refill for Ibrance sent to [facility] in error. Patient enrolled in manufacturer assistance and receives medication through [facility] oncology. Prescription redirected to [facility].  Drema Halon, PharmD Hematology/Oncology Clinical Pharmacist [facility] [phone] 04/12/2022 9:29 AM

## 2022-04-15 ENCOUNTER — Other Ambulatory Visit (HOSPITAL_COMMUNITY): Payer: Self-pay

## 2022-04-17 ENCOUNTER — Inpatient Hospital Stay: Payer: Medicare HMO

## 2022-04-17 ENCOUNTER — Telehealth: Payer: Self-pay

## 2022-04-17 DIAGNOSIS — Z170 Estrogen receptor positive status [ER+]: Secondary | ICD-10-CM | POA: Diagnosis not present

## 2022-04-17 DIAGNOSIS — C50412 Malignant neoplasm of upper-outer quadrant of left female breast: Secondary | ICD-10-CM

## 2022-04-17 DIAGNOSIS — D649 Anemia, unspecified: Secondary | ICD-10-CM | POA: Diagnosis not present

## 2022-04-17 LAB — CBC AND DIFFERENTIAL
HCT: 38 (ref 36–46)
Hemoglobin: 12.5 (ref 12.0–16.0)
Neutrophils Absolute: 2.3
Platelets: 336 10*3/uL (ref 150–400)
WBC: 4.6

## 2022-04-17 LAB — CBC: RBC: 3.57 — AB (ref 3.87–5.11)

## 2022-04-17 NOTE — Telephone Encounter (Addendum)
Pt notified she can restart her [facility] today or tomorrow. She states, "I take it a 8am, so I'll start in the morning". ----- Message from Melodye Ped, NP sent at 04/17/2022  4:17 PM EDT ----- Regarding: RE: CBC She is now good to restart. ANC 2.3 ----- Message ----- From: Dairl Ponder, RN Sent: 04/17/2022   3:48 PM EDT To: Derwood Kaplan, MD; Melodye Ped, NP; # Subject: FW: CBC                                        Did we Marchia Meiers? ----- Message ----- From: Derwood Kaplan, MD Sent: 04/17/2022  12:00 AM EDT To: Derwood Kaplan, MD; Belva Chimes, LPN; # Subject: CBC                                            CBC today to see if can start her [facility]

## 2022-04-18 ENCOUNTER — Other Ambulatory Visit: Payer: Self-pay

## 2022-04-18 DIAGNOSIS — Z170 Estrogen receptor positive status [ER+]: Secondary | ICD-10-CM

## 2022-04-18 MED ORDER — ANASTROZOLE 1 MG PO TABS: 1.0000 mg | ORAL_TABLET | Freq: Every day | ORAL | 3 refills | Status: DC

## 2022-04-22 ENCOUNTER — Other Ambulatory Visit: Payer: Self-pay

## 2022-04-22 DIAGNOSIS — C801 Malignant (primary) neoplasm, unspecified: Secondary | ICD-10-CM | POA: Insufficient documentation

## 2022-04-24 ENCOUNTER — Encounter: Payer: Self-pay | Admitting: Cardiology

## 2022-04-24 ENCOUNTER — Ambulatory Visit: Payer: Medicare HMO | Admitting: Cardiology

## 2022-04-24 VITALS — BP 118/56 | HR 86 | Ht 62.0 in | Wt 156.2 lb

## 2022-04-24 DIAGNOSIS — I480 Paroxysmal atrial fibrillation: Secondary | ICD-10-CM | POA: Diagnosis not present

## 2022-04-24 MED ORDER — APIXABAN 5 MG PO TABS: 5.0000 mg | ORAL_TABLET | Freq: Two times a day (BID) | ORAL | 3 refills | Status: DC

## 2022-04-24 NOTE — Patient Instructions (Signed)

## 2022-04-24 NOTE — Progress Notes (Signed)
Cardiology Office Note:    Date:  04/24/2022   ID:  Carrie Wilkerson, DOB 1941-02-07, MRN [id number]  PCP:  Carrie Kiel, MD  Cardiologist:  Carrie Lindau, MD   Referring MD: Carrie Kiel, MD    ASSESSMENT:    1. Paroxysmal atrial fibrillation (HCC)    PLAN:    In order of problems listed above:  Paroxysmal atrial fibrillation:I discussed with the patient atrial fibrillation, disease process. Management and therapy including rate and rhythm control, anticoagulation benefits and potential risks were discussed extensively with the patient. Patient had multiple questions which were answered to patient's satisfaction. History of breast cancer: Followed by our oncology colleagues.  Patient has had a history of pleural effusion in the past but she tells me things are stable at this time.  Clinically her lungs appear to have good air entry bilaterally today.  She has no symptoms of shortness of breath or such issues and is happy about it.  These issues will be followed by her oncologist and primary care. Patient will be seen in follow-up appointment in 9 months or earlier if the patient has any concerns.   Medication Adjustments/Labs and Tests Ordered: Current medicines are reviewed at length with the patient today.  Concerns regarding medicines are outlined above.  No orders of the defined types were placed in this encounter.  Meds ordered this encounter  Medications   apixaban (ELIQUIS) 5 MG TABS tablet    Sig: Take 1 tablet (5 mg total) by mouth 2 (two) times daily.    Dispense:  180 tablet    Refill:  3     No chief complaint on file.    History of Present Illness:    Carrie Wilkerson is a 81 y.o. female.  Patient has past medical history of paroxysmal atrial fibrillation and breast cancer.  She is getting chemotherapy for this.  She has had no significant issues with atrial fibrillation.  She has had no palpitations or any such problems.  She  takes care of activities of daily living.  She appears.  Pretty cheerful today.  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  Report date is 11/29/21.   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   Mixed dyslipidemia 11/03/2018   Numbness in right leg    Hx: of   Obesity (BMI 30.0-34.9) 09/21/2020   Other specified disorders of bone density and structure, multiple sites 09/20/2020   Paroxysmal atrial fibrillation ([place]) 02/13/2016   Personal history of COVID-19 09/2019   Pleural effusion, 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   Pneumonia  Hx: of 'a long time ago"   Postoperative examination 02/05/2016    Past Surgical History:  Procedure Laterality Date   APPENDECTOMY     BACK SURGERY     BREAST SURGERY     Hx: of lumpectomy left breast 2007   COLONOSCOPY     Hx: of   DILATION AND CURETTAGE OF UTERUS     TONSILLECTOMY     TOTAL HIP ARTHROPLASTY Right 04/13/2013   Dr Carrie Wilkerson   TOTAL HIP ARTHROPLASTY Right 04/13/2013   Procedure: RIGHT  TOTAL HIP ARTHROPLASTY ANTERIOR APPROACH;  Surgeon: Carrie Rossetti, MD;  Location: [facility];  Service: Orthopedics;  Laterality: Right;   TUBAL LIGATION      Current Medications: Current Meds  Medication Sig   amoxicillin-clavulanate (AUGMENTIN) 875-125 MG tablet Take 1 tablet by mouth every 12 (twelve) hours.   anastrozole (ARIMIDEX) 1 MG tablet Take 1 tablet (1 mg total) by mouth daily.   atorvastatin (LIPITOR) 10 MG tablet Take 1 tablet by mouth every other day.   CALCIUM PO Take 1 tablet by mouth daily.   cholecalciferol (VITAMIN D) 1000 UNITS tablet Take 1,000 Units by mouth daily.   denosumab (PROLIA) 60 MG/ML SOLN injection Inject 60 mg into the skin every 6 (six) months. Administer in upper arm, thigh, or abdomen   Flaxseed, Linseed, OIL Take 1 capsule by mouth daily.   glucosamine-chondroitin 500-400 MG tablet Take 1 tablet by mouth 2 (two) times daily.    levothyroxine (SYNTHROID, LEVOTHROID) 75 MCG tablet Take 75 mcg by mouth daily.    metoprolol tartrate (LOPRESSOR) 25 MG tablet Take 1 tablet (25 mg total) by mouth 2 (two) times daily.   Multiple Vitamin (MULTIVITAMIN WITH MINERALS) TABS Take 1 tablet by mouth daily.   ondansetron (ZOFRAN-ODT) 4 MG disintegrating tablet Take 4 mg by mouth 2 (two) times daily as needed for nausea/vomiting.   palbociclib (IBRANCE) 100 MG tablet Take 100 mg by mouth daily. Take for 21 days on, 7 days off, repeat every 28 days.   [DISCONTINUED] apixaban (ELIQUIS) 5 MG TABS tablet Take 1 tablet (5 mg total) by mouth 2 (two) times daily.     Allergies:   Codeine, Sulfa antibiotics, and Chlorhexidine   Social History   Socioeconomic History   Marital status: Married    Spouse name: Not on file   Number of children: Not on file   Years of education: Not on file   Highest education level: Not on file  Occupational History   Not on file  Tobacco Use   Smoking status: Never   Smokeless tobacco: Never  Vaping Use   Vaping Use: Never used   Substance and Sexual Activity   Alcohol use: No   Drug use: No   Sexual activity: Not on file  Other Topics Concern   Not on file  Social History Narrative   Not on file   Social Determinants of Health   Financial Resource Strain: Not on file  Food Insecurity: Not on file  Transportation Needs: Not on file  Physical Activity: Not on file  Stress: Not on file  Social Connections: Not on file     Family History: The patient's family history includes Bone cancer (age of onset: 36) in her sister; Breast cancer in her sister; Breast cancer (age of onset: 95) in her niece; Breast cancer (age of onset: 48) in her sister; Cancer (age of onset: 48) in her sister; Cancer (age of onset: 72) in her brother; Colon cancer (  age of onset: 46) in her nephew; Colon cancer (age of onset: 18) in her sister; Hypertension in her brother and sister; Kidney cancer (age of onset: 15) in her brother; Liver cancer in her brother; Liver cancer (age of onset: 36) in her brother; Pancreatic cancer (age of onset: 70) in her niece; Uterine cancer (age of onset: 33) in her sister.  ROS:   Please see the history of present illness.    All other systems reviewed and are negative.  EKGs/Labs/Other Studies Reviewed:    The following studies were reviewed today: I discussed my findings with the patient at length   Recent Labs: 04/10/2022: ALT 18; BUN 12; Creatinine 0.6; Potassium 4.1; Sodium 141 04/17/2022: Hemoglobin 12.5; Platelets 336  Recent Lipid Panel    Component Value Date/Time   CHOL 172 11/03/2018 0907   TRIG 217 (H) 11/03/2018 0907   HDL 43 11/03/2018 0907   CHOLHDL 4.0 11/03/2018 0907   LDLCALC 86 11/03/2018 0907    Physical Exam:    VS:  BP (!) 118/56   Pulse 86   Ht 5' 2"  (1.575 m)   Wt 156 lb 3.2 oz (70.9 kg)   SpO2 95%   BMI 28.57 kg/m     Wt Readings from Last 3 Encounters:  04/24/22 156 lb 3.2 oz (70.9 kg)  03/27/22 157 lb 6.4 oz (71.4 kg)  02/27/22 158 lb 9.6 oz (71.9 kg)      GEN: Patient is in no acute distress HEENT: Normal NECK: No JVD; No carotid bruits LYMPHATICS: No lymphadenopathy CARDIAC: Hear sounds regular, 2/6 systolic murmur at the apex. RESPIRATORY:  Clear to auscultation without rales, wheezing or rhonchi  ABDOMEN: Soft, non-tender, non-distended MUSCULOSKELETAL:  No edema; No deformity  SKIN: Warm and dry NEUROLOGIC:  Alert and oriented x 3 PSYCHIATRIC:  Normal affect   Signed, Carrie Lindau, MD  04/24/2022 9:48 AM    [place]

## 2022-04-29 DIAGNOSIS — I700 Atherosclerosis of aorta: Secondary | ICD-10-CM | POA: Diagnosis not present

## 2022-04-29 DIAGNOSIS — D259 Leiomyoma of uterus, unspecified: Secondary | ICD-10-CM | POA: Diagnosis not present

## 2022-04-29 DIAGNOSIS — C50412 Malignant neoplasm of upper-outer quadrant of left female breast: Secondary | ICD-10-CM | POA: Diagnosis not present

## 2022-04-29 DIAGNOSIS — C50912 Malignant neoplasm of unspecified site of left female breast: Secondary | ICD-10-CM | POA: Diagnosis not present

## 2022-04-29 DIAGNOSIS — K439 Ventral hernia without obstruction or gangrene: Secondary | ICD-10-CM | POA: Diagnosis not present

## 2022-04-29 DIAGNOSIS — K8020 Calculus of gallbladder without cholecystitis without obstruction: Secondary | ICD-10-CM | POA: Diagnosis not present

## 2022-04-29 DIAGNOSIS — E041 Nontoxic single thyroid nodule: Secondary | ICD-10-CM | POA: Diagnosis not present

## 2022-04-29 DIAGNOSIS — Z170 Estrogen receptor positive status [ER+]: Secondary | ICD-10-CM | POA: Diagnosis not present

## 2022-04-29 DIAGNOSIS — J90 Pleural effusion, not elsewhere classified: Secondary | ICD-10-CM | POA: Diagnosis not present

## 2022-04-29 DIAGNOSIS — J9811 Atelectasis: Secondary | ICD-10-CM | POA: Diagnosis not present

## 2022-04-29 DIAGNOSIS — J910 Malignant pleural effusion: Secondary | ICD-10-CM | POA: Diagnosis not present

## 2022-04-30 ENCOUNTER — Encounter: Payer: Self-pay | Admitting: Oncology

## 2022-05-01 ENCOUNTER — Telehealth: Payer: Self-pay | Admitting: Oncology

## 2022-05-01 ENCOUNTER — Inpatient Hospital Stay: Payer: Medicare HMO | Admitting: Oncology

## 2022-05-01 ENCOUNTER — Encounter: Payer: Self-pay | Admitting: Oncology

## 2022-05-01 ENCOUNTER — Other Ambulatory Visit: Payer: Self-pay | Admitting: Oncology

## 2022-05-01 ENCOUNTER — Inpatient Hospital Stay: Payer: Medicare HMO

## 2022-05-01 VITALS — BP 125/57 | HR 74 | Temp 97.5°F | Resp 19 | Ht 62.0 in | Wt 154.1 lb

## 2022-05-01 DIAGNOSIS — D649 Anemia, unspecified: Secondary | ICD-10-CM | POA: Diagnosis not present

## 2022-05-01 DIAGNOSIS — C50412 Malignant neoplasm of upper-outer quadrant of left female breast: Secondary | ICD-10-CM

## 2022-05-01 DIAGNOSIS — Z170 Estrogen receptor positive status [ER+]: Secondary | ICD-10-CM

## 2022-05-01 DIAGNOSIS — J910 Malignant pleural effusion: Secondary | ICD-10-CM | POA: Diagnosis not present

## 2022-05-01 LAB — COMPREHENSIVE METABOLIC PANEL
Albumin: 4 (ref 3.5–5.0)
Calcium: 9.2 (ref 8.7–10.7)

## 2022-05-01 LAB — HEPATIC FUNCTION PANEL
ALT: 15 U/L (ref 7–35)
AST: 23 (ref 13–35)
Alkaline Phosphatase: 40 (ref 25–125)
Bilirubin, Total: 0.4

## 2022-05-01 LAB — BASIC METABOLIC PANEL
BUN: 17 (ref 4–21)
CO2: 23 — AB (ref 13–22)
Chloride: 107 (ref 99–108)
Creatinine: 0.8 (ref 0.5–1.1)
Glucose: 93
Potassium: 3.9 mEq/L (ref 3.5–5.1)
Sodium: 139 (ref 137–147)

## 2022-05-01 LAB — CBC AND DIFFERENTIAL
HCT: 37 (ref 36–46)
Hemoglobin: 12.2 (ref 12.0–16.0)
Neutrophils Absolute: 1.26
Platelets: 274 10*3/uL (ref 150–400)
WBC: 2.8

## 2022-05-01 LAB — CBC: RBC: 3.52 — AB (ref 3.87–5.11)

## 2022-05-01 NOTE — Progress Notes (Signed)
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 CT chest from December of 2022 revealed right middle and lower lobe collapse with large right pleural effusion. No central obstructing mass lesion evident. No obvious right-sided pleural disease although there is a irregular focus of soft tissue attenuation along the medial right upper lobe pleura/right anterior mediastinum. There were scattered tiny bilateral pulmonary nodules measuring up to 4 mm.  PET was negative.  She had thoracentesis on 2 occasions, the last in January.  Cytology revealed malignant cells consistent with breast primary, ER/PR positive.  She was started on anastrozole 1 mg daily on January 5.  Oral chemotherapy with palbociclib 125 mg daily, for 3 weeks on and 1 week off, was added in February.  After 1 cycle, she developed severe neutropenia. Her 2nd cycle was postponed 1 week.  Prior to a 3rd cycle, she had recurrent neutropenia, so her chemotherapy was held for 2 weeks and the palbociclib dose reduced to 100 mg daily. She is just now completing her fifth cycle of palbociclib, with 7 more days to go, then will be changing to the 75 mg dose due to persistent neutropenia.       Other specified disorders of bone density and structure, multiple sites Osteopenia, for which she has been receiving denosumab every 6 months, next due in September.  She continues calcium and vitamin-D as recommended.  She will be due for repeat bone density in September 2023.   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  She now has recurrent disease.   Drug-induced neutropenia ([place]) She has mild leukopenia/neutropenia, but is still completing a cycle of 100 mg palbociclib.  I will plan to decrease the dose to 75 mg due to multiple delays.     Her latest CT chest, abdomen and pelvis from 04/29/22 shows a mild decrease in the large right pleural effusion and no other sites of disease. We will continue the palbociclib but at reduced dose of 75 mg starting with the August cycle, due to persistent neutropenia. She will continue daily anastrazole. She is tolerating the treatment well and it is controlling her disease. Her CA 27.29 has come down from 123 to 36 over the last 6 months.  She will be due for Prolia injection in September, and also next bone density scan soon. She will return in 2 weeks with CBC and CMP before starting the next cycle. The patient understands the plans discussed today and is in agreement with them.  She knows to contact our office if she develops concerns prior to her next appointment.   I provided 30 minutes of face-to-face time during this encounter and > 50% was spent counseling as documented under my assessment and plan.    Derwood Kaplan, MD  [facility] [address] [place] [address] Dept: [phone] Dept Fax: [phone]     CHIEF COMPLAINT:  CC: Recurrent hormone receptor positive breast cancer right pleural effusion  Current Treatment: Anastrozole/palbociclib  HISTORY OF PRESENT ILLNESS:  Carrie Wilkerson  Antigone Crowell is a 81 y.o. 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 She was treated with lumpectomy.  Pathology revealed a 1.5 cm, grade 1, invasive ductal carcinoma with 5 positive nodes.  Estrogen and progesterone receptors were positive and  Her-2 Neu negative.  She received adjuvant chemotherapy with Adriamycin/Cytoxan for 3 months, followed by Taxol for 3 months.  She then received adjuvant radiation to the left breast.  She was placed on letrozole in April 2008 and completed 10 years of adjuvant hormonal therapy in April 2018.  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   Repeat bone density in September 2017 revealed slight improvement in the bone density, with a T-score of -2.1 in the forearm and a T-score of -1.1 in the femur with, so she has continued denosumab every 6 months, in addition to calcium/vitamin D.  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    She had reversal of her colostomy in October 2017.  She has an incisional hernia in the abdomen, which does not bother her, so she has decided against surgical repair.  She has atrial fibrillation and is on apixaban 5 mg twice daily.  Bone density scan in September 2019 revealed improvement in the bone density with a T-score of -1.6 in forearm.  The bone density of the femur had normalized with a T-score of -0.8.  She has continued denosumab every 6 months.  Annual bilateral mammogram in December 2020 did not reveal any evidence of malignancy.  She contracted COVID-19 later in December 2020.  She did not have to be hospitalized and fully recovered.  Bone density from September 2021 revealed mildly worsened osteopenia with a T-score of -2.0 of the left forearm radius, previously -1.6.  Left femur neck is normal at -0.9, previously -0.8.  She continues calcium 1200 mg with vitamin-D daily.   She presented to the emergency department in December 2022 due to shortness of breath that had been ongoing for the past 3 weeks. She was also having some left substernal area pain. ECHO revealed a normal EF between 60-65%. CT  chest revealed right middle and lower lobe collapse with large right pleural effusion. There was no central obstructing mass lesion evident, or obvious right-sided pleural disease, although there is a irregular focus of soft tissue attenuation along the medial right upper lobe pleura/right anterior mediastinum. There were scattered tiny bilateral pulmonary nodules measuring up to 4 mm. Thoracentesis was pursued yielding 1200 cc's of pleural fluid. Cytopathology confirmed malignant cells consistent with breast primary. CKAE1/3, GATA-3 and ER positive. HER2 was negative 1+. Estrogen receptor was positive at 90% and progesterone receptor was positive at 20%. Ki67 was <1%. MRI brain was negative for evidence of metastasis. PET imaging from January 26th revealed no hypermetabolic activity to localize active metastatic breast carcinoma. Large right pleural effusion occupies 75% of the right hemithorax. Along the anteromedial margin of the right diaphragm there is hypermetabolic linear thickening of the diaphragm.  She had a second thoracentesis in January.  She was placed on anastrozole and palbociclib.  She has not required further thoracentesis. The dose was reduced to 100 mg daily with the third cycle due to neutropenia. We have continued to have delays due to this.  She has had  a decrease in the CA 27-29 from 123.6 to 40.4, and the latest is in the normal range at 36.4 in June.     Oncology History  Malignant neoplasm of upper-outer quadrant of left female breast ([place])  03/07/2006 Initial Diagnosis   Breast cancer, left breast ([place])   03/17/2006 Cancer Staging   Staging form: Breast, AJCC 6th Edition - Clinical stage from 03/17/2006: Stage IIIA (T1c, N2a, M0) - Signed by Derwood Kaplan, MD on 12/15/2020 Staged by: Managing physician Diagnostic confirmation: Positive histology Specimen type: Excision Histopathologic type: Infiltrating duct carcinoma, NOS Tumor size (mm): 15 Laterality: Left Total  positive nodes: 5 Histologic grade (G): G1 Residual tumor (R): R0 - None Stage prefix: Initial diagnosis Lymphatic vessel invasion (L): L0 - No lymphatic vessel invasion Venous invasion (V): V0 - No venous invasion Prognostic indicators: ER/PR pos, HER 2 neg., Rx with AC x 3 mo, then Taxol x 3 mo.,AI x 10 yrs       INTERVAL HISTORY:  [place] is here today for repeat clinical assessment prior to a 6th cycle of palbociclib with daily anastrozole.  She states she completes her fifth cycle in 7 days, so will then have a week off, then will resume palbociclib at a reduced dose of 75 mg.  She states she continues to tolerate treatment without significant difficulty. She had a CT of the chest, abdomen and pelvis this week and it shows a mild decrease in the large right pleural effusion. No new areas of disease are seen. She has incidental findings of right thyroid nodule 1.4 cm, cholelithiasis, and uterine fibroid. She denies any changes in her breasts.  She denies worsening shortness of breath concerning for recurrent pleural effusion.  She denies fevers or chills. She denies pain. Her appetite is good. Her weight has decreased 2 pounds over last 2 weeks .  REVIEW OF SYSTEMS:  Review of Systems  Constitutional:  Negative for appetite change, chills, fatigue, fever and unexpected weight change.  HENT:   Negative for lump/mass, mouth sores and sore throat.   Respiratory:  Negative for cough and shortness of breath.   Cardiovascular:  Negative for chest pain and leg swelling.  Gastrointestinal:  Negative for abdominal pain, constipation, diarrhea, nausea and vomiting.  Endocrine: Negative for hot flashes.  Genitourinary:  Negative for difficulty urinating, dysuria, frequency and hematuria.   Musculoskeletal:  Positive for arthralgias (Bilateral knees due to arthritis). Negative for back pain and myalgias.  Skin:  Negative for rash.  Neurological:  Negative for dizziness and headaches.  Hematological:   Negative for adenopathy. Does not bruise/bleed easily.  Psychiatric/Behavioral:  Negative for depression and sleep disturbance. The patient is not nervous/anxious.      VITALS:  Blood pressure (!) 125/57, pulse 74, temperature (!) 97.5 F (36.4 C), temperature source Oral, resp. rate 19, height _0  (1.575 m), weight 154 lb 1.6 oz (69.9 kg), SpO2 98 %.  Wt Readings from Last 3 Encounters:  05/16/22 152 lb (68.9 kg)  05/01/22 154 lb 1.6 oz (69.9 kg)  04/24/22 156 lb 3.2 oz (70.9 kg)    Body mass index is 28.19 kg/m.  Performance status (ECOG): 1 - Symptomatic but completely ambulatory  PHYSICAL EXAM:  Physical Exam Vitals and nursing note reviewed.  Constitutional:      General: She is not in acute distress.    Appearance: Normal appearance.  HENT:     Head: Normocephalic and atraumatic.     Mouth/Throat:     Mouth: Mucous  membranes are moist.     Pharynx: Oropharynx is clear. No oropharyngeal exudate or posterior oropharyngeal erythema.  Eyes:     General: No scleral icterus.    Extraocular Movements: Extraocular movements intact.     Conjunctiva/sclera: Conjunctivae normal.     Pupils: Pupils are equal, round, and reactive to light.  Cardiovascular:     Rate and Rhythm: Normal rate and regular rhythm.     Heart sounds: Murmur heard.     Systolic murmur is present with a grade of 2/6.     No friction rub. No gallop.  Pulmonary:     Effort: Pulmonary effort is normal.     Breath sounds: Examination of the right-lower field reveals decreased breath sounds. Decreased breath sounds present. No wheezing, rhonchi or rales.  Chest:  Breasts:    Right: Normal. No swelling, bleeding, inverted nipple, mass, nipple discharge, skin change or tenderness.     Left: Normal. No swelling, bleeding, inverted nipple, mass, nipple discharge, skin change or tenderness.  Abdominal:     General: There is no distension.     Palpations: Abdomen is soft. There is no hepatomegaly, splenomegaly  or mass.     Tenderness: There is no abdominal tenderness.  Musculoskeletal:        General: Normal range of motion.     Cervical back: Normal range of motion and neck supple. No tenderness.     Right lower leg: No edema.     Left lower leg: No edema.  Lymphadenopathy:     Cervical: No cervical adenopathy.     Upper Body:     Right upper body: No supraclavicular or axillary adenopathy.     Left upper body: No supraclavicular or axillary adenopathy.     Lower Body: No right inguinal adenopathy. No left inguinal adenopathy.  Skin:    General: Skin is warm and dry.     Coloration: Skin is not jaundiced.     Findings: Lesion (Few erythematous lesions of the arms and hands in various stages of healing.) present. No rash.  Neurological:     Mental Status: She is alert and oriented to person, place, and time.     Cranial Nerves: No cranial nerve deficit.  Psychiatric:        Mood and Affect: Mood normal.        Behavior: Behavior normal.        Thought Content: Thought content normal.     LABS:      Latest Ref Rng & Units 05/23/2022   12:00 AM 05/16/2022   12:00 AM 05/01/2022   12:00 AM  CBC  WBC  3.4     2.7     2.8      Hemoglobin 12.0 - 16.0 12.5     12.5     12.2      Hematocrit 36 - 46 37     38     37      Platelets 150 - 400 K/uL 280     177     274         This result is from an external source.      Latest Ref Rng & Units 05/23/2022   12:00 AM 05/16/2022   12:00 AM 05/01/2022   12:00 AM  CMP  BUN 4 - _0 Creatinine 0.5 - 1.1 0.6     0.8  0.8      Sodium 137 - 147 140     141     139      Potassium 3.5 - 5.1 mEq/L 4.0     3.9     3.9      Chloride 99 - 108 108     106     107      CO2 13 - _0 Calcium 8.7 - 10.7 9.2     9.1     9.2      Alkaline Phos 25 - 125 37     37     40      AST 13 - 35 _1 ALT 7 - 35 U/L _2 This result is from an external source.     Lab Results   Component Value Date   CEA1 1.6 10/11/2021   /  CEA  Date Value Ref Range Status  10/11/2021 1.6 0.0 - 4.7 ng/mL Final    Comment:    (NOTE)                             Nonsmokers          <3.9                             Smokers             <5.6 Roche [employer] Electrochemiluminescence Immunoassay (ECLIA) Values obtained with different assay methods or kits cannot be used interchangeably.  Results cannot be interpreted as absolute evidence of the presence or absence of malignant disease. Performed At: [facility] [place], [place] [id number] Rush Farmer MD IR:[id number]    No results found for: "PSA1" No results found for: "346 021 [id number]" No results found for: "CAN125"  No results found for: "TOTALPROTELP", "ALBUMINELP", "A1GS", "A2GS", "BETS", "BETA2SER", "GAMS", "MSPIKE", "SPEI" No results found for: "TIBC", "FERRITIN", "IRONPCTSAT" No results found for: "LDH"  STUDIES:  No results found.    HISTORY:   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 ([place]) 05/27/2016   Breast cancer, left breast ([place]) 03/07/2006   Cancer ([place])    Hx: of breast cancer in 2007   Degenerative arthritis of hip 04/13/2013   Drug-induced neutropenia ([place]) 01/02/2022   Family history of breast cancer 11/09/2021   Family history of malignant neoplasm of digestive organ 11/09/2021   Family history of malignant neoplasm of other genital organs 11/09/2021   Genetic testing 11/30/2021   Negative hereditary cancer genetic testing: no pathogenic variants detected in [place] +RNAinsight Panel.  Report date is 11/29/21.   The [facility]+RNAinsight panel offered by [facility] includes sequencing and rearrangement analysis for the following 47 genes:  APC, ATM, AXIN2, BARD1, BMPR1A, BRCA1, BRCA2, BRIP1, CDH1, CDK4, CDKN2A, CHEK2, DICER1, EPCAM, GREM1, HOXB13,    History of left breast cancer 06/13/2021   Hypotension 06/13/2021   Incisional  hernia, without obstruction or gangrene 10/22/2016   Malignant neoplasm of upper-outer quadrant of left female breast ([place]) 03/07/2006   Mixed dyslipidemia 11/03/2018   Numbness in right leg  Hx: of   Obesity (BMI 30.0-34.9) 09/21/2020   Other specified disorders of bone density and structure, multiple sites 09/20/2020   Paroxysmal atrial fibrillation ([place]) 02/13/2016   Personal history of COVID-19 09/2019   Pleural effusion, 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   Pneumonia    Hx: of 'a long time ago"   Postoperative examination 02/05/2016    Past Surgical History:  Procedure Laterality Date   APPENDECTOMY     BACK SURGERY     BREAST SURGERY     Hx: of lumpectomy left breast 2007   COLONOSCOPY     Hx: of   DILATION AND CURETTAGE OF UTERUS     TONSILLECTOMY     TOTAL HIP ARTHROPLASTY Right 04/13/2013   Dr Ninfa Linden   TOTAL HIP ARTHROPLASTY Right 04/13/2013   Procedure: RIGHT TOTAL HIP ARTHROPLASTY ANTERIOR APPROACH;  Surgeon: Mcarthur Rossetti, MD;  Location: [facility];  Service: Orthopedics;  Laterality: Right;   TUBAL LIGATION      Family History  Problem Relation Age of Onset   Hypertension Sister    Cancer Sister 61       uterine or ovarian   Bone cancer Sister 75   Breast cancer Sister        dx 67 and 67   Uterine cancer Sister 45   Colon cancer Sister 83   Breast cancer Sister 16   Hypertension Brother    Kidney cancer Brother 12   Cancer Brother 68       unknown type; mets   Liver cancer Brother    Liver cancer Brother 12   Pancreatic cancer Niece 5   Breast cancer Niece 43   Colon cancer Nephew 96       mets    Social History:  reports that she has never smoked. She has never used smokeless tobacco. She reports that she does not drink alcohol and does not use drugs.The patient is alone today.  Allergies:  Allergies  Allergen Reactions   Codeine     unknown    Sulfa Antibiotics     unknown    Chlorhexidine Rash    Current Medications: Current Outpatient  Medications  Medication Sig Dispense Refill   anastrozole (ARIMIDEX) 1 MG tablet Take 1 tablet (1 mg total) by mouth daily. 90 tablet 3   apixaban (ELIQUIS) 5 MG TABS tablet Take 1 tablet (5 mg total) by mouth 2 (two) times daily. 180 tablet 3   atorvastatin (LIPITOR) 10 MG tablet Take 1 tablet by mouth every other day.  4   CALCIUM PO Take 1 tablet by mouth daily.     cholecalciferol (VITAMIN D) 1000 UNITS tablet Take 1,000 Units by mouth daily.     denosumab (PROLIA) 60 MG/ML SOLN injection Inject 60 mg into the skin every 6 (six) months. Administer in upper arm, thigh, or abdomen     Flaxseed, Linseed, OIL Take 1 capsule by mouth daily.     glucosamine-chondroitin 500-400 MG tablet Take 1 tablet by mouth 2 (two) times daily.      levothyroxine (SYNTHROID, LEVOTHROID) 75 MCG tablet Take 75 mcg by mouth daily.   7   metoprolol tartrate (LOPRESSOR) 25 MG tablet Take 1 tablet (25 mg total) by mouth 2 (two) times daily. 180 tablet 3   Multiple Vitamin (MULTIVITAMIN WITH MINERALS) TABS Take 1 tablet by mouth daily.     ondansetron (ZOFRAN-ODT) 4 MG disintegrating tablet Take 4 mg by mouth 2 (two) times daily as needed for nausea/vomiting.  palbociclib (IBRANCE) 75 MG capsule Take 75 mg by mouth daily with breakfast. Take whole with food. Take for 21 days on, 7 days off, repeat every 28 days.     No current facility-administered medications for this visit.

## 2022-05-01 NOTE — Telephone Encounter (Signed)
Per 05/01/22 los next appt scheduled and confirmed with patient

## 2022-05-16 ENCOUNTER — Inpatient Hospital Stay: Payer: Medicare HMO | Attending: Oncology | Admitting: Hematology and Oncology

## 2022-05-16 ENCOUNTER — Inpatient Hospital Stay: Payer: Medicare HMO

## 2022-05-16 ENCOUNTER — Other Ambulatory Visit: Payer: Medicare HMO

## 2022-05-16 DIAGNOSIS — C50412 Malignant neoplasm of upper-outer quadrant of left female breast: Secondary | ICD-10-CM | POA: Diagnosis not present

## 2022-05-16 DIAGNOSIS — J910 Malignant pleural effusion: Secondary | ICD-10-CM

## 2022-05-16 DIAGNOSIS — Z170 Estrogen receptor positive status [ER+]: Secondary | ICD-10-CM | POA: Diagnosis not present

## 2022-05-16 DIAGNOSIS — M8589 Other specified disorders of bone density and structure, multiple sites: Secondary | ICD-10-CM

## 2022-05-16 LAB — CBC AND DIFFERENTIAL
HCT: 38 (ref 36–46)
Hemoglobin: 12.5 (ref 12.0–16.0)
Neutrophils Absolute: 1
Platelets: 177 10*3/uL (ref 150–400)
WBC: 2.7

## 2022-05-16 LAB — BASIC METABOLIC PANEL
BUN: 21 (ref 4–21)
CO2: 29 — AB (ref 13–22)
Chloride: 106 (ref 99–108)
Creatinine: 0.8 (ref 0.5–1.1)
Glucose: 111
Potassium: 3.9 mEq/L (ref 3.5–5.1)
Sodium: 141 (ref 137–147)

## 2022-05-16 LAB — HEPATIC FUNCTION PANEL
ALT: 14 U/L (ref 7–35)
AST: 23 (ref 13–35)
Alkaline Phosphatase: 37 (ref 25–125)
Bilirubin, Total: 0.3

## 2022-05-16 LAB — CBC
MCV: 104 — AB (ref 81–99)
RBC: 3.62 — AB (ref 3.87–5.11)

## 2022-05-16 LAB — COMPREHENSIVE METABOLIC PANEL
Albumin: 3.9 (ref 3.5–5.0)
Calcium: 9.1 (ref 8.7–10.7)

## 2022-05-16 NOTE — Progress Notes (Signed)
[facility]  [address] [place],  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 CT chest in December revealed right middle and lower lobe collapse with large right pleural effusion. No central obstructing mass lesion evident. No obvious right-sided pleural disease although there is a irregular focus of soft tissue attenuation along the medial right upper lobe pleura/right anterior mediastinum. Scattered tiny bilateral pulmonary nodules measuring up to 4 mm.  PET was negative.  She required thoracentesis on 2 occasions, the last in January.  Cytology revealed malignant cells consistent with breast primary, ER/PR positive.  She was started on anastrozole 1 mg daily on January 5.  Oral chemotherapy with palbociclib 125 mg daily, for 3 weeks on and 1 week off, was added in February.  After 1 cycle, she developed severe neutropenia. Her 2nd cycle was postponed 1 week.  Prior to a 3rd cycle, she had recurrent neutropenia, so her chemotherapy was held for 2 weeks and the palbociclib dose reduced to 100 mg daily.  She had persistent neutropenia causing delay in her 5th cycle.  CT chest, abdomen and pelvis on July 24 revealed a decrease in the large right pleural effusion and compressive right lung atelectasis.  No new or progressive disease was seen within the chest, abdomen or pelvis.  She is due to start a 6th cycle of palbociclib at a reduced dose of 75 mg daily due to previous neutropenia.  Unfortunately, she has recurrent neutropenia.  I will need to delay this cycle of palbociclib.  I will repeat a CBC in 1 week to be sure her counts are adequate prior to proceeding with her 6th cycle of palbociclib.  She  knows to continue anastrozole daily.  We will plan to see her back in 5 weeks with a CBC and comprehensive panel prior to his 7th cycle of palbociclib.    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  She now has recurrent disease.  Other specified disorders of bone density and structure, multiple sites Osteopenia, for which she has been receiving denosumab every 6 months, next due in September.  She continues calcium and vitamin-D as recommended.  She will be due for repeat bone density in September, which I will schedule.  We will hold her next dose of denosumab until after her bone density scan.    The patient understands the plans discussed today and is in agreement with them.  She knows to contact our office if she develops concerns prior to her next appointment.   I provided 15 minutes of face-to-face time during this encounter and > 50% was spent counseling as documented under my assessment and plan.    [name], PA-C  [facility] AT [facility] [address] [place] [address] Dept: [phone] Dept Fax: [phone]   Orders Placed This Encounter  Procedures   DG Bone Density    Standing Status:   Future    Standing Expiration Date:   05/19/2023    Scheduling Instructions:     RH    Order Specific Question:  Reason for Exam (SYMPTOM  OR DIAGNOSIS REQUIRED)    Answer:   osteopenia    Order Specific Question:   Preferred imaging location?    Answer:   External   CBC and differential    This external order was created through the Results Console.   CBC    This external order was created through the Results Console.   Basic metabolic panel    This external order was created through the Results Console.    Comprehensive metabolic panel    This external order was created through the Results Console.   Hepatic function panel    This external order was created through the Results Console.   CBC    This order was created through External Result Entry      CHIEF COMPLAINT:  CC: Recurrent hormone receptor positive breast cancer, as well as osteoporosis  Current Treatment: Palbociclib/anastrozole with denosumab every 6 months  HISTORY OF PRESENT ILLNESS:   Oncology History  Malignant neoplasm of upper-outer quadrant of left female breast ([place])  03/07/2006 Initial Diagnosis   Breast cancer, left breast ([place])   03/17/2006 Cancer Staging   Staging form: Breast, AJCC 6th Edition - Clinical stage from 03/17/2006: Stage IIIA (T1c, N2a, M0) - Signed by Derwood Kaplan, MD on 12/15/2020 Staged by: Managing physician Diagnostic confirmation: Positive histology Specimen type: Excision Histopathologic type: Infiltrating duct carcinoma, NOS Tumor size (mm): 15 Laterality: Left Total positive nodes: 5 Histologic grade (G): G1 Residual tumor (R): R0 - None Stage prefix: Initial diagnosis Lymphatic vessel invasion (L): L0 - No lymphatic vessel invasion Venous invasion (V): V0 - No venous invasion Prognostic indicators: ER/PR pos, HER 2 neg., Rx with AC x 3 mo, then Taxol x 3 mo.,AI x 10 yrs       INTERVAL HISTORY:  Carrie Wilkerson is here today for repeat clinical assessment prior to a 6 cycle of palbociclib, which will be given at a reduced dose of 75 mg daily due to persistent delays in therapy due to neutropenia.  She states she continues anastrozole daily without difficulty.  She denies side effects associated with palbociclib.  She denies fevers or chills. She denies pain. Her appetite is good. Her weight has decreased 2 pounds over last 3 weeks .  REVIEW OF SYSTEMS:  Review of Systems  Constitutional:  Negative for appetite change, chills, fatigue, fever and unexpected weight change.  HENT:    Negative for lump/mass, mouth sores and sore throat.   Respiratory:  Negative for cough and shortness of breath.   Cardiovascular:  Negative for chest pain and leg swelling.  Gastrointestinal:  Negative for abdominal pain, constipation, diarrhea, nausea and vomiting.  Endocrine: Negative for hot flashes.  Genitourinary:  Negative for difficulty urinating, dysuria, frequency and hematuria.   Musculoskeletal:  Negative for arthralgias, back pain and myalgias.  Skin:  Negative for rash.  Neurological:  Negative for dizziness and headaches.  Hematological:  Negative for adenopathy. Does not bruise/bleed easily.  Psychiatric/Behavioral:  Negative for depression and sleep disturbance. The patient is not nervous/anxious.      VITALS:  Blood pressure (!) 121/57, pulse 82, temperature (!) 97.5 F (36.4 C), temperature source Oral, resp. rate 18, height 5' 2"  (1.575 m), weight 152 lb (68.9 kg), SpO2 98 %.  Wt Readings from Last 3 Encounters:  05/16/22 152 lb (68.9 kg)  05/01/22 154 lb 1.6 oz (69.9 kg)  04/24/22 156 lb 3.2 oz (70.9 kg)    Body mass index is 27.8 kg/m.  Performance status (ECOG): 0 - Asymptomatic  PHYSICAL EXAM:  Physical Exam Vitals and nursing note reviewed.  Constitutional:      General: She is not in acute distress.    Appearance: Normal appearance.  HENT:     Head: Normocephalic and atraumatic.     Mouth/Throat:     Mouth: Mucous membranes are moist.     Pharynx: Oropharynx is clear. No oropharyngeal exudate or posterior oropharyngeal erythema.  Eyes:     General: No scleral icterus.    Extraocular Movements: Extraocular movements intact.     Conjunctiva/sclera: Conjunctivae normal.     Pupils: Pupils are equal, round, and reactive to light.  Cardiovascular:     Rate and Rhythm: Normal rate and regular rhythm.     Heart sounds: Normal heart sounds. No murmur heard.    No friction rub. No gallop.  Pulmonary:     Effort: Pulmonary effort is normal.     Breath  sounds: Normal breath sounds. No wheezing, rhonchi or rales.  Chest:     Chest wall: No mass.  Breasts:    Right: Absent.     Left: Absent.     Comments: Bilateral mastectomy sites are negative Abdominal:     General: There is no distension.     Palpations: Abdomen is soft. There is no hepatomegaly, splenomegaly or mass.     Tenderness: There is no abdominal tenderness.  Musculoskeletal:        General: Normal range of motion.     Cervical back: Normal range of motion and neck supple. No tenderness.     Right lower leg: No edema.     Left lower leg: No edema.  Lymphadenopathy:     Cervical: No cervical adenopathy.     Upper Body:     Right upper body: No supraclavicular or axillary adenopathy.     Left upper body: No supraclavicular or axillary adenopathy.     Lower Body: No right inguinal adenopathy. No left inguinal adenopathy.  Skin:    General: Skin is warm and dry.     Coloration: Skin is not jaundiced.     Findings: No rash.  Neurological:     Mental Status: She is alert and oriented to person, place, and time.     Cranial Nerves: No cranial nerve deficit.  Psychiatric:        Mood and Affect: Mood normal.        Behavior: Behavior normal.        Thought Content: Thought content normal.    LABS:      Latest Ref Rng & Units 05/16/2022   12:00 AM 05/01/2022   12:00 AM 04/17/2022   12:00 AM  CBC  WBC  2.7     2.8     4.6      Hemoglobin 12.0 - 16.0 12.5     12.2     12.5      Hematocrit 36 - 46 38     37     38      Platelets 150 - 400 K/uL 177     274     336         This result is from an external source.      Latest Ref Rng & Units 05/16/2022   12:00 AM 05/01/2022   12:00 AM 04/10/2022   12:00 AM  CMP  BUN 4 - 21 21     17      12  Creatinine 0.5 - 1.1 0.8     0.8     0.6      Sodium 137 - 147 141     139     141      Potassium 3.5 - 5.1 mEq/L 3.9     3.9     4.1      Chloride 99 - 108 106     107     108      CO2 13 - 22 29     23     26       Calcium  8.7 - 10.7 9.1     9.2     9.6      Alkaline Phos 25 - 125 37     40     38      AST 13 - 35 23     23     25       ALT 7 - 35 U/L 14     15     18          This result is from an external source.     Lab Results  Component Value Date   CEA1 1.6 10/11/2021   /  CEA  Date Value Ref Range Status  10/11/2021 1.6 0.0 - 4.7 ng/mL Final    Comment:    (NOTE)                             Nonsmokers          <3.9                             Smokers             <5.6 Roche [employer] Electrochemiluminescence Immunoassay (ECLIA) Values obtained with different assay methods or kits cannot be used interchangeably.  Results cannot be interpreted as absolute evidence of the presence or absence of malignant disease. Performed At: [facility] [place], [place] [id number] Rush Farmer MD XL:[id number]    No results found for: "PSA1" No results found for: "[id number]" No results found for: "CAN125"  No results found for: "TOTALPROTELP", "ALBUMINELP", "A1GS", "A2GS", "BETS", "BETA2SER", "GAMS", "MSPIKE", "SPEI" No results found for: "TIBC", "FERRITIN", "IRONPCTSAT" No results found for: "LDH"  STUDIES:  No results found.    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  Report date is 11/29/21.   The [facility]+RNAinsight panel offered by [facility] includes sequencing and rearrangement analysis  for the following 47 genes:  APC, ATM, AXIN2, BARD1, BMPR1A, BRCA1, BRCA2, BRIP1, CDH1, CDK4, CDKN2A, CHEK2, DICER1, EPCAM, GREM1, HOXB13,    History of left breast cancer 06/13/2021   Hypotension 06/13/2021   Incisional hernia, without obstruction or gangrene 10/22/2016   Malignant neoplasm of upper-outer quadrant of left female breast ([place])  03/07/2006   Mixed dyslipidemia 11/03/2018   Numbness in right leg    Hx: of   Obesity (BMI 30.0-34.9) 09/21/2020   Other specified disorders of bone density and structure, multiple sites 09/20/2020   Paroxysmal atrial fibrillation ([place]) 02/13/2016   Personal history of COVID-19 09/2019   Pleural effusion, 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   Pneumonia    Hx: of 'a long time ago"   Postoperative examination 02/05/2016    Past Surgical History:  Procedure Laterality Date   APPENDECTOMY     BACK SURGERY     BREAST SURGERY     Hx: of lumpectomy left breast 2007   COLONOSCOPY     Hx: of   DILATION AND CURETTAGE OF UTERUS     TONSILLECTOMY     TOTAL HIP ARTHROPLASTY Right 04/13/2013   Dr Ninfa Linden   TOTAL HIP ARTHROPLASTY Right 04/13/2013   Procedure: RIGHT TOTAL HIP ARTHROPLASTY ANTERIOR APPROACH;  Surgeon: Mcarthur Rossetti, MD;  Location: [facility];  Service: Orthopedics;  Laterality: Right;   TUBAL LIGATION      Family History  Problem Relation Age of Onset   Hypertension Sister    Cancer Sister 46       uterine or ovarian   Bone cancer Sister 42   Breast cancer Sister        dx 67 and 69   Uterine cancer Sister 46   Colon cancer Sister 33   Breast cancer Sister 38   Hypertension Brother    Kidney cancer Brother 28   Cancer Brother 56       unknown type; mets   Liver cancer Brother    Liver cancer Brother 6   Pancreatic cancer Niece 49   Breast cancer Niece 41   Colon cancer Nephew 26       mets    Social History:  reports that she has never smoked. She has never used smokeless tobacco. She reports that she does not drink alcohol and does not use  drugs.The patient is alone today.  Allergies:  Allergies  Allergen Reactions   Codeine     unknown    Sulfa Antibiotics     unknown    Chlorhexidine Rash    Current Medications: Current Outpatient Medications  Medication Sig Dispense Refill   anastrozole (ARIMIDEX) 1 MG tablet Take 1 tablet (1 mg total) by mouth daily. 90 tablet 3   apixaban (ELIQUIS) 5 MG TABS tablet Take 1 tablet (5 mg total) by mouth 2 (two) times daily. 180 tablet 3   atorvastatin (LIPITOR) 10 MG tablet Take 1 tablet by mouth every other day.  4   CALCIUM PO Take 1 tablet by mouth daily.     cholecalciferol (VITAMIN D) 1000 UNITS tablet Take 1,000 Units by mouth daily.     denosumab (PROLIA) 60 MG/ML SOLN injection Inject 60 mg into the skin every 6 (six) months. Administer in upper arm, thigh, or abdomen     Flaxseed, Linseed, OIL Take 1 capsule by mouth daily.     glucosamine-chondroitin 500-400 MG tablet Take 1 tablet by mouth 2 (two) times daily.      levothyroxine (SYNTHROID, LEVOTHROID) 75 MCG tablet Take 75 mcg by mouth daily.   7   metoprolol tartrate (LOPRESSOR) 25 MG tablet Take 1 tablet (25 mg total) by mouth 2 (two) times daily. 180 tablet 3   Multiple Vitamin (MULTIVITAMIN WITH MINERALS) TABS Take 1 tablet by mouth daily.     ondansetron (ZOFRAN-ODT) 4 MG disintegrating tablet  Take 4 mg by mouth 2 (two) times daily as needed for nausea/vomiting.     palbociclib (IBRANCE) 100 MG tablet Take 100 mg by mouth daily. Take for 21 days on, 7 days off, repeat every 28 days.     palbociclib (IBRANCE) 75 MG capsule Take 1 capsule (75 mg total) by mouth daily with breakfast. Take whole with food. Take for 21 days on, 7 days off, repeat every 28 days. (Patient not taking: Reported on 04/24/2022) 21 capsule 3   No current facility-administered medications for this visit.

## 2022-05-16 NOTE — Assessment & Plan Note (Addendum)
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 CT chest in December revealed right middle and lower lobe collapse with large right pleural effusion. No central obstructing mass lesion evident. No obvious right-sided pleural disease although there is a irregular focus of soft tissue attenuation along the medial right upper lobe pleura/right anterior mediastinum. Scattered tiny bilateral pulmonary nodules measuring up to 4 mm.  PET was negative.  She required thoracentesis on 2 occasions, the last in January.  Cytology revealed malignant cells consistent with breast primary, ER/PR positive.  She was started on anastrozole 1 mg daily on January 5.  Oral chemotherapy with palbociclib 125 mg daily, for 3 weeks on and 1 week off, was added in February.  After 1 cycle, she developed severe neutropenia. Her 2nd cycle was postponed 1 week.  Prior to a 3rd cycle, she had recurrent neutropenia, so her chemotherapy was held for 2 weeks and the palbociclib dose reduced to 100 mg daily.  She had persistent neutropenia causing delay in her 5th cycle.  CT chest, abdomen and pelvis on July 24 revealed a decrease in the large right pleural effusion and compressive right lung atelectasis.  No new or progressive disease was seen within the chest, abdomen or pelvis.  She is due to start a 6th cycle of palbociclib at a reduced dose of 75 mg daily due to previous neutropenia.  Unfortunately, she has recurrent neutropenia.  I will need to delay this cycle of palbociclib.  I will repeat a CBC in 1 week to be sure her counts are adequate prior to proceeding with her 6th cycle of palbociclib.  She knows to continue anastrozole daily.  We will plan to see her back in 5 weeks with a CBC and comprehensive panel prior to his 7th cycle of palbociclib.

## 2022-05-16 NOTE — Assessment & Plan Note (Addendum)
Osteopenia, for which she has been receiving denosumab every 6 months, next due in September. She continues calcium and vitamin-D as recommended. She will be due for repeat bone density in September, which I will schedule.  We will hold her next dose of denosumab until after her bone density scan.

## 2022-05-16 NOTE — Assessment & Plan Note (Signed)
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  She now has recurrent disease. 

## 2022-05-17 ENCOUNTER — Encounter: Payer: Self-pay | Admitting: Hematology and Oncology

## 2022-05-18 ENCOUNTER — Encounter: Payer: Self-pay | Admitting: Oncology

## 2022-05-23 ENCOUNTER — Telehealth: Payer: Self-pay

## 2022-05-23 ENCOUNTER — Inpatient Hospital Stay: Payer: Medicare HMO

## 2022-05-23 DIAGNOSIS — C50412 Malignant neoplasm of upper-outer quadrant of left female breast: Secondary | ICD-10-CM

## 2022-05-23 DIAGNOSIS — D649 Anemia, unspecified: Secondary | ICD-10-CM | POA: Diagnosis not present

## 2022-05-23 DIAGNOSIS — M8589 Other specified disorders of bone density and structure, multiple sites: Secondary | ICD-10-CM | POA: Diagnosis not present

## 2022-05-23 LAB — CBC AND DIFFERENTIAL
HCT: 37 (ref 36–46)
Hemoglobin: 12.5 (ref 12.0–16.0)
Neutrophils Absolute: 1.43
Platelets: 280 10*3/uL (ref 150–400)
WBC: 3.4

## 2022-05-23 LAB — BASIC METABOLIC PANEL
BUN: 13 (ref 4–21)
CO2: 26 — AB (ref 13–22)
Chloride: 108 (ref 99–108)
Creatinine: 0.6 (ref 0.5–1.1)
Glucose: 90
Potassium: 4 mEq/L (ref 3.5–5.1)
Sodium: 140 (ref 137–147)

## 2022-05-23 LAB — HEPATIC FUNCTION PANEL
ALT: 16 U/L (ref 7–35)
AST: 27 (ref 13–35)
Alkaline Phosphatase: 37 (ref 25–125)
Bilirubin, Total: 0.5

## 2022-05-23 LAB — CBC: RBC: 3.56 — AB (ref 3.87–5.11)

## 2022-05-23 LAB — COMPREHENSIVE METABOLIC PANEL
Albumin: 3.8 (ref 3.5–5.0)
Calcium: 9.2 (ref 8.7–10.7)

## 2022-05-23 NOTE — Telephone Encounter (Signed)
Kelli Mosher,PA: I think we are ok to proceed with ANC 1.43. Thanks

## 2022-05-25 ENCOUNTER — Encounter: Payer: Self-pay | Admitting: Oncology

## 2022-06-11 ENCOUNTER — Other Ambulatory Visit: Payer: Self-pay | Admitting: Pharmacist

## 2022-06-12 ENCOUNTER — Encounter: Payer: Self-pay | Admitting: Oncology

## 2022-06-12 ENCOUNTER — Inpatient Hospital Stay: Payer: Medicare HMO | Attending: Oncology | Admitting: Oncology

## 2022-06-12 ENCOUNTER — Inpatient Hospital Stay: Payer: Medicare HMO

## 2022-06-12 VITALS — BP 141/66 | HR 77 | Temp 97.5°F | Resp 19 | Ht 62.0 in | Wt 156.3 lb

## 2022-06-12 VITALS — BP 138/62 | HR 66 | Temp 97.4°F | Resp 18 | Ht 62.0 in | Wt 156.3 lb

## 2022-06-12 DIAGNOSIS — J910 Malignant pleural effusion: Secondary | ICD-10-CM | POA: Diagnosis not present

## 2022-06-12 DIAGNOSIS — C50412 Malignant neoplasm of upper-outer quadrant of left female breast: Secondary | ICD-10-CM

## 2022-06-12 DIAGNOSIS — I890 Lymphedema, not elsewhere classified: Secondary | ICD-10-CM | POA: Insufficient documentation

## 2022-06-12 DIAGNOSIS — D649 Anemia, unspecified: Secondary | ICD-10-CM | POA: Diagnosis not present

## 2022-06-12 DIAGNOSIS — M8589 Other specified disorders of bone density and structure, multiple sites: Secondary | ICD-10-CM | POA: Insufficient documentation

## 2022-06-12 DIAGNOSIS — Z170 Estrogen receptor positive status [ER+]: Secondary | ICD-10-CM | POA: Diagnosis not present

## 2022-06-12 HISTORY — DX: Lymphedema, not elsewhere classified: I89.0

## 2022-06-12 LAB — BASIC METABOLIC PANEL
BUN: 13 (ref 4–21)
CO2: 27 — AB (ref 13–22)
Chloride: 107 (ref 99–108)
Creatinine: 0.7 (ref 0.5–1.1)
Glucose: 88
Potassium: 4.3 mEq/L (ref 3.5–5.1)
Sodium: 141 (ref 137–147)

## 2022-06-12 LAB — COMPREHENSIVE METABOLIC PANEL
Albumin: 3.9 (ref 3.5–5.0)
Calcium: 9.4 (ref 8.7–10.7)

## 2022-06-12 LAB — HEPATIC FUNCTION PANEL
ALT: 15 U/L (ref 7–35)
AST: 22 (ref 13–35)
Alkaline Phosphatase: 40 (ref 25–125)
Bilirubin, Total: 0.5

## 2022-06-12 LAB — CBC AND DIFFERENTIAL
HCT: 37 (ref 36–46)
Hemoglobin: 12.5 (ref 12.0–16.0)
Neutrophils Absolute: 1.25
Platelets: 211 10*3/uL (ref 150–400)
WBC: 2.9

## 2022-06-12 LAB — CBC: RBC: 3.55 — AB (ref 3.87–5.11)

## 2022-06-12 MED ORDER — DENOSUMAB 60 MG/ML ~~LOC~~ SOSY
60.0000 mg | PREFILLED_SYRINGE | Freq: Once | SUBCUTANEOUS | Status: AC
  Administered 2022-06-12: 60 mg via SUBCUTANEOUS
  Filled 2022-06-12: qty 1

## 2022-06-12 NOTE — Patient Instructions (Signed)
Denosumab Injection (Osteoporosis) What is this medication? DENOSUMAB (den oh SUE mab) prevents and treats osteoporosis. It works by making your bones stronger and less likely to break (fracture). It is a monoclonal antibody. This medicine may be used for other purposes; ask your health care provider or pharmacist if you have questions. COMMON BRAND NAME(S): Prolia What should I tell my care team before I take this medication? They need to know if you have any of these conditions: Dental or gum disease, or plan to have dental surgery or a tooth pulled Infection Kidney disease Low levels of calcium or vitamin D in your blood On dialysis Poor nutrition Skin conditions Thyroid disease, or have had thyroid or parathyroid surgery Trouble absorbing minerals in your stomach or intestine An unusual reaction to denosumab, other medications, foods, dyes, or preservatives Pregnant or trying to get pregnant Breast-feeding How should I use this medication? This medication is injected under the skin. It is given by your care team in a hospital or clinic setting. A special MedGuide will be given to you before each treatment. Be sure to read this information carefully each time. Talk to your care team about the use of this medication in children. Special care may be needed. Overdosage: If you think you have taken too much of this medicine contact a poison control center or emergency room at once. NOTE: This medicine is only for you. Do not share this medicine with others. What if I miss a dose? Keep appointments for follow-up doses. It is important not to miss your dose. Call your care team if you are unable to keep an appointment. What may interact with this medication? Do not take this medication with any of the following: Other medications that contain denosumab This medication may also interact with the following: Medications that lower your chance of fighting infection Steroid medications, such  as prednisone or cortisone This list may not describe all possible interactions. Give your health care provider a list of all the medicines, herbs, non-prescription drugs, or dietary supplements you use. Also tell them if you smoke, drink alcohol, or use illegal drugs. Some items may interact with your medicine. What should I watch for while using this medication? Your condition will be monitored carefully while you are receiving this medication. You may need blood work while taking this medication. This medication may increase your risk of getting an infection. Call your care team for advice if you get a fever, chills, sore throat, or other symptoms of a cold or flu. Do not treat yourself. Try to avoid being around people who are sick. Tell your dentist and dental surgeon that you are taking this medication. You should not have major dental surgery while on this medication. See your dentist to have a dental exam and fix any dental problems before starting this medication. Take good care of your teeth while on this medication. Make sure you see your dentist for regular follow-up appointments. You should make sure you get enough calcium and vitamin D while you are taking this medication. Discuss the foods you eat and the vitamins you take with your care team. Talk to your care team if you are pregnant or think you might be pregnant. This medication can cause serious birth defects if taken during pregnancy and for 5 months after the last dose. You will need a negative pregnancy test before starting this medication. Contraception is recommended while taking this medication and for 5 months after the last dose. Your care team can   help you find the option that works for you. Talk to your care team before breastfeeding. Changes to your treatment plan may be needed. What side effects may I notice from receiving this medication? Side effects that you should report to your care team as soon as possible: Allergic  reactions--skin rash, itching, hives, swelling of the face, lips, tongue, or throat Infection--fever, chills, cough, sore throat, wounds that don't heal, pain or trouble when passing urine, general feeling of discomfort or being unwell Low calcium level--muscle pain or cramps, confusion, tingling, or numbness in the hands or feet Osteonecrosis of the jaw--pain, swelling, or redness in the mouth, numbness of the jaw, poor healing after dental work, unusual discharge from the mouth, visible bones in the mouth Severe bone, joint, or muscle pain Skin infection--skin redness, swelling, warmth, or pain Side effects that usually do not require medical attention (report these to your care team if they continue or are bothersome): Back pain Headache Joint pain Muscle pain Pain in the hands, arms, legs, or feet Runny or stuffy nose Sore throat This list may not describe all possible side effects. Call your doctor for medical advice about side effects. You may report side effects to FDA at 1-800-FDA-1088. Where should I keep my medication? This medication is given in a hospital or clinic. It will not be stored at home. NOTE: This sheet is a summary. It may not cover all possible information. If you have questions about this medicine, talk to your doctor, pharmacist, or health care provider.  2023 Elsevier/Gold Standard (2022-02-04 00:00:00)  

## 2022-06-12 NOTE — Progress Notes (Signed)
[facility]  [address] 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 CT chest in December revealed right middle and lower lobe collapse with large right pleural effusion. No central obstructing mass lesion evident. No obvious right-sided pleural disease although there is a irregular focus of soft tissue attenuation along the medial right upper lobe pleura/right anterior mediastinum. Scattered tiny bilateral pulmonary nodules measuring up to 4 mm.  PET was negative.  She required thoracentesis on 2 occasions, the last in January.  Cytology revealed malignant cells consistent with breast primary, ER/PR positive.  She was started on anastrozole 1 mg daily on January 5.  Oral chemotherapy with palbociclib 125 mg daily, for 3 weeks on and 1 week off, was added in February.  After 1 cycle, she developed severe neutropenia. Her 2nd cycle was postponed 1 week.  Prior to a 3rd cycle, she had recurrent neutropenia, so her chemotherapy was held for 2 weeks and the palbociclib dose reduced to 100 mg daily.  She had persistent neutropenia causing delay in her 5th cycle.  CT chest, abdomen and pelvis on July 24 revealed a decrease in the large right pleural effusion and compressive right lung atelectasis.  No new or progressive disease was seen within the chest, abdomen or pelvis.  Neutropenia She has had persistent and recurrent neutropenia despite dose reduction and she is now down to 75 mg daily for 3 weeks on and 1 week off.  She is just finishing up this cycle with 1 more pill to take.  She knows to continue anastrozole daily.      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  She now has recurrent disease.   Other specified disorders of bone density and structure, multiple sites Osteopenia, for which she has been receiving denosumab every 6 months, next due in September.  She continues calcium and vitamin-D as recommended.  She will be due for repeat bone density in September, which I will schedule.  We will hold her next dose of denosumab until after her bone density scan.  Lymphedema This is a new finding of her left upper extremity and 1+ at this time.  I gave her some literature about the diagnosis of lymphedema.  I find no adenopathy or tumor of the axilla.  I will refer her to our lymphedema specialist.    We will plan to see her back in 4 weeks with a CBC and comprehensive panel prior to her 8th cycle of palbociclib.  I will refer her to our lymphedema specialist. The patient understands the plans discussed today and is in agreement with them.  She knows to contact our office if she develops concerns prior to her next appointment.     I provided 15 minutes of face-to-face time during this encounter and > 50% was spent counseling as documented under my assessment and plan.      Derwood Kaplan, MD  [facility] AT [facility] [address] [place] [address] Dept: [phone] Dept Fax: [phone]   Orders Placed This Encounter  Procedures   CBC and differential    This external order was created through the Results Console.   CBC    This external order was created through the Results Console.   Basic metabolic panel    This external order was created through the Results Console.   Comprehensive metabolic panel    This external order was created through the Results Console.   Hepatic function panel     This external order was created through the Results Console.      CHIEF COMPLAINT:  CC: Recurrent hormone receptor positive breast cancer, as well as osteoporosis  Current Treatment: Palbociclib/anastrozole with denosumab every 6 months  HISTORY OF PRESENT ILLNESS:   Oncology History  Malignant neoplasm of upper-outer quadrant of left female breast ([place])  03/07/2006 Initial Diagnosis   Breast cancer, left breast ([place])   03/17/2006 Cancer Staging   Staging form: Breast, AJCC 6th Edition - Clinical stage from 03/17/2006: Stage IIIA (T1c, N2a, M0) - Signed by Derwood Kaplan, MD on 12/15/2020 Staged by: Managing physician Diagnostic confirmation: Positive histology Specimen type: Excision Histopathologic type: Infiltrating duct carcinoma, NOS Tumor size (mm): 15 Laterality: Left Total positive nodes: 5 Histologic grade (G): G1 Residual tumor (R): R0 - None Stage prefix: Initial diagnosis Lymphatic vessel invasion (L): L0 - No lymphatic vessel invasion Venous invasion (V): V0 - No venous invasion Prognostic indicators: ER/PR pos, HER 2 neg., Rx with AC x 3 mo, then Taxol x 3 mo.,AI x 10 yrs       INTERVAL HISTORY:  Carrie Wilkerson is here today for repeat clinical assessment as she finishes her seventh cycle of palbociclib, which is now given at a reduced dose of 75 mg daily due to persistent delays in therapy due to neutropenia.  She states she continues anastrozole daily without difficulty.  She denies side effects associated with palbociclib.  Her CBC reveals a white count of 2.9 with an [place] of 1250.  Hemoglobin and platelet count are normal.  Comprehensive metabolic profile is normal.  Her only complaint is swelling of the left arm which is a new finding.  She denies fevers or chills. She denies pain. Her appetite is good.  Her weight has increased 4 pounds over the last month. REVIEW OF SYSTEMS:  Review of Systems  Constitutional:  Negative for appetite change, chills, fatigue, fever  and unexpected weight change.  HENT:   Negative for lump/mass, mouth sores and sore throat.   Respiratory:  Negative for cough and shortness of breath.   Cardiovascular:  Negative for chest pain and leg swelling.  Gastrointestinal:  Negative for abdominal pain, constipation, diarrhea, nausea and vomiting.  Endocrine: Negative for hot flashes.  Genitourinary:  Negative for difficulty urinating, dysuria, frequency and hematuria.   Musculoskeletal:  Negative for arthralgias, back pain and myalgias.  Skin:  Negative for rash.  Neurological:  Negative for dizziness and headaches.  Hematological:  Negative for adenopathy. Does not bruise/bleed easily.  Psychiatric/Behavioral:  Negative for depression and sleep disturbance. The patient is not nervous/anxious.      VITALS:  Blood pressure (!) 141/66, pulse 77, temperature (!) 97.5 F (36.4 C), temperature source Oral, resp. rate 19, height 5' 2"  (1.575 m), weight 156 lb 4.8 oz (70.9 kg), SpO2 93 %.  Wt Readings from Last 3 Encounters:  06/12/22 156 lb 4.8 oz (70.9 kg)  06/12/22 156 lb 4.8 oz (70.9 kg)  05/16/22 152 lb (68.9 kg)    Body mass index is 28.59 kg/m.  Performance status (  ECOG): 0 - Asymptomatic  PHYSICAL EXAM:  Physical Exam Vitals and nursing note reviewed.  Constitutional:      General: She is not in acute distress.    Appearance: Normal appearance.  HENT:     Head: Normocephalic and atraumatic.     Mouth/Throat:     Mouth: Mucous membranes are moist.     Pharynx: Oropharynx is clear. No oropharyngeal exudate or posterior oropharyngeal erythema.  Eyes:     General: No scleral icterus.    Extraocular Movements: Extraocular movements intact.     Conjunctiva/sclera: Conjunctivae normal.     Pupils: Pupils are equal, round, and reactive to light.  Cardiovascular:     Rate and Rhythm: Normal rate and regular rhythm.     Heart sounds: Normal heart sounds. No murmur heard.    No friction rub. No gallop.  Pulmonary:      Effort: Pulmonary effort is normal.     Breath sounds: Normal breath sounds. No wheezing, rhonchi or rales.  Chest:     Chest wall: No mass.  Breasts:    Right: Absent.     Left: Absent.     Comments: Bilateral mastectomy sites are negative Abdominal:     General: There is no distension.     Palpations: Abdomen is soft. There is no hepatomegaly, splenomegaly or mass.     Tenderness: There is no abdominal tenderness.  Musculoskeletal:        General: Swelling present. Normal range of motion.     Cervical back: Normal range of motion and neck supple. No tenderness.     Right lower leg: No edema.     Left lower leg: No edema.     Comments: She has 1+ lymphedema of the left upper extremity  Lymphadenopathy:     Cervical: No cervical adenopathy.     Upper Body:     Right upper body: No supraclavicular or axillary adenopathy.     Left upper body: No supraclavicular or axillary adenopathy.     Lower Body: No right inguinal adenopathy. No left inguinal adenopathy.  Skin:    General: Skin is warm and dry.     Coloration: Skin is not jaundiced.     Findings: No rash.  Neurological:     Mental Status: She is alert and oriented to person, place, and time.     Cranial Nerves: No cranial nerve deficit.  Psychiatric:        Mood and Affect: Mood normal.        Behavior: Behavior normal.        Thought Content: Thought content normal.   LABS:      Latest Ref Rng & Units 06/12/2022   12:00 AM 05/23/2022   12:00 AM 05/16/2022   12:00 AM  CBC  WBC  2.9     3.4     2.7      Hemoglobin 12.0 - 16.0 12.5     12.5     12.5      Hematocrit 36 - 46 37     37     38      Platelets 150 - 400 K/uL 211     280     177         This result is from an external source.      Latest Ref Rng & Units 06/12/2022   12:00 AM 05/23/2022   12:00 AM 05/16/2022   12:00 AM  CMP  BUN 4 - 21  13     13     21       Creatinine 0.5 - 1.1 0.7     0.6     0.8      Sodium 137 - 147 141     140     141      Potassium  3.5 - 5.1 mEq/L 4.3     4.0     3.9      Chloride 99 - 108 107     108     106      CO2 13 - 22 27     26     29       Calcium 8.7 - 10.7 9.4     9.2     9.1      Alkaline Phos 25 - 125 40     37     37      AST 13 - 35 22     27     23       ALT 7 - 35 U/L 15     16     14          This result is from an external source.     Lab Results  Component Value Date   CEA1 1.6 10/11/2021   /  CEA  Date Value Ref Range Status  10/11/2021 1.6 0.0 - 4.7 ng/mL Final    Comment:    (NOTE)                             Nonsmokers          <3.9                             Smokers             <5.6 Roche [employer] Electrochemiluminescence Immunoassay (ECLIA) Values obtained with different assay methods or kits cannot be used interchangeably.  Results cannot be interpreted as absolute evidence of the presence or absence of malignant disease. Performed At: [facility] [place], [place] [id number] Rush Farmer MD [id number]    No results found for: "PSA1" No results found for: "[id number]" No results found for: "CAN125"  No results found for: "TOTALPROTELP", "ALBUMINELP", "A1GS", "A2GS", "BETS", "BETA2SER", "GAMS", "MSPIKE", "SPEI" No results found for: "TIBC", "FERRITIN", "IRONPCTSAT" No results found for: "LDH"  STUDIES:  No results found.    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  Report  date is 11/29/21.   The [facility]+RNAinsight panel offered by [facility] includes sequencing and rearrangement analysis for the following 47 genes:  APC, ATM, AXIN2, BARD1, BMPR1A, BRCA1, BRCA2, BRIP1, CDH1, CDK4, CDKN2A, CHEK2, DICER1, EPCAM, GREM1, HOXB13,    History of left breast cancer 06/13/2021   Hypotension 06/13/2021   Incisional hernia, without  obstruction or gangrene 10/22/2016   Malignant neoplasm of upper-outer quadrant of left female breast ([place]) 03/07/2006   Mixed dyslipidemia 11/03/2018   Numbness in right leg    Hx: of   Obesity (BMI 30.0-34.9) 09/21/2020   Other specified disorders of bone density and structure, multiple sites 09/20/2020   Paroxysmal atrial fibrillation ([place]) 02/13/2016   Personal history of COVID-19 09/2019   Pleural effusion, 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   Pneumonia    Hx: of 'a long time ago"   Postoperative examination 02/05/2016    Past Surgical History:  Procedure Laterality Date   APPENDECTOMY     BACK SURGERY     BREAST SURGERY     Hx: of lumpectomy left breast 2007   COLONOSCOPY     Hx: of   DILATION AND CURETTAGE OF UTERUS     TONSILLECTOMY     TOTAL HIP ARTHROPLASTY Right 04/13/2013   Dr Ninfa Linden   TOTAL HIP ARTHROPLASTY Right 04/13/2013   Procedure: RIGHT TOTAL HIP ARTHROPLASTY ANTERIOR APPROACH;  Surgeon: Mcarthur Rossetti, MD;  Location: [facility];  Service: Orthopedics;  Laterality: Right;   TUBAL LIGATION      Family History  Problem Relation Age of Onset   Hypertension Sister    Cancer Sister 76       uterine or ovarian   Bone cancer Sister 63   Breast cancer Sister        dx 26 and 44   Uterine cancer Sister 57   Colon cancer Sister 19   Breast cancer Sister 74   Hypertension Brother    Kidney cancer Brother 49   Cancer Brother 68       unknown type; mets   Liver cancer Brother    Liver cancer Brother 88   Pancreatic cancer Niece 68   Breast cancer Niece 23   Colon cancer Nephew 65       mets    Social History:   reports that she has never smoked. She has never used smokeless tobacco. She reports that she does not drink alcohol and does not use drugs.The patient is alone today.  Allergies:  Allergies  Allergen Reactions   Codeine     unknown    Sulfa Antibiotics     unknown    Chlorhexidine Rash    Current Medications: Current Outpatient Medications  Medication Sig Dispense Refill   anastrozole (ARIMIDEX) 1 MG tablet Take 1 tablet (1 mg total) by mouth daily. 90 tablet 3   apixaban (ELIQUIS) 5 MG TABS tablet Take 1 tablet (5 mg total) by mouth 2 (two) times daily. 180 tablet 3   atorvastatin (LIPITOR) 10 MG tablet Take 1 tablet by mouth every other day.  4   CALCIUM PO Take 1 tablet by mouth daily.     cholecalciferol (VITAMIN D) 1000 UNITS tablet Take 1,000 Units by mouth daily.     denosumab (PROLIA) 60 MG/ML SOLN injection Inject 60 mg into the skin every 6 (six) months. Administer in upper arm, thigh, or abdomen     Flaxseed, Linseed, OIL Take 1 capsule by mouth daily.     glucosamine-chondroitin 500-400 MG tablet Take 1 tablet by mouth 2 (two) times daily.      levothyroxine (SYNTHROID, LEVOTHROID) 75 MCG tablet Take 75 mcg by mouth daily.   7   metoprolol tartrate (LOPRESSOR) 25 MG tablet Take 1 tablet (25 mg total) by mouth 2 (two) times daily. 180 tablet 3   Multiple Vitamin (MULTIVITAMIN WITH MINERALS) TABS  Take 1 tablet by mouth daily.     ondansetron (ZOFRAN-ODT) 4 MG disintegrating tablet Take 4 mg by mouth 2 (two) times daily as needed for nausea/vomiting.     palbociclib (IBRANCE) 75 MG capsule Take 75 mg by mouth daily with breakfast. Take whole with food. Take for 21 days on, 7 days off, repeat every 28 days.     No current facility-administered medications for this visit.

## 2022-06-18 ENCOUNTER — Telehealth: Payer: Self-pay

## 2022-06-18 NOTE — Telephone Encounter (Signed)
Referral already sent

## 2022-06-18 NOTE — Telephone Encounter (Signed)
-----   Message from Derwood Kaplan, MD sent at 06/12/2022  6:22 PM EDT ----- Regarding: refer Refer Amy in OT regarding lymphedema, new onset, as we discussed

## 2022-07-01 ENCOUNTER — Encounter: Payer: Self-pay | Admitting: Oncology

## 2022-07-05 ENCOUNTER — Telehealth: Payer: Self-pay

## 2022-07-05 NOTE — Telephone Encounter (Signed)
-----   Message from Derwood Kaplan, MD sent at 07/01/2022  7:30 PM EDT ----- Regarding: Amy in OT Did we ever refer her to the lymphedema specialist?

## 2022-07-05 NOTE — Telephone Encounter (Signed)
Yes referral was sent to Amy at [facility] outpatient therapy department

## 2022-07-10 ENCOUNTER — Telehealth: Payer: Self-pay | Admitting: Hematology and Oncology

## 2022-07-10 ENCOUNTER — Encounter: Payer: Self-pay | Admitting: Hematology and Oncology

## 2022-07-10 ENCOUNTER — Inpatient Hospital Stay: Payer: Medicare HMO

## 2022-07-10 ENCOUNTER — Inpatient Hospital Stay: Payer: Medicare HMO | Attending: Oncology | Admitting: Hematology and Oncology

## 2022-07-10 DIAGNOSIS — I890 Lymphedema, not elsewhere classified: Secondary | ICD-10-CM

## 2022-07-10 DIAGNOSIS — C50412 Malignant neoplasm of upper-outer quadrant of left female breast: Secondary | ICD-10-CM | POA: Diagnosis not present

## 2022-07-10 DIAGNOSIS — J910 Malignant pleural effusion: Secondary | ICD-10-CM

## 2022-07-10 DIAGNOSIS — M8589 Other specified disorders of bone density and structure, multiple sites: Secondary | ICD-10-CM

## 2022-07-10 DIAGNOSIS — Z170 Estrogen receptor positive status [ER+]: Secondary | ICD-10-CM

## 2022-07-10 DIAGNOSIS — D649 Anemia, unspecified: Secondary | ICD-10-CM | POA: Diagnosis not present

## 2022-07-10 LAB — CBC AND DIFFERENTIAL
HCT: 36 (ref 36–46)
Hemoglobin: 12.3 (ref 12.0–16.0)
MCV: 103 — AB (ref 81–99)
Neutrophils Absolute: 1.13
Platelets: 219 10*3/uL (ref 150–400)
WBC: 2.5

## 2022-07-10 LAB — HEPATIC FUNCTION PANEL
ALT: 14 U/L (ref 7–35)
AST: 25 (ref 13–35)
Alkaline Phosphatase: 38 (ref 25–125)
Bilirubin, Total: 0.5

## 2022-07-10 LAB — COMPREHENSIVE METABOLIC PANEL
Albumin: 3.8 (ref 3.5–5.0)
Calcium: 9.4 (ref 8.7–10.7)

## 2022-07-10 LAB — BASIC METABOLIC PANEL
BUN: 15 (ref 4–21)
CO2: 30 — AB (ref 13–22)
Chloride: 108 (ref 99–108)
Creatinine: 0.8 (ref 0.5–1.1)
Glucose: 87
Potassium: 4 mEq/L (ref 3.5–5.1)
Sodium: 142 (ref 137–147)

## 2022-07-10 LAB — CBC: RBC: 3.51 — AB (ref 3.87–5.11)

## 2022-07-10 NOTE — Telephone Encounter (Signed)
07/10/22 NEXT APPT SCHEDULED AND CONFIRMED WITH PATIENT

## 2022-07-10 NOTE — Assessment & Plan Note (Signed)
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  She now has recurrent disease. 

## 2022-07-10 NOTE — Assessment & Plan Note (Signed)
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 CT chest in December revealed right middle and lower lobe collapse with large right pleural effusion. No central obstructing mass lesion evident. No obvious right-sided pleural disease although there is a irregular focus of soft tissue attenuation along the medial right upper lobe pleura/right anterior mediastinum. Scattered tiny bilateral pulmonary nodules measuring up to 4 mm.  PET was negative.  She required thoracentesis on 2 occasions, the last in January.  Cytology revealed malignant cells consistent with breast primary, ER/PR positive.  She was started on anastrozole 1 mg daily on January 5.  Oral chemotherapy with palbociclib 125 mg daily, for 3 weeks on and 1 week off, was added in February.  After 1 cycle, she developed severe neutropenia. Her 2nd cycle was postponed 1 week.  Prior to a 3rd cycle, she had recurrent neutropenia, so her chemotherapy was held for 2 weeks and the palbociclib dose reduced to 100 mg daily.  She had persistent neutropenia causing delay in her 5th cycle.  CT chest, abdomen and pelvis on July 24 revealed a decrease in the large right pleural effusion and compressive right lung atelectasis.  No new or progressive disease was seen within the chest, abdomen or pelvis.  Due to neutropenia, palbociclib was reduced to 75 mg daily.  She has mild neutropenia, but is just finishing a 7th cycle of palbociclib.  She knows to continue anastrozole daily.  I will plan to repeat a CBC in 1 week prior to resuming palbociclib 75 mg daily.  We will plan to see her back in 5 weeks with a CBC and comprehensive panel prior to an 8th cycle of palbociclib.

## 2022-07-10 NOTE — Assessment & Plan Note (Addendum)
Osteopenia, for which she has been receiving denosumab every 6 months.  She did receive a dose in September. She continues calcium and vitamin-D as recommended. She was due for repeat bone density in September, which I ordered, but did not get scheduled.  I will plan to do that at this time.

## 2022-07-10 NOTE — Assessment & Plan Note (Signed)
Left upper extremity edema.  We had referred her to the lymphedema specialist, but she has not heard anything.  We will check on that referral.

## 2022-07-10 NOTE — Progress Notes (Signed)
[facility] [place]  [address] 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 CT chest in December revealed right middle and lower lobe collapse with large right pleural effusion. No central obstructing mass lesion evident. No obvious right-sided pleural disease although there is a irregular focus of soft tissue attenuation along the medial right upper lobe pleura/right anterior mediastinum. Scattered tiny bilateral pulmonary nodules measuring up to 4 mm.  PET was negative.  She required thoracentesis on 2 occasions, the last in January.  Cytology revealed malignant cells consistent with breast primary, ER/PR positive.  She was started on anastrozole 1 mg daily on January 5.  Oral chemotherapy with palbociclib 125 mg daily, for 3 weeks on and 1 week off, was added in February.  After 1 cycle, she developed severe neutropenia. Her 2nd cycle was postponed 1 week.  Prior to a 3rd cycle, she had recurrent neutropenia, so her chemotherapy was held for 2 weeks and the palbociclib dose reduced to 100 mg daily.  She had persistent neutropenia causing delay in her 5th cycle.  CT chest, abdomen and pelvis on July 24 revealed a decrease in the large right pleural effusion and compressive right lung atelectasis.  No new or progressive disease was seen within the chest, abdomen or pelvis.  Due to neutropenia, palbociclib was reduced to 75 mg daily.  She has mild neutropenia, but is just finishing a 7th cycle of palbociclib.  She knows to continue anastrozole daily.  I will plan to repeat a CBC in 1 week prior to resuming palbociclib 75 mg daily.  We will plan to see her back in 5 weeks with a CBC and comprehensive  panel prior to an 8th cycle of palbociclib.    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  She now has recurrent disease.  Other specified disorders of bone density and structure, multiple sites Osteopenia, for which she has been receiving denosumab every 6 months.  She did receive a dose in September.  She continues calcium and vitamin-D as recommended.  She was due for repeat bone density in September, which I ordered, but did not get scheduled.  I will plan to do that at this time.  Lymphedema of left upper extremity Left upper extremity edema.  We had referred her to the lymphedema specialist, but she has not heard anything.  We will check on that referral.   The patient understands the plans discussed today and is in agreement with them.  She knows to contact our office if she develops concerns prior to her next appointment.   I provided 20 minutes of face-to-face time during this encounter and > 50% was spent counseling as documented under my assessment and plan.    [name], PA-C  [facility] AT [facility] [address] [place] [address] Dept: [phone] Dept Fax: [phone]   Orders Placed This Encounter  Procedures   CBC and differential    This external order was created through the Results Console.   CBC    This external order  was created through the Results Console.   Basic metabolic panel    This external order was created through the Results Console.   Comprehensive metabolic panel    This external order was created through the Results Console.   Hepatic function panel    This external order was created through the Results Console.      CHIEF COMPLAINT:  CC: Recurrent hormone receptor  positive breast cancer  Current Treatment: Anastrozole/palbociclib  HISTORY OF PRESENT ILLNESS:   Oncology History  Malignant neoplasm of upper-outer quadrant of left female breast ([place])  03/07/2006 Initial Diagnosis   Breast cancer, left breast ([place])   03/17/2006 Cancer Staging   Staging form: Breast, AJCC 6th Edition - Clinical stage from 03/17/2006: Stage IIIA (T1c, N2a, M0) - Signed by Derwood Kaplan, MD on 12/15/2020 Staged by: Managing physician Diagnostic confirmation: Positive histology Specimen type: Excision Histopathologic type: Infiltrating duct carcinoma, NOS Tumor size (mm): 15 Laterality: Left Total positive nodes: 5 Histologic grade (G): G1 Residual tumor (R): R0 - None Stage prefix: Initial diagnosis Lymphatic vessel invasion (L): L0 - No lymphatic vessel invasion Venous invasion (V): V0 - No venous invasion Prognostic indicators: ER/PR pos, HER 2 neg., Rx with AC x 3 mo, then Taxol x 3 mo.,AI x 10 yrs       INTERVAL HISTORY:  Akire is here today for repeat clinical assessment prior to 8th cycle of palbociclib 75 mg daily.  She continues anastrozole 1 mg daily without difficulty.  She is just finishing her 7th cycle tomorrow due to previous delays.  She reports persistent left upper extremity lymphedema. She denies fevers or chills. She denies pain. Her appetite is good. Her weight has increased 1 pounds over last 4 weeks .  REVIEW OF SYSTEMS:  Review of Systems  Constitutional:  Negative for appetite change, chills, fatigue, fever and unexpected weight change.  HENT:   Negative for lump/mass, mouth sores and sore throat.   Respiratory:  Negative for cough and shortness of breath.   Cardiovascular:  Negative for chest pain and leg swelling.  Gastrointestinal:  Negative for abdominal pain, constipation, diarrhea, nausea and vomiting.  Endocrine: Negative for hot flashes.  Genitourinary:  Negative for difficulty urinating, dysuria, frequency and hematuria.    Musculoskeletal:  Negative for arthralgias, back pain and myalgias.  Skin:  Negative for rash.  Neurological:  Negative for dizziness and headaches.  Hematological:  Negative for adenopathy. Does not bruise/bleed easily.  Psychiatric/Behavioral:  Negative for depression and sleep disturbance. The patient is not nervous/anxious.      VITALS:  Blood pressure 139/63, pulse (!) 59, temperature (!) 97.5 F (36.4 C), temperature source Oral, resp. rate 20, height 5' 2"  (1.575 m), weight 157 lb 1.6 oz (71.3 kg), SpO2 97 %.  Wt Readings from Last 3 Encounters:  07/10/22 157 lb 1.6 oz (71.3 kg)  06/12/22 156 lb 4.8 oz (70.9 kg)  06/12/22 156 lb 4.8 oz (70.9 kg)    Body mass index is 28.73 kg/m.  Performance status (ECOG): 1 - Symptomatic but completely ambulatory  PHYSICAL EXAM:  Physical Exam Vitals and nursing note reviewed.  Constitutional:      General: She is not in acute distress.    Appearance: Normal appearance.  HENT:     Head: Normocephalic and atraumatic.     Mouth/Throat:     Mouth: Mucous membranes are moist.     Pharynx: Oropharynx is clear. No oropharyngeal exudate or posterior oropharyngeal erythema.  Eyes:  General: No scleral icterus.    Extraocular Movements: Extraocular movements intact.     Conjunctiva/sclera: Conjunctivae normal.     Pupils: Pupils are equal, round, and reactive to light.  Cardiovascular:     Rate and Rhythm: Normal rate and regular rhythm.     Heart sounds: Normal heart sounds. No murmur heard.    No friction rub. No gallop.  Pulmonary:     Effort: Pulmonary effort is normal.     Breath sounds: Normal breath sounds. No wheezing, rhonchi or rales.  Chest:     Comments: Breast exam is deferred Abdominal:     General: There is no distension.     Palpations: Abdomen is soft. There is no hepatomegaly, splenomegaly or mass.     Tenderness: There is no abdominal tenderness.  Musculoskeletal:        General: Normal range of motion.      Left upper arm: Edema present.     Cervical back: Normal range of motion and neck supple. No tenderness.     Right lower leg: No edema.     Left lower leg: No edema.  Lymphadenopathy:     Cervical: No cervical adenopathy.     Upper Body:     Right upper body: No supraclavicular or axillary adenopathy.     Left upper body: No supraclavicular or axillary adenopathy.     Lower Body: No right inguinal adenopathy. No left inguinal adenopathy.  Skin:    General: Skin is warm and dry.     Coloration: Skin is not jaundiced.     Findings: No rash.  Neurological:     Mental Status: She is alert and oriented to person, place, and time.     Cranial Nerves: No cranial nerve deficit.  Psychiatric:        Mood and Affect: Mood normal.        Behavior: Behavior normal.        Thought Content: Thought content normal.     LABS:      Latest Ref Rng & Units 07/10/2022   12:00 AM 06/12/2022   12:00 AM 05/23/2022   12:00 AM  CBC  WBC  2.5     2.9     3.4      Hemoglobin 12.0 - 16.0 12.3     12.5     12.5      Hematocrit 36 - 46 36     37     37      Platelets 150 - 400 K/uL 219     211     280         This result is from an external source.      Latest Ref Rng & Units 07/10/2022   12:00 AM 06/12/2022   12:00 AM 05/23/2022   12:00 AM  CMP  BUN 4 - 21 15     13     13       Creatinine 0.5 - 1.1 0.8     0.7     0.6      Sodium 137 - 147 142     141     140      Potassium 3.5 - 5.1 mEq/L 4.0     4.3     4.0      Chloride 99 - 108 108     107     108      CO2 13 - 22 30     27  26      Calcium 8.7 - 10.7 9.4     9.4     9.2      Alkaline Phos 25 - 125 38     40     37      AST 13 - 35 25     22     27       ALT 7 - 35 U/L 14     15     16          This result is from an external source.     Lab Results  Component Value Date   CEA1 1.6 10/11/2021   /  CEA  Date Value Ref Range Status  10/11/2021 1.6 0.0 - 4.7 ng/mL Final    Comment:    (NOTE)                             Nonsmokers           <3.9                             Smokers             <5.6 Roche [employer] Electrochemiluminescence Immunoassay (ECLIA) Values obtained with different assay methods or kits cannot be used interchangeably.  Results cannot be interpreted as absolute evidence of the presence or absence of malignant disease. Performed At: [facility] [place], [place] [id number] Rush Farmer MD MB:[id number]    No results found for: "PSA1" No results found for: "208 400 8517" No results found for: "CAN125"  No results found for: "TOTALPROTELP", "ALBUMINELP", "A1GS", "A2GS", "BETS", "BETA2SER", "GAMS", "MSPIKE", "SPEI" No results found for: "TIBC", "FERRITIN", "IRONPCTSAT" No results found for: "LDH"  STUDIES:  No results found.    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  Report date is 11/29/21.   The [facility]+RNAinsight panel offered by [facility] includes sequencing and rearrangement analysis for the following 47 genes:  APC, ATM, AXIN2, BARD1, BMPR1A, BRCA1, BRCA2, BRIP1, CDH1, CDK4, CDKN2A, CHEK2, DICER1, EPCAM, GREM1, HOXB13,    History of left breast cancer 06/13/2021   Hypotension 06/13/2021   Incisional hernia, without obstruction or gangrene 10/22/2016   Malignant neoplasm of upper-outer quadrant of left female breast ([place]) 03/07/2006   Mixed dyslipidemia 11/03/2018   Numbness in right leg     Hx: of   Obesity (BMI 30.0-34.9) 09/21/2020   Other specified disorders of bone density and structure, multiple sites 09/20/2020   Paroxysmal atrial fibrillation ([place]) 02/13/2016   Personal history of COVID-19 09/2019   Pleural effusion, 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   Pneumonia    Hx: of 'a long time ago"   Postoperative examination 02/05/2016    Past Surgical History:  Procedure Laterality Date   APPENDECTOMY     BACK SURGERY  BREAST SURGERY     Hx: of lumpectomy left breast 2007   COLONOSCOPY     Hx: of   DILATION AND CURETTAGE OF UTERUS     TONSILLECTOMY     TOTAL HIP ARTHROPLASTY Right 04/13/2013   Dr Ninfa Linden   TOTAL HIP ARTHROPLASTY Right 04/13/2013   Procedure: RIGHT TOTAL HIP ARTHROPLASTY ANTERIOR APPROACH;  Surgeon: Mcarthur Rossetti, MD;  Location: [facility] [place];  Service: Orthopedics;  Laterality: Right;   TUBAL LIGATION      Family History  Problem Relation Age of Onset   Hypertension Sister    Cancer Sister 53       uterine or ovarian   Bone cancer Sister 84   Breast cancer Sister        dx 17 and 56   Uterine cancer Sister 17   Colon cancer Sister 21   Breast cancer Sister 62   Hypertension Brother    Kidney cancer Brother 82   Cancer Brother 41       unknown type; mets   Liver cancer Brother    Liver cancer Brother 37   Pancreatic cancer Niece 10   Breast cancer Niece 69   Colon cancer Nephew 25       mets    Social History:  reports that she has never smoked. She has never used smokeless tobacco. She reports that she does not drink alcohol and does not use drugs.The patient is alone today.  Allergies:  Allergies  Allergen Reactions   Codeine     unknown    Sulfa Antibiotics     unknown    Chlorhexidine Rash    Current Medications: Current Outpatient Medications  Medication Sig Dispense Refill   anastrozole (ARIMIDEX) 1 MG tablet Take 1 tablet (1 mg total) by mouth daily. 90 tablet 3   apixaban (ELIQUIS) 5 MG TABS tablet Take 1 tablet (5 mg total)  by mouth 2 (two) times daily. 180 tablet 3   atorvastatin (LIPITOR) 10 MG tablet Take 1 tablet by mouth every other day.  4   CALCIUM PO Take 1 tablet by mouth daily.     cholecalciferol (VITAMIN D) 1000 UNITS tablet Take 1,000 Units by mouth daily.     denosumab (PROLIA) 60 MG/ML SOLN injection Inject 60 mg into the skin every 6 (six) months. Administer in upper arm, thigh, or abdomen     Flaxseed, Linseed, OIL Take 1 capsule by mouth daily.     glucosamine-chondroitin 500-400 MG tablet Take 1 tablet by mouth 2 (two) times daily.      levothyroxine (SYNTHROID, LEVOTHROID) 75 MCG tablet Take 75 mcg by mouth daily.   7   metoprolol tartrate (LOPRESSOR) 25 MG tablet Take 1 tablet (25 mg total) by mouth 2 (two) times daily. 180 tablet 3   Multiple Vitamin (MULTIVITAMIN WITH MINERALS) TABS Take 1 tablet by mouth daily.     ondansetron (ZOFRAN-ODT) 4 MG disintegrating tablet Take 4 mg by mouth 2 (two) times daily as needed for nausea/vomiting.     palbociclib (IBRANCE) 75 MG capsule Take 75 mg by mouth daily with breakfast. Take whole with food. Take for 21 days on, 7 days off, repeat every 28 days.     No current facility-administered medications for this visit.

## 2022-07-17 ENCOUNTER — Other Ambulatory Visit: Payer: Self-pay | Admitting: Hematology and Oncology

## 2022-07-17 ENCOUNTER — Inpatient Hospital Stay: Payer: Medicare HMO

## 2022-07-17 ENCOUNTER — Telehealth: Payer: Self-pay | Admitting: Hematology and Oncology

## 2022-07-17 DIAGNOSIS — I890 Lymphedema, not elsewhere classified: Secondary | ICD-10-CM | POA: Diagnosis not present

## 2022-07-17 LAB — BASIC METABOLIC PANEL
BUN: 16 (ref 4–21)
CO2: 28 — AB (ref 13–22)
Chloride: 109 — AB (ref 99–108)
Creatinine: 0.7 (ref 0.5–1.1)
Glucose: 83
Potassium: 3.9 mEq/L (ref 3.5–5.1)
Sodium: 142 (ref 137–147)

## 2022-07-17 LAB — CBC AND DIFFERENTIAL
HCT: 37 (ref 36–46)
Hemoglobin: 12.7 (ref 12.0–16.0)
Neutrophils Absolute: 1.09
Platelets: 187 10*3/uL (ref 150–400)
WBC: 2.6

## 2022-07-17 LAB — COMPREHENSIVE METABOLIC PANEL
Albumin: 3.8 (ref 3.5–5.0)
Calcium: 9.2 (ref 8.7–10.7)

## 2022-07-17 LAB — HEPATIC FUNCTION PANEL
ALT: 15 U/L (ref 7–35)
AST: 36 — AB (ref 13–35)
Alkaline Phosphatase: 37 (ref 25–125)
Bilirubin, Total: 0.5

## 2022-07-17 LAB — CBC: RBC: 3.6 — AB (ref 3.87–5.11)

## 2022-07-17 NOTE — Telephone Encounter (Signed)
Patient here for CBC prior to resuming palbociclib. Her ANC is lower than previous, so we will continue to hold palbociclib. Scheduled her for lab again next week.

## 2022-07-24 ENCOUNTER — Inpatient Hospital Stay: Payer: Medicare HMO

## 2022-07-24 DIAGNOSIS — D649 Anemia, unspecified: Secondary | ICD-10-CM | POA: Diagnosis not present

## 2022-07-24 DIAGNOSIS — C50412 Malignant neoplasm of upper-outer quadrant of left female breast: Secondary | ICD-10-CM

## 2022-07-24 DIAGNOSIS — M8589 Other specified disorders of bone density and structure, multiple sites: Secondary | ICD-10-CM | POA: Diagnosis not present

## 2022-07-24 LAB — CBC AND DIFFERENTIAL
HCT: 36 (ref 36–46)
Hemoglobin: 12.5 (ref 12.0–16.0)
Neutrophils Absolute: 1.6
Platelets: 232 10*3/uL (ref 150–400)
WBC: 3.4

## 2022-07-24 LAB — CBC: RBC: 3.58 — AB (ref 3.87–5.11)

## 2022-07-29 DIAGNOSIS — I890 Lymphedema, not elsewhere classified: Secondary | ICD-10-CM | POA: Diagnosis not present

## 2022-07-31 DIAGNOSIS — I890 Lymphedema, not elsewhere classified: Secondary | ICD-10-CM | POA: Diagnosis not present

## 2022-08-02 DIAGNOSIS — I890 Lymphedema, not elsewhere classified: Secondary | ICD-10-CM | POA: Diagnosis not present

## 2022-08-05 DIAGNOSIS — I890 Lymphedema, not elsewhere classified: Secondary | ICD-10-CM | POA: Diagnosis not present

## 2022-08-07 DIAGNOSIS — I890 Lymphedema, not elsewhere classified: Secondary | ICD-10-CM | POA: Diagnosis not present

## 2022-08-14 ENCOUNTER — Inpatient Hospital Stay (INDEPENDENT_AMBULATORY_CARE_PROVIDER_SITE_OTHER): Payer: Medicare HMO | Admitting: Hematology and Oncology

## 2022-08-14 ENCOUNTER — Encounter: Payer: Self-pay | Admitting: Hematology and Oncology

## 2022-08-14 ENCOUNTER — Other Ambulatory Visit: Payer: Self-pay

## 2022-08-14 ENCOUNTER — Inpatient Hospital Stay: Payer: Medicare HMO | Attending: Oncology

## 2022-08-14 VITALS — BP 143/68 | HR 69 | Temp 97.6°F | Resp 20 | Ht 62.0 in | Wt 154.7 lb

## 2022-08-14 DIAGNOSIS — Z170 Estrogen receptor positive status [ER+]: Secondary | ICD-10-CM | POA: Insufficient documentation

## 2022-08-14 DIAGNOSIS — Z923 Personal history of irradiation: Secondary | ICD-10-CM | POA: Insufficient documentation

## 2022-08-14 DIAGNOSIS — C50412 Malignant neoplasm of upper-outer quadrant of left female breast: Secondary | ICD-10-CM | POA: Insufficient documentation

## 2022-08-14 DIAGNOSIS — D702 Other drug-induced agranulocytosis: Secondary | ICD-10-CM | POA: Diagnosis not present

## 2022-08-14 DIAGNOSIS — J910 Malignant pleural effusion: Secondary | ICD-10-CM | POA: Diagnosis not present

## 2022-08-14 DIAGNOSIS — Z9221 Personal history of antineoplastic chemotherapy: Secondary | ICD-10-CM | POA: Diagnosis not present

## 2022-08-14 DIAGNOSIS — M8589 Other specified disorders of bone density and structure, multiple sites: Secondary | ICD-10-CM | POA: Diagnosis not present

## 2022-08-14 DIAGNOSIS — Z79899 Other long term (current) drug therapy: Secondary | ICD-10-CM | POA: Diagnosis not present

## 2022-08-14 LAB — CBC WITH DIFFERENTIAL (CANCER CENTER ONLY)
Abs Immature Granulocytes: 0.01 10*3/uL (ref 0.00–0.07)
Basophils Absolute: 0.1 10*3/uL (ref 0.0–0.1)
Basophils Relative: 2 %
Eosinophils Absolute: 0 10*3/uL (ref 0.0–0.5)
Eosinophils Relative: 1 %
HCT: 36.6 % (ref 36.0–46.0)
Hemoglobin: 12.4 g/dL (ref 12.0–15.0)
Immature Granulocytes: 0 %
Lymphocytes Relative: 38 %
Lymphs Abs: 1 10*3/uL (ref 0.7–4.0)
MCH: 35.5 pg — ABNORMAL HIGH (ref 26.0–34.0)
MCHC: 33.9 g/dL (ref 30.0–36.0)
MCV: 104.9 fL — ABNORMAL HIGH (ref 80.0–100.0)
Monocytes Absolute: 0.2 10*3/uL (ref 0.1–1.0)
Monocytes Relative: 9 %
Neutro Abs: 1.4 10*3/uL — ABNORMAL LOW (ref 1.7–7.7)
Neutrophils Relative %: 50 %
Platelet Count: 182 10*3/uL (ref 150–400)
RBC: 3.49 MIL/uL — ABNORMAL LOW (ref 3.87–5.11)
RDW: 14.6 % (ref 11.5–15.5)
WBC Count: 2.7 10*3/uL — ABNORMAL LOW (ref 4.0–10.5)
nRBC: 0 % (ref 0.0–0.2)

## 2022-08-14 LAB — CMP (CANCER CENTER ONLY)
ALT: 10 U/L (ref 0–44)
AST: 16 U/L (ref 15–41)
Albumin: 3.5 g/dL (ref 3.5–5.0)
Alkaline Phosphatase: 35 U/L — ABNORMAL LOW (ref 38–126)
Anion gap: 9 (ref 5–15)
BUN: 17 mg/dL (ref 8–23)
CO2: 28 mmol/L (ref 22–32)
Calcium: 9.3 mg/dL (ref 8.9–10.3)
Chloride: 105 mmol/L (ref 98–111)
Creatinine: 0.84 mg/dL (ref 0.44–1.00)
GFR, Estimated: >60 mL/min (ref >60)
Glucose, Bld: 98 mg/dL (ref 70–99)
Potassium: 4.2 mmol/L (ref 3.5–5.1)
Sodium: 142 mmol/L (ref 135–145)
Total Bilirubin: 0.6 mg/dL (ref 0.3–1.2)
Total Protein: 6.8 g/dL (ref 6.5–8.1)

## 2022-08-14 NOTE — Assessment & Plan Note (Addendum)
Osteopenia, for which she has been receiving denosumab every 6 months.  Her last dose was in September.  She continues calcium and vitamin-D as recommended. Bone density scan still has not been done, so I will try again to get that scheduled.

## 2022-08-14 NOTE — Progress Notes (Signed)
Sent in re-enrollment application to [place] for patients Carrie Wilkerson, this will be for 2024.

## 2022-08-14 NOTE — Assessment & Plan Note (Signed)
She has recurrent neutropenia, but just completed a cycle of palbociclib.  We will need to check her CBC again next week prior to starting another cycle.

## 2022-08-14 NOTE — Progress Notes (Signed)
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 CT chest in December revealed right middle and lower lobe collapse with large right pleural effusion. No central obstructing mass lesion evident. No obvious right-sided pleural disease although there is a irregular focus of soft tissue attenuation along the medial right upper lobe pleura/right anterior mediastinum. Scattered tiny bilateral pulmonary nodules measuring up to 4 mm.  PET was negative.  She required thoracentesis on 2 occasions, the last in January.  Cytology revealed malignant cells consistent with breast primary, ER/PR positive.  She was started on anastrozole 1 mg daily on January 5.  Oral chemotherapy with palbociclib 125 mg daily, for 3 weeks on and 1 week off, was added in February.  After 1 cycle, she developed severe neutropenia. Her 2nd cycle was postponed 1 week.  Prior to a 3rd cycle, she had recurrent neutropenia, so her chemotherapy was held for 2 weeks and the palbociclib dose reduced to 100 mg daily.  She had persistent neutropenia causing delay in her 5th cycle.  CT chest, abdomen and pelvis in July revealed a decrease in the large right pleural effusion and compressive right lung atelectasis.  No new or progressive disease was seen within the chest, abdomen or pelvis.  The CA 27-29 had decreased, but it has not been checked recently.  Due to persistent neutropenia, palbociclib was reduced to 75 mg daily.  She has mild neutropenia, but is just finishing an 8th cycle of palbociclib.  She knows to continue anastrozole daily.  I will add a CA 27-29 today.  I will plan to repeat a CBC in 1 week prior to  resuming palbociclib 75 mg daily.  She does not have evidence of recurrent pleural effusion.  We will plan to see her back in 5 weeks with a CBC and comprehensive panel prior to an 9th cycle of palbociclib.     Other specified disorders of bone density and structure, multiple sites Osteopenia, for which she has been receiving denosumab every 6 months.  Her last dose was in September.  She continues calcium and vitamin-D as recommended. Bone density scan still has not been done, so I will try again to get that scheduled.  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  She now has recurrent disease.  Drug-induced neutropenia ([place]) She has recurrent neutropenia, but just completed a cycle of palbociclib.  We will need to check her CBC again next week prior to starting another cycle.   The patient understands the plans discussed today and is in agreement with them.  She knows to contact our office if she develops concerns prior to her next appointment.     Carrie Pickles, PA-C  [facility] [address] [place] [address] Dept: [phone] Dept Fax: [phone]   Orders Placed This Encounter  Procedures   CBC with Differential ([place])   CMP ([place] only)   Cancer antigen 27.29    Standing Status:   Future    Number  of Occurrences:   1    Standing Expiration Date:   08/15/2023      CHIEF COMPLAINT:  CC: Recurrent hormone receptor positive breast cancer  Current Treatment:  Palbociclib/anastrozole  HISTORY OF PRESENT ILLNESS:   Expand All Collapse All Number [place]  [address] [place],    [address] [phone]   Clinic Day:   03/27/2022   Referring physician: Ernestene Kiel, MD   ASSESSMENT & PLAN:    Assessment & Plan: Pleural effusion, 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 CT chest from December revealed right middle and lower lobe collapse with large right pleural effusion. No central obstructing mass lesion evident. No obvious right-sided pleural disease although there is a irregular focus of soft tissue attenuation along the medial right upper lobe pleura/right anterior mediastinum. Scattered tiny bilateral pulmonary nodules measuring up to 4 mm.  PET was negative.  She required thoracentesis on 2 occasions, the last in January.  Cytology revealed malignant cells consistent with breast primary, ER/PR positive.  She was started on anastrozole 1 mg daily on January 5.  Oral chemotherapy with palbociclib 125 mg daily, for 3 weeks on and 1 week off, was added in February.  After 1 cycle, she developed severe neutropenia. Her 2nd cycle was postponed 1 week.  Prior to a 3rd cycle, she had recurrent neutropenia, so her chemotherapy was held for 2 weeks and the palbociclib dose reduced to 100 mg daily.  Palbociclib was decreased to 75 mg daily after she had delay in therapy again due to neutropenia.   She is just now completing her 7th cycle of palbociclib.  She does not have evidence of recurrent pleural effusion.  I will repeat a CBC in 1 week to be sure her counts are adequate prior to proceeding with her 5th cycle of palbociclib.  She knows to continue anastrozole daily.  We will see her back in 5 weeks for CBC, comprehensive metabolic panel, CA 40-97 and CT chest, abdomen and pelvis to reassess her disease baseline prior to a 6th cycle.     Other specified disorders of bone density and structure, multiple sites Osteopenia, for which she has been receiving denosumab every 6 months, next due in September.  She continues calcium and  vitamin-D as recommended.  She will be due for repeat bone density in September 2023.   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  She now has recurrent disease.   Drug-induced neutropenia ([place]) She has mild leukopenia, but just completed a cycle of palbociclib.  I will plan to repeat a CBC in 1 week.    The patient understands the plans discussed today and is in agreement with them.  She knows to contact our office if she develops concerns prior to her next appointment.     I provided 20 minutes of face-to-face time during this encounter and > 50% was spent counseling as documented under my assessment and plan.      Carrie Wilkerson, [facility] [address] [place] [address] Dept: [phone] Dept Fax: [phone]         Orders Placed This Encounter  Procedures   CT CHEST ABDOMEN PELVIS W CONTRAST  Standing Status:   Future      Standing Expiration Date:   03/28/2023      Order Specific Question:   Preferred imaging location?      Answer:   External      Comments:   [facility]      Order Specific Question:   Radiology Contrast Protocol - do NOT remove file path      Answer:   [email or web address]   CBC and differential      This external order was created through the Results Console.   CBC      This external order was created through the Results Console.   CBC      This order was created through External Result Entry   Basic metabolic panel      This external order was created through the Results Console.   Comprehensive metabolic panel      This external order was created through the Results Console.   Hepatic function panel      This external order was  created through the Results Console.   Cancer antigen 27.29      Standing Status:   Future      Standing Expiration Date:   03/28/2023        CHIEF COMPLAINT:  CC: Recurrent hormone receptor positive breast cancer right pleural effusion   Current Treatment: Anastrozole/palbociclib   HISTORY OF PRESENT ILLNESS:  Carrie Wilkerson is a 81 Y.O. 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 She was treated with lumpectomy.  Pathology revealed a 1.5 cm, grade 1, invasive ductal carcinoma with 5 positive nodes.  Estrogen and progesterone receptors were positive and Her-2 Neu negative.  She received adjuvant chemotherapy with Adriamycin/Cytoxan for 3 months, followed by Taxol for 3 months.  She then received adjuvant radiation to the left breast.  She was placed on letrozole in April 2008 and completed 10 years of adjuvant hormonal therapy in April 2018.  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   Repeat bone density in September 2017 revealed slight improvement in the bone density, with a T-score of -2.1 in the forearm and a T-score of -1.1 in the femur with, so she has continued denosumab every 6 months, in addition to calcium/vitamin D.  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    She had reversal of her colostomy in October 2017.  She has an incisional hernia in the abdomen, which does not bother her, so she has decided against surgical repair.  She has atrial fibrillation and is on apixaban 5 mg twice daily.  Bone density scan in September 2019 revealed improvement in the bone density with a T-score of -1.6 in forearm.  The bone density of the femur had normalized with a T-score of -0.8.  She has continued denosumab every 6 months.  Annual  bilateral mammogram in December 2020 did not reveal any evidence of malignancy.  She contracted COVID-19 later in December 2020.  She did not have to be hospitalized and fully recovered.  Bone density from September 2021 revealed mildly worsened osteopenia with a T-score of -2.0 of the left forearm radius, previously -1.6.  Left femur neck is normal at -0.9, previously -0.8.  She continues  calcium 1200 mg with vitamin-D daily.   She presented to the emergency department in December 2022 due to shortness of breath that had been ongoing for the past 3 weeks. She was also having some left substernal area pain. ECHO revealed a normal EF between 60-65%. CT chest revealed right middle and lower lobe collapse with large right pleural effusion. There was no central obstructing mass lesion evident, or obvious right-sided pleural disease, although there is a irregular focus of soft tissue attenuation along the medial right upper lobe pleura/right anterior mediastinum. There were scattered tiny bilateral pulmonary nodules measuring up to 4 mm. Thoracentesis was pursued yielding 1200 cc's of pleural fluid. Cytopathology confirmed malignant cells consistent with breast primary. CKAE1/3, GATA-3 and ER positive. HER2 was negative 1+. Estrogen receptor was positive at 90% and progesterone receptor was positive at 20%. Ki67 was <1%. MRI brain was negative for evidence of metastasis. PET imaging in January did not reveal any hypermetabolic activity to localize active metastatic breast carcinoma. Large right pleural effusion occupied 75% of the right hemithorax. Along the anteromedial margin of the right diaphragm there is hypermetabolic linear thickening of the diaphragm.  She had a second thoracentesis in January.   She was placed on anastrozole and palbociclib.  She has not required further thoracentesis.  She has completed 4 cycles of palbociclib.  The dose was reduced to 100 mg daily with the third cycle due to neutropenia.   The CA 27-29 had decreased from 123.6 to 40.4.  The dose was decreased again to 75 mg daily for 3 weeks on and 1 week off with her fifth cycle due to neutropenia.     Oncology History  Malignant neoplasm of upper-outer quadrant of left female breast ([place])  03/07/2006 Initial Diagnosis   Breast cancer, left breast ([place])   03/17/2006 Cancer Staging   Staging form: Breast, AJCC 6th Edition - Clinical stage from 03/17/2006: Stage IIIA (T1c, N2a, M0) - Signed by Derwood Kaplan, MD on 12/15/2020 Staged by: Managing physician Diagnostic confirmation: Positive histology Specimen type: Excision Histopathologic type: Infiltrating duct carcinoma, NOS Tumor size (mm): 15 Laterality: Left Total positive nodes: 5 Histologic grade (G): G1 Residual tumor (R): R0 - None Stage prefix: Initial diagnosis Lymphatic vessel invasion (L): L0 - No lymphatic vessel invasion Venous invasion (V): V0 - No venous invasion Prognostic indicators: ER/PR pos, HER 2 neg., Rx with AC x 3 mo, then Taxol x 3 mo.,AI x 10 yrs       INTERVAL HISTORY:  Carrie Wilkerson is here today for repeat clinical assessment prior to 8th cycle of palbociclib, however due to another delay in therapy she just completed palbociclib today.  She states continues to tolerate this well.  She states she continues anastrozole daily without difficulty.  Her only complaint is that her fingernails are dark.  This is most likely due to her treatment, but there is nothing to do about it.  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  She denies fevers or chills. She denies pain. Her appetite is good. Her weight has decreased 3 pounds over last 5 weeks .  REVIEW OF SYSTEMS:  Review of Systems  Constitutional:  Negative for appetite change, chills, fatigue, fever and unexpected weight change.  HENT:   Negative for lump/mass, mouth sores and sore throat.   Respiratory:  Negative  for cough and shortness of breath.   Cardiovascular:  Negative for chest pain and  leg swelling.  Gastrointestinal:  Negative for abdominal pain, constipation, diarrhea, nausea and vomiting.  Endocrine: Negative for hot flashes.  Genitourinary:  Negative for difficulty urinating, dysuria, frequency and hematuria.   Musculoskeletal:  Negative for arthralgias, back pain and myalgias.  Skin:  Negative for rash.  Neurological:  Negative for dizziness and headaches.  Hematological:  Negative for adenopathy. Does not bruise/bleed easily.  Psychiatric/Behavioral:  Negative for depression and sleep disturbance. The patient is not nervous/anxious.      VITALS:  Blood pressure (!) 143/68, pulse 69, temperature 97.6 F (36.4 C), temperature source Oral, resp. rate 20, height _0  (1.575 m), weight 154 lb 11.2 oz (70.2 kg), SpO2 95 %.  Wt Readings from Last 3 Encounters:  08/14/22 154 lb 11.2 oz (70.2 kg)  07/10/22 157 lb 1.6 oz (71.3 kg)  06/12/22 156 lb 4.8 oz (70.9 kg)    Body mass index is 28.3 kg/m.  Performance status (ECOG): 0 - Asymptomatic  PHYSICAL EXAM:  Physical Exam Vitals and nursing note reviewed.  Constitutional:      General: She is not in acute distress.    Appearance: Normal appearance.  HENT:     Head: Normocephalic and atraumatic.     Mouth/Throat:     Mouth: Mucous membranes are moist.     Pharynx: Oropharynx is clear. No oropharyngeal exudate or posterior oropharyngeal erythema.  Eyes:     General: No scleral icterus.    Extraocular Movements: Extraocular movements intact.     Conjunctiva/sclera: Conjunctivae normal.     Pupils: Pupils are equal, round, and reactive to light.  Cardiovascular:     Rate and Rhythm: Normal rate and regular rhythm.     Heart sounds: Normal heart sounds. No murmur heard.    No friction rub. No gallop.  Pulmonary:     Effort: Pulmonary effort is normal.     Breath sounds: Normal breath sounds. No wheezing, rhonchi or rales.   Chest:  Breasts:    Right: Normal. No swelling, bleeding, mass, nipple discharge, skin change or tenderness.     Left: Normal. No swelling, bleeding, mass, nipple discharge, skin change or tenderness.  Abdominal:     General: There is no distension.     Palpations: Abdomen is soft. There is no hepatomegaly, splenomegaly or mass.     Tenderness: There is no abdominal tenderness.  Musculoskeletal:        General: Normal range of motion.     Cervical back: Normal range of motion and neck supple. No tenderness.     Right lower leg: No edema.     Left lower leg: No edema.  Lymphadenopathy:     Cervical: No cervical adenopathy.     Upper Body:     Right upper body: No supraclavicular or axillary adenopathy.     Left upper body: No supraclavicular or axillary adenopathy.     Lower Body: No right inguinal adenopathy. No left inguinal adenopathy.  Skin:    General: Skin is warm and dry.     Coloration: Skin is not jaundiced.     Findings: No rash.     Comments: There is darkening of the proximal third of the fingernails without tenderness.  Neurological:     Mental Status: She is alert and oriented to person, place, and time.     Cranial Nerves: No cranial nerve deficit.  Psychiatric:        Mood and Affect: Mood normal.        Behavior:  Behavior normal.        Thought Content: Thought content normal.     LABS:      Latest Ref Rng & Units 08/14/2022    9:44 AM 07/24/2022   12:00 AM 07/17/2022   12:00 AM  CBC  WBC 4.0 - 10.5 K/uL 2.7  3.4     2.6      Hemoglobin 12.0 - 15.0 g/dL 12.4  12.5     12.7      Hematocrit 36.0 - 46.0 % 36.6  36     37      Platelets 150 - 400 K/uL 182  232     187         This result is from an external source.      Latest Ref Rng & Units 08/14/2022    9:44 AM 07/17/2022   12:00 AM 07/10/2022   12:00 AM  CMP  Glucose 70 - 99 mg/dL 98     BUN 8 - 23 mg/dL _0 Creatinine 0.44 - 1.00 mg/dL 0.84  0.7     0.8      Sodium 135 - 145  mmol/L 142  142     142      Potassium 3.5 - 5.1 mmol/L 4.2  3.9     4.0      Chloride 98 - 111 mmol/L 105  109     108      CO2 22 - 32 mmol/L _1 Calcium 8.9 - 10.3 mg/dL 9.3  9.2     9.4      Total Protein 6.5 - 8.1 g/dL 6.8     Total Bilirubin 0.3 - 1.2 mg/dL 0.6     Alkaline Phos 38 - 126 U/L 35  37     38      AST 15 - 41 U/L 16  36     25      ALT 0 - 44 U/L _2 This result is from an external source.     Lab Results  Component Value Date   CEA1 1.6 10/11/2021   /  CEA  Date Value Ref Range Status  10/11/2021 1.6 0.0 - 4.7 ng/mL Final    Comment:    (NOTE)                             Nonsmokers          <3.9                             Smokers             <5.6 Roche [employer] Electrochemiluminescence Immunoassay (ECLIA) Values obtained with different assay methods or kits cannot be used interchangeably.  Results cannot be interpreted as absolute evidence of the presence or absence of malignant disease. Performed At: [facility] [place], [place] [id number] Rush Farmer MD YD:[id number]    No results found for: "PSA1" No results found for: "CAN199" No results found for: "CAN125"  No results found for: "TOTALPROTELP", "ALBUMINELP", "A1GS", "A2GS", "BETS", "BETA2SER", "GAMS", "MSPIKE", "SPEI" No results found for: "TIBC", "FERRITIN", "IRONPCTSAT" No results found for: "LDH"  STUDIES:  No results found.    HISTORY:   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 ([place]) 05/27/2016   Breast cancer, left breast ([place]) 03/07/2006   Cancer ([place])    Hx: of breast cancer in 2007   Degenerative arthritis of hip 04/13/2013   Drug-induced neutropenia ([place]) 01/02/2022   Family history of breast cancer 11/09/2021   Family history of malignant neoplasm of digestive organ 11/09/2021   Family history of malignant neoplasm of other genital organs 11/09/2021   Genetic testing 11/30/2021    Negative hereditary cancer genetic testing: no pathogenic variants detected in [facility] +RNAinsight Panel.  Report date is 11/29/21.   The [facility]+RNAinsight panel offered by [facility] includes sequencing and rearrangement analysis for the following 47 genes:  APC, ATM, AXIN2, BARD1, BMPR1A, BRCA1, BRCA2, BRIP1, CDH1, CDK4, CDKN2A, CHEK2, DICER1, EPCAM, GREM1, HOXB13,    History of left breast cancer 06/13/2021   Hypotension 06/13/2021   Incisional hernia, without obstruction or gangrene 10/22/2016   Malignant neoplasm of upper-outer quadrant of left female breast ([place]) 03/07/2006   Mixed dyslipidemia 11/03/2018   Numbness in right leg    Hx: of   Obesity (BMI 30.0-34.9) 09/21/2020   Other specified disorders of bone density and structure, multiple sites 09/20/2020   Paroxysmal atrial fibrillation ([place]) 02/13/2016   Personal history of COVID-19 09/2019   Pleural effusion, 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   Pneumonia    Hx: of 'a long time ago"   Postoperative examination 02/05/2016    Past Surgical History:  Procedure Laterality Date   APPENDECTOMY     BACK SURGERY     BREAST SURGERY     Hx: of lumpectomy left breast 2007   COLONOSCOPY     Hx: of   DILATION AND CURETTAGE OF UTERUS     TONSILLECTOMY     TOTAL HIP ARTHROPLASTY Right 04/13/2013   Dr Ninfa Linden   TOTAL HIP ARTHROPLASTY Right 04/13/2013   Procedure: RIGHT TOTAL HIP ARTHROPLASTY ANTERIOR APPROACH;  Surgeon: Mcarthur Rossetti, MD;  Location: [facility];  Service: Orthopedics;  Laterality: Right;   TUBAL LIGATION      Family History  Problem Relation Age of Onset   Hypertension Sister    Cancer Sister 43       uterine or ovarian   Bone cancer Sister 18   Breast cancer Sister        dx 15 and 64   Uterine cancer Sister 20   Colon cancer Sister 17   Breast cancer Sister 7   Hypertension Brother    Kidney cancer Brother 34   Cancer Brother 36       unknown type; mets   Liver cancer Brother    Liver cancer  Brother 9   Pancreatic cancer Niece 34   Breast cancer Niece 63   Colon cancer Nephew 102       mets    Social History:  reports that she has never smoked. She has never used smokeless tobacco. She reports that she does not drink alcohol and does not use drugs.The patient is alone today.  Allergies:  Allergies  Allergen Reactions   Codeine     unknown    Sulfa Antibiotics     unknown    Chlorhexidine Rash    Current Medications: Current Outpatient Medications  Medication Sig Dispense Refill   anastrozole (ARIMIDEX) 1 MG tablet Take 1 tablet (1 mg total) by mouth daily. 90 tablet 3  apixaban (ELIQUIS) 5 MG TABS tablet Take 1 tablet (5 mg total) by mouth 2 (two) times daily. 180 tablet 3   atorvastatin (LIPITOR) 10 MG tablet Take 1 tablet by mouth every other day.  4   CALCIUM PO Take 1 tablet by mouth daily.     cholecalciferol (VITAMIN D) 1000 UNITS tablet Take 1,000 Units by mouth daily.     denosumab (PROLIA) 60 MG/ML SOLN injection Inject 60 mg into the skin every 6 (six) months. Administer in upper arm, thigh, or abdomen     Flaxseed, Linseed, OIL Take 1 capsule by mouth daily.     glucosamine-chondroitin 500-400 MG tablet Take 1 tablet by mouth 2 (two) times daily.      levothyroxine (SYNTHROID, LEVOTHROID) 75 MCG tablet Take 75 mcg by mouth daily.   7   metoprolol tartrate (LOPRESSOR) 25 MG tablet Take 1 tablet (25 mg total) by mouth 2 (two) times daily. 180 tablet 3   Multiple Vitamin (MULTIVITAMIN WITH MINERALS) TABS Take 1 tablet by mouth daily.     ondansetron (ZOFRAN-ODT) 4 MG disintegrating tablet Take 4 mg by mouth 2 (two) times daily as needed for nausea/vomiting.     palbociclib (IBRANCE) 75 MG capsule Take 75 mg by mouth daily with breakfast. Take whole with food. Take for 21 days on, 7 days off, repeat every 28 days.     No current facility-administered medications for this visit.

## 2022-08-14 NOTE — Assessment & Plan Note (Addendum)
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 CT chest in December revealed right middle and lower lobe collapse with large right pleural effusion. No central obstructing mass lesion evident. No obvious right-sided pleural disease although there is a irregular focus of soft tissue attenuation along the medial right upper lobe pleura/right anterior mediastinum. Scattered tiny bilateral pulmonary nodules measuring up to 4 mm.  PET was negative.  She required thoracentesis on 2 occasions, the last in January.  Cytology revealed malignant cells consistent with breast primary, ER/PR positive.  She was started on anastrozole 1 mg daily on January 5.  Oral chemotherapy with palbociclib 125 mg daily, for 3 weeks on and 1 week off, was added in February.  After 1 cycle, she developed severe neutropenia. Her 2nd cycle was postponed 1 week.  Prior to a 3rd cycle, she had recurrent neutropenia, so her chemotherapy was held for 2 weeks and the palbociclib dose reduced to 100 mg daily.  She had persistent neutropenia causing delay in her 5th cycle.  CT chest, abdomen and pelvis in July revealed a decrease in the large right pleural effusion and compressive right lung atelectasis.  No new or progressive disease was seen within the chest, abdomen or pelvis.  The CA 27-29 had decreased, but it has not been checked recently.  Due to persistent neutropenia, palbociclib was reduced to 75 mg daily.  She has mild neutropenia, but is just finishing an 8th cycle of palbociclib.  She knows to continue anastrozole daily.  I will add a CA 27-29 today.  I will plan to repeat a CBC in 1 week prior to resuming palbociclib 75 mg daily.  She does not have evidence of recurrent pleural effusion.  We will plan to see her back in 5 weeks with a CBC and comprehensive panel prior to an 9th cycle of palbociclib.

## 2022-08-14 NOTE — Assessment & Plan Note (Signed)
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  She now has recurrent disease.

## 2022-08-15 LAB — CANCER ANTIGEN 27.29: CA 27.29: 26 U/mL (ref 0.0–38.6)

## 2022-08-16 ENCOUNTER — Other Ambulatory Visit: Payer: Self-pay | Admitting: Oncology

## 2022-08-16 ENCOUNTER — Telehealth: Payer: Self-pay

## 2022-08-16 DIAGNOSIS — Z170 Estrogen receptor positive status [ER+]: Secondary | ICD-10-CM

## 2022-08-16 NOTE — Telephone Encounter (Signed)
DEXA scheduled for 09/12/22'@10AM'$  at [facility]

## 2022-08-16 NOTE — Telephone Encounter (Signed)
-----   Message from Derwood Kaplan, MD sent at 08/16/2022  9:36 AM EST ----- Regarding: DEXA Did she ever get DEXA this year that [facility] ordered?

## 2022-08-21 ENCOUNTER — Inpatient Hospital Stay: Payer: Medicare HMO

## 2022-08-21 ENCOUNTER — Telehealth: Payer: Self-pay

## 2022-08-21 ENCOUNTER — Other Ambulatory Visit: Payer: Self-pay

## 2022-08-21 DIAGNOSIS — Z79899 Other long term (current) drug therapy: Secondary | ICD-10-CM | POA: Diagnosis not present

## 2022-08-21 DIAGNOSIS — C50412 Malignant neoplasm of upper-outer quadrant of left female breast: Secondary | ICD-10-CM | POA: Diagnosis not present

## 2022-08-21 DIAGNOSIS — Z170 Estrogen receptor positive status [ER+]: Secondary | ICD-10-CM | POA: Diagnosis not present

## 2022-08-21 DIAGNOSIS — J910 Malignant pleural effusion: Secondary | ICD-10-CM | POA: Diagnosis not present

## 2022-08-21 DIAGNOSIS — Z9221 Personal history of antineoplastic chemotherapy: Secondary | ICD-10-CM | POA: Diagnosis not present

## 2022-08-21 DIAGNOSIS — Z923 Personal history of irradiation: Secondary | ICD-10-CM | POA: Diagnosis not present

## 2022-08-21 LAB — CBC WITH DIFFERENTIAL (CANCER CENTER ONLY)
Abs Immature Granulocytes: 0.01 10*3/uL (ref 0.00–0.07)
Basophils Absolute: 0 10*3/uL (ref 0.0–0.1)
Basophils Relative: 2 %
Eosinophils Absolute: 0 10*3/uL (ref 0.0–0.5)
Eosinophils Relative: 2 %
HCT: 37.7 % (ref 36.0–46.0)
Hemoglobin: 12.7 g/dL (ref 12.0–15.0)
Immature Granulocytes: 0 %
Lymphocytes Relative: 41 %
Lymphs Abs: 1.1 10*3/uL (ref 0.7–4.0)
MCH: 35.3 pg — ABNORMAL HIGH (ref 26.0–34.0)
MCHC: 33.7 g/dL (ref 30.0–36.0)
MCV: 104.7 fL — ABNORMAL HIGH (ref 80.0–100.0)
Monocytes Absolute: 0.4 10*3/uL (ref 0.1–1.0)
Monocytes Relative: 14 %
Neutro Abs: 1.1 10*3/uL — ABNORMAL LOW (ref 1.7–7.7)
Neutrophils Relative %: 41 %
Platelet Count: 181 10*3/uL (ref 150–400)
RBC: 3.6 MIL/uL — ABNORMAL LOW (ref 3.87–5.11)
RDW: 14.5 % (ref 11.5–15.5)
WBC Count: 2.6 10*3/uL — ABNORMAL LOW (ref 4.0–10.5)
nRBC: 0 % (ref 0.0–0.2)

## 2022-08-21 NOTE — Telephone Encounter (Signed)
Patient aware will hold ibrance until she hears from [place] after next lab appt. Patient voiced understanding Lab appt sch 1330 on 11/22.

## 2022-08-21 NOTE — Telephone Encounter (Signed)
-----   Message from [name], PA-C sent at 08/21/2022 12:21 PM EST ----- Please let her know white blood cells are lower, so need to hold Ibrance and come next week to recheck CBC. Lab appt scheduled for 1:30 pm on 11/22. Also, tell her her cancer test is normal. Thank you!

## 2022-08-26 DIAGNOSIS — E039 Hypothyroidism, unspecified: Secondary | ICD-10-CM | POA: Diagnosis not present

## 2022-08-26 DIAGNOSIS — I480 Paroxysmal atrial fibrillation: Secondary | ICD-10-CM | POA: Diagnosis not present

## 2022-08-26 DIAGNOSIS — G620 Drug-induced polyneuropathy: Secondary | ICD-10-CM | POA: Diagnosis not present

## 2022-08-26 DIAGNOSIS — Z0000 Encounter for general adult medical examination without abnormal findings: Secondary | ICD-10-CM | POA: Diagnosis not present

## 2022-08-26 DIAGNOSIS — E785 Hyperlipidemia, unspecified: Secondary | ICD-10-CM | POA: Diagnosis not present

## 2022-08-26 DIAGNOSIS — C50412 Malignant neoplasm of upper-outer quadrant of left female breast: Secondary | ICD-10-CM | POA: Diagnosis not present

## 2022-08-26 DIAGNOSIS — Z7189 Other specified counseling: Secondary | ICD-10-CM | POA: Diagnosis not present

## 2022-08-26 DIAGNOSIS — Z6829 Body mass index (BMI) 29.0-29.9, adult: Secondary | ICD-10-CM | POA: Diagnosis not present

## 2022-08-26 DIAGNOSIS — Z1331 Encounter for screening for depression: Secondary | ICD-10-CM | POA: Diagnosis not present

## 2022-08-26 DIAGNOSIS — D6869 Other thrombophilia: Secondary | ICD-10-CM | POA: Diagnosis not present

## 2022-08-28 ENCOUNTER — Inpatient Hospital Stay: Payer: Medicare HMO

## 2022-08-28 DIAGNOSIS — Z170 Estrogen receptor positive status [ER+]: Secondary | ICD-10-CM | POA: Diagnosis not present

## 2022-08-28 DIAGNOSIS — Z79899 Other long term (current) drug therapy: Secondary | ICD-10-CM | POA: Diagnosis not present

## 2022-08-28 DIAGNOSIS — Z9221 Personal history of antineoplastic chemotherapy: Secondary | ICD-10-CM | POA: Diagnosis not present

## 2022-08-28 DIAGNOSIS — C50412 Malignant neoplasm of upper-outer quadrant of left female breast: Secondary | ICD-10-CM | POA: Diagnosis not present

## 2022-08-28 DIAGNOSIS — J910 Malignant pleural effusion: Secondary | ICD-10-CM | POA: Diagnosis not present

## 2022-08-28 DIAGNOSIS — Z923 Personal history of irradiation: Secondary | ICD-10-CM | POA: Diagnosis not present

## 2022-08-28 LAB — CBC WITH DIFFERENTIAL (CANCER CENTER ONLY)
Abs Immature Granulocytes: 0.02 10*3/uL (ref 0.00–0.07)
Basophils Absolute: 0.1 10*3/uL (ref 0.0–0.1)
Basophils Relative: 3 %
Eosinophils Absolute: 0.1 10*3/uL (ref 0.0–0.5)
Eosinophils Relative: 2 %
HCT: 35.4 % — ABNORMAL LOW (ref 36.0–46.0)
Hemoglobin: 11.7 g/dL — ABNORMAL LOW (ref 12.0–15.0)
Immature Granulocytes: 1 %
Lymphocytes Relative: 35 %
Lymphs Abs: 1 10*3/uL (ref 0.7–4.0)
MCH: 34.7 pg — ABNORMAL HIGH (ref 26.0–34.0)
MCHC: 33.1 g/dL (ref 30.0–36.0)
MCV: 105 fL — ABNORMAL HIGH (ref 80.0–100.0)
Monocytes Absolute: 0.5 10*3/uL (ref 0.1–1.0)
Monocytes Relative: 18 %
Neutro Abs: 1.2 10*3/uL — ABNORMAL LOW (ref 1.7–7.7)
Neutrophils Relative %: 41 %
Platelet Count: 254 10*3/uL (ref 150–400)
RBC: 3.37 MIL/uL — ABNORMAL LOW (ref 3.87–5.11)
RDW: 14.6 % (ref 11.5–15.5)
WBC Count: 2.8 10*3/uL — ABNORMAL LOW (ref 4.0–10.5)
nRBC: 0 % (ref 0.0–0.2)

## 2022-08-28 LAB — CMP (CANCER CENTER ONLY)
ALT: 16 U/L (ref 0–44)
AST: 20 U/L (ref 15–41)
Albumin: 3.4 g/dL — ABNORMAL LOW (ref 3.5–5.0)
Alkaline Phosphatase: 32 U/L — ABNORMAL LOW (ref 38–126)
Anion gap: 6 (ref 5–15)
BUN: 19 mg/dL (ref 8–23)
CO2: 27 mmol/L (ref 22–32)
Calcium: 9.4 mg/dL (ref 8.9–10.3)
Chloride: 105 mmol/L (ref 98–111)
Creatinine: 0.69 mg/dL (ref 0.44–1.00)
GFR, Estimated: >60 mL/min (ref >60)
Glucose, Bld: 98 mg/dL (ref 70–99)
Potassium: 3.8 mmol/L (ref 3.5–5.1)
Sodium: 138 mmol/L (ref 135–145)
Total Bilirubin: 0.4 mg/dL (ref 0.3–1.2)
Total Protein: 6.6 g/dL (ref 6.5–8.1)

## 2022-09-02 ENCOUNTER — Telehealth: Payer: Self-pay

## 2022-09-02 NOTE — Telephone Encounter (Addendum)
09/02/2022  Dr Hinton Rao: Can tell Akesha she is okay to start back on the [place]. Destane Speas will have CBC done on 12/1 and see me in 2 weeks.    11/23 - Dr Bobby Rumpf was provider on call. He told pt that her WBCs were still low, and to hold the Ibrance until Monday, 09/02/2022.  Please advise.

## 2022-09-03 ENCOUNTER — Encounter: Payer: Self-pay | Admitting: Oncology

## 2022-09-06 ENCOUNTER — Inpatient Hospital Stay: Payer: Medicare HMO | Attending: Oncology

## 2022-09-06 DIAGNOSIS — D709 Neutropenia, unspecified: Secondary | ICD-10-CM | POA: Diagnosis not present

## 2022-09-06 DIAGNOSIS — C50412 Malignant neoplasm of upper-outer quadrant of left female breast: Secondary | ICD-10-CM | POA: Diagnosis not present

## 2022-09-06 DIAGNOSIS — Z170 Estrogen receptor positive status [ER+]: Secondary | ICD-10-CM | POA: Diagnosis not present

## 2022-09-06 DIAGNOSIS — J910 Malignant pleural effusion: Secondary | ICD-10-CM | POA: Diagnosis not present

## 2022-09-06 DIAGNOSIS — Z79899 Other long term (current) drug therapy: Secondary | ICD-10-CM | POA: Diagnosis not present

## 2022-09-06 LAB — LIPID PANEL
Cholesterol: 200 mg/dL (ref 0–200)
HDL: 44 mg/dL (ref >40)
LDL Cholesterol: 127 mg/dL — ABNORMAL HIGH (ref 0–99)
Total CHOL/HDL Ratio: 4.5 RATIO
Triglycerides: 144 mg/dL (ref <150)
VLDL: 29 mg/dL (ref 0–40)

## 2022-09-06 LAB — CBC WITH DIFFERENTIAL (CANCER CENTER ONLY)
Abs Immature Granulocytes: 0.01 10*3/uL (ref 0.00–0.07)
Basophils Absolute: 0.1 10*3/uL (ref 0.0–0.1)
Basophils Relative: 2 %
Eosinophils Absolute: 0.1 10*3/uL (ref 0.0–0.5)
Eosinophils Relative: 2 %
HCT: 39.1 % (ref 36.0–46.0)
Hemoglobin: 12.6 g/dL (ref 12.0–15.0)
Immature Granulocytes: 0 %
Lymphocytes Relative: 29 %
Lymphs Abs: 1.3 10*3/uL (ref 0.7–4.0)
MCH: 33.7 pg (ref 26.0–34.0)
MCHC: 32.2 g/dL (ref 30.0–36.0)
MCV: 104.5 fL — ABNORMAL HIGH (ref 80.0–100.0)
Monocytes Absolute: 0.5 10*3/uL (ref 0.1–1.0)
Monocytes Relative: 11 %
Neutro Abs: 2.6 10*3/uL (ref 1.7–7.7)
Neutrophils Relative %: 56 %
Platelet Count: 362 10*3/uL (ref 150–400)
RBC: 3.74 MIL/uL — ABNORMAL LOW (ref 3.87–5.11)
RDW: 13.8 % (ref 11.5–15.5)
WBC Count: 4.6 10*3/uL (ref 4.0–10.5)
nRBC: 0 % (ref 0.0–0.2)

## 2022-09-12 ENCOUNTER — Encounter: Payer: Self-pay | Admitting: Oncology

## 2022-09-12 DIAGNOSIS — M8589 Other specified disorders of bone density and structure, multiple sites: Secondary | ICD-10-CM | POA: Diagnosis not present

## 2022-09-12 DIAGNOSIS — M85832 Other specified disorders of bone density and structure, left forearm: Secondary | ICD-10-CM | POA: Diagnosis not present

## 2022-09-17 NOTE — Progress Notes (Signed)
[facility]  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 CT chest in December revealed right middle and lower lobe collapse with large right pleural effusion. No central obstructing mass lesion evident. No obvious right-sided pleural disease although there is a irregular focus of soft tissue attenuation along the medial right upper lobe pleura/right anterior mediastinum. Scattered tiny bilateral pulmonary nodules measuring up to 4 mm.  PET was negative.  She required thoracentesis on 2 occasions, the last in January.  Cytology revealed malignant cells consistent with breast primary, ER/PR positive.  She was started on anastrozole 1 mg daily on January 5.  Oral chemotherapy with palbociclib 125 mg daily, for 3 weeks on and 1 week off, was added in February.  After 1 cycle, she developed severe neutropenia. Her 2nd cycle was postponed 1 week.  Prior to a 3rd cycle, she had recurrent neutropenia, so her chemotherapy was held for 2 weeks and the palbociclib dose reduced to 100 mg daily.  She had persistent neutropenia and the dose was decreased to 30m daily.  CT chest, abdomen and pelvis on July 24 revealed a decrease in the large right pleural effusion and compressive right lung atelectasis.  No new or progressive disease was seen within the chest, abdomen or pelvis.  Neutropenia She has had persistent and recurrent neutropenia despite dose reduction and she is now down to 75 mg daily for 3 weeks on and 1 week off. After a delay of almost 3 weeks because of her low WBC.  She is just finishing up this cycle with 1 more pill to take. She knows to continue anastrozole daily.       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  She now has recurrent disease.   Other specified disorders of bone density and structure, multiple sites Osteopenia, for which she has been receiving denosumab every 6 months, next due in September.  She continues calcium and vitamin-D as recommended.  She will be due for repeat bone density in September, which I will schedule.  We will hold her next dose of denosumab until after her bone density scan.  Lymphedema This is a new finding of her left upper extremity and 1+ at this time.  I gave her some literature about the diagnosis of lymphedema.  I find no adenopathy or tumor of the axilla.      Plan  We will plan her next CT scan in January, 2024. I'm changing her schedule for Palbociclib by 3 weeks on and 2 weeks off. She will have a CBC in 3 weeks. I will see her in 5 weeks with CBC, CMP, and CT scans of chest, abdomin, and pelvis. The patient understands the plans discussed today and is in agreement with them.  She knows to contact our office if she develops concerns prior to her next appointment.    I provided 17 minutes of face-to-face time during this encounter and > 50% was spent counseling as documented under my assessment and plan.      CDerwood Kaplan MD  [place]  [place] [address] [place] [address] Dept: [phone] Dept Fax: [phone]   No orders of the defined types were placed in this encounter.     CHIEF COMPLAINT:  CC: Recurrent hormone receptor positive breast cancer, as well as osteoporosis  Current Treatment: Palbociclib/anastrozole with denosumab every 6 months  HISTORY OF PRESENT ILLNESS:   Oncology History  Malignant neoplasm of  upper-outer quadrant of left female breast ([place])  03/07/2006 Initial Diagnosis   Breast cancer, left breast ([place])   03/17/2006 Cancer Staging   Staging form: Breast, AJCC 6th Edition - Clinical stage from 03/17/2006: Stage IIIA (T1c, N2a, M0) - Signed by Derwood Kaplan, MD on 12/15/2020 Staged by: Managing physician Diagnostic confirmation: Positive histology Specimen type: Excision Histopathologic type: Infiltrating duct carcinoma, NOS Tumor size (mm): 15 Laterality: Left Total positive nodes: 5 Histologic grade (G): G1 Residual tumor (R): R0 - None Stage prefix: Initial diagnosis Lymphatic vessel invasion (L): L0 - No lymphatic vessel invasion Venous invasion (V): V0 - No venous invasion Prognostic indicators: ER/PR pos, HER 2 neg., Rx with AC x 3 mo, then Taxol x 3 mo.,AI x 10 yrs       INTERVAL HISTORY:  Edilia is here today for repeat clinical assessment of recurrent hormone receptor positive breast cancer, as well as osteoporosis. Patient states that she is ok and that her breathing is doing well. Her bone density has worsened slightly and shows osteopenia but her hip has improved. She has decreased breath sounds of the right lower base. She is on her 2nd day of the 3rd week of the 9th cycle of Palbociclib 82m after a delay of almost 3 weeks because of her low WBC. I'm changing her schedule for Palbociclib to 3 weeks on and 2 weeks off. She states she continues anastrozole daily without difficulty. She denies signs of infection such as sore throat, sinus drainage, cough, or urinary symptoms.  She denies fevers or recurrent chills. She denies pain. She denies nausea, vomiting, chest pain, dyspnea or cough. Her weight has increased 5 pounds over last month .   REVIEW OF SYSTEMS:  Review of Systems  Constitutional:  Negative for appetite change, chills, fatigue, fever and unexpected weight change.  HENT:  Negative.  Negative for lump/mass, mouth sores and sore throat.   Eyes:  Negative.   Respiratory: Negative.  Negative for chest tightness, cough, hemoptysis, shortness of breath and wheezing.   Cardiovascular: Negative.  Negative for chest pain, leg swelling and palpitations.  Gastrointestinal: Negative.  Negative for abdominal distention, abdominal pain, blood in stool, constipation, diarrhea, nausea and vomiting.  Endocrine: Negative.  Negative for hot flashes.  Genitourinary: Negative.  Negative for difficulty urinating, dysuria, frequency and hematuria.   Musculoskeletal:  Negative for arthralgias, back pain, flank pain, gait problem and myalgias.  Skin: Negative.  Negative for rash.  Neurological:  Negative for dizziness, extremity weakness, gait problem, headaches, light-headedness, numbness, seizures and speech difficulty.  Hematological: Negative.  Negative for adenopathy. Does not bruise/bleed easily.  Psychiatric/Behavioral: Negative.  Negative for depression and sleep disturbance. The patient is not nervous/anxious.      VITALS:  Blood pressure 131/61, pulse 74, temperature 97.6 F (36.4 C), temperature source Oral, resp. rate 19, height _0  (1.575 m), weight 159 lb 14.4 oz (72.5 kg), SpO2 98 %.  Wt Readings from Last 3 Encounters:  09/18/22 159 lb 14.4 oz (72.5 kg)  08/14/22 154 lb 11.2 oz (70.2 kg)  07/10/22  157 lb 1.6 oz (71.3 kg)    Body mass index is 29.25 kg/m.  Performance status (ECOG): 0 - Asymptomatic  PHYSICAL EXAM:  Physical Exam Constitutional:      General: She is not in acute distress.    Appearance: Normal appearance. She is normal weight.  HENT:     Head: Normocephalic and atraumatic.  Eyes:     General: No scleral icterus.    Extraocular Movements: Extraocular movements intact.     Conjunctiva/sclera: Conjunctivae normal.     Pupils: Pupils are equal, round, and reactive to light.  Cardiovascular:     Rate and Rhythm: Normal rate and regular rhythm.     Pulses: Normal pulses.     Heart sounds: Normal heart sounds. No  murmur heard.    No friction rub. No gallop.  Pulmonary:     Effort: Pulmonary effort is normal. No respiratory distress.     Breath sounds: Examination of the right-lower field reveals decreased breath sounds. Decreased breath sounds present.  Chest:     Comments: 1-2+ lymphedema of the left upper extremity. Well healed scar at the lower outter qadrant on the left breast. Mild fibrocystic changes of the right breast. No masses in either breast.  Abdominal:     General: Bowel sounds are normal. There is no distension.     Palpations: Abdomen is soft. There is no hepatomegaly, splenomegaly or mass.     Tenderness: There is no abdominal tenderness.  Musculoskeletal:        General: Normal range of motion.     Cervical back: Normal range of motion and neck supple.     Right lower leg: No edema.     Left lower leg: No edema.  Lymphadenopathy:     Cervical: No cervical adenopathy.     Right cervical: No superficial, deep or posterior cervical adenopathy.    Left cervical: No superficial, deep or posterior cervical adenopathy.     Upper Body:     Right upper body: No supraclavicular, axillary or pectoral adenopathy.     Left upper body: No supraclavicular, axillary or pectoral adenopathy.  Skin:    General: Skin is warm and dry.  Neurological:     General: No focal deficit present.     Mental Status: She is alert and oriented to person, place, and time. Mental status is at baseline.  Psychiatric:        Mood and Affect: Mood normal.        Behavior: Behavior normal.        Thought Content: Thought content normal.        Judgment: Judgment normal.    LABS:      Latest Ref Rng & Units 10/09/2022    9:34 AM 09/18/2022   10:36 AM 09/06/2022    8:14 AM  CBC  WBC 4.0 - 10.5 K/uL 3.9  2.6  4.6   Hemoglobin 12.0 - 15.0 g/dL 13.0  12.2  12.6   Hematocrit 36.0 - 46.0 % 39.2  37.4  39.1   Platelets 150 - 400 K/uL 315  246  362       Latest Ref Rng & Units 10/09/2022    9:34 AM  09/18/2022   10:36 AM 08/28/2022    1:40 PM  CMP  Glucose 70 - 99 mg/dL 89  80  98   BUN 8 - 23 mg/dL _0 Creatinine 0.44 - 1.00 mg/dL 0.69  0.86  0.69  Sodium 135 - 145 mmol/L 141  142  138   Potassium 3.5 - 5.1 mmol/L 3.9  3.7  3.8   Chloride 98 - 111 mmol/L 107  108  105   CO2 22 - 32 mmol/L _0 Calcium 8.9 - 10.3 mg/dL 9.1  9.1  9.4   Total Protein 6.5 - 8.1 g/dL 7.3  7.0  6.6   Total Bilirubin 0.3 - 1.2 mg/dL 0.6  0.6  0.4   Alkaline Phos 38 - 126 U/L 33  31  32   AST 15 - 41 U/L _1 ALT 0 - 44 U/L _2 Lab Results  Component Value Date   CEA1 1.6 10/11/2021   /  CEA  Date Value Ref Range Status  10/11/2021 1.6 0.0 - 4.7 ng/mL Final    Comment:    (NOTE)                             Nonsmokers          <3.9                             Smokers             <5.6 Roche [employer] Electrochemiluminescence Immunoassay (ECLIA) Values obtained with different assay methods or kits cannot be used interchangeably.  Results cannot be interpreted as absolute evidence of the presence or absence of malignant disease. Performed At: [facility] [place], [place] [id number] Rush Farmer MD GY:[id number]    No results found for: "PSA1" No results found for: "[id number]" No results found for: "CAN125"  No results found for: "TOTALPROTELP", "ALBUMINELP", "A1GS", "A2GS", "BETS", "BETA2SER", "GAMS", "MSPIKE", "SPEI" No results found for: "TIBC", "FERRITIN", "IRONPCTSAT" No results found for: "LDH"  STUDIES:  No results found.           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  Report date is 11/29/21.   The CustomNext-Cancer+RNAinsight panel offered by [facility] includes sequencing and rearrangement analysis for the following 47 genes:  APC, ATM, AXIN2, BARD1, BMPR1A, BRCA1, BRCA2, BRIP1, CDH1, CDK4, CDKN2A, CHEK2, DICER1, EPCAM, GREM1, HOXB13,    History of left breast cancer 06/13/2021   Hypotension 06/13/2021   Incisional hernia, without obstruction or gangrene 10/22/2016   Malignant neoplasm of upper-outer quadrant of left female breast ([place]) 03/07/2006   Mixed dyslipidemia 11/03/2018   Numbness in right leg    Hx: of   Obesity (BMI 30.0-34.9) 09/21/2020   Other specified disorders of bone density and structure, multiple sites 09/20/2020   Paroxysmal atrial fibrillation ([place]) 02/13/2016   Personal history of COVID-19 09/2019   Pleural effusion, 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   Pneumonia    Hx: of 'a  long time ago"   Postoperative examination 02/05/2016    Past Surgical History:  Procedure Laterality Date   APPENDECTOMY     BACK SURGERY     BREAST SURGERY     Hx: of lumpectomy left breast 2007   COLONOSCOPY     Hx: of   DILATION AND CURETTAGE OF UTERUS     TONSILLECTOMY     TOTAL HIP ARTHROPLASTY Right 04/13/2013   Dr Ninfa Linden   TOTAL HIP ARTHROPLASTY Right 04/13/2013   Procedure: RIGHT TOTAL HIP ARTHROPLASTY ANTERIOR APPROACH;  Surgeon: Mcarthur Rossetti, MD;  Location: [facility];  Service: Orthopedics;  Laterality: Right;   TUBAL LIGATION      Family History  Problem Relation Age of Onset   Hypertension Sister    Cancer Sister 34       uterine or ovarian   Bone cancer Sister 55   Breast cancer Sister        dx 44 and 65   Uterine cancer Sister 67   Colon cancer Sister 7   Breast cancer Sister 71   Hypertension Brother     Kidney cancer Brother 79   Cancer Brother 27       unknown type; mets   Liver cancer Brother    Liver cancer Brother 49   Pancreatic cancer Niece 32   Breast cancer Niece 73   Colon cancer Nephew 85       mets    Social History:  reports that she has never smoked. She has never used smokeless tobacco. She reports that she does not drink alcohol and does not use drugs.The patient is alone today.  Allergies:  Allergies  Allergen Reactions   Codeine     unknown    Sulfa Antibiotics     unknown    Chlorhexidine Rash    Current Medications: Current Outpatient Medications  Medication Sig Dispense Refill   anastrozole (ARIMIDEX) 1 MG tablet Take 1 tablet (1 mg total) by mouth daily. 90 tablet 3   apixaban (ELIQUIS) 5 MG TABS tablet Take 1 tablet (5 mg total) by mouth 2 (two) times daily. 180 tablet 3   atorvastatin (LIPITOR) 10 MG tablet Take 1 tablet by mouth every other day.  4   CALCIUM PO Take 1 tablet by mouth daily.     cholecalciferol (VITAMIN D) 1000 UNITS tablet Take 1,000 Units by mouth daily.     denosumab (PROLIA) 60 MG/ML SOLN injection Inject 60 mg into the skin every 6 (six) months. Administer in upper arm, thigh, or abdomen     Flaxseed, Linseed, OIL Take 1 capsule by mouth daily.     glucosamine-chondroitin 500-400 MG tablet Take 1 tablet by mouth 2 (two) times daily.      levothyroxine (SYNTHROID, LEVOTHROID) 75 MCG tablet Take 75 mcg by mouth daily.   7   metoprolol tartrate (LOPRESSOR) 25 MG tablet Take 1 tablet (25 mg total) by mouth 2 (two) times daily. 180 tablet 3   Multiple Vitamin (MULTIVITAMIN WITH MINERALS) TABS Take 1 tablet by mouth daily.     ondansetron (ZOFRAN-ODT) 4 MG disintegrating tablet Take 4 mg by mouth 2 (two) times daily as needed for nausea/vomiting.     palbociclib (IBRANCE) 75 MG capsule Take 75 mg by mouth daily with breakfast. Take whole with food. Take for 21 days on, 7 days off, repeat every 28 days.     No current  facility-administered medications for this visit.      I,Jasmine M  Lassiter,acting as a scribe for Derwood Kaplan, MD.,have documented all relevant documentation on the behalf of Derwood Kaplan, MD,as directed by  Derwood Kaplan, MD while in the presence of Derwood Kaplan, MD.

## 2022-09-18 ENCOUNTER — Encounter: Payer: Self-pay | Admitting: Oncology

## 2022-09-18 ENCOUNTER — Inpatient Hospital Stay: Payer: Medicare HMO | Admitting: Oncology

## 2022-09-18 ENCOUNTER — Other Ambulatory Visit: Payer: Self-pay | Admitting: Oncology

## 2022-09-18 ENCOUNTER — Telehealth: Payer: Self-pay | Admitting: Oncology

## 2022-09-18 ENCOUNTER — Inpatient Hospital Stay: Payer: Medicare HMO

## 2022-09-18 VITALS — BP 131/61 | HR 74 | Temp 97.6°F | Resp 19 | Ht 62.0 in | Wt 159.9 lb

## 2022-09-18 DIAGNOSIS — Z170 Estrogen receptor positive status [ER+]: Secondary | ICD-10-CM | POA: Diagnosis not present

## 2022-09-18 DIAGNOSIS — J910 Malignant pleural effusion: Secondary | ICD-10-CM

## 2022-09-18 DIAGNOSIS — C50412 Malignant neoplasm of upper-outer quadrant of left female breast: Secondary | ICD-10-CM

## 2022-09-18 DIAGNOSIS — Z79899 Other long term (current) drug therapy: Secondary | ICD-10-CM | POA: Diagnosis not present

## 2022-09-18 DIAGNOSIS — D709 Neutropenia, unspecified: Secondary | ICD-10-CM | POA: Diagnosis not present

## 2022-09-18 LAB — CBC WITH DIFFERENTIAL (CANCER CENTER ONLY)
Abs Immature Granulocytes: 0.01 10*3/uL (ref 0.00–0.07)
Basophils Absolute: 0 10*3/uL (ref 0.0–0.1)
Basophils Relative: 2 %
Eosinophils Absolute: 0.1 10*3/uL (ref 0.0–0.5)
Eosinophils Relative: 2 %
HCT: 37.4 % (ref 36.0–46.0)
Hemoglobin: 12.2 g/dL (ref 12.0–15.0)
Immature Granulocytes: 0 %
Lymphocytes Relative: 33 %
Lymphs Abs: 0.9 10*3/uL (ref 0.7–4.0)
MCH: 33.8 pg (ref 26.0–34.0)
MCHC: 32.6 g/dL (ref 30.0–36.0)
MCV: 103.6 fL — ABNORMAL HIGH (ref 80.0–100.0)
Monocytes Absolute: 0.2 10*3/uL (ref 0.1–1.0)
Monocytes Relative: 8 %
Neutro Abs: 1.5 10*3/uL — ABNORMAL LOW (ref 1.7–7.7)
Neutrophils Relative %: 55 %
Platelet Count: 246 10*3/uL (ref 150–400)
RBC: 3.61 MIL/uL — ABNORMAL LOW (ref 3.87–5.11)
RDW: 13.7 % (ref 11.5–15.5)
WBC Count: 2.6 10*3/uL — ABNORMAL LOW (ref 4.0–10.5)
nRBC: 0 % (ref 0.0–0.2)

## 2022-09-18 LAB — CMP (CANCER CENTER ONLY)
ALT: 12 U/L (ref 0–44)
AST: 19 U/L (ref 15–41)
Albumin: 3.7 g/dL (ref 3.5–5.0)
Alkaline Phosphatase: 31 U/L — ABNORMAL LOW (ref 38–126)
Anion gap: 6 (ref 5–15)
BUN: 16 mg/dL (ref 8–23)
CO2: 28 mmol/L (ref 22–32)
Calcium: 9.1 mg/dL (ref 8.9–10.3)
Chloride: 108 mmol/L (ref 98–111)
Creatinine: 0.86 mg/dL (ref 0.44–1.00)
GFR, Estimated: >60 mL/min (ref >60)
Glucose, Bld: 80 mg/dL (ref 70–99)
Potassium: 3.7 mmol/L (ref 3.5–5.1)
Sodium: 142 mmol/L (ref 135–145)
Total Bilirubin: 0.6 mg/dL (ref 0.3–1.2)
Total Protein: 7 g/dL (ref 6.5–8.1)

## 2022-09-18 NOTE — Telephone Encounter (Signed)
09/18/22 Next appt scheduled and confirmed with patient 

## 2022-09-26 ENCOUNTER — Telehealth: Payer: Self-pay | Admitting: Oncology

## 2022-09-26 NOTE — Telephone Encounter (Signed)
09/26/22 Next appt scheduled and confirmed with patient 

## 2022-10-09 ENCOUNTER — Telehealth: Payer: Self-pay

## 2022-10-09 ENCOUNTER — Inpatient Hospital Stay: Payer: Medicare HMO | Attending: Oncology

## 2022-10-09 DIAGNOSIS — Z170 Estrogen receptor positive status [ER+]: Secondary | ICD-10-CM | POA: Diagnosis not present

## 2022-10-09 DIAGNOSIS — J910 Malignant pleural effusion: Secondary | ICD-10-CM | POA: Diagnosis not present

## 2022-10-09 DIAGNOSIS — C50412 Malignant neoplasm of upper-outer quadrant of left female breast: Secondary | ICD-10-CM | POA: Insufficient documentation

## 2022-10-09 LAB — CMP (CANCER CENTER ONLY)
ALT: 20 U/L (ref 0–44)
AST: 26 U/L (ref 15–41)
Albumin: 3.9 g/dL (ref 3.5–5.0)
Alkaline Phosphatase: 33 U/L — ABNORMAL LOW (ref 38–126)
Anion gap: 7 (ref 5–15)
BUN: 14 mg/dL (ref 8–23)
CO2: 27 mmol/L (ref 22–32)
Calcium: 9.1 mg/dL (ref 8.9–10.3)
Chloride: 107 mmol/L (ref 98–111)
Creatinine: 0.69 mg/dL (ref 0.44–1.00)
GFR, Estimated: >60 mL/min (ref >60)
Glucose, Bld: 89 mg/dL (ref 70–99)
Potassium: 3.9 mmol/L (ref 3.5–5.1)
Sodium: 141 mmol/L (ref 135–145)
Total Bilirubin: 0.6 mg/dL (ref 0.3–1.2)
Total Protein: 7.3 g/dL (ref 6.5–8.1)

## 2022-10-09 LAB — CBC WITH DIFFERENTIAL (CANCER CENTER ONLY)
Abs Immature Granulocytes: 0.02 10*3/uL (ref 0.00–0.07)
Basophils Absolute: 0.1 10*3/uL (ref 0.0–0.1)
Basophils Relative: 2 %
Eosinophils Absolute: 0.1 10*3/uL (ref 0.0–0.5)
Eosinophils Relative: 2 %
HCT: 39.2 % (ref 36.0–46.0)
Hemoglobin: 13 g/dL (ref 12.0–15.0)
Immature Granulocytes: 1 %
Lymphocytes Relative: 33 %
Lymphs Abs: 1.3 10*3/uL (ref 0.7–4.0)
MCH: 34.3 pg — ABNORMAL HIGH (ref 26.0–34.0)
MCHC: 33.2 g/dL (ref 30.0–36.0)
MCV: 103.4 fL — ABNORMAL HIGH (ref 80.0–100.0)
Monocytes Absolute: 0.5 10*3/uL (ref 0.1–1.0)
Monocytes Relative: 13 %
Neutro Abs: 1.9 10*3/uL (ref 1.7–7.7)
Neutrophils Relative %: 49 %
Platelet Count: 315 10*3/uL (ref 150–400)
RBC: 3.79 MIL/uL — ABNORMAL LOW (ref 3.87–5.11)
RDW: 14.6 % (ref 11.5–15.5)
WBC Count: 3.9 10*3/uL — ABNORMAL LOW (ref 4.0–10.5)
nRBC: 0 % (ref 0.0–0.2)

## 2022-10-09 NOTE — Telephone Encounter (Signed)
Patient notified of labs and to restart Ibrance.

## 2022-10-09 NOTE — Telephone Encounter (Signed)
-----   Message from Derwood Kaplan, MD sent at 10/09/2022  1:50 PM EST ----- Regarding: call Tell her labs look good, she can go ahead with the Ibrance pills

## 2022-10-10 ENCOUNTER — Encounter: Payer: Self-pay | Admitting: Oncology

## 2022-10-16 ENCOUNTER — Telehealth: Payer: Self-pay | Admitting: Pharmacy Technician

## 2022-10-16 ENCOUNTER — Other Ambulatory Visit (HOSPITAL_COMMUNITY): Payer: Self-pay

## 2022-10-16 ENCOUNTER — Other Ambulatory Visit: Payer: Self-pay | Admitting: Hematology and Oncology

## 2022-10-16 MED ORDER — PALBOCICLIB 75 MG PO CAPS
75.0000 mg | ORAL_CAPSULE | Freq: Every day | ORAL | 5 refills | Status: DC
  Filled 2022-10-16: qty 21, 21d supply, fill #0

## 2022-10-16 NOTE — Telephone Encounter (Signed)
Oral Oncology Patient Advocate Encounter   Was successful in securing patient a $6,000 grant from [employer] to provide copayment coverage for Ibrance.  This will keep the out of pocket expense at $0.     I have spoken with the patient.    The billing information is as follows and has been shared with [place].   Member ID: [id number] Group ID: [id number] RxBin: [id number] PCN: [id number] Dates of Eligibility: 10/16/22 through 10/17/23  Fund name:  [employer].   Carrie Wilkerson, CPhT-Adv Oncology Pharmacy Patient [place] Direct Number: [phone]  Fax: [phone]

## 2022-10-16 NOTE — Telephone Encounter (Signed)
Oral Oncology Patient Advocate Encounter  Prior Authorization for Carrie Wilkerson has been approved.    PA# [id number] Effective dates: 10/16/22 through 10/07/23  Patients co-pay is $3,181.48.    Lady Deutscher, CPhT-Adv Oncology Pharmacy Patient [place] Direct Number: [phone]  Fax: [phone]

## 2022-10-16 NOTE — Telephone Encounter (Signed)
Oral Oncology Patient Advocate Encounter   Received notification that prior authorization for Carrie Wilkerson is due for renewal.   PA submitted on 10/16/22 Key [id number] Status is pending     Carrie Wilkerson, [place] Patient [place] Direct Number: [phone]  Fax: [phone]

## 2022-10-17 ENCOUNTER — Telehealth: Payer: Self-pay

## 2022-10-17 ENCOUNTER — Other Ambulatory Visit (HOSPITAL_COMMUNITY): Payer: Self-pay

## 2022-10-17 DIAGNOSIS — C50412 Malignant neoplasm of upper-outer quadrant of left female breast: Secondary | ICD-10-CM

## 2022-10-17 DIAGNOSIS — Z170 Estrogen receptor positive status [ER+]: Secondary | ICD-10-CM

## 2022-10-17 MED ORDER — PALBOCICLIB 75 MG PO TABS
75.0000 mg | ORAL_TABLET | Freq: Every day | ORAL | 5 refills | Status: DC
  Filled 2022-10-17: qty 21, 21d supply, fill #0

## 2022-10-17 NOTE — Telephone Encounter (Signed)
Oral Chemotherapy Pharmacist Encounter  Prescription for Ibrance capsules sent in by MD. Per MD, okay to change from capsules to tablets.   Drema Halon, PharmD Hematology/Oncology Clinical Pharmacist [facility] [phone]

## 2022-10-22 DIAGNOSIS — H3530 Unspecified macular degeneration: Secondary | ICD-10-CM | POA: Diagnosis not present

## 2022-10-22 DIAGNOSIS — H353131 Nonexudative age-related macular degeneration, bilateral, early dry stage: Secondary | ICD-10-CM | POA: Diagnosis not present

## 2022-10-22 DIAGNOSIS — H25813 Combined forms of age-related cataract, bilateral: Secondary | ICD-10-CM | POA: Diagnosis not present

## 2022-10-22 DIAGNOSIS — H4710 Unspecified papilledema: Secondary | ICD-10-CM | POA: Diagnosis not present

## 2022-10-22 DIAGNOSIS — H35362 Drusen (degenerative) of macula, left eye: Secondary | ICD-10-CM | POA: Diagnosis not present

## 2022-10-22 DIAGNOSIS — H5203 Hypermetropia, bilateral: Secondary | ICD-10-CM | POA: Diagnosis not present

## 2022-10-22 DIAGNOSIS — H52221 Regular astigmatism, right eye: Secondary | ICD-10-CM | POA: Diagnosis not present

## 2022-10-22 DIAGNOSIS — H35363 Drusen (degenerative) of macula, bilateral: Secondary | ICD-10-CM | POA: Diagnosis not present

## 2022-10-22 DIAGNOSIS — H524 Presbyopia: Secondary | ICD-10-CM | POA: Diagnosis not present

## 2022-10-22 DIAGNOSIS — H353111 Nonexudative age-related macular degeneration, right eye, early dry stage: Secondary | ICD-10-CM | POA: Diagnosis not present

## 2022-10-22 DIAGNOSIS — H47332 Pseudopapilledema of optic disc, left eye: Secondary | ICD-10-CM | POA: Diagnosis not present

## 2022-10-23 DIAGNOSIS — K769 Liver disease, unspecified: Secondary | ICD-10-CM | POA: Diagnosis not present

## 2022-10-23 DIAGNOSIS — K6389 Other specified diseases of intestine: Secondary | ICD-10-CM | POA: Diagnosis not present

## 2022-10-23 DIAGNOSIS — C50912 Malignant neoplasm of unspecified site of left female breast: Secondary | ICD-10-CM | POA: Diagnosis not present

## 2022-10-23 DIAGNOSIS — J90 Pleural effusion, not elsewhere classified: Secondary | ICD-10-CM | POA: Diagnosis not present

## 2022-10-23 DIAGNOSIS — K7689 Other specified diseases of liver: Secondary | ICD-10-CM | POA: Diagnosis not present

## 2022-10-23 DIAGNOSIS — C50412 Malignant neoplasm of upper-outer quadrant of left female breast: Secondary | ICD-10-CM | POA: Diagnosis not present

## 2022-10-23 DIAGNOSIS — R918 Other nonspecific abnormal finding of lung field: Secondary | ICD-10-CM | POA: Diagnosis not present

## 2022-10-23 DIAGNOSIS — D259 Leiomyoma of uterus, unspecified: Secondary | ICD-10-CM | POA: Diagnosis not present

## 2022-10-23 DIAGNOSIS — J910 Malignant pleural effusion: Secondary | ICD-10-CM | POA: Diagnosis not present

## 2022-10-23 LAB — HEPATIC FUNCTION PANEL
ALT: 13 U/L (ref 7–35)
AST: 24 (ref 13–35)
Alkaline Phosphatase: 35 (ref 25–125)
Bilirubin, Total: 0.7

## 2022-10-23 LAB — CBC AND DIFFERENTIAL
HCT: 39 (ref 36–46)
Hemoglobin: 13 (ref 12.0–16.0)
Neutrophils Absolute: 1.82
Platelets: 327 10*3/uL (ref 150–400)
WBC: 3.5

## 2022-10-23 LAB — BASIC METABOLIC PANEL
BUN: 21 (ref 4–21)
CO2: 26 — AB (ref 13–22)
Chloride: 103 (ref 99–108)
Creatinine: 0.9 (ref 0.5–1.1)
Glucose: 87
Potassium: 4.4 mEq/L (ref 3.5–5.1)
Sodium: 140 (ref 137–147)

## 2022-10-23 LAB — COMPREHENSIVE METABOLIC PANEL
Albumin: 3.9 (ref 3.5–5.0)
Calcium: 9.8 (ref 8.7–10.7)

## 2022-10-23 LAB — CBC: RBC: 3.89 (ref 3.87–5.11)

## 2022-10-23 NOTE — Progress Notes (Addendum)
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 CT chest in December revealed right middle and lower lobe collapse with large right pleural effusion. No central obstructing mass lesion evident. No obvious right-sided pleural disease although there is a irregular focus of soft tissue attenuation along the medial right upper lobe pleura/right anterior mediastinum. Scattered tiny bilateral pulmonary nodules measuring up to 4 mm.  PET was negative.  She required thoracentesis on 2 occasions, the last in January.  Cytology revealed malignant cells consistent with breast primary, ER/PR positive.  She was started on anastrozole 1 mg daily on January 5.  Oral chemotherapy with palbociclib 125 mg daily, for 3 weeks on and 1 week off, was added in February.  After 1 cycle, she developed severe neutropenia. Her 2nd cycle was postponed 1 week.  Prior to a 3rd cycle, she had recurrent neutropenia, so her chemotherapy was held for 2 weeks and the palbociclib dose reduced to 100 mg daily.  She had persistent neutropenia and the dose was decreased to '75mg'$  daily.  CT chest, abdomen and pelvis on July 24 revealed a decrease in the large right pleural effusion and compressive right lung atelectasis. Now her scan in January, 2024 reveals a new nodule in  the right middle lobe measuring 64m. But more concerning are the new lesions in the liver. The  right pleural effusion remains stable.  Neutropenia She has had persistent and recurrent neutropenia despite dose reduction of the palbociclib and her blood counts have fluctuated up and down.  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 At the same time this will also evaluate for any new metastatic disease. I think she is strong enough and willing to pursue further treatment and we discussed a few options.   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  She now has recurrent disease.   Other specified disorders of bone density and structure, multiple sites Osteopenia, for which she has been receiving denosumab every 6 months, next due in September.  She continues calcium and vitamin-D as recommended.  She will be due for repeat bone density in September, which I will schedule.  We will hold her next dose of denosumab until after her bone density scan.  Lymphedema This is a new finding of her left upper extremity and 1+ at this time.  I gave her some literature about the diagnosis of lymphedema.  I find no adenopathy or tumor of the axilla.      Plan  We will stop the palbociclib after this cycle. I will schedule a PET scan to evaluate the liver as well as to assess for any metastasis. I will also get thoracic spine x-rays to evaluate her upper back pain. She may require an MRI of the thoracic spine. I will bring her back 1-2 weeks to review these results and make  decisions on further treatment, most likely exemestane and everolinmus. The patient understands the plans discussed today and is in agreement with them.  She knows to contact our office if she develops concerns prior to her next appointment.    I provided 30 minutes of face-to-face time during this encounter and > 50% was spent counseling as documented under my assessment  and plan.      Derwood Kaplan, MD  [facility] [address] [place] [address] Dept: [phone] Dept Fax: [phone]   No orders of the defined types were placed in this encounter.     CHIEF COMPLAINT:  CC: Recurrent hormone receptor positive breast cancer, as well as osteoporosis  Current Treatment: Palbociclib/anastrozole with denosumab every 6 months  HISTORY OF PRESENT ILLNESS:   Oncology History  Malignant neoplasm of upper-outer quadrant of left female breast ([facility])  03/07/2006 Initial Diagnosis   Breast cancer, left breast ([facility])   03/17/2006 Cancer Staging   Staging form: Breast, AJCC 6th Edition - Clinical stage from 03/17/2006: Stage IIIA (T1c, N2a, M0) - Signed by Derwood Kaplan, MD on 12/15/2020 Staged by: Managing physician Diagnostic confirmation: Positive histology Specimen type: Excision Histopathologic type: Infiltrating duct carcinoma, NOS Tumor size (mm): 15 Laterality: Left Total positive nodes: 5 Histologic grade (G): G1 Residual tumor (R): R0 - None Stage prefix: Initial diagnosis Lymphatic vessel invasion (L): L0 - No lymphatic vessel invasion Venous invasion (V): V0 - No venous invasion Prognostic indicators: ER/PR pos, HER 2 neg., Rx with AC x 3 mo, then Taxol x 3 mo.,AI x 10 yrs       INTERVAL HISTORY:  Carrie Wilkerson is here today for repeat clinical assessment of recurrent hormone receptor positive breast cancer, as well as osteoporosis. Patient states that she is experiencing pain of the upper back between her shoulders to the right, that radiates down her arm for the past 3 weeks. Her latest CT scan showed multiple spots in the liver and one tiny spot in the lung. I am suspicious it may be cancer. I recommended a PET scan. She might have thinning of the bone and we need to rule out a compression fracture which is causing her pain in her back. I advised her to finish the  bottle of Ibrance that she has now and then stop until we find out more information. I will schedule a PET scan and x-ray of the thoracic spine, we may need to pursue a MRI of that area but we'll wait to see what this other imaging reveals. I discussed with her different chemo options, such as exemestane and everolinmus, and reviewed the most common toxicities. At least she would not have the neutropenia but she may have mouth sores and other toxicities.  She will receive her x-rays today upstairs. Her bone density scan showed her hip is normal but her arm shows osteopenia. Patient has had lower back surgery years ago. Her white count has improved to 3.5 with a normal ANC but will lower as she finishes her pills. The rest of her chemistries are normal but she could use a little more fluids. She denies signs of infection such as sore throat, sinus drainage, cough, or urinary symptoms.  She denies fevers or recurrent chills. She denies pain. She denies nausea, vomiting, chest pain, dyspnea or cough. Her weight has been stable.   REVIEW OF SYSTEMS:  Review of Systems  Constitutional: Negative.  Negative for appetite change, chills, diaphoresis,  fatigue, fever and unexpected weight change.  HENT:  Negative.  Negative for hearing loss, lump/mass, mouth sores, nosebleeds, sore throat, tinnitus, trouble swallowing and voice change.   Eyes: Negative.  Negative for eye problems and icterus.  Respiratory: Negative.  Negative for chest tightness, cough, hemoptysis, shortness of breath and wheezing.   Cardiovascular: Negative.  Negative for chest pain, leg swelling and palpitations.  Gastrointestinal: Negative.  Negative for abdominal distention, abdominal pain, blood in stool, constipation, diarrhea, nausea, rectal pain and vomiting.  Endocrine: Negative.  Negative for hot flashes.  Genitourinary: Negative.  Negative for bladder incontinence, difficulty urinating, dyspareunia, dysuria, frequency, hematuria,  menstrual problem, nocturia, pelvic pain, vaginal bleeding and vaginal discharge.   Musculoskeletal:  Positive for back pain. Negative for arthralgias, flank pain, gait problem, myalgias, neck pain and neck stiffness.       Upper back between her shoulders to the right.  Skin: Negative.  Negative for itching, rash and wound.  Neurological: Negative.  Negative for dizziness, extremity weakness, gait problem, headaches, light-headedness, numbness, seizures and speech difficulty.  Hematological: Negative.  Negative for adenopathy. Does not bruise/bleed easily.  Psychiatric/Behavioral: Negative.  Negative for confusion, decreased concentration, depression, sleep disturbance and suicidal ideas. The patient is not nervous/anxious.      VITALS:  Blood pressure 130/66, pulse 77, temperature (!) 97.4 F (36.3 C), temperature source Oral, resp. rate 18, height '5\' 2"'$  (1.575 m), weight 159 lb 6.4 oz (72.3 kg), SpO2 95 %.  Wt Readings from Last 3 Encounters:  11/08/22 159 lb 4.8 oz (72.3 kg)  10/25/22 159 lb 6.4 oz (72.3 kg)  09/18/22 159 lb 14.4 oz (72.5 kg)    Body mass index is 29.15 kg/m.  Performance status (ECOG): 0 - Asymptomatic  PHYSICAL EXAM:  Physical Exam Constitutional:      General: She is not in acute distress.    Appearance: Normal appearance. She is normal weight. She is not ill-appearing, toxic-appearing or diaphoretic.  HENT:     Head: Normocephalic and atraumatic.     Right Ear: Tympanic membrane, ear canal and external ear normal. There is no impacted cerumen.     Left Ear: Tympanic membrane, ear canal and external ear normal. There is no impacted cerumen.     Nose: Nose normal. No congestion or rhinorrhea.     Mouth/Throat:     Mouth: Mucous membranes are moist.     Pharynx: Oropharynx is clear. No oropharyngeal exudate or posterior oropharyngeal erythema.  Eyes:     General: No scleral icterus.       Right eye: No discharge.        Left eye: No discharge.      Extraocular Movements: Extraocular movements intact.     Conjunctiva/sclera: Conjunctivae normal.     Pupils: Pupils are equal, round, and reactive to light.  Neck:     Vascular: No carotid bruit.  Cardiovascular:     Rate and Rhythm: Normal rate and regular rhythm.     Pulses: Normal pulses.     Heart sounds: Normal heart sounds. No murmur heard.    No friction rub. No gallop.  Pulmonary:     Effort: Pulmonary effort is normal. No respiratory distress.     Breath sounds: No stridor. Examination of the right-lower field reveals decreased breath sounds. Decreased breath sounds present. No wheezing, rhonchi or rales.  Chest:     Chest wall: No tenderness.     Comments: 1-2+ lymphedema of the left upper extremity. Well healed scar  at the lower outter qadrant on the left breast. Mild fibrocystic changes of the right breast. No masses in either breast.  Abdominal:     General: Bowel sounds are normal. There is no distension.     Palpations: Abdomen is soft. There is no hepatomegaly, splenomegaly or mass.     Tenderness: There is no abdominal tenderness. There is no right CVA tenderness, left CVA tenderness, guarding or rebound.     Hernia: No hernia is present.  Musculoskeletal:        General: No swelling, tenderness, deformity or signs of injury. Normal range of motion.     Cervical back: Normal range of motion and neck supple. No rigidity or tenderness.     Right lower leg: No edema.     Left lower leg: No edema.  Lymphadenopathy:     Cervical: No cervical adenopathy.     Right cervical: No superficial, deep or posterior cervical adenopathy.    Left cervical: No superficial, deep or posterior cervical adenopathy.     Upper Body:     Right upper body: No supraclavicular, axillary or pectoral adenopathy.     Left upper body: No supraclavicular, axillary or pectoral adenopathy.  Skin:    General: Skin is warm and dry.     Coloration: Skin is not jaundiced or pale.     Findings: No  bruising, erythema, lesion or rash.  Neurological:     General: No focal deficit present.     Mental Status: She is alert and oriented to person, place, and time. Mental status is at baseline.     Cranial Nerves: No cranial nerve deficit.     Sensory: No sensory deficit.     Motor: No weakness.     Coordination: Coordination normal.     Gait: Gait normal.     Deep Tendon Reflexes: Reflexes normal.  Psychiatric:        Mood and Affect: Mood normal.        Behavior: Behavior normal.        Thought Content: Thought content normal.        Judgment: Judgment normal.    LABS:      Latest Ref Rng & Units 10/23/2022   12:00 AM 10/09/2022    9:34 AM 09/18/2022   10:36 AM  CBC  WBC  3.5     3.9  2.6   Hemoglobin 12.0 - 16.0 13.0     13.0  12.2   Hematocrit 36 - 46 39     39.2  37.4   Platelets 150 - 400 K/uL 327     315  246      This result is from an external source.      Latest Ref Rng & Units 10/23/2022   12:00 AM 10/09/2022    9:34 AM 09/18/2022   10:36 AM  CMP  Glucose 70 - 99 mg/dL  89  80   BUN 4 - '21 21     14  16   '$ Creatinine 0.5 - 1.1 0.9     0.69  0.86   Sodium 137 - 147 140     141  142   Potassium 3.5 - 5.1 mEq/L 4.4     3.9  3.7   Chloride 99 - 108 103     107  108   CO2 13 - '22 26     27  28   '$ Calcium 8.7 - 10.7 9.8     9.1  9.1   Total Protein 6.5 - 8.1 g/dL  7.3  7.0   Total Bilirubin 0.3 - 1.2 mg/dL  0.6  0.6   Alkaline Phos 25 - 125 35     33  31   AST 13 - 35 '24     26  19   '$ ALT 7 - 35 U/L '13     20  12      '$ This result is from an external source.   Lab Results  Component Value Date   CEA1 1.6 10/11/2021   /  CEA  Date Value Ref Range Status  10/11/2021 1.6 0.0 - 4.7 ng/mL Final    Comment:    (NOTE)                             Nonsmokers          <3.9                             Smokers             <5.6 [employer] Electrochemiluminescence Immunoassay (ECLIA) Values obtained with different assay methods or kits cannot be used  interchangeably.  Results cannot be interpreted as absolute evidence of the presence or absence of malignant disease. Performed At: [facility] [place], [place] [id number] Rush Farmer MD [id number]    Component Ref Range & Units 09/07/2023  Cholesterol 0 - 200 mg/dL 200  Triglycerides <150 mg/dL 144  HDL >40 mg/dL 44  Total CHOL/HDL Ratio RATIO 4.5  VLDL 0 - 40 mg/dL 29  LDL Cholesterol 0 - 99 mg/dL 127 High      Component Ref Range & Units 1 d ago (10/24/22) 2 wk ago (10/09/22)  Hemoglobin 12.0 - 16.0 13.0 13.0 R  HCT 36 - 46 39 39.2 R  Neutrophils Absolute  1.82 1.9 R  Platelets 150 - 400 K/uL 327 315  WBC  3.      Component Ref Range & Units 1 d ago (10/24/22) 2 wk ago (10/09/22)  Glucose  87 89 R, CM  BUN 4 - '21 21 14 '$ R  CO2 13 - 22 26 Abnormal  27 R  Creatinine 0.5 - 1.1 0.9 0.69 R  Potassium 3.5 - 5.1 mEq/L 4.4 3.9 R  Sodium 137 - 147 140 141 R  Chloride 99 - 108 103    Component Ref Range & Units 1 d ago (10/24/22)  Calcium 8.7 - 10.7 9.8  Albumin 3.5 - 5.0 3.     No results found for: "PSA1" No results found for: "GYF749" No results found for: "CAN125"  No results found for: "TOTALPROTELP", "ALBUMINELP", "A1GS", "A2GS", "BETS", "BETA2SER", "GAMS", "MSPIKE", "SPEI" No results found for: "TIBC", "FERRITIN", "IRONPCTSAT" No results found for: "LDH"  STUDIES:  Exam: 10/23/2022 CT Chest, Abdomen, and Pelvis with Contrast Chest Impression New nodule in the right middle lobe is concerning for pulmonary metastasis.  Stable  RIGHT pleural effusion. No mediastinal adenopathy. Pelvis Impression Three new hypo-enhancing lesions in the liver consistent with hepatic metastasis.  No evidence of metastatic adenopathy in the abdomen pelvis.  No skeletal metastasis.          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  Report date is 11/29/21.   The [facility]+RNAinsight panel offered by [facility] includes sequencing and rearrangement analysis for the following 47 genes:  APC, ATM, AXIN2, BARD1, BMPR1A, BRCA1, BRCA2, BRIP1, CDH1, CDK4, CDKN2A, CHEK2, DICER1, EPCAM, GREM1, HOXB13,    History of left breast cancer 06/13/2021   Hypotension 06/13/2021   Incisional hernia, without obstruction or gangrene 10/22/2016   Malignant neoplasm of upper-outer quadrant of left female breast ([facility]) 03/07/2006   Mixed dyslipidemia 11/03/2018   Numbness in right leg    Hx: of   Obesity (BMI 30.0-34.9) 09/21/2020   Other specified disorders of bone density and structure, multiple sites 09/20/2020   Paroxysmal atrial fibrillation ([facility]) 02/13/2016   Personal history of COVID-19 09/2019   Pleural effusion, 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   Pneumonia    Hx: of 'a long time ago"   Postoperative examination 02/05/2016    Past Surgical History:  Procedure Laterality Date   APPENDECTOMY     BACK SURGERY     BREAST SURGERY     Hx: of lumpectomy left breast 2007   COLONOSCOPY     Hx: of   DILATION AND CURETTAGE OF UTERUS     TONSILLECTOMY     TOTAL HIP ARTHROPLASTY Right 04/13/2013   Dr Ninfa Linden   TOTAL HIP ARTHROPLASTY Right 04/13/2013   Procedure: RIGHT TOTAL HIP ARTHROPLASTY ANTERIOR APPROACH;  Surgeon: Mcarthur Rossetti, MD;  Location: [facility];  Service: Orthopedics;  Laterality: Right;   TUBAL LIGATION      Family History  Problem Relation Age of Onset   Hypertension Sister     Cancer Sister 87       uterine or ovarian   Bone cancer Sister 76   Breast cancer Sister        dx 64 and 48   Uterine cancer Sister 42   Colon cancer Sister 73   Breast cancer Sister 23   Hypertension Brother    Kidney cancer Brother 54   Cancer Brother 75       unknown type; mets   Liver cancer Brother    Liver cancer Brother 47   Pancreatic cancer Niece 56   Breast cancer Niece 11   Colon cancer Nephew 7       mets    Social History:  reports that she has never smoked. She has never used smokeless tobacco. She reports that she does not drink alcohol and does not use drugs.The patient is alone today.  Allergies:  Allergies  Allergen Reactions   Codeine     unknown    Sulfa Antibiotics     unknown    Chlorhexidine Rash    Current Medications: Current Outpatient Medications  Medication Sig Dispense Refill   anastrozole (ARIMIDEX) 1 MG tablet Take 1 tablet (1 mg total) by mouth daily. 90 tablet 3   apixaban (ELIQUIS) 5 MG TABS tablet Take 1 tablet (5 mg total) by mouth 2 (two) times daily. 180 tablet 3   atorvastatin (LIPITOR) 10 MG tablet Take 1 tablet by mouth every other day.  4   CALCIUM PO  Take 1 tablet by mouth daily.     cholecalciferol (VITAMIN D) 1000 UNITS tablet Take 1,000 Units by mouth daily.     denosumab (PROLIA) 60 MG/ML SOLN injection Inject 60 mg into the skin every 6 (six) months. Administer in upper arm, thigh, or abdomen     Flaxseed, Linseed, OIL Take 1 capsule by mouth daily.     glucosamine-chondroitin 500-400 MG tablet Take 1 tablet by mouth 2 (two) times daily.      levothyroxine (SYNTHROID, LEVOTHROID) 75 MCG tablet Take 75 mcg by mouth daily.   7   metoprolol tartrate (LOPRESSOR) 25 MG tablet Take 1 tablet (25 mg total) by mouth 2 (two) times daily. 180 tablet 3   Multiple Vitamin (MULTIVITAMIN WITH MINERALS) TABS Take 1 tablet by mouth daily.     ondansetron (ZOFRAN-ODT) 4 MG disintegrating tablet Take 4 mg by mouth 2 (two) times daily  as needed for nausea/vomiting.     No current facility-administered medications for this visit.      I,Jasmine M Lassiter,acting as a scribe for Derwood Kaplan, MD.,have documented all relevant documentation on the behalf of Derwood Kaplan, MD,as directed by  Derwood Kaplan, MD while in the presence of Derwood Kaplan, MD.

## 2022-10-24 ENCOUNTER — Encounter: Payer: Self-pay | Admitting: Oncology

## 2022-10-25 ENCOUNTER — Encounter: Payer: Self-pay | Admitting: Oncology

## 2022-10-25 ENCOUNTER — Other Ambulatory Visit: Payer: Self-pay | Admitting: Oncology

## 2022-10-25 ENCOUNTER — Telehealth: Payer: Self-pay | Admitting: Oncology

## 2022-10-25 ENCOUNTER — Inpatient Hospital Stay: Payer: Medicare HMO | Admitting: Oncology

## 2022-10-25 VITALS — BP 130/66 | HR 77 | Temp 97.4°F | Resp 18 | Ht 62.0 in | Wt 159.4 lb

## 2022-10-25 DIAGNOSIS — D702 Other drug-induced agranulocytosis: Secondary | ICD-10-CM | POA: Diagnosis not present

## 2022-10-25 DIAGNOSIS — K769 Liver disease, unspecified: Secondary | ICD-10-CM

## 2022-10-25 DIAGNOSIS — C50412 Malignant neoplasm of upper-outer quadrant of left female breast: Secondary | ICD-10-CM | POA: Diagnosis not present

## 2022-10-25 DIAGNOSIS — J910 Malignant pleural effusion: Secondary | ICD-10-CM | POA: Diagnosis not present

## 2022-10-25 DIAGNOSIS — Z170 Estrogen receptor positive status [ER+]: Secondary | ICD-10-CM | POA: Diagnosis not present

## 2022-10-25 DIAGNOSIS — M546 Pain in thoracic spine: Secondary | ICD-10-CM | POA: Diagnosis not present

## 2022-10-25 NOTE — Telephone Encounter (Signed)
Patient has been scheduled for follow-up visit per 10/25/22 LOS.  Pt given an appt calendar with date and time.

## 2022-10-31 DIAGNOSIS — Z8249 Family history of ischemic heart disease and other diseases of the circulatory system: Secondary | ICD-10-CM | POA: Diagnosis not present

## 2022-10-31 DIAGNOSIS — Z8673 Personal history of transient ischemic attack (TIA), and cerebral infarction without residual deficits: Secondary | ICD-10-CM | POA: Diagnosis not present

## 2022-10-31 DIAGNOSIS — Z008 Encounter for other general examination: Secondary | ICD-10-CM | POA: Diagnosis not present

## 2022-10-31 DIAGNOSIS — C50919 Malignant neoplasm of unspecified site of unspecified female breast: Secondary | ICD-10-CM | POA: Diagnosis not present

## 2022-10-31 DIAGNOSIS — J302 Other seasonal allergic rhinitis: Secondary | ICD-10-CM | POA: Diagnosis not present

## 2022-10-31 DIAGNOSIS — Z7901 Long term (current) use of anticoagulants: Secondary | ICD-10-CM | POA: Diagnosis not present

## 2022-10-31 DIAGNOSIS — I4891 Unspecified atrial fibrillation: Secondary | ICD-10-CM | POA: Diagnosis not present

## 2022-10-31 DIAGNOSIS — R32 Unspecified urinary incontinence: Secondary | ICD-10-CM | POA: Diagnosis not present

## 2022-10-31 DIAGNOSIS — E785 Hyperlipidemia, unspecified: Secondary | ICD-10-CM | POA: Diagnosis not present

## 2022-10-31 DIAGNOSIS — I700 Atherosclerosis of aorta: Secondary | ICD-10-CM | POA: Diagnosis not present

## 2022-10-31 DIAGNOSIS — E039 Hypothyroidism, unspecified: Secondary | ICD-10-CM | POA: Diagnosis not present

## 2022-10-31 DIAGNOSIS — M810 Age-related osteoporosis without current pathological fracture: Secondary | ICD-10-CM | POA: Diagnosis not present

## 2022-10-31 DIAGNOSIS — Z803 Family history of malignant neoplasm of breast: Secondary | ICD-10-CM | POA: Diagnosis not present

## 2022-11-04 ENCOUNTER — Ambulatory Visit: Payer: Medicare HMO | Admitting: Oncology

## 2022-11-06 NOTE — Progress Notes (Shared)
[facility]  [address] 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 CT chest in December revealed right middle and lower lobe collapse with large right pleural effusion. There was no central obstructing mass lesion evident. No obvious right-sided pleural disease although there is a irregular focus of soft tissue attenuation along the medial right upper lobe pleura/right anterior mediastinum.  There were scattered tiny bilateral pulmonary nodules measuring up to 4 mm.  PET was negative.  She required thoracentesis on 2 occasions, the last in January.  Cytology revealed malignant cells consistent with breast primary, ER/PR positive.  She was started on anastrozole 1 mg daily on January 5.  Oral chemotherapy with palbociclib 125 mg daily, for 3 weeks on and 1 week off, was added in February.  After 1 cycle, she developed severe neutropenia. Her 2nd cycle was postponed 1 week.  Prior to a 3rd cycle, she had recurrent neutropenia, so her chemotherapy was held for 2 weeks and the palbociclib dose reduced to 100 mg daily.  She had persistent neutropenia and the dose was decreased to '75mg'$  daily.  CT chest, abdomen and pelvis on July 24 revealed a decrease in the large right pleural effusion and compressive right lung atelectasis. Now her scan in January, 2024 reveals a new nodule in  the right middle lobe measuring 31m. But more concerning are the new lesions in the liver. She still has a large right pleural effusion that remains stable.  Right Lung Mass She has a hypermetabolic right infrahilar mass measuring 2.4cm with an SUV 16.87. This could represent recurrent breast cancer but I cannot rule  out a new lung primary. She has never smoked and that is unlikely. However, if it is a lung primary in a non-smoker, she may be more likely to have a targetable mutation.  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 At the same time this will also evaluate for any new metastatic disease. I think she is strong enough and willing to pursue further treatment and we discussed a few options.   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  She now has recurrent disease.   Malignant Neoplasm Metastatic to Bone On PET scans she has multiple bone lesions including the right 11th rib, left iliac bone, and left sacrum. She also has metastases noted in muscle tissue, bilateral paraspinal musculature. She will need monthly bone strengthening medications such as Xgeva or Zometa.   Other specified disorders of bone density and structure, multiple sites Osteopenia, for which she has been receiving denosumab every 6 months, next due in September.  She continues calcium and vitamin-D as recommended.  She had a repeat bone density in December, which is stable.  Her last dose was given in September. She will now likely change to monthly due to her bone metastases.    Lymphedema This is a new  finding of her left upper extremity and 1+ at this time.  I gave her some literature about the diagnosis of lymphedema.  I find no adenopathy or tumor of the axilla.     Plan  She is now off Ibrance and is currently on Anastrozole.  She will need to have a CT guided biopsy of the liver hopefully next week and she will get a  liquid Guardant 360 done today.  These tests are especially important in view of the fact that she now has a hilar mass of the lung which could represent a new primary.  We will also plan Guardant testing of the biopsy if we are unable to get an adequate answer with the liquid sample. She will return a few day later so we can form a treatment plan together. I will see her back in 2 weeks with CBC, CMP, CEA, and CA 27.21. The patient understands the plans discussed today and is in agreement with them.  She knows to contact our office if she develops concerns prior to her next appointment.    I provided 35 minutes of face-to-face time during this encounter and > 50% was spent counseling as documented under my assessment and plan.      Derwood Kaplan, MD  [facility] [address] [place] [address] Dept: [phone] Dept Fax: [phone]   No orders of the defined types were placed in this encounter.    CHIEF COMPLAINT:  CC: Recurrent hormone receptor positive breast cancer, as well as osteoporosis  Current Treatment: Palbociclib/anastrozole with denosumab every 6 months  HISTORY OF PRESENT ILLNESS:   Oncology History  Malignant neoplasm of upper-outer quadrant of left female breast ([facility])  03/07/2006 Initial Diagnosis   Breast cancer, left breast ([facility])   03/17/2006 Cancer Staging   Staging form: Breast, AJCC 6th Edition - Clinical stage from 03/17/2006: Stage IIIA (T1c, N2a, M0) - Signed by Derwood Kaplan, MD on 12/15/2020 Staged by: Managing physician Diagnostic confirmation: Positive histology Specimen type: Excision Histopathologic type: Infiltrating duct carcinoma, NOS Tumor size (mm): 15 Laterality: Left Total positive nodes: 5 Histologic grade (G): G1 Residual tumor (R): R0 - None Stage prefix: Initial diagnosis Lymphatic vessel invasion (L): L0 - No lymphatic vessel invasion Venous invasion (V):  V0 - No venous invasion Prognostic indicators: ER/PR pos, HER 2 neg., Rx with AC x 3 mo, then Taxol x 3 mo.,AI x 10 yrs       INTERVAL HISTORY:  Carrie Wilkerson is here today for repeat clinical assessment of recurrent hormone receptor positive breast cancer, as well as osteoporosis. Patient states that she still has some pain of the back and right flank, but it is not as bad as her last visit. Her PET scan shows that the cancer has spread and I believe we need to have a biopsy performed. Her scan showed  widespread metastatic disease. Findings could be due to metastatic breast cancer or lung cancer. There is a sizable right lower lobe/right infrahilar mass which is more suggestive of a recurrent lung cancer with associated mediastinal and hilar adenopathy. Metastatic pulmonary and pleural nodules, metastatic hepatic and osseous lesions and muscle metastasis and a large right pleural effusion that is likely malignant. She is now off Ibrance and is currently on Anastrozole. Patient will need to have a CT guided biopsy of the liver hopefully next week and she will get a liquid Guardant 360 done today. She will return a  few day later so we can form a treatment plan together. I will see her back in 2 weeks with CBC, CMP, CEA, and CA 27.21.   She denies signs of infection such as sore throat, sinus drainage, cough, or urinary symptoms.  She denies fevers or recurrent chills. She denies pain. She denies nausea, vomiting, chest pain, dyspnea or cough. Her weight has been stable.   REVIEW OF SYSTEMS:  Review of Systems  Constitutional: Negative.  Negative for appetite change, chills, diaphoresis, fatigue, fever and unexpected weight change.  HENT:  Negative.  Negative for hearing loss, lump/mass, mouth sores, nosebleeds, sore throat, tinnitus, trouble swallowing and voice change.   Eyes: Negative.  Negative for eye problems and icterus.  Respiratory: Negative.  Negative for chest tightness, cough, hemoptysis,  shortness of breath and wheezing.   Cardiovascular: Negative.  Negative for chest pain, leg swelling and palpitations.  Gastrointestinal: Negative.  Negative for abdominal distention, abdominal pain, blood in stool, constipation, diarrhea, nausea, rectal pain and vomiting.  Endocrine: Negative.  Negative for hot flashes.  Genitourinary:  Negative for bladder incontinence, difficulty urinating, dyspareunia, dysuria, frequency, hematuria, menstrual problem, nocturia, pelvic pain, vaginal bleeding and vaginal discharge.   Musculoskeletal:  Positive for back pain and flank pain. Negative for arthralgias, gait problem, myalgias, neck pain and neck stiffness.  Skin: Negative.  Negative for itching, rash and wound.  Neurological: Negative.  Negative for dizziness, extremity weakness, gait problem, headaches, light-headedness, numbness, seizures and speech difficulty.  Hematological: Negative.  Negative for adenopathy. Does not bruise/bleed easily.  Psychiatric/Behavioral: Negative.  Negative for confusion, decreased concentration, depression, sleep disturbance and suicidal ideas. The patient is not nervous/anxious.   All other systems reviewed and are negative.    VITALS:  Blood pressure 122/65, pulse 85, temperature 97.8 F (36.6 C), temperature source Oral, resp. rate 15, height '5\' 2"'$  (1.575 m), weight 159 lb 4.8 oz (72.3 kg), SpO2 97 %.  Wt Readings from Last 3 Encounters:  11/08/22 159 lb 4.8 oz (72.3 kg)  10/25/22 159 lb 6.4 oz (72.3 kg)  09/18/22 159 lb 14.4 oz (72.5 kg)    Body mass index is 29.14 kg/m.  Performance status (ECOG): 0 - Asymptomatic  PHYSICAL EXAM:  Physical Exam Vitals and nursing note reviewed.  Constitutional:      General: She is not in acute distress.    Appearance: Normal appearance. She is normal weight. She is not ill-appearing, toxic-appearing or diaphoretic.  HENT:     Head: Normocephalic and atraumatic.     Right Ear: Tympanic membrane, ear canal and  external ear normal. There is no impacted cerumen.     Left Ear: Tympanic membrane, ear canal and external ear normal. There is no impacted cerumen.     Nose: Nose normal. No congestion or rhinorrhea.     Mouth/Throat:     Mouth: Mucous membranes are moist.     Pharynx: Oropharynx is clear. No oropharyngeal exudate or posterior oropharyngeal erythema.  Eyes:     General: No scleral icterus.       Right eye: No discharge.        Left eye: No discharge.     Extraocular Movements: Extraocular movements intact.     Conjunctiva/sclera: Conjunctivae normal.     Pupils: Pupils are equal, round, and reactive to light.  Neck:     Vascular: No carotid bruit.  Cardiovascular:     Rate and Rhythm: Normal rate and regular rhythm.     Pulses: Normal pulses.  Heart sounds: Normal heart sounds. No murmur heard.    No friction rub. No gallop.  Pulmonary:     Effort: Pulmonary effort is normal. No respiratory distress.     Breath sounds: No stridor. Examination of the right-lower field reveals decreased breath sounds. Decreased breath sounds present. No wheezing, rhonchi or rales.  Chest:     Chest wall: No tenderness.     Comments: 1-2+ lymphedema of the left upper extremity. Well healed scar at the lower outter qadrant on the left breast. Mild fibrocystic changes of the right breast. No masses in either breast.  Abdominal:     General: Bowel sounds are normal. There is no distension.     Palpations: Abdomen is soft. There is hepatomegaly (mild). There is no splenomegaly or mass.     Tenderness: There is no abdominal tenderness. There is no right CVA tenderness, left CVA tenderness, guarding or rebound.     Hernia: No hernia is present.  Musculoskeletal:        General: No swelling, tenderness, deformity or signs of injury. Normal range of motion.     Cervical back: Normal range of motion and neck supple. No rigidity or tenderness.     Right lower leg: No edema.     Left lower leg: No edema.   Lymphadenopathy:     Cervical: No cervical adenopathy.     Right cervical: No superficial, deep or posterior cervical adenopathy.    Left cervical: No superficial, deep or posterior cervical adenopathy.     Upper Body:     Right upper body: No supraclavicular, axillary or pectoral adenopathy.     Left upper body: No supraclavicular, axillary or pectoral adenopathy.  Skin:    General: Skin is warm and dry.     Coloration: Skin is not jaundiced or pale.     Findings: No bruising, erythema, lesion or rash.  Neurological:     General: No focal deficit present.     Mental Status: She is alert and oriented to person, place, and time. Mental status is at baseline.     Cranial Nerves: No cranial nerve deficit.     Sensory: No sensory deficit.     Motor: No weakness.     Coordination: Coordination normal.     Gait: Gait normal.     Deep Tendon Reflexes: Reflexes normal.  Psychiatric:        Mood and Affect: Mood normal.        Behavior: Behavior normal.        Thought Content: Thought content normal.        Judgment: Judgment normal.    LABS:      Latest Ref Rng & Units 10/23/2022   12:00 AM 10/09/2022    9:34 AM 09/18/2022   10:36 AM  CBC  WBC  3.5     3.9  2.6   Hemoglobin 12.0 - 16.0 13.0     13.0  12.2   Hematocrit 36 - 46 39     39.2  37.4   Platelets 150 - 400 K/uL 327     315  246      This result is from an external source.      Latest Ref Rng & Units 10/23/2022   12:00 AM 10/09/2022    9:34 AM 09/18/2022   10:36 AM  CMP  Glucose 70 - 99 mg/dL  89  80   BUN 4 - '21 21     14  '$ 16  Creatinine 0.5 - 1.1 0.9     0.69  0.86   Sodium 137 - 147 140     141  142   Potassium 3.5 - 5.1 mEq/L 4.4     3.9  3.7   Chloride 99 - 108 103     107  108   CO2 13 - '22 26     27  28   '$ Calcium 8.7 - 10.7 9.8     9.1  9.1   Total Protein 6.5 - 8.1 g/dL  7.3  7.0   Total Bilirubin 0.3 - 1.2 mg/dL  0.6  0.6   Alkaline Phos 25 - 125 35     33  31   AST 13 - 35 '24     26  19   '$ ALT 7 - 35  U/L '13     20  12      '$ This result is from an external source.   Lab Results  Component Value Date   CEA1 1.6 10/11/2021   /  CEA  Date Value Ref Range Status  10/11/2021 1.6 0.0 - 4.7 ng/mL Final    Comment:    (NOTE)                             Nonsmokers          <3.9                             Smokers             <5.6 Roche [employer] Electrochemiluminescence Immunoassay (ECLIA) Values obtained with different assay methods or kits cannot be used interchangeably.  Results cannot be interpreted as absolute evidence of the presence or absence of malignant disease. Performed At: [facility] [place], [place] [id number] Rush Farmer MD YK:[id number]    No results found for: "PSA1" No results found for: "[id number]" No results found for: "CAN125"  No results found for: "TOTALPROTELP", "ALBUMINELP", "A1GS", "A2GS", "BETS", "BETA2SER", "GAMS", "MSPIKE", "SPEI" No results found for: "TIBC", "FERRITIN", "IRONPCTSAT" No results found for: "LDH"  STUDIES:  No results found.  Exam: 11/07/2022 Nuclear Medicine PET skull Base to Thigh Impression: Widespread metastatic disease. Findings could be due to metastatic breast cancer or lung cancer. There is a sizable right lower lobe/right infrahilar mass which is more suggestive of a recurrent lung cancer with associated mediastinal and hilar adenopathy. Metastatic pulmonary and pleural nodules, metastatic hepatic and osseous lesions and muscle metastasis. Large right pleural effusion is likely malignant.  Other incidental findings as detailed.              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  Report date is 11/29/21.   The [facility]RNAinsight panel offered by [facility] includes sequencing and rearrangement analysis for the following  47 genes:  APC, ATM, AXIN2, BARD1, BMPR1A, BRCA1, BRCA2, BRIP1, CDH1, CDK4, CDKN2A, CHEK2, DICER1, EPCAM, GREM1, HOXB13,    History of left breast cancer 06/13/2021   Hypotension 06/13/2021   Incisional hernia, without obstruction or gangrene 10/22/2016   Malignant neoplasm of upper-outer quadrant of left female breast ([place]) 03/07/2006   Mixed dyslipidemia 11/03/2018   Numbness in right leg    Hx: of   Obesity (BMI 30.0-34.9) 09/21/2020   Other specified disorders of bone density and structure, multiple sites 09/20/2020   Paroxysmal atrial fibrillation ([place]) 02/13/2016   Personal history of COVID-19 09/2019   Pleural effusion, 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   Pneumonia    Hx: of 'a long time ago"   Postoperative examination 02/05/2016    Past Surgical History:  Procedure Laterality Date   APPENDECTOMY     BACK SURGERY     BREAST SURGERY     Hx: of lumpectomy left breast 2007   COLONOSCOPY     Hx: of   DILATION AND CURETTAGE OF UTERUS     TONSILLECTOMY     TOTAL HIP ARTHROPLASTY Right 04/13/2013   Dr Ninfa Linden   TOTAL HIP ARTHROPLASTY Right 04/13/2013   Procedure: RIGHT TOTAL HIP ARTHROPLASTY ANTERIOR APPROACH;  Surgeon: Mcarthur Rossetti, MD;  Location: [facility];  Service: Orthopedics;  Laterality: Right;   TUBAL LIGATION      Family History  Problem Relation Age of Onset   Hypertension Sister    Cancer Sister 1       uterine or ovarian   Bone cancer Sister 58   Breast cancer Sister        dx 11 and 64   Uterine cancer Sister 25   Colon cancer Sister 63   Breast cancer  Sister 95   Hypertension Brother    Kidney cancer Brother 2   Cancer Brother 10       unknown type; mets   Liver cancer Brother    Liver cancer Brother 46   Pancreatic cancer Niece 89   Breast cancer Niece 86   Colon cancer Nephew 29       mets    Social History:  reports that she has never smoked. She has never used smokeless tobacco. She reports that she does not drink alcohol and does not use drugs.The patient is alone today.  Allergies:  Allergies  Allergen Reactions   Codeine     unknown    Sulfa Antibiotics     unknown    Chlorhexidine Rash    Current Medications: Current Outpatient Medications  Medication Sig Dispense Refill   anastrozole (ARIMIDEX) 1 MG tablet Take 1 tablet (1 mg total) by mouth daily. 90 tablet 3   apixaban (ELIQUIS) 5 MG TABS tablet Take 1 tablet (5 mg total) by mouth 2 (two) times daily. 180 tablet 3   atorvastatin (LIPITOR) 10 MG tablet Take 1 tablet by mouth every other day.  4   CALCIUM PO Take 1 tablet by mouth daily.     cholecalciferol (VITAMIN D) 1000 UNITS tablet Take 1,000 Units by mouth daily.     denosumab (PROLIA) 60 MG/ML SOLN injection Inject 60 mg into the skin every 6 (six) months. Administer in upper arm, thigh, or abdomen     Flaxseed, Linseed, OIL Take 1 capsule by mouth daily.     glucosamine-chondroitin 500-400 MG tablet Take 1 tablet by mouth 2 (two) times daily.      levothyroxine (SYNTHROID, LEVOTHROID) 75 MCG tablet Take 75 mcg  by mouth daily.   7   metoprolol tartrate (LOPRESSOR) 25 MG tablet Take 1 tablet (25 mg total) by mouth 2 (two) times daily. 180 tablet 3   Multiple Vitamin (MULTIVITAMIN WITH MINERALS) TABS Take 1 tablet by mouth daily.     ondansetron (ZOFRAN-ODT) 4 MG disintegrating tablet Take 4 mg by mouth 2 (two) times daily as needed for nausea/vomiting.     No current facility-administered medications for this visit.      I,Jasmine M Lassiter,acting as a scribe for Derwood Kaplan, MD.,have  documented all relevant documentation on the behalf of Derwood Kaplan, MD,as directed by  Derwood Kaplan, MD while in the presence of Derwood Kaplan, MD.

## 2022-11-07 DIAGNOSIS — C7800 Secondary malignant neoplasm of unspecified lung: Secondary | ICD-10-CM | POA: Diagnosis not present

## 2022-11-07 DIAGNOSIS — R590 Localized enlarged lymph nodes: Secondary | ICD-10-CM | POA: Diagnosis not present

## 2022-11-07 DIAGNOSIS — C787 Secondary malignant neoplasm of liver and intrahepatic bile duct: Secondary | ICD-10-CM | POA: Diagnosis not present

## 2022-11-07 DIAGNOSIS — C7951 Secondary malignant neoplasm of bone: Secondary | ICD-10-CM | POA: Diagnosis not present

## 2022-11-07 DIAGNOSIS — J90 Pleural effusion, not elsewhere classified: Secondary | ICD-10-CM | POA: Diagnosis not present

## 2022-11-07 DIAGNOSIS — C50919 Malignant neoplasm of unspecified site of unspecified female breast: Secondary | ICD-10-CM | POA: Diagnosis not present

## 2022-11-07 DIAGNOSIS — K769 Liver disease, unspecified: Secondary | ICD-10-CM | POA: Diagnosis not present

## 2022-11-07 DIAGNOSIS — R918 Other nonspecific abnormal finding of lung field: Secondary | ICD-10-CM | POA: Diagnosis not present

## 2022-11-07 DIAGNOSIS — R7309 Other abnormal glucose: Secondary | ICD-10-CM | POA: Diagnosis not present

## 2022-11-08 ENCOUNTER — Inpatient Hospital Stay: Payer: Medicare HMO | Attending: Oncology | Admitting: Oncology

## 2022-11-08 ENCOUNTER — Other Ambulatory Visit: Payer: Self-pay | Admitting: Oncology

## 2022-11-08 ENCOUNTER — Encounter: Payer: Self-pay | Admitting: Oncology

## 2022-11-08 VITALS — BP 122/65 | HR 85 | Temp 97.8°F | Resp 15 | Ht 62.0 in | Wt 159.3 lb

## 2022-11-08 DIAGNOSIS — Z170 Estrogen receptor positive status [ER+]: Secondary | ICD-10-CM

## 2022-11-08 DIAGNOSIS — C787 Secondary malignant neoplasm of liver and intrahepatic bile duct: Secondary | ICD-10-CM | POA: Diagnosis not present

## 2022-11-08 DIAGNOSIS — C7951 Secondary malignant neoplasm of bone: Secondary | ICD-10-CM | POA: Insufficient documentation

## 2022-11-08 DIAGNOSIS — C50412 Malignant neoplasm of upper-outer quadrant of left female breast: Secondary | ICD-10-CM | POA: Diagnosis not present

## 2022-11-08 HISTORY — DX: Secondary malignant neoplasm of liver and intrahepatic bile duct: C78.7

## 2022-11-08 HISTORY — DX: Secondary malignant neoplasm of bone: C79.51

## 2022-11-11 ENCOUNTER — Telehealth: Payer: Self-pay | Admitting: Oncology

## 2022-11-11 NOTE — Telephone Encounter (Signed)
Notified pt of upcoming CT Liver Biopsy appt date, time and instructions.

## 2022-11-13 ENCOUNTER — Encounter: Payer: Self-pay | Admitting: Oncology

## 2022-11-18 DIAGNOSIS — C787 Secondary malignant neoplasm of liver and intrahepatic bile duct: Secondary | ICD-10-CM | POA: Diagnosis not present

## 2022-11-18 DIAGNOSIS — K7689 Other specified diseases of liver: Secondary | ICD-10-CM | POA: Diagnosis not present

## 2022-11-18 DIAGNOSIS — C50412 Malignant neoplasm of upper-outer quadrant of left female breast: Secondary | ICD-10-CM | POA: Diagnosis not present

## 2022-11-18 DIAGNOSIS — Z7901 Long term (current) use of anticoagulants: Secondary | ICD-10-CM | POA: Diagnosis not present

## 2022-11-18 LAB — POCT INR: INR: 1 (ref 0.80–1.20)

## 2022-11-18 LAB — CBC AND DIFFERENTIAL
HCT: 41 (ref 36–46)
Hemoglobin: 13.9 (ref 12.0–16.0)
Neutrophils Absolute: 2.54
Platelets: 318 10*3/uL (ref 150–400)
WBC: 4.8

## 2022-11-18 LAB — HEPATIC FUNCTION PANEL
ALT: 18 U/L (ref 7–35)
AST: 31 (ref 13–35)
Alkaline Phosphatase: 44 (ref 25–125)
Bilirubin, Total: 0.6

## 2022-11-18 LAB — PROTIME-INR: Protime: 10.3 (ref 10.0–13.8)

## 2022-11-18 LAB — COMPREHENSIVE METABOLIC PANEL
Albumin: 3.6 (ref 3.5–5.0)
Calcium: 9.2 (ref 8.7–10.7)

## 2022-11-18 LAB — BASIC METABOLIC PANEL
BUN: 16 (ref 4–21)
CO2: 26 — AB (ref 13–22)
Chloride: 109 — AB (ref 99–108)
Creatinine: 0.7 (ref 0.5–1.1)
Glucose: 111
Potassium: 4.2 mEq/L (ref 3.5–5.1)
Sodium: 140 (ref 137–147)

## 2022-11-18 LAB — CBC: RBC: 4.14 (ref 3.87–5.11)

## 2022-11-21 ENCOUNTER — Encounter: Payer: Self-pay | Admitting: Oncology

## 2022-11-22 NOTE — Progress Notes (Signed)
[facility]  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 CT chest in December revealed right middle and lower lobe collapse with large right pleural effusion. PET was negative.  She required thoracentesis on 2 occasions, the last in January.  Cytology revealed malignant cells consistent with breast primary, ER/PR positive.  She was started on anastrozole 1 mg daily on January 5.  Oral chemotherapy with palbociclib 125 mg was added in February.  After 1 cycle, she developed severe neutropenia. Her 2nd cycle was postponed 1 week.  Prior to a 3rd cycle, she had recurrent neutropenia, so her chemotherapy was held for 2 weeks and the palbociclib dose reduced to 100 mg daily.  She had persistent neutropenia and the dose was decreased to '75mg'$  daily.  CT chest, abdomen and pelvis on July 24 revealed a decrease in the large right pleural effusion and compressive right lung atelectasis. Now her scan in January, 2024 reveals a new nodule in  the right middle lobe measuring 61m. But more concerning are the new lesions in the liver. She still has a large right pleural effusion that remains stable.  Right Lung Mass She has a hypermetabolic right infrahilar mass measuring 2.4cm with an SUV 16.87. This could represent recurrent breast cancer but I cannot rule out a new lung primary. She has never smoked and that is unlikely. However, if it is a lung primary in a non-smoker, she may be more likely to have a targetable mutation.  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 At the same time this will also evaluate for any new metastatic disease. I think she is strong enough and willing to pursue further treatment and we discussed a few options.  We therefore obtained a liver biopsy to confirm the diagnosis and this is positive for metastatic breast cancer.   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  She now has recurrent disease.   Malignant Neoplasm Metastatic to Bone On PET scans she has multiple bone lesions including the right 11th rib, left iliac bone, and left sacrum. She also has metastases noted in muscle tissue, bilateral paraspinal musculature. She will need monthly bone strengthening medications such as Xgeva or Zometa.   Other specified disorders of bone density and structure, multiple sites Osteopenia, for which she has been receiving denosumab every 6 months, next due in September.  She continues calcium and vitamin-D as recommended.  She had a repeat bone density in December, 2023, which is stable.  Her last dose was given in September. She will now change to monthly injections due to her bone metastases.    Lymphedema This is a new finding of her left upper extremity and 1+ at this time.  I gave her some literature about the diagnosis of lymphedema.  I find no adenopathy or tumor of the  axilla.     Plan  Her last CT scan showed 3 lesions of her liver. Her recent CT scan showed a tumor in her lung and is acting aggressively. A PET scan was done 11/07/2022 which confirmed multiple liver lesions as well as multiple osseous lesions and even metastatic deposits in her muscle. It also showed a hypermetabolic right infrahilar mass measuring 2.4cm  with SUV max of 16.87 with small hypermetabolic pleural nodules and hypermetabolic mediastinal adenopathy. She still has a large right pleural effusion which is stable. 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 She has an appointment this Wednesday with Verline Lema, the oncology pharmacist, to go into depth and educate her on this oral chemotherapy drug. I will order her Zofran and Compazine to help with nausea, a common side effect from being on Elacestrant. She will stop Prolia and start Xgeva, she will receive it once a month and take Elacestrant oral daily. We had her stop her hormonal therapy.  I will see her back in 2 weeks with CBC and CMP. Of note, her cancer markers in the blood were normal. The patient understands the plans discussed today and is in agreement with them.  I reviewed all of this information with her and her daughter today and answered their questions.  She knows to contact our office if she develops concerns prior to her next appointment.  I provided 25 minutes of face-to-face time during this encounter and > 50% was spent counseling as documented under my assessment and plan.      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, bone and lung/pleura  Current Treatment: Elacestrant oral chemotherapy with monthly Xgeva  HISTORY OF PRESENT ILLNESS:   Oncology History  Malignant neoplasm of upper-outer  quadrant of left female breast ([facility])  03/07/2006 Initial Diagnosis   Breast cancer, left breast ([facility])   03/17/2006 Cancer Staging   Staging form: Breast, AJCC 6th Edition - Clinical stage from 03/17/2006: Stage IIIA (T1c, N2a, M0) - Signed by Derwood Kaplan, MD on 12/15/2020 Staged by: Managing physician Diagnostic confirmation: Positive histology Specimen type: Excision Histopathologic type: Infiltrating duct carcinoma, NOS Tumor size (mm): 15 Laterality: Left Total positive nodes: 5 Histologic grade (G): G1 Residual tumor (R): R0 - None Stage prefix: Initial diagnosis Lymphatic vessel invasion (L): L0 - No lymphatic vessel invasion Venous invasion (V): V0 - No venous invasion Prognostic indicators: ER/PR pos, HER 2 neg., Rx with AC x 3 mo, then Taxol x 3 mo.,AI x 10 yrs       INTERVAL HISTORY:  Annasophia is here today for repeat clinical assessment of recurrent hormone receptor positive breast cancer. Patient states that she feels ok and complains of pain in her lower to mid back area.  She has been on hormonal therapy and taken Ibrance for the last year with  some stability of her disease. Her latest CT scan showed 3 lesions of her liver, and a tumor in her lung and is acting aggressively. A PET scan was done 11/07/2022 which confirmed multiple liver lesions as well as multiple osseous lesions and even metastatic deposits in her muscle. It also showed a hypermetabolic right infrahilar mass measuring 2.4cm with SUV max of 16.87 with small hypermetabolic pleural nodules and hypermetabolic mediastinal adenopathy. She still has a large right pleural effusion which is stable. 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 I explained to her about Elacestrant oral chemotherapy, the risks and benefits, and how it is targeted again her mutation. She has an appointment this Wednesday with Verline Lema, the oncology pharmacist, to go into depth and educate her on this oral chemotherapy  drug. I will order her Zofran and Compazine to help with nausea, a common side effect from Elacestrant. I reassured her that her Prolia injection also helps with cancer in the bone but we will stop it and change to [facility] which reacts the same way however, she will receive it once a month and take Elacestrant daily.  I will see her back in 2 weeks with CBC and CMP. Of note, her cancer markers in the blood were normal. She denies signs of infection such as sore throat, sinus drainage, cough, or urinary symptoms.  She denies fevers or recurrent chills. She denies pain. She denies nausea, vomiting, chest pain, dyspnea or cough. Her weight has been stable. Patient is accompanied at today's visit with her daughter Magda Paganini.   REVIEW OF SYSTEMS:  Review of Systems  Constitutional: Negative.  Negative for appetite change, chills, diaphoresis, fatigue, fever and unexpected weight change.  HENT:  Negative.  Negative for hearing loss, lump/mass, mouth sores, nosebleeds, sore throat, tinnitus, trouble swallowing and voice change.   Eyes: Negative.  Negative for eye problems and icterus.  Respiratory: Negative.  Negative for chest tightness, cough, hemoptysis, shortness of breath and wheezing.   Cardiovascular: Negative.  Negative for chest pain, leg swelling and palpitations.  Gastrointestinal: Negative.  Negative for abdominal distention, abdominal pain, blood in stool, constipation, diarrhea, nausea, rectal pain and vomiting.  Endocrine: Negative.  Negative for hot flashes.  Genitourinary:  Negative for bladder incontinence, difficulty urinating, dyspareunia, dysuria, frequency, hematuria, menstrual problem, nocturia, pelvic pain, vaginal bleeding and vaginal discharge.   Musculoskeletal:  Positive for back pain and flank pain. Negative for arthralgias, gait problem, myalgias, neck pain and neck stiffness.  Skin: Negative.  Negative for itching, rash and wound.  Neurological: Negative.  Negative for dizziness,  extremity weakness, gait problem, headaches, light-headedness, numbness, seizures and speech difficulty.  Hematological: Negative.  Negative for adenopathy. Does not bruise/bleed easily.  Psychiatric/Behavioral: Negative.  Negative for confusion, decreased concentration, depression, sleep disturbance and suicidal ideas. The patient is not nervous/anxious.   All other systems reviewed and are negative.    VITALS:  Blood pressure (!) 141/72, pulse 80, temperature 97.8 F (36.6 C), temperature source Oral, resp. rate 20, height '5\' 2"'$  (1.575 m), weight 159 lb 4.8 oz (72.3 kg), SpO2 99 %.  Wt Readings from Last 3 Encounters:  11/25/22 159 lb 4.8 oz (72.3 kg)  11/08/22 159 lb 4.8 oz (72.3 kg)  10/25/22 159 lb 6.4 oz (72.3 kg)    Body mass index is 29.14 kg/m.  Performance status (ECOG): 0 - Asymptomatic  PHYSICAL EXAM:  Physical exam was deferred in order to have a lengthy discussion about the new findings and  discuss them with her daughter as well so I can answer their questions. Physical Exam Vitals and nursing note reviewed. Exam conducted with a chaperone present.  Constitutional:      General: She is not in acute distress.    Appearance: Normal appearance. She is normal weight. She is not ill-appearing, toxic-appearing or diaphoretic.  HENT:     Head: Normocephalic and atraumatic.     Right Ear: Tympanic membrane, ear canal and external ear normal. There is no impacted cerumen.     Left Ear: Tympanic membrane, ear canal and external ear normal. There is no impacted cerumen.     Nose: Nose normal. No congestion or rhinorrhea.     Mouth/Throat:     Mouth: Mucous membranes are moist.     Pharynx: Oropharynx is clear. No oropharyngeal exudate or posterior oropharyngeal erythema.  Eyes:     General: No scleral icterus.       Right eye: No discharge.        Left eye: No discharge.     Extraocular Movements: Extraocular movements intact.     Conjunctiva/sclera: Conjunctivae normal.      Pupils: Pupils are equal, round, and reactive to light.  Neck:     Vascular: No carotid bruit.  Cardiovascular:     Rate and Rhythm: Normal rate and regular rhythm.     Pulses: Normal pulses.     Heart sounds: Normal heart sounds. No murmur heard.    No friction rub. No gallop.  Pulmonary:     Effort: Pulmonary effort is normal. No respiratory distress.     Breath sounds: No stridor. No decreased breath sounds, wheezing, rhonchi or rales.  Chest:     Chest wall: No tenderness.     Comments: 1-2+ lymphedema of the left upper extremity. Well healed scar at the lower outter qadrant on the left breast. Mild fibrocystic changes of the right breast. No masses in either breast.  Abdominal:     General: Bowel sounds are normal. There is no distension.     Palpations: Abdomen is soft. There is hepatomegaly (mild). There is no splenomegaly or mass.     Tenderness: There is no abdominal tenderness. There is no right CVA tenderness, left CVA tenderness, guarding or rebound.     Hernia: No hernia is present.  Musculoskeletal:        General: No swelling, tenderness, deformity or signs of injury. Normal range of motion.     Cervical back: Normal range of motion and neck supple. No rigidity or tenderness.     Right lower leg: No edema.     Left lower leg: No edema.  Lymphadenopathy:     Cervical: No cervical adenopathy.     Right cervical: No superficial, deep or posterior cervical adenopathy.    Left cervical: No superficial, deep or posterior cervical adenopathy.     Upper Body:     Right upper body: No supraclavicular, axillary or pectoral adenopathy.     Left upper body: No supraclavicular, axillary or pectoral adenopathy.  Skin:    General: Skin is warm and dry.     Coloration: Skin is not jaundiced or pale.     Findings: No bruising, erythema, lesion or rash.  Neurological:     General: No focal deficit present.     Mental Status: She is alert and oriented to person, place, and time.  Mental status is at baseline.     Cranial Nerves: No cranial nerve deficit.  Sensory: No sensory deficit.     Motor: No weakness.     Coordination: Coordination normal.     Gait: Gait normal.     Deep Tendon Reflexes: Reflexes normal.  Psychiatric:        Mood and Affect: Mood normal.        Behavior: Behavior normal.        Thought Content: Thought content normal.        Judgment: Judgment normal.    LABS:      Latest Ref Rng & Units 11/18/2022   12:00 AM 10/23/2022   12:00 AM 10/09/2022    9:34 AM  CBC  WBC  4.8     3.5     3.9   Hemoglobin 12.0 - 16.0 13.9     13.0     13.0   Hematocrit 36 - 46 41     39     39.2   Platelets 150 - 400 K/uL 318     327     315      This result is from an external source.      Latest Ref Rng & Units 11/18/2022   12:00 AM 10/23/2022   12:00 AM 10/09/2022    9:34 AM  CMP  Glucose 70 - 99 mg/dL   89   BUN 4 - '21 16     21     14   '$ Creatinine 0.5 - 1.1 0.7     0.9     0.69   Sodium 137 - 147 140     140     141   Potassium 3.5 - 5.1 mEq/L 4.2     4.4     3.9   Chloride 99 - 108 109     103     107   CO2 13 - '22 26     26     27   '$ Calcium 8.7 - 10.7 9.2     9.8     9.1   Total Protein 6.5 - 8.1 g/dL   7.3   Total Bilirubin 0.3 - 1.2 mg/dL   0.6   Alkaline Phos 25 - 125 44     35     33   AST 13 - 35 '31     24     26   '$ ALT 7 - 35 U/L '18     13     20      '$ This result is from an external source.   Component Ref Range & Units 11/18/2022  INR 0.80 - 1.20 1.00     Lab Results  Component Value Date   CEA1 1.6 10/11/2021   /  CEA  Date Value Ref Range Status  10/11/2021 1.6 0.0 - 4.7 ng/mL Final    Comment:    (NOTE)                             Nonsmokers          <3.9                             Smokers             <5.6 Roche [employer] Electrochemiluminescence Immunoassay (ECLIA) Values obtained with different assay methods or kits cannot be used interchangeably.  Results cannot be interpreted as absolute evidence of the  presence  or absence of malignant disease. Performed At: [facility] [place], [place] [id number] Rush Farmer MD [id number]    No results found for: "PSA1" No results found for: "[phone]" No results found for: "CAN125"  No results found for: "TOTALPROTELP", "ALBUMINELP", "A1GS", "A2GS", "BETS", "BETA2SER", "GAMS", "MSPIKE", "SPEI" No results found for: "TIBC", "FERRITIN", "IRONPCTSAT" No results found for: "LDH"  STUDIES:  No results found.    Exam: 11/07/2022 Nuclear Medicine PET skull Base to Thigh Impression: Widespread metastatic disease. Findings could be due to metastatic breast cancer or lung cancer. There is a sizable right lower lobe/right infrahilar mass which is more suggestive of a recurrent lung cancer with associated mediastinal and hilar adenopathy. Metastatic pulmonary and pleural nodules, metastatic hepatic and osseous lesions and muscle metastasis. Large right pleural effusion is likely malignant.  Other incidental findings as detailed.         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  Report date is 11/29/21.   The [facility]+RNAinsight panel offered by [facility] includes sequencing and rearrangement analysis for the following 47 genes:  APC, ATM, AXIN2, BARD1, BMPR1A, BRCA1, BRCA2, BRIP1, CDH1, CDK4, CDKN2A, CHEK2, DICER1, EPCAM, GREM1, HOXB13,     History of left breast cancer 06/13/2021   Hypotension 06/13/2021   Incisional hernia, without obstruction or gangrene 10/22/2016   Malignant neoplasm of upper-outer quadrant of left female breast ([place]) 03/07/2006   Mixed dyslipidemia 11/03/2018   Numbness in right leg    Hx: of   Obesity (BMI 30.0-34.9) 09/21/2020   Other specified disorders of bone density and structure, multiple sites 09/20/2020   Paroxysmal atrial fibrillation ([place]) 02/13/2016   Personal history of COVID-19 09/2019   Pleural effusion, 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   Pneumonia    Hx: of 'a long time ago"   Postoperative examination 02/05/2016    Past Surgical History:  Procedure Laterality Date   APPENDECTOMY     BACK SURGERY     BREAST SURGERY     Hx: of lumpectomy left breast 2007   COLONOSCOPY     Hx: of   DILATION AND CURETTAGE OF UTERUS     TONSILLECTOMY     TOTAL HIP ARTHROPLASTY Right 04/13/2013   Dr Ninfa Linden   TOTAL HIP ARTHROPLASTY Right 04/13/2013   Procedure: RIGHT TOTAL HIP ARTHROPLASTY ANTERIOR APPROACH;  Surgeon: Mcarthur Rossetti, MD;  Location: [facility];  Service: Orthopedics;  Laterality: Right;   TUBAL LIGATION      Family History  Problem Relation Age of Onset   Hypertension Sister    Cancer Sister 57       uterine or ovarian   Bone cancer Sister 19   Breast cancer Sister        dx 43 and 60   Uterine cancer Sister 43   Colon cancer Sister 19   Breast cancer Sister 73   Hypertension Brother    Kidney cancer Brother 41  Cancer Brother 43       unknown type; mets   Liver cancer Brother    Liver cancer Brother 101   Pancreatic cancer Niece 89   Breast cancer Niece 40   Colon cancer Nephew 74       mets    Social History:  reports that she has never smoked. She has never used smokeless tobacco. She reports that she does not drink alcohol and does not use drugs.The patient is alone today.  Allergies:  Allergies  Allergen Reactions   Codeine     unknown    Sulfa Antibiotics     unknown     Chlorhexidine Rash    Current Medications: Current Outpatient Medications  Medication Sig Dispense Refill   apixaban (ELIQUIS) 5 MG TABS tablet Take 1 tablet (5 mg total) by mouth 2 (two) times daily. 180 tablet 3   atorvastatin (LIPITOR) 10 MG tablet Take 1 tablet by mouth every other day.  4   CALCIUM PO Take 1 tablet by mouth daily.     cholecalciferol (VITAMIN D) 1000 UNITS tablet Take 1,000 Units by mouth daily.     denosumab (PROLIA) 60 MG/ML SOLN injection Inject 60 mg into the skin every 6 (six) months. Administer in upper arm, thigh, or abdomen     elacestrant hydrochloride (ORSERDU) 345 MG tablet Take 1 tablet (345 mg total) by mouth daily. Take with food. 30 tablet 5   Flaxseed, Linseed, OIL Take 1 capsule by mouth daily.     glucosamine-chondroitin 500-400 MG tablet Take 1 tablet by mouth 2 (two) times daily.      levothyroxine (SYNTHROID, LEVOTHROID) 75 MCG tablet Take 75 mcg by mouth daily.   7   metoprolol tartrate (LOPRESSOR) 25 MG tablet Take 1 tablet (25 mg total) by mouth 2 (two) times daily. 180 tablet 3   Multiple Vitamin (MULTIVITAMIN WITH MINERALS) TABS Take 1 tablet by mouth daily.     ondansetron (ZOFRAN) 4 MG tablet Take 1 tablet (4 mg total) by mouth every 4 (four) hours as needed for nausea or vomiting. 30 tablet 5   prochlorperazine (COMPAZINE) 10 MG tablet Take 1 tablet (10 mg total) by mouth every 6 (six) hours as needed for nausea or vomiting. 30 tablet 5   No current facility-administered medications for this visit.      I,Jasmine M Lassiter,acting as a scribe for Derwood Kaplan, MD.,have documented all relevant documentation on the behalf of Derwood Kaplan, MD,as directed by  Derwood Kaplan, MD while in the presence of Derwood Kaplan, MD.

## 2022-11-25 ENCOUNTER — Inpatient Hospital Stay: Payer: Medicare HMO | Admitting: Oncology

## 2022-11-25 ENCOUNTER — Encounter: Payer: Self-pay | Admitting: Oncology

## 2022-11-25 ENCOUNTER — Telehealth: Payer: Self-pay | Admitting: Oncology

## 2022-11-25 ENCOUNTER — Other Ambulatory Visit: Payer: Self-pay | Admitting: Oncology

## 2022-11-25 ENCOUNTER — Telehealth: Payer: Self-pay | Admitting: Pharmacy Technician

## 2022-11-25 ENCOUNTER — Other Ambulatory Visit (HOSPITAL_COMMUNITY): Payer: Self-pay

## 2022-11-25 VITALS — BP 141/72 | HR 80 | Temp 97.8°F | Resp 20 | Ht 62.0 in | Wt 159.3 lb

## 2022-11-25 DIAGNOSIS — C7951 Secondary malignant neoplasm of bone: Secondary | ICD-10-CM

## 2022-11-25 DIAGNOSIS — Z170 Estrogen receptor positive status [ER+]: Secondary | ICD-10-CM | POA: Diagnosis not present

## 2022-11-25 DIAGNOSIS — C50412 Malignant neoplasm of upper-outer quadrant of left female breast: Secondary | ICD-10-CM

## 2022-11-25 DIAGNOSIS — D702 Other drug-induced agranulocytosis: Secondary | ICD-10-CM

## 2022-11-25 DIAGNOSIS — C787 Secondary malignant neoplasm of liver and intrahepatic bile duct: Secondary | ICD-10-CM | POA: Diagnosis not present

## 2022-11-25 DIAGNOSIS — J910 Malignant pleural effusion: Secondary | ICD-10-CM

## 2022-11-25 MED ORDER — ELACESTRANT HYDROCHLORIDE 345 MG PO TABS
345.0000 mg | ORAL_TABLET | Freq: Every day | ORAL | 5 refills | Status: DC
  Filled 2022-11-25: qty 30, 30d supply, fill #0

## 2022-11-25 NOTE — Telephone Encounter (Signed)
Patient has been scheduled for an appt with [facility] for education. Aware of appt date and time.

## 2022-11-25 NOTE — Telephone Encounter (Signed)
Oral Oncology Patient Advocate Encounter   Received notification that prior authorization for Orserdu is required.   PA submitted on 11/25/22 Key [id number] Status is pending     Lady Deutscher, CPhT-Adv Oncology Pharmacy Patient [place] Direct Number: [phone]  Fax: [phone]

## 2022-11-25 NOTE — Telephone Encounter (Signed)
Oral Oncology Patient Advocate Encounter  Prior Authorization for Orserdu has been approved.    PA# [id number] Effective dates: 11/25/22 through 10/07/23  Patients co-pay is $3,026.60.    Lady Deutscher, CPhT-Adv Oncology Pharmacy Patient [place] Direct Number: [phone]  Fax: [phone]

## 2022-11-26 ENCOUNTER — Other Ambulatory Visit: Payer: Self-pay

## 2022-11-26 ENCOUNTER — Other Ambulatory Visit (HOSPITAL_COMMUNITY): Payer: Self-pay

## 2022-11-26 ENCOUNTER — Telehealth: Payer: Self-pay

## 2022-11-26 ENCOUNTER — Encounter: Payer: Self-pay | Admitting: Oncology

## 2022-11-26 DIAGNOSIS — Z170 Estrogen receptor positive status [ER+]: Secondary | ICD-10-CM

## 2022-11-26 MED ORDER — ELACESTRANT HYDROCHLORIDE 345 MG PO TABS: 345.0000 mg | ORAL_TABLET | Freq: Every day | ORAL | 5 refills | Status: DC

## 2022-11-26 NOTE — Addendum Note (Signed)
Addended by: Juanetta Beets on: 11/26/2022 01:00 PM   Modules accepted: Orders

## 2022-11-26 NOTE — Telephone Encounter (Signed)
I see a MRI head that was done 10/19/21.

## 2022-11-26 NOTE — Telephone Encounter (Signed)
Oral Oncology Pharmacist Encounter  Received new prescription for elacestrant (Orserdu) for the treatment of ESR1 positive, HER2 negative breast cancer, planned duration until disease progression or unacceptable toxicity.  Labs from 11/18/22 assessed, no interventions needed. Prescription dose and frequency assessed for appropriateness.   Current medication list in Epic reviewed, no significant/ relevant DDIs with Orserdu identified:  Evaluated chart and no patient barriers to medication adherence noted.   Patient agreement for treatment documented in MD note on 11/25/22.  Prescription has been e-scribed to the [facility] for benefits analysis and approval. As orserdu is a limited distribution medication, prescription will be forwarded to [facility] pharmacy. Patient has a Art therapist for medication cost.  Oral Oncology Clinic will continue to follow for insurance authorization, copayment issues, initial counseling and start date.  Drema Halon, PharmD Hematology/Oncology Clinical Pharmacist [facility] [phone] 11/26/2022 8:21 AM

## 2022-11-26 NOTE — Telephone Encounter (Signed)
-----   Message from Derwood Kaplan, MD sent at 11/25/2022  7:06 PM EST ----- Regarding: brain scan Look in meditech to see if she has had any scan of brain in the last year

## 2022-11-27 ENCOUNTER — Inpatient Hospital Stay (HOSPITAL_BASED_OUTPATIENT_CLINIC_OR_DEPARTMENT_OTHER): Payer: Medicare HMO

## 2022-11-27 ENCOUNTER — Other Ambulatory Visit: Payer: Self-pay | Admitting: Oncology

## 2022-11-27 ENCOUNTER — Encounter: Payer: Self-pay | Admitting: Oncology

## 2022-11-27 DIAGNOSIS — C787 Secondary malignant neoplasm of liver and intrahepatic bile duct: Secondary | ICD-10-CM

## 2022-11-27 DIAGNOSIS — C50412 Malignant neoplasm of upper-outer quadrant of left female breast: Secondary | ICD-10-CM

## 2022-11-27 DIAGNOSIS — Z170 Estrogen receptor positive status [ER+]: Secondary | ICD-10-CM

## 2022-11-27 MED ORDER — ONDANSETRON HCL 4 MG PO TABS: 4.0000 mg | ORAL_TABLET | ORAL | 5 refills | Status: DC | PRN

## 2022-11-27 MED ORDER — PROCHLORPERAZINE MALEATE 10 MG PO TABS: 10.0000 mg | ORAL_TABLET | Freq: Four times a day (QID) | ORAL | 5 refills | Status: DC | PRN

## 2022-11-27 NOTE — Progress Notes (Signed)
Oral Chemotherapy Pharmacist Encounter   I spoke with patient for overview of: Orserdu (elacestrant) for the  treatment of ESR1 positive, HER2 negative breast cancer, planned duration until disease progression or unacceptable drug toxicity.  Counseled patient on administration, dosing, side effects, monitoring, drug-food interactions, safe handling, storage, and disposal.  Patient will take Orserdu '345mg'$  tablets, 1 tablet ('345mg'$ ) by mouth once daily with food.  Orserdu start date: 11/28/22  Adverse effects include but are not limited to: fatigue, decreased blood counts, GI upset, diarrhea, mouth sores, and hand-foot syndrome.  Patient has anti-emetic on hand (prescription sent in by MD on 11/27/22) and knows to take it if nausea develops.   Patient will obtain anti diarrheal and alert the office of 4 or more loose stools above baseline.  N  Reviewed with patient importance of keeping a medication schedule and plan for any missed doses. No barriers to medication adherence identified.  Medication reconciliation performed and medication/allergy list updated.  This will ship from the [facility] on 11/26/22 to deliver to patient's home on 11/27/22.  Patient informed the pharmacy will reach out 5-7 days prior to needing next fill of Xeloda to coordinate continued medication acquisition to prevent break in therapy.  All questions answered.  Patient voiced understanding and appreciation.   Medication education handout placed in mail for patient. Patient knows to call the office with questions or concerns. Oral Chemotherapy Clinic phone number provided to patient.   Drema Halon, PharmD Hematology/Oncology Clinical Pharmacist [facility] [phone]

## 2022-11-28 NOTE — Progress Notes (Signed)
Spoke with Carrie Wilkerson about applying for free Eliquis through [employer]. She will have to spend 3% out of pocket on medication before she can be approved. She is going to get print out from her pharmacy and give me a call back.

## 2022-11-29 DIAGNOSIS — J069 Acute upper respiratory infection, unspecified: Secondary | ICD-10-CM | POA: Diagnosis not present

## 2022-11-29 DIAGNOSIS — Z20822 Contact with and (suspected) exposure to covid-19: Secondary | ICD-10-CM | POA: Diagnosis not present

## 2022-11-29 DIAGNOSIS — H1031 Unspecified acute conjunctivitis, right eye: Secondary | ICD-10-CM | POA: Diagnosis not present

## 2022-11-29 DIAGNOSIS — U071 COVID-19: Secondary | ICD-10-CM | POA: Diagnosis not present

## 2022-12-04 ENCOUNTER — Telehealth: Payer: Self-pay | Admitting: Oncology

## 2022-12-04 NOTE — Telephone Encounter (Signed)
Patient has been scheduled. Contacted pt to notify of appt. Unable to reach via phone.     Scheduling Message Entered by Juanetta Beets on 12/03/2022 at  3:16 PM Priority: Routine <No visit type provided>  Department: [facility]  Provider:  Appointment Notes:  Please schedule pt to start xgeva on 3/8 and monthly thereafter.  She will need labs 2 days prior to each dose.  Scheduling Notes:

## 2022-12-06 ENCOUNTER — Encounter: Payer: Self-pay | Admitting: Oncology

## 2022-12-06 ENCOUNTER — Telehealth: Payer: Self-pay

## 2022-12-06 NOTE — Telephone Encounter (Signed)
I called to see how pt was doping with the Orserdu. She replied, "I'm doing ok with that. But I have the COVID and pink eye". Her husband has COVID as well. She thinks they got it @ church. She hasn't had any fevers, just bad cough & runny nose. She is taking the Orserdu with breakfast. No missed doses. She denies N/V, mouth sores, hand/foot syndrome, muscle pain, skin rash/itching, fever, and swelling. She admits to a little diarrhea, but not bad she mentions. She does have Imodium to use in the home if needed. She has fatigue, but is able to complete her ADL's independently. I encouraged pt to drink plenty of fluids to help keep the phlegm thin. I reminded her to call us if she were to develop temp of 100.4 or higher, day or night. She verbalized understanding. We confirmed her next appt.

## 2022-12-09 NOTE — Progress Notes (Signed)
[facility]  [address] 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 CT chest in December revealed right middle and lower lobe collapse with large right pleural effusion. PET was negative.  She required thoracentesis on 2 occasions, the last in January.  Cytology revealed malignant cells consistent with breast primary, ER/PR positive.  She was started on anastrozole 1 mg daily on January 5.  Oral chemotherapy with palbociclib 125 mg was added in February.  After 1 cycle, she developed severe neutropenia. Her 2nd cycle was postponed 1 week.  Prior to a 3rd cycle, she had recurrent neutropenia, so her chemotherapy was held for 2 weeks and the palbociclib dose reduced to 100 mg daily.  She had persistent neutropenia and the dose was decreased to '75mg'$  daily.  CT chest, abdomen and pelvis on July 24 revealed a decrease in the large right pleural effusion and compressive right lung atelectasis. Now her scan in January, 2024 reveals a new nodule in  the right middle lobe measuring 54m. But more concerning are the new lesions in the liver. She still has a large right pleural effusion that remains stable.  Right Lung Mass She has a hypermetabolic right infrahilar mass measuring 2.4cm with an SUV 16.87. This was found to be metastatic breast cancer. She has never smoked. Therefore I did Guardant testing and found a mutation in ESR1, we have therefore started her on Orserdu as of March, 2024.   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 At the same time this will also evaluate for any new metastatic disease. I think she is strong enough and willing to pursue further treatment and we discussed a few options.  We therefore obtained a liver biopsy to confirm the diagnosis and this is positive for metastatic breast cancer.   [facility] quadrant [facility] of stage IIIA hormone receptor positive breast cancer diagnosed in June 2007, treated with surgery, chemotherapy, radiation and 10 years of adjuvant endocrine therapy.  She now has recurrent disease.   Malignant Neoplasm Metastatic to Bone On PET scans she has multiple bone lesions including the right 11th rib, left iliac bone, and left sacrum. She also has metastases noted in muscle tissue, bilateral paraspinal musculature. She will need monthly bone strengthening medications such as Xgeva or Zometa.   Other specified disorders of bone density and structure, multiple sites Osteopenia, for which she has been receiving denosumab every 6 months, next due in September.  She continues calcium and vitamin-D as recommended.  She had a repeat bone density in December, 2023, which is stable.  Her last dose was given in September. She will now change to monthly injections due to her bone metastases.    Lymphedema This is a new finding of her left upper extremity and 1+ at this time.  I gave her some literature about the diagnosis of lymphedema.  I find no adenopathy or tumor of the axilla.     Plan  I will prescribe tessalon perles '100mg'$  TID  as needed for her cough. She continues to take Orserdu without complications. She will receive her Xgeva injection on 03/80/2024 and then once monthly. I will see her back in 2 weeks with CBC, CMP, and she will see Verline Lema our pharmacist. The patient understands the plans discussed today and is in agreement with them.  I reviewed all of this information with her and her  daughter today and answered their questions.  She knows to contact our office if she develops concerns prior to her next appointment.  I provided 13 minutes of face-to-face time during this encounter and > 50% was spent counseling as documented under my assessment and plan.      Derwood Kaplan, MD  [facility] [address] [place] [address] Dept: [phone] Dept Fax: [phone]   No orders of the defined types were placed in this encounter.    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, bone and lung/pleura  Current Treatment: Elacestrant oral chemotherapy with monthly Xgeva  HISTORY OF PRESENT ILLNESS:   Oncology History  Malignant neoplasm of upper-outer quadrant of left female breast  03/07/2006 Initial Diagnosis   Breast cancer, left breast ([facility])   03/17/2006 Cancer Staging   Staging form: Breast, AJCC 6th Edition - Clinical stage from 03/17/2006: Stage IIIA (T1c, N2a, M0) - Signed by Derwood Kaplan, MD on 12/15/2020 Staged by: Managing physician Diagnostic confirmation: Positive histology Specimen type: Excision Histopathologic type: Infiltrating duct carcinoma, NOS Tumor size (mm): 15 Laterality: Left Total positive nodes: 5 Histologic grade (G): G1 Residual tumor (R): R0 - None Stage prefix: Initial diagnosis Lymphatic vessel invasion (L): L0 - No lymphatic vessel invasion Venous invasion (V): V0 - No venous invasion Prognostic indicators: ER/PR pos, HER 2 neg., Rx with AC x 3 mo, then Taxol x 3 mo.,AI x 10 yrs       INTERVAL HISTORY:  Carrie Wilkerson is here today for repeat clinical assessment of recurrent hormone receptor positive breast cancer. 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 A PET scan was done 11/07/2022 which confirmed multiple liver lesions as well as multiple osseous lesions and even metastatic  deposits in her muscle. It also showed a hypermetabolic right infrahilar mass measuring 2.4cm with SUV max of 16.87 with small hypermetabolic pleural nodules and hypermetabolic mediastinal adenopathy. She still has a large right pleural effusion which is stable. Patient informed me that she had COVID and conjunctivitis 2 weeks ago. She has no energy shortness of breath, and a constant cough with clear/yellowish phlegm. She did complete a cycle of medication for COVID and takes throat lozenges for her cough.  I will prescribe tessalon perles 100mg  TID as needed for her cough. She continues to take Orserdu without complications. She will receive her Xgeva injection on 12/13/2022 and then once monthly. I will see her back in 2 weeks with CBC, CMP, and she will see [facility] our pharmacist.  She denies signs of infection such as sore throat, sinus drainage, cough, or urinary symptoms.  She denies fevers or recurrent chills. She denies pain. She denies nausea, vomiting, chest pain, dyspnea or cough. Her appetite is fair although food doesn't taste right since COVID and her weight has decreased 4 pounds over last month .  REVIEW OF SYSTEMS:  Review of Systems  Constitutional:  Positive for fatigue. Negative for appetite change, chills, diaphoresis, fever and unexpected weight change.  HENT:  Negative.  Negative for hearing loss, lump/mass, mouth sores, nosebleeds, sore throat, tinnitus, trouble swallowing and voice change.   Eyes: Negative.  Negative for eye problems and icterus.  Respiratory:  Positive for cough (white/yellowish phlegm) and shortness of breath. Negative for chest tightness, hemoptysis and wheezing.   Cardiovascular: Negative.  Negative for chest pain, leg swelling and palpitations.  Gastrointestinal: Negative.  Negative for abdominal distention, abdominal pain, blood in stool, constipation, diarrhea, nausea, rectal pain and vomiting.  Endocrine: Negative.  Negative for hot flashes.   Genitourinary:  Negative for bladder incontinence, difficulty urinating, dyspareunia, dysuria, frequency, hematuria, menstrual problem, nocturia, pelvic pain, vaginal bleeding and vaginal discharge.   Musculoskeletal:  Positive for back pain and flank pain. Negative for arthralgias, gait problem, myalgias, neck pain and neck stiffness.  Skin: Negative.  Negative for itching, rash and wound.  Neurological: Negative.  Negative for dizziness, extremity weakness, gait problem, headaches, light-headedness, numbness, seizures and speech difficulty.  Hematological: Negative.  Negative for adenopathy. Does not bruise/bleed easily.  Psychiatric/Behavioral: Negative.  Negative for confusion, decreased concentration, depression, sleep disturbance and suicidal ideas. The patient is not nervous/anxious.   All other systems reviewed and are negative.    VITALS:  Blood pressure (!) 121/53, pulse 75, temperature 97.9 F (36.6 C), temperature source Oral, resp. rate 20, height 5\' 2"  (1.575 m), weight 155 lb 11.2 oz (70.6 kg), SpO2 98 %.  Wt Readings from Last 3 Encounters:  12/13/22 154 lb 1.9 oz (69.9 kg)  12/11/22 155 lb 11.2 oz (70.6 kg)  11/25/22 159 lb 4.8 oz (72.3 kg)    Body mass index is 28.48 kg/m.  Performance status (ECOG): 0 - Asymptomatic  PHYSICAL EXAM:  Physical Exam Vitals and nursing note reviewed. Exam conducted with a chaperone present.  Constitutional:      General: She is not in acute distress.    Appearance: Normal appearance. She is normal weight. She is not ill-appearing, toxic-appearing or diaphoretic.  HENT:     Head: Normocephalic and atraumatic.     Right Ear: Tympanic membrane, ear canal and external ear normal. There is no impacted cerumen.     Left Ear: Tympanic membrane, ear canal and external ear normal. There is no impacted cerumen.     Nose: Nose normal. No congestion or rhinorrhea.     Mouth/Throat:     Mouth: Mucous membranes are moist.     Pharynx: Oropharynx  is clear. No oropharyngeal exudate or posterior oropharyngeal erythema.  Eyes:     General: No scleral icterus.       Right eye: No discharge.        Left eye: No discharge.     Extraocular Movements: Extraocular movements intact.     Conjunctiva/sclera: Conjunctivae normal.     Pupils: Pupils are equal, round, and reactive to light.  Neck:     Vascular: No carotid bruit.  Cardiovascular:     Rate and Rhythm: Normal rate and regular rhythm.     Pulses: Normal pulses.     Heart sounds: Normal heart sounds. No murmur heard.    No friction rub. No gallop.  Pulmonary:     Effort: Pulmonary effort is normal. No respiratory distress.     Breath sounds: No stridor. Examination of the right-lower field reveals decreased breath sounds. Decreased breath sounds present. No wheezing, rhonchi or rales.  Chest:     Chest wall: No tenderness.     Comments: 1-2+ lymphedema of the left upper extremity.  Abdominal:  General: Bowel sounds are normal. There is no distension.     Palpations: Abdomen is soft. There is no hepatomegaly, splenomegaly or mass.     Tenderness: There is no abdominal tenderness. There is no right CVA tenderness, left CVA tenderness, guarding or rebound.     Hernia: No hernia is present.  Musculoskeletal:        General: No swelling, tenderness, deformity or signs of injury. Normal range of motion.     Cervical back: Normal range of motion and neck supple. No rigidity or tenderness.     Right lower leg: No edema.     Left lower leg: No edema.     Comments: Mild lymphedema with a sleeve on the left upper extremity.   Lymphadenopathy:     Cervical: No cervical adenopathy.     Right cervical: No superficial, deep or posterior cervical adenopathy.    Left cervical: No superficial, deep or posterior cervical adenopathy.     Upper Body:     Right upper body: No supraclavicular, axillary or pectoral adenopathy.     Left upper body: No supraclavicular, axillary or pectoral  adenopathy.  Skin:    General: Skin is warm and dry.     Coloration: Skin is not jaundiced or pale.     Findings: No bruising, erythema, lesion or rash.  Neurological:     General: No focal deficit present.     Mental Status: She is alert and oriented to person, place, and time. Mental status is at baseline.     Cranial Nerves: No cranial nerve deficit.     Sensory: No sensory deficit.     Motor: No weakness.     Coordination: Coordination normal.     Gait: Gait normal.     Deep Tendon Reflexes: Reflexes normal.  Psychiatric:        Mood and Affect: Mood normal.        Behavior: Behavior normal.        Thought Content: Thought content normal.        Judgment: Judgment normal.    LABS:      Latest Ref Rng & Units 12/25/2022    2:06 PM 12/11/2022   12:00 AM 11/18/2022   12:00 AM  CBC  WBC 4.0 - 10.5 K/uL 6.3  7.1     4.8      Hemoglobin 12.0 - 15.0 g/dL 13.5  13.5     13.9      Hematocrit 36.0 - 46.0 % 42.1  40     41      Platelets 150 - 400 K/uL 228  334     318         This result is from an external source.      Latest Ref Rng & Units 12/25/2022    2:06 PM 12/11/2022    2:05 PM 11/18/2022   12:00 AM  CMP  Glucose 70 - 99 mg/dL 117  106    BUN 8 - 23 mg/dL 15  19  16       Creatinine 0.44 - 1.00 mg/dL 0.91  0.88  0.7      Sodium 135 - 145 mmol/L 141  140  140      Potassium 3.5 - 5.1 mmol/L 3.7  4.0  4.2      Chloride 98 - 111 mmol/L 105  104  109      CO2 22 - 32 mmol/L 27  28  26  Calcium 8.9 - 10.3 mg/dL 9.1  9.2  9.2      Total Protein 6.5 - 8.1 g/dL 6.9  6.9    Total Bilirubin 0.3 - 1.2 mg/dL 0.5  0.4    Alkaline Phos 38 - 126 U/L 44  44  44      AST 15 - 41 U/L 24  24  31       ALT 0 - 44 U/L 14  18  18          This result is from an external source.   Component Ref Range & Units 11/18/2022  INR 0.80 - 1.20 1.00     Lab Results  Component Value Date   CEA1 1.6 10/11/2021   /  CEA  Date Value Ref Range Status  10/11/2021 1.6 0.0 - 4.7 ng/mL Final     Comment:    (NOTE)                             Nonsmokers          <3.9                             Smokers             <5.6 Roche [employer] Electrochemiluminescence Immunoassay (ECLIA) Values obtained with different assay methods or kits cannot be used interchangeably.  Results cannot be interpreted as absolute evidence of the presence or absence of malignant disease. Performed At: [facility] [place], [place] [id number] Rush Farmer MD [id number]    No results found for: "PSA1" No results found for: "[id number]" No results found for: "CAN125"  No results found for: "TOTALPROTELP", "ALBUMINELP", "A1GS", "A2GS", "BETS", "BETA2SER", "GAMS", "MSPIKE", "SPEI" No results found for: "TIBC", "FERRITIN", "IRONPCTSAT" No results found for: "LDH"  STUDIES:  No results found.    Exam: 11/07/2022 Nuclear Medicine PET skull Base to Thigh Impression: Widespread metastatic disease. Findings could be due to metastatic breast cancer or lung cancer. There is a sizable right lower lobe/right infrahilar mass which is more suggestive of a recurrent lung cancer with associated mediastinal and hilar adenopathy. Metastatic pulmonary and pleural nodules, metastatic hepatic and osseous lesions and muscle metastasis. Large right pleural effusion is likely malignant.  Other incidental findings as detailed.         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  Report date is 11/29/21.   The [facility]+RNAinsight panel offered by [facility] includes sequencing and rearrangement analysis for the following 47 genes:  APC, ATM, AXIN2, BARD1, BMPR1A, BRCA1, BRCA2, BRIP1, CDH1, CDK4, CDKN2A, CHEK2, DICER1, EPCAM, GREM1, HOXB13,    History of left breast cancer 06/13/2021   Hypotension 06/13/2021   Incisional hernia, without obstruction or gangrene 10/22/2016   Malignant neoplasm of upper-outer quadrant of left female breast ([place]) 03/07/2006  Mixed dyslipidemia 11/03/2018   Numbness in right leg    Hx: of   Obesity (BMI 30.0-34.9) 09/21/2020   Other specified disorders of bone density and structure, multiple sites 09/20/2020   Paroxysmal atrial fibrillation ([place]) 02/13/2016   Personal history of COVID-19 09/2019   Pleural effusion, 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   Pneumonia    Hx: of 'a long time ago"   Postoperative examination 02/05/2016    Past Surgical History:  Procedure Laterality Date   APPENDECTOMY     BACK SURGERY     BREAST SURGERY     Hx: of lumpectomy left breast 2007   COLONOSCOPY     Hx: of   DILATION AND CURETTAGE OF UTERUS     TONSILLECTOMY     TOTAL HIP ARTHROPLASTY Right 04/13/2013   Dr Ninfa Linden   TOTAL HIP ARTHROPLASTY Right 04/13/2013   Procedure: RIGHT TOTAL HIP ARTHROPLASTY ANTERIOR APPROACH;  Surgeon: Mcarthur Rossetti, MD;  Location: [facility];  Service: Orthopedics;  Laterality: Right;   TUBAL LIGATION      Family History  Problem Relation Age of Onset   Hypertension Sister    Cancer Sister 83       uterine or ovarian   Bone cancer Sister 80   Breast cancer Sister        dx 94 and 64   Uterine cancer Sister 20   Colon cancer Sister 4   Breast cancer Sister 23   Hypertension Brother    Kidney cancer Brother 9   Cancer Brother 51       unknown type; mets   Liver cancer Brother    Liver cancer Brother 47   Pancreatic cancer Niece 30   Breast cancer Niece 90    Colon cancer Nephew 18       mets    Social History:  reports that she has never smoked. She has never used smokeless tobacco. She reports that she does not drink alcohol and does not use drugs.The patient is alone today.  Allergies:  Allergies  Allergen Reactions   Codeine     unknown    Sulfa Antibiotics     unknown   Sulfacetamide Sodium Other (See Comments)    unknown   Chlorhexidine Rash    Current Medications: Current Outpatient Medications  Medication Sig Dispense Refill   apixaban (ELIQUIS) 5 MG TABS tablet Take 1 tablet (5 mg total) by mouth 2 (two) times daily. 180 tablet 3   atorvastatin (LIPITOR) 10 MG tablet Take 1 tablet by mouth every other day.  4   benzonatate (TESSALON) 100 MG capsule Take 1 capsule (100 mg total) by mouth 3 (three) times daily as needed for cough. 20 capsule 5   calcium carbonate (OSCAL) 1500 (600 Ca) MG TABS tablet Take 600 mg of elemental calcium by mouth daily with breakfast.     cholecalciferol (VITAMIN D) 1000 UNITS tablet Take 1,000 Units by mouth daily.     denosumab (PROLIA) 60 MG/ML SOLN injection Inject 60 mg into the skin every 6 (six) months. Administer in upper arm, thigh, or abdomen     elacestrant hydrochloride (ORSERDU) 345 MG tablet Take 1 tablet (345 mg total) by mouth daily. Take with food. 30 tablet 5   Flaxseed, Linseed, OIL Take 1 capsule by mouth daily.     glucosamine-chondroitin 500-400 MG tablet Take 1 tablet by mouth 2 (two) times daily.      levothyroxine (SYNTHROID, LEVOTHROID) 75 MCG tablet Take 75 mcg by mouth daily.  7   metoprolol tartrate (LOPRESSOR) 25 MG tablet Take 1 tablet (25 mg total) by mouth 2 (two) times daily. 180 tablet 3   Multiple Vitamin (MULTIVITAMIN WITH MINERALS) TABS Take 1 tablet by mouth daily.     ondansetron (ZOFRAN) 4 MG tablet Take 1 tablet (4 mg total) by mouth every 4 (four) hours as needed for nausea or vomiting. 30 tablet 5   prochlorperazine (COMPAZINE) 10 MG tablet Take 1 tablet (10  mg total) by mouth every 6 (six) hours as needed for nausea or vomiting. 30 tablet 5   No current facility-administered medications for this visit.     I,Jasmine M Lassiter,acting as a scribe for Derwood Kaplan, MD.,have documented all relevant documentation on the behalf of Derwood Kaplan, MD,as directed by  Derwood Kaplan, MD while in the presence of Derwood Kaplan, MD.

## 2022-12-11 ENCOUNTER — Inpatient Hospital Stay: Payer: Medicare HMO | Attending: Oncology | Admitting: Oncology

## 2022-12-11 ENCOUNTER — Inpatient Hospital Stay: Payer: Medicare HMO

## 2022-12-11 ENCOUNTER — Other Ambulatory Visit: Payer: Self-pay | Admitting: Oncology

## 2022-12-11 ENCOUNTER — Encounter: Payer: Self-pay | Admitting: Oncology

## 2022-12-11 VITALS — BP 121/53 | HR 75 | Temp 97.9°F | Resp 20 | Ht 62.0 in | Wt 155.7 lb

## 2022-12-11 DIAGNOSIS — Z170 Estrogen receptor positive status [ER+]: Secondary | ICD-10-CM

## 2022-12-11 DIAGNOSIS — C50412 Malignant neoplasm of upper-outer quadrant of left female breast: Secondary | ICD-10-CM

## 2022-12-11 DIAGNOSIS — J910 Malignant pleural effusion: Secondary | ICD-10-CM | POA: Diagnosis not present

## 2022-12-11 DIAGNOSIS — C7951 Secondary malignant neoplasm of bone: Secondary | ICD-10-CM | POA: Insufficient documentation

## 2022-12-11 DIAGNOSIS — C787 Secondary malignant neoplasm of liver and intrahepatic bile duct: Secondary | ICD-10-CM

## 2022-12-11 DIAGNOSIS — R052 Subacute cough: Secondary | ICD-10-CM

## 2022-12-11 DIAGNOSIS — D649 Anemia, unspecified: Secondary | ICD-10-CM | POA: Diagnosis not present

## 2022-12-11 LAB — CBC: RBC: 4.17 (ref 3.87–5.11)

## 2022-12-11 LAB — CMP (CANCER CENTER ONLY)
ALT: 18 U/L (ref 0–44)
AST: 24 U/L (ref 15–41)
Albumin: 3.6 g/dL (ref 3.5–5.0)
Alkaline Phosphatase: 44 U/L (ref 38–126)
Anion gap: 8 (ref 5–15)
BUN: 19 mg/dL (ref 8–23)
CO2: 28 mmol/L (ref 22–32)
Calcium: 9.2 mg/dL (ref 8.9–10.3)
Chloride: 104 mmol/L (ref 98–111)
Creatinine: 0.88 mg/dL (ref 0.44–1.00)
GFR, Estimated: >60 mL/min (ref >60)
Glucose, Bld: 106 mg/dL — ABNORMAL HIGH (ref 70–99)
Potassium: 4 mmol/L (ref 3.5–5.1)
Sodium: 140 mmol/L (ref 135–145)
Total Bilirubin: 0.4 mg/dL (ref 0.3–1.2)
Total Protein: 6.9 g/dL (ref 6.5–8.1)

## 2022-12-11 LAB — CBC AND DIFFERENTIAL
HCT: 40 (ref 36–46)
Hemoglobin: 13.5 (ref 12.0–16.0)
Neutrophils Absolute: 4.97
Platelets: 334 10*3/uL (ref 150–400)
WBC: 7.1

## 2022-12-11 MED ORDER — BENZONATATE 100 MG PO CAPS: 100.0000 mg | ORAL_CAPSULE | Freq: Three times a day (TID) | ORAL | 5 refills | Status: DC | PRN

## 2022-12-12 NOTE — Addendum Note (Signed)
Addended by: Juanetta Beets on: 12/12/2022 10:51 AM   Modules accepted: Orders

## 2022-12-13 ENCOUNTER — Telehealth: Payer: Self-pay

## 2022-12-13 ENCOUNTER — Inpatient Hospital Stay: Payer: Medicare HMO

## 2022-12-13 VITALS — BP 116/63 | HR 88 | Temp 97.8°F | Resp 18 | Ht 62.0 in | Wt 154.1 lb

## 2022-12-13 DIAGNOSIS — Z170 Estrogen receptor positive status [ER+]: Secondary | ICD-10-CM | POA: Diagnosis not present

## 2022-12-13 DIAGNOSIS — C50412 Malignant neoplasm of upper-outer quadrant of left female breast: Secondary | ICD-10-CM

## 2022-12-13 DIAGNOSIS — C7951 Secondary malignant neoplasm of bone: Secondary | ICD-10-CM | POA: Diagnosis not present

## 2022-12-13 MED ORDER — FAMOTIDINE IN NACL 20-0.9 MG/50ML-% IV SOLN: 20.0000 mg | Freq: Once | INTRAVENOUS | Status: DC | PRN

## 2022-12-13 MED ORDER — EPINEPHRINE 0.3 MG/0.3ML IJ SOAJ: 0.3000 mg | Freq: Once | INTRAMUSCULAR | Status: DC | PRN

## 2022-12-13 MED ORDER — METHYLPREDNISOLONE SODIUM SUCC 125 MG IJ SOLR: 125.0000 mg | Freq: Once | INTRAMUSCULAR | Status: DC | PRN

## 2022-12-13 MED ORDER — ALBUTEROL SULFATE HFA 108 (90 BASE) MCG/ACT IN AERS: 2.0000 | INHALATION_SPRAY | Freq: Once | RESPIRATORY_TRACT | Status: DC | PRN

## 2022-12-13 MED ORDER — SODIUM CHLORIDE 0.9 % IV SOLN: Freq: Once | INTRAVENOUS | Status: DC | PRN

## 2022-12-13 MED ORDER — DENOSUMAB 120 MG/1.7ML ~~LOC~~ SOLN
120.0000 mg | Freq: Once | SUBCUTANEOUS | Status: AC
  Administered 2022-12-13: 120 mg via SUBCUTANEOUS
  Filled 2022-12-13: qty 1.7

## 2022-12-13 MED ORDER — DIPHENHYDRAMINE HCL 50 MG/ML IJ SOLN: 50.0000 mg | Freq: Once | INTRAMUSCULAR | Status: DC | PRN

## 2022-12-13 NOTE — Telephone Encounter (Signed)
-----   Message from Belva Chimes, LPN sent at 12/09/2874  1:17 PM EST ----- Regarding: FW: call  ----- Message ----- From: Derwood Kaplan, MD Sent: 12/13/2022  12:52 PM EST To: Belva Chimes, LPN Subject: call                                           Tell her labs look good

## 2022-12-13 NOTE — Telephone Encounter (Signed)
Message left labs look good.

## 2022-12-19 ENCOUNTER — Telehealth: Payer: Self-pay

## 2022-12-19 NOTE — Telephone Encounter (Signed)
I spoke with Carrie Wilkerson. She is feeling much better and has recovered from COVID and pink eye. She is taking the Orserdu @ breakfast. No missed doses. She denies N/V, mouth sores, hand/foot syndrome, diarrhea, skin rash/itching, new muscle pain, chills, and fever. Pt reminded to call us if she develops temp of 100.4 or higher, day or night. She verbalized understanding. Next appt 12/25/2022@ 1400.

## 2022-12-25 ENCOUNTER — Inpatient Hospital Stay: Payer: Medicare HMO

## 2022-12-25 DIAGNOSIS — C7951 Secondary malignant neoplasm of bone: Secondary | ICD-10-CM | POA: Diagnosis not present

## 2022-12-25 DIAGNOSIS — Z170 Estrogen receptor positive status [ER+]: Secondary | ICD-10-CM

## 2022-12-25 DIAGNOSIS — C50412 Malignant neoplasm of upper-outer quadrant of left female breast: Secondary | ICD-10-CM

## 2022-12-25 LAB — CBC WITH DIFFERENTIAL (CANCER CENTER ONLY)
Abs Immature Granulocytes: 0.02 10*3/uL (ref 0.00–0.07)
Basophils Absolute: 0.1 10*3/uL (ref 0.0–0.1)
Basophils Relative: 1 %
Eosinophils Absolute: 0.4 10*3/uL (ref 0.0–0.5)
Eosinophils Relative: 6 %
HCT: 42.1 % (ref 36.0–46.0)
Hemoglobin: 13.5 g/dL (ref 12.0–15.0)
Immature Granulocytes: 0 %
Lymphocytes Relative: 24 %
Lymphs Abs: 1.5 10*3/uL (ref 0.7–4.0)
MCH: 31.7 pg (ref 26.0–34.0)
MCHC: 32.1 g/dL (ref 30.0–36.0)
MCV: 98.8 fL (ref 80.0–100.0)
Monocytes Absolute: 0.6 10*3/uL (ref 0.1–1.0)
Monocytes Relative: 9 %
Neutro Abs: 3.7 10*3/uL (ref 1.7–7.7)
Neutrophils Relative %: 60 %
Platelet Count: 228 10*3/uL (ref 150–400)
RBC: 4.26 MIL/uL (ref 3.87–5.11)
RDW: 14 % (ref 11.5–15.5)
WBC Count: 6.3 10*3/uL (ref 4.0–10.5)
nRBC: 0 % (ref 0.0–0.2)

## 2022-12-25 LAB — CMP (CANCER CENTER ONLY)
ALT: 14 U/L (ref 0–44)
AST: 24 U/L (ref 15–41)
Albumin: 3.7 g/dL (ref 3.5–5.0)
Alkaline Phosphatase: 44 U/L (ref 38–126)
Anion gap: 9 (ref 5–15)
BUN: 15 mg/dL (ref 8–23)
CO2: 27 mmol/L (ref 22–32)
Calcium: 9.1 mg/dL (ref 8.9–10.3)
Chloride: 105 mmol/L (ref 98–111)
Creatinine: 0.91 mg/dL (ref 0.44–1.00)
GFR, Estimated: >60 mL/min (ref >60)
Glucose, Bld: 117 mg/dL — ABNORMAL HIGH (ref 70–99)
Potassium: 3.7 mmol/L (ref 3.5–5.1)
Sodium: 141 mmol/L (ref 135–145)
Total Bilirubin: 0.5 mg/dL (ref 0.3–1.2)
Total Protein: 6.9 g/dL (ref 6.5–8.1)

## 2023-01-06 ENCOUNTER — Telehealth: Payer: Self-pay

## 2023-01-06 NOTE — Telephone Encounter (Signed)
I spoke to pt to see how she is tolerating the Orserdu. She continues to take it a breakfast and doesn't really have any side effects. No missed doses. She denies N/V, mouth sores, diarrhea, skin rash, new muscle pain, chills and fever. We confirmed her next appt, which is this week, 01/08/23 @2p .

## 2023-01-07 ENCOUNTER — Encounter: Payer: Self-pay | Admitting: Oncology

## 2023-01-07 NOTE — Progress Notes (Signed)
[facility]  [address] 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 CT chest in December revealed right middle and lower lobe collapse with large right pleural effusion. PET was negative.  She required thoracentesis on 2 occasions, the last in January.  Cytology revealed malignant cells consistent with breast primary, ER/PR positive.  She was started on anastrozole 1 mg daily on January 5.  Oral chemotherapy with palbociclib 125 mg was added in February.  After 1 cycle, she developed severe neutropenia. Her 2nd cycle was postponed 1 week.  Prior to a 3rd cycle, she had recurrent neutropenia, so her chemotherapy was held for 2 weeks and the palbociclib dose reduced to 100 mg daily.  She had persistent neutropenia and the dose was decreased to 75mg  daily.  CT chest, abdomen and pelvis on July 24 revealed a decrease in the large right pleural effusion and compressive right lung atelectasis. Now her scan in January, 2024 reveals a new nodule in  the right middle lobe measuring 7mm. But more concerning are the new lesions in the liver as of February 1st, 2024. She still has a large right pleural effusion that remains stable.  Right Lung Mass She has a hypermetabolic right infrahilar mass measuring 2.4cm with an SUV 16.87. This was found to be metastatic breast cancer. She has never smoked. 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 I think  these are likely metastatic but I recommend a PET to be sure. At the same time this will also evaluate for any new metastatic disease. I think she is strong enough and willing to pursue further treatment and we discussed a few options.  We therefore obtained a liver biopsy to confirm the diagnosis and this is positive for metastatic breast cancer, ER positive and HER2 negative.    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  She now has recurrent disease.   Malignant Neoplasm Metastatic to Bone On PET scans she has multiple bone lesions including the right 11th rib, left iliac bone, and left sacrum. She also has metastases noted in muscle tissue, bilateral paraspinal musculature. She is now on Xgeva every 4 weeks.   Other specified disorders of bone density and structure, multiple sites Osteopenia, for which she has been receiving denosumab every 6 months, next due in September.  She continues calcium and vitamin-D as recommended.  She had a repeat bone density in December, 2023, which is stable.  Her last dose was given in September. She will now change to monthly injections due to her bone metastases.    Lymphedema This is a relatively new finding of her left upper extremity and 1+ at this time.  I gave her some literature about the diagnosis of lymphedema.  I find no adenopathy or tumor of the axilla.     Plan  She has been on Orserdu for a little over a month now and is tolerating it with no difficulties. She is due for denosumab on 01/10/2023.  I will change her to coming back once a month with CBC and CMP and cancel her upcoming appointment on 01/22/2023 with [facility] our pharmacist, but she will continue to be involved in the patients care. The patient understands the plans discussed today  and is in agreement with them.  I reviewed all of this information with her today and answered her questions.  She knows to contact our office if she develops concerns prior to her next appointment.  I provided 13 minutes of face-to-face time during this encounter and > 50% was spent counseling as documented under my assessment and plan.      Dellia Beckwith, MD  [facility] [address] [place] [address] Dept: [phone] Dept Fax: [phone]   No orders of the defined types were placed in this encounter.    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, bone and lung/pleura  Current Treatment: Elacestrant oral chemotherapy with monthly Xgeva  HISTORY OF PRESENT ILLNESS:   Oncology History  Malignant neoplasm of upper-outer quadrant of left female breast  03/07/2006 Initial Diagnosis   Breast cancer, left breast (HCC)   03/17/2006 Cancer Staging   Staging form: Breast, AJCC 6th Edition - Clinical stage from 03/17/2006: Stage IIIA (T1c, N2a, M0) - Signed by Dellia Beckwith, MD on 12/15/2020 Staged by: Managing physician Diagnostic confirmation: Positive histology Specimen type: Excision Histopathologic type: Infiltrating duct carcinoma, NOS Tumor size (mm): 15 Laterality: Left Total positive nodes: 5 Histologic grade (G): G1 Residual tumor (R): R0 - None Stage prefix: Initial diagnosis Lymphatic vessel invasion (L): L0 - No lymphatic vessel invasion Venous invasion (V): V0 - No venous invasion Prognostic indicators: ER/PR pos, HER 2 neg., Rx with AC x 3 mo, then Taxol x 3 mo.,AI x 10 yrs       INTERVAL HISTORY:  Carrie Wilkerson is here today for repeat clinical assessment of recurrent hormone receptor positive 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 Patient states that she  is well and doing good off oxygen but her O2 saturation was at 94% today. She experiences occasional constipation. She has been on Orserdu for a little over a month now and is tolerating it with no difficulties. She is due for denosumab on 01/10/2023.  I will change her to coming back once a month with CBC and CMP.   REVIEW OF SYSTEMS:  Review of Systems  Constitutional: Negative.  Negative for appetite change, chills, diaphoresis, fatigue, fever and unexpected weight change.  HENT:  Negative.  Negative for hearing loss, lump/mass, mouth sores, nosebleeds, sore throat, tinnitus, trouble swallowing and voice change.   Eyes: Negative.  Negative for eye problems and icterus.  Respiratory: Negative.  Negative for chest tightness, cough, hemoptysis, shortness of breath and wheezing.   Cardiovascular: Negative.  Negative for chest pain, leg swelling and palpitations.  Gastrointestinal:  Positive for constipation (occasional) and nausea (occasional). Negative for abdominal distention, abdominal pain, blood in stool, diarrhea, rectal pain and vomiting.  Endocrine: Negative.  Negative for hot flashes.  Genitourinary:  Negative for bladder incontinence, difficulty urinating, dyspareunia, dysuria, frequency, hematuria, menstrual problem, nocturia, pelvic pain, vaginal bleeding and vaginal discharge.   Musculoskeletal:  Positive for arthralgias (  chronic knee pain). Negative for back pain, flank pain, gait problem, myalgias, neck pain and neck stiffness.  Skin: Negative.  Negative for itching, rash and wound.  Neurological: Negative.  Negative for dizziness, extremity weakness, gait problem, headaches, light-headedness, numbness, seizures and speech difficulty.  Hematological: Negative.  Negative for adenopathy. Does not bruise/bleed easily.  Psychiatric/Behavioral: Negative.  Negative for confusion, decreased concentration, depression, sleep disturbance and suicidal ideas. The patient is not nervous/anxious.    All other systems reviewed and are negative.    VITALS:  Blood pressure 127/63, pulse 70, temperature 98 F (36.7 C), temperature source Oral, resp. rate 18, height 5\' 2"  (1.575 m), weight 155 lb 6.4 oz (70.5 kg), SpO2 94 %.  Wt Readings from Last 3 Encounters:  01/08/23 155 lb 6.4 oz (70.5 kg)  12/13/22 154 lb 1.9 oz (69.9 kg)  12/11/22 155 lb 11.2 oz (70.6 kg)    Body mass index is 28.42 kg/m.  Performance status (ECOG): 0 - Asymptomatic  PHYSICAL EXAM:  Physical Exam Vitals and nursing note reviewed. Exam conducted with a chaperone present.  Constitutional:      General: She is not in acute distress.    Appearance: Normal appearance. She is normal weight. She is not ill-appearing, toxic-appearing or diaphoretic.  HENT:     Head: Normocephalic and atraumatic.     Right Ear: Tympanic membrane, ear canal and external ear normal. There is no impacted cerumen.     Left Ear: Tympanic membrane, ear canal and external ear normal. There is no impacted cerumen.     Nose: Nose normal. No congestion or rhinorrhea.     Mouth/Throat:     Mouth: Mucous membranes are moist.     Pharynx: Oropharynx is clear. No oropharyngeal exudate or posterior oropharyngeal erythema.  Eyes:     General: No scleral icterus.       Right eye: No discharge.        Left eye: No discharge.     Extraocular Movements: Extraocular movements intact.     Conjunctiva/sclera: Conjunctivae normal.     Pupils: Pupils are equal, round, and reactive to light.  Neck:     Vascular: No carotid bruit.  Cardiovascular:     Rate and Rhythm: Normal rate and regular rhythm.     Pulses: Normal pulses.     Heart sounds: Normal heart sounds. No murmur heard.    No friction rub. No gallop.  Pulmonary:     Effort: Pulmonary effort is normal. No respiratory distress.     Breath sounds: No stridor. No decreased breath sounds, wheezing, rhonchi or rales.  Chest:     Chest wall: No tenderness.  Abdominal:     General: Bowel  sounds are normal. There is no distension.     Palpations: Abdomen is soft. There is no hepatomegaly, splenomegaly or mass.     Tenderness: There is no abdominal tenderness. There is no right CVA tenderness, left CVA tenderness, guarding or rebound.     Hernia: No hernia is present.  Musculoskeletal:        General: No swelling, tenderness, deformity or signs of injury. Normal range of motion.     Cervical back: Normal range of motion and neck supple. No rigidity or tenderness.     Right lower leg: No edema.     Left lower leg: No edema.     Comments: 1+ lymphedema of the left upper extremity    Lymphadenopathy:     Cervical: No cervical adenopathy.  Right cervical: No superficial, deep or posterior cervical adenopathy.    Left cervical: No superficial, deep or posterior cervical adenopathy.     Upper Body:     Right upper body: No supraclavicular, axillary or pectoral adenopathy.     Left upper body: No supraclavicular, axillary or pectoral adenopathy.  Skin:    General: Skin is warm and dry.     Coloration: Skin is not jaundiced or pale.     Findings: No bruising, erythema, lesion or rash.  Neurological:     General: No focal deficit present.     Mental Status: She is alert and oriented to person, place, and time. Mental status is at baseline.     Cranial Nerves: No cranial nerve deficit.     Sensory: No sensory deficit.     Motor: No weakness.     Coordination: Coordination normal.     Gait: Gait normal.     Deep Tendon Reflexes: Reflexes normal.  Psychiatric:        Mood and Affect: Mood normal.        Behavior: Behavior normal.        Thought Content: Thought content normal.        Judgment: Judgment normal.    LABS:      Latest Ref Rng & Units 01/08/2023    1:56 PM 12/25/2022    2:06 PM 12/11/2022   12:00 AM  CBC  WBC 4.0 - 10.5 K/uL 6.3  6.3  7.1      Hemoglobin 12.0 - 15.0 g/dL 16.1  09.6  04.5      Hematocrit 36.0 - 46.0 % 40.3  42.1  40      Platelets 150 -  400 K/uL 271  228  334         This result is from an external source.      Latest Ref Rng & Units 01/08/2023    1:56 PM 12/25/2022    2:06 PM 12/11/2022    2:05 PM  CMP  Glucose 70 - 99 mg/dL 409  811  914   BUN 8 - 23 mg/dL Creatinine 0.44 - 1.00 mg/dL 7.82  9.56  2.13   Sodium 135 - 145 mmol/L 139  141  140   Potassium 3.5 - 5.1 mmol/L 3.9  3.7  4.0   Chloride 98 - 111 mmol/L 106  105  104   CO2 22 - 32 mmol/L Calcium 8.9 - 10.3 mg/dL 9.1  9.1  9.2   Total Protein 6.5 - 8.1 g/dL 6.9  6.9  6.9   Total Bilirubin 0.3 - 1.2 mg/dL 0.4  0.5  0.4   Alkaline Phos 38 - 126 U/L 40  44  44   AST 15 - 41 U/L ALT 0 - 44 U/L Component Ref Range & Units 11/18/2022  INR 0.80 - 1.20 1.00     Lab Results  Component Value Date   CEA1 1.6 10/11/2021   /  CEA  Date Value Ref Range Status  10/11/2021 1.6 0.0 - 4.7 ng/mL Final    Comment:    (NOTE)                             Nonsmokers          <  3.9                             Smokers             <5.6 Roche [employer] Electrochemiluminescence Immunoassay (ECLIA) Values obtained with different assay methods or kits cannot be used interchangeably.  Results cannot be interpreted as absolute evidence of the presence or absence of malignant disease. Performed At: [facility] [address] [place], [place] [id number] Jolene Schimke MD WU:[id number]    No results found for: "PSA1" No results found for: "[id number]" No results found for: "CAN125"  No results found for: "TOTALPROTELP", "ALBUMINELP", "A1GS", "A2GS", "BETS", "BETA2SER", "GAMS", "MSPIKE", "SPEI" No results found for: "TIBC", "FERRITIN", "IRONPCTSAT" No results found for: "LDH"  STUDIES:  No results found.    Exam: 11/07/2022 Nuclear Medicine PET skull Base to Thigh Impression: Widespread metastatic disease. Findings could be due to metastatic breast cancer or lung cancer. There is a sizable right lower lobe/right  infrahilar mass which is more suggestive of a recurrent lung cancer with associated mediastinal and hilar adenopathy. Metastatic pulmonary and pleural nodules, metastatic hepatic and osseous lesions and muscle metastasis. Large right pleural effusion is likely malignant.  Other incidental findings as detailed.         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  Report date is 11/29/21.   The CustomNext-Cancer+RNAinsight panel offered by [employer] [facility] includes sequencing and rearrangement analysis for the following 47 genes:  APC, ATM, AXIN2, BARD1, BMPR1A, BRCA1, BRCA2, BRIP1, CDH1, CDK4, CDKN2A, CHEK2, DICER1, EPCAM, GREM1, HOXB13,    History of left breast cancer 06/13/2021   Hypotension 06/13/2021   Incisional hernia, without obstruction or gangrene 10/22/2016   Malignant neoplasm of upper-outer quadrant of left female breast 03/07/2006   Mixed dyslipidemia 11/03/2018   Numbness in right leg    Hx: of   Obesity (BMI 30.0-34.9) 09/21/2020   Other specified disorders of bone density and structure, multiple sites 09/20/2020   Paroxysmal atrial fibrillation 02/13/2016   Personal history of COVID-19 09/2019   Pleural effusion, 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   Pneumonia    Hx: of 'a long time ago"   Postoperative examination 02/05/2016    Past Surgical History:  Procedure Laterality Date   APPENDECTOMY     BACK SURGERY     BREAST SURGERY     Hx: of  lumpectomy left breast 2007   COLONOSCOPY     Hx: of   DILATION AND CURETTAGE OF UTERUS     TONSILLECTOMY     TOTAL HIP ARTHROPLASTY Right 04/13/2013   Dr Magnus Ivan   TOTAL HIP ARTHROPLASTY Right 04/13/2013   Procedure: RIGHT TOTAL HIP ARTHROPLASTY ANTERIOR APPROACH;  Surgeon: Kathryne Hitch, MD;  Location: [facility] OR;  Service: Orthopedics;  Laterality: Right;   TUBAL LIGATION      Family History  Problem Relation Age of Onset   Hypertension Sister  Cancer Sister 41       uterine or ovarian   Bone cancer Sister 67   Breast cancer Sister        dx 66 and 69   Uterine cancer Sister 53   Colon cancer Sister 25   Breast cancer Sister 17   Hypertension Brother    Kidney cancer Brother 52   Cancer Brother 63       unknown type; mets   Liver cancer Brother    Liver cancer Brother 63   Pancreatic cancer Niece 74   Breast cancer Niece 11   Colon cancer Nephew 57       mets    Social History:  reports that she has never smoked. She has never used smokeless tobacco. She reports that she does not drink alcohol and does not use drugs.The patient is alone today.  Allergies:  Allergies  Allergen Reactions   Codeine     unknown    Sulfa Antibiotics     unknown   Sulfacetamide Sodium Other (See Comments)    unknown   Chlorhexidine Rash    Current Medications: Current Outpatient Medications  Medication Sig Dispense Refill   apixaban (ELIQUIS) 5 MG TABS tablet Take 1 tablet (5 mg total) by mouth 2 (two) times daily. 180 tablet 3   atorvastatin (LIPITOR) 10 MG tablet Take 1 tablet by mouth every other day.  4   benzonatate (TESSALON) 100 MG capsule Take 1 capsule (100 mg total) by mouth 3 (three) times daily as needed for cough. 20 capsule 5   calcium carbonate (OSCAL) 1500 (600 Ca) MG TABS tablet Take 600 mg of elemental calcium by mouth daily with breakfast.     cholecalciferol (VITAMIN D) 1000 UNITS tablet Take 1,000 Units by mouth daily.     denosumab (PROLIA) 60 MG/ML  SOLN injection Inject 60 mg into the skin every 6 (six) months. Administer in upper arm, thigh, or abdomen     elacestrant hydrochloride (ORSERDU) 345 MG tablet Take 1 tablet (345 mg total) by mouth daily. Take with food. 30 tablet 5   Flaxseed, Linseed, OIL Take 1 capsule by mouth daily.     glucosamine-chondroitin 500-400 MG tablet Take 1 tablet by mouth 2 (two) times daily.      levothyroxine (SYNTHROID, LEVOTHROID) 75 MCG tablet Take 75 mcg by mouth daily.   7   metoprolol tartrate (LOPRESSOR) 25 MG tablet Take 1 tablet (25 mg total) by mouth 2 (two) times daily. 180 tablet 3   Multiple Vitamin (MULTIVITAMIN WITH MINERALS) TABS Take 1 tablet by mouth daily.     ondansetron (ZOFRAN) 4 MG tablet Take 1 tablet (4 mg total) by mouth every 4 (four) hours as needed for nausea or vomiting. 30 tablet 5   prochlorperazine (COMPAZINE) 10 MG tablet Take 1 tablet (10 mg total) by mouth every 6 (six) hours as needed for nausea or vomiting. 30 tablet 5   No current facility-administered medications for this visit.    I,Jasmine M Lassiter,acting as a scribe for Dellia Beckwith, MD.,have documented all relevant documentation on the behalf of Dellia Beckwith, MD,as directed by  Dellia Beckwith, MD while in the presence of Dellia Beckwith, MD.

## 2023-01-08 ENCOUNTER — Inpatient Hospital Stay: Payer: Medicare HMO

## 2023-01-08 ENCOUNTER — Inpatient Hospital Stay: Payer: Medicare HMO | Attending: Oncology | Admitting: Oncology

## 2023-01-08 ENCOUNTER — Encounter: Payer: Self-pay | Admitting: Oncology

## 2023-01-08 VITALS — BP 127/63 | HR 70 | Temp 98.0°F | Resp 18 | Ht 62.0 in | Wt 155.4 lb

## 2023-01-08 DIAGNOSIS — C787 Secondary malignant neoplasm of liver and intrahepatic bile duct: Secondary | ICD-10-CM | POA: Diagnosis not present

## 2023-01-08 DIAGNOSIS — C7951 Secondary malignant neoplasm of bone: Secondary | ICD-10-CM | POA: Diagnosis not present

## 2023-01-08 DIAGNOSIS — Z170 Estrogen receptor positive status [ER+]: Secondary | ICD-10-CM

## 2023-01-08 DIAGNOSIS — C50412 Malignant neoplasm of upper-outer quadrant of left female breast: Secondary | ICD-10-CM | POA: Insufficient documentation

## 2023-01-08 LAB — CBC WITH DIFFERENTIAL (CANCER CENTER ONLY)
Abs Immature Granulocytes: 0.02 10*3/uL (ref 0.00–0.07)
Basophils Absolute: 0.1 10*3/uL (ref 0.0–0.1)
Basophils Relative: 1 %
Eosinophils Absolute: 0.2 10*3/uL (ref 0.0–0.5)
Eosinophils Relative: 3 %
HCT: 40.3 % (ref 36.0–46.0)
Hemoglobin: 13.1 g/dL (ref 12.0–15.0)
Immature Granulocytes: 0 %
Lymphocytes Relative: 26 %
Lymphs Abs: 1.6 10*3/uL (ref 0.7–4.0)
MCH: 32 pg (ref 26.0–34.0)
MCHC: 32.5 g/dL (ref 30.0–36.0)
MCV: 98.3 fL (ref 80.0–100.0)
Monocytes Absolute: 0.7 10*3/uL (ref 0.1–1.0)
Monocytes Relative: 12 %
Neutro Abs: 3.6 10*3/uL (ref 1.7–7.7)
Neutrophils Relative %: 58 %
Platelet Count: 271 10*3/uL (ref 150–400)
RBC: 4.1 MIL/uL (ref 3.87–5.11)
RDW: 14 % (ref 11.5–15.5)
WBC Count: 6.3 10*3/uL (ref 4.0–10.5)
nRBC: 0 % (ref 0.0–0.2)

## 2023-01-08 LAB — CMP (CANCER CENTER ONLY)
ALT: 15 U/L (ref 0–44)
AST: 22 U/L (ref 15–41)
Albumin: 3.7 g/dL (ref 3.5–5.0)
Alkaline Phosphatase: 40 U/L (ref 38–126)
Anion gap: 8 (ref 5–15)
BUN: 16 mg/dL (ref 8–23)
CO2: 25 mmol/L (ref 22–32)
Calcium: 9.1 mg/dL (ref 8.9–10.3)
Chloride: 106 mmol/L (ref 98–111)
Creatinine: 0.72 mg/dL (ref 0.44–1.00)
GFR, Estimated: >60 mL/min (ref >60)
Glucose, Bld: 106 mg/dL — ABNORMAL HIGH (ref 70–99)
Potassium: 3.9 mmol/L (ref 3.5–5.1)
Sodium: 139 mmol/L (ref 135–145)
Total Bilirubin: 0.4 mg/dL (ref 0.3–1.2)
Total Protein: 6.9 g/dL (ref 6.5–8.1)

## 2023-01-10 ENCOUNTER — Inpatient Hospital Stay: Payer: Medicare HMO

## 2023-01-10 DIAGNOSIS — C787 Secondary malignant neoplasm of liver and intrahepatic bile duct: Secondary | ICD-10-CM | POA: Diagnosis not present

## 2023-01-10 DIAGNOSIS — C7951 Secondary malignant neoplasm of bone: Secondary | ICD-10-CM | POA: Diagnosis not present

## 2023-01-10 DIAGNOSIS — C50412 Malignant neoplasm of upper-outer quadrant of left female breast: Secondary | ICD-10-CM | POA: Diagnosis not present

## 2023-01-10 DIAGNOSIS — Z170 Estrogen receptor positive status [ER+]: Secondary | ICD-10-CM | POA: Diagnosis not present

## 2023-01-10 MED ORDER — DENOSUMAB 120 MG/1.7ML ~~LOC~~ SOLN
120.0000 mg | Freq: Once | SUBCUTANEOUS | Status: AC
  Administered 2023-01-10: 120 mg via SUBCUTANEOUS
  Filled 2023-01-10: qty 1.7

## 2023-01-22 ENCOUNTER — Ambulatory Visit: Payer: Medicare HMO

## 2023-01-22 ENCOUNTER — Other Ambulatory Visit: Payer: Medicare HMO

## 2023-01-28 ENCOUNTER — Encounter: Payer: Self-pay | Admitting: Oncology

## 2023-02-03 ENCOUNTER — Other Ambulatory Visit: Payer: Self-pay | Admitting: Cardiology

## 2023-02-05 ENCOUNTER — Inpatient Hospital Stay: Payer: Medicare HMO | Attending: Oncology

## 2023-02-05 ENCOUNTER — Inpatient Hospital Stay (HOSPITAL_BASED_OUTPATIENT_CLINIC_OR_DEPARTMENT_OTHER): Payer: Medicare HMO

## 2023-02-05 DIAGNOSIS — Z79899 Other long term (current) drug therapy: Secondary | ICD-10-CM | POA: Diagnosis not present

## 2023-02-05 DIAGNOSIS — C50412 Malignant neoplasm of upper-outer quadrant of left female breast: Secondary | ICD-10-CM | POA: Diagnosis not present

## 2023-02-05 DIAGNOSIS — Z170 Estrogen receptor positive status [ER+]: Secondary | ICD-10-CM | POA: Diagnosis not present

## 2023-02-05 DIAGNOSIS — C7951 Secondary malignant neoplasm of bone: Secondary | ICD-10-CM | POA: Insufficient documentation

## 2023-02-05 DIAGNOSIS — Z79620 Long term (current) use of immunosuppressive biologic: Secondary | ICD-10-CM | POA: Insufficient documentation

## 2023-02-05 DIAGNOSIS — C787 Secondary malignant neoplasm of liver and intrahepatic bile duct: Secondary | ICD-10-CM | POA: Insufficient documentation

## 2023-02-05 LAB — CBC WITH DIFFERENTIAL (CANCER CENTER ONLY)
Abs Immature Granulocytes: 0.03 10*3/uL (ref 0.00–0.07)
Basophils Absolute: 0.1 10*3/uL (ref 0.0–0.1)
Basophils Relative: 1 %
Eosinophils Absolute: 0.2 10*3/uL (ref 0.0–0.5)
Eosinophils Relative: 4 %
HCT: 40.6 % (ref 36.0–46.0)
Hemoglobin: 13 g/dL (ref 12.0–15.0)
Immature Granulocytes: 1 %
Lymphocytes Relative: 28 %
Lymphs Abs: 1.6 10*3/uL (ref 0.7–4.0)
MCH: 31.1 pg (ref 26.0–34.0)
MCHC: 32 g/dL (ref 30.0–36.0)
MCV: 97.1 fL (ref 80.0–100.0)
Monocytes Absolute: 0.5 10*3/uL (ref 0.1–1.0)
Monocytes Relative: 10 %
Neutro Abs: 3.2 10*3/uL (ref 1.7–7.7)
Neutrophils Relative %: 56 %
Platelet Count: 292 10*3/uL (ref 150–400)
RBC: 4.18 MIL/uL (ref 3.87–5.11)
RDW: 14.1 % (ref 11.5–15.5)
WBC Count: 5.6 10*3/uL (ref 4.0–10.5)
nRBC: 0 % (ref 0.0–0.2)

## 2023-02-05 LAB — CMP (CANCER CENTER ONLY)
ALT: 15 U/L (ref 0–44)
AST: 23 U/L (ref 15–41)
Albumin: 3.5 g/dL (ref 3.5–5.0)
Alkaline Phosphatase: 43 U/L (ref 38–126)
Anion gap: 9 (ref 5–15)
BUN: 18 mg/dL (ref 8–23)
CO2: 27 mmol/L (ref 22–32)
Calcium: 9.2 mg/dL (ref 8.9–10.3)
Chloride: 103 mmol/L (ref 98–111)
Creatinine: 0.97 mg/dL (ref 0.44–1.00)
GFR, Estimated: 58 mL/min — ABNORMAL LOW (ref >60)
Glucose, Bld: 108 mg/dL — ABNORMAL HIGH (ref 70–99)
Potassium: 3.7 mmol/L (ref 3.5–5.1)
Sodium: 139 mmol/L (ref 135–145)
Total Bilirubin: 0.5 mg/dL (ref 0.3–1.2)
Total Protein: 6.8 g/dL (ref 6.5–8.1)

## 2023-02-05 NOTE — Progress Notes (Signed)
Referred patient to [employer] for co-pay assistance for her Carrie Wilkerson.

## 2023-02-07 ENCOUNTER — Inpatient Hospital Stay: Payer: Medicare HMO

## 2023-02-07 VITALS — BP 108/54 | HR 70 | Temp 97.5°F | Resp 16 | Ht 62.0 in

## 2023-02-07 DIAGNOSIS — C7951 Secondary malignant neoplasm of bone: Secondary | ICD-10-CM | POA: Diagnosis not present

## 2023-02-07 DIAGNOSIS — Z79620 Long term (current) use of immunosuppressive biologic: Secondary | ICD-10-CM | POA: Diagnosis not present

## 2023-02-07 DIAGNOSIS — C50412 Malignant neoplasm of upper-outer quadrant of left female breast: Secondary | ICD-10-CM | POA: Diagnosis not present

## 2023-02-07 DIAGNOSIS — C787 Secondary malignant neoplasm of liver and intrahepatic bile duct: Secondary | ICD-10-CM | POA: Diagnosis not present

## 2023-02-07 DIAGNOSIS — Z170 Estrogen receptor positive status [ER+]: Secondary | ICD-10-CM | POA: Diagnosis not present

## 2023-02-07 DIAGNOSIS — Z79899 Other long term (current) drug therapy: Secondary | ICD-10-CM | POA: Diagnosis not present

## 2023-02-07 MED ORDER — DENOSUMAB 120 MG/1.7ML ~~LOC~~ SOLN
120.0000 mg | Freq: Once | SUBCUTANEOUS | Status: AC
  Administered 2023-02-07: 120 mg via SUBCUTANEOUS
  Filled 2023-02-07: qty 1.7

## 2023-02-12 NOTE — Progress Notes (Signed)
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, bone and lung/pleura  Current Treatment: Elacestrant oral chemotherapy with monthly Xgeva  HISTORY OF PRESENT ILLNESS:   Oncology History  Malignant neoplasm of upper-outer quadrant of left female breast (HCC)  03/07/2006 Initial Diagnosis   Breast cancer, left breast (HCC)   03/17/2006 Cancer Staging   Staging form: Breast, AJCC 6th Edition - Clinical stage from 03/17/2006: Stage IIIA (T1c, N2a, M0) - Signed by Dellia Beckwith, MD on 12/15/2020 Staged by: Managing physician Diagnostic confirmation: Positive histology Specimen type: Excision Histopathologic type: Infiltrating duct carcinoma, NOS Tumor size (mm): 15 Laterality: Left Total positive nodes: 5 Histologic grade (G): G1 Residual tumor (R): R0 - None Stage prefix: Initial diagnosis Lymphatic vessel invasion (L): L0 - No lymphatic vessel invasion Venous invasion (V): V0 - No venous invasion Prognostic indicators: ER/PR pos, HER 2 neg., Rx with AC x 3 mo, then Taxol x 3 mo.,AI x 10 yrs      INTERVAL HISTORY:  Carrie Wilkerson is here today for repeat clinical assessment of recurrent hormone receptor positive 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 She has been on Orserdu for two months and is tolerating it with no difficulties. She did state that after her last appointment on 4/3 that she did have two falls that week, one two days after the other. Both falls resulted in a bump on her head. She has not had any bleeding, dizziness, headaches, etc. She stated the first fall happened when she bent over to get something off the floor and the second one happened when she went to sit on a  stool. When she came in today, the bumps were pretty much gone and she stated she has been feeling fine since and we discussed trying to be more careful with the falls. Patient agreed. Discussed with MD and we will continue to monitor patient.   LABS:      Latest Ref Rng & Units 02/05/2023    2:01 PM 01/08/2023    1:56 PM 12/25/2022    2:06 PM  CBC  WBC 4.0 - 10.5 K/uL 5.6  6.3  6.3   Hemoglobin 12.0 - 15.0 g/dL 91.4  78.2  95.6   Hematocrit 36.0 - 46.0 % 40.6  40.3  42.1   Platelets 150 - 400 K/uL 292  271  228       Latest Ref Rng & Units 02/05/2023    2:01 PM 01/08/2023    1:56 PM 12/25/2022    2:06 PM  CMP  Glucose 70 - 99 mg/dL 213  086  578   BUN 8 - 23 mg/dL 18  16  15    Creatinine 0.44 - 1.00 mg/dL 4.69  6.29  5.28   Sodium 135 - 145 mmol/L 139  139  141   Potassium 3.5 - 5.1 mmol/L 3.7  3.9  3.7   Chloride 98 - 111 mmol/L 103  106  105   CO2 22 - 32 mmol/L 27  25  27    Calcium 8.9 - 10.3 mg/dL 9.2  9.1  9.1   Total Protein 6.5 - 8.1 g/dL 6.8  6.9  6.9   Total Bilirubin 0.3 - 1.2 mg/dL 0.5  0.4  0.5  Alkaline Phos 38 - 126 U/L 43  40  44   AST 15 - 41 U/L 23  22  24    ALT 0 - 44 U/L 15  15  14     Component Ref Range & Units 11/18/2022  INR 0.80 - 1.20 1.00     Lab Results  Component Value Date   CEA1 1.6 10/11/2021   /  CEA  Date Value Ref Range Status  10/11/2021 1.6 0.0 - 4.7 ng/mL Final    Comment:    (NOTE)                             Nonsmokers          <3.9                             Smokers             <5.6 [employer] Electrochemiluminescence Immunoassay (ECLIA) Values obtained with different assay methods or kits cannot be used interchangeably.  Results cannot be interpreted as absolute evidence of the presence or absence of malignant disease. Performed At: [facility] [address] 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  Report date is 11/29/21.   The CustomNext-Cancer+RNAinsight panel offered by [employer] includes sequencing and rearrangement analysis for the following 47 genes:  APC, ATM, AXIN2, BARD1, BMPR1A, BRCA1, BRCA2, BRIP1, CDH1, CDK4, CDKN2A, CHEK2, DICER1, EPCAM, GREM1, HOXB13,    History of left breast cancer 06/13/2021   Hypotension 06/13/2021   Incisional hernia, without obstruction or gangrene 10/22/2016   Malignant neoplasm of upper-outer quadrant of left female breast (HCC) 03/07/2006   Mixed dyslipidemia 11/03/2018   Numbness in right leg    Hx: of   Obesity (BMI 30.0-34.9) 09/21/2020   Other specified disorders of bone density and structure, multiple sites 09/20/2020   Paroxysmal atrial fibrillation (HCC) 02/13/2016   Personal history of COVID-19 09/2019   Pleural effusion, 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   Pneumonia    Hx: of 'a long time ago"   Postoperative examination 02/05/2016    Past Surgical History:  Procedure Laterality Date   APPENDECTOMY     BACK SURGERY     BREAST SURGERY     Hx: of lumpectomy left breast 2007   COLONOSCOPY     Hx: of   DILATION AND CURETTAGE OF UTERUS      TONSILLECTOMY     TOTAL HIP ARTHROPLASTY Right 04/13/2013   Dr Magnus Ivan   TOTAL HIP ARTHROPLASTY Right 04/13/2013  Procedure: RIGHT TOTAL HIP ARTHROPLASTY ANTERIOR APPROACH;  Surgeon: Kathryne Hitch, MD;  Location: [facility] OR;  Service: Orthopedics;  Laterality: Right;   TUBAL LIGATION      Family History  Problem Relation Age of Onset   Hypertension Sister    Cancer Sister 5       uterine or ovarian   Bone cancer Sister 54   Breast cancer Sister        dx 59 and 48   Uterine cancer Sister 29   Colon cancer Sister 41   Breast cancer Sister 72   Hypertension Brother    Kidney cancer Brother 60   Cancer Brother 42       unknown type; mets   Liver cancer Brother    Liver cancer Brother 57   Pancreatic cancer Niece 24   Breast cancer Niece 70   Colon cancer Nephew 57       mets    Social History:  reports that she has never smoked. She has never used smokeless tobacco. She reports that she does not drink alcohol and does not use drugs.The patient is alone today.  Allergies:  Allergies  Allergen Reactions   Codeine     unknown    Sulfa Antibiotics     unknown   Sulfacetamide Sodium Other (See Comments)    unknown   Chlorhexidine Rash    Current Medications: Current Outpatient Medications  Medication Sig Dispense Refill   apixaban (ELIQUIS) 5 MG TABS tablet Take 1 tablet (5 mg total) by mouth 2 (two) times daily. 180 tablet 3   atorvastatin (LIPITOR) 10 MG tablet Take 1 tablet by mouth every other day.  4   benzonatate (TESSALON) 100 MG capsule Take 1 capsule (100 mg total) by mouth 3 (three) times daily as needed for cough. 20 capsule 5   calcium carbonate (OSCAL) 1500 (600 Ca) MG TABS tablet Take 600 mg of elemental calcium by mouth daily with breakfast.     cholecalciferol (VITAMIN D) 1000 UNITS tablet Take 1,000 Units by mouth daily.     denosumab (PROLIA) 60 MG/ML SOLN injection Inject 60 mg into the skin every 6 (six) months. Administer in upper arm, thigh,  or abdomen     elacestrant hydrochloride (ORSERDU) 345 MG tablet Take 1 tablet (345 mg total) by mouth daily. Take with food. 30 tablet 5   Flaxseed, Linseed, OIL Take 1 capsule by mouth daily.     glucosamine-chondroitin 500-400 MG tablet Take 1 tablet by mouth 2 (two) times daily.      levothyroxine (SYNTHROID, LEVOTHROID) 75 MCG tablet Take 75 mcg by mouth daily.   7   metoprolol tartrate (LOPRESSOR) 25 MG tablet Take 1 tablet (25 mg total) by mouth 2 (two) times daily. 180 tablet 0   Multiple Vitamin (MULTIVITAMIN WITH MINERALS) TABS Take 1 tablet by mouth daily.     ondansetron (ZOFRAN) 4 MG tablet Take 1 tablet (4 mg total) by mouth every 4 (four) hours as needed for nausea or vomiting. 30 tablet 5   prochlorperazine (COMPAZINE) 10 MG tablet Take 1 tablet (10 mg total) by mouth every 6 (six) hours as needed for nausea or vomiting. 30 tablet 5   No current facility-administered medications for this visit.    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 CT chest in December revealed right middle and lower lobe collapse  with large right pleural effusion. PET was negative.  She required thoracentesis on 2 occasions, the last in January.  Cytology revealed malignant cells consistent with breast primary, ER/PR positive.  She was started on anastrozole 1 mg daily on January 5.  Oral chemotherapy with palbociclib 125 mg was added in February.  After 1 cycle, she developed severe neutropenia. Her 2nd cycle was postponed 1 week.  Prior to a 3rd cycle, she had recurrent neutropenia, so her chemotherapy was held for 2 weeks and the palbociclib dose reduced to 100 mg daily.  She had persistent neutropenia and the dose was decreased to 75mg  daily.  CT chest, abdomen and pelvis on July 24 revealed a decrease in the large right pleural effusion and  compressive right lung atelectasis. Now her scan in January, 2024 reveals a new nodule in  the right middle lobe measuring 7mm. But more concerning are the new lesions in the liver as of February 1st, 2024. She still has a large right pleural effusion that remains stable.  Right Lung Mass She has a hypermetabolic right infrahilar mass measuring 2.4cm with an SUV 16.87. This was found to be metastatic breast cancer. She has never smoked. She had the ESR1 mutation and was started on Orserdu.   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 I think these are likely metastatic but I recommend a PET to be sure. At the same time this will also evaluate for any new metastatic disease. I think she is strong enough and willing to pursue further treatment and we discussed a few options.  We therefore obtained a liver biopsy to confirm the diagnosis and this is positive for metastatic breast cancer, ER positive and HER2 negative.    Malignant neoplasm of upper-outer quadrant of left female breast [facility]) Remote history of stage IIIA hormone receptor positive breast cancer diagnosed in June 2007, treated with surgery, chemotherapy, radiation and 10 years of adjuvant endocrine therapy.  She now has recurrent disease.   Malignant Neoplasm Metastatic to Bone On PET scans she has multiple bone lesions including the right 11th rib, left iliac bone, and left sacrum. She also has metastases noted in muscle tissue, bilateral paraspinal musculature. She is now on Xgeva every 4 weeks.   Other specified disorders of bone density and structure, multiple sites Osteopenia, for which she has been receiving denosumab every 6 months, next due in September.  She continues calcium and vitamin-D as recommended.  She had a repeat bone density in December, 2023, which is stable.  Her last dose was given in  September. She will now change to monthly injections due to her bone metastases.    Lymphedema This is a relatively new finding of her left upper extremity and 1+ at this time.  I gave her some literature about the diagnosis of lymphedema.  I find no adenopathy or tumor of the axilla.     Plan  She has been on Orserdu for two months and is tolerating it with no difficulties. She is due for denosumab on 02/07/2023.  She will continue with monthly appointments with CBC and CMP and the next appointment is on 5/29. The patient understands the plans discussed today and is in agreement with them.  I reviewed all of this information with her today and answered her questions.  She knows to contact our office if she develops concerns  prior to her next appointment.  I provided 30 minutes of face-to-face time during this encounter and > 50% was spent counseling as documented under my assessment and plan.    Bethel Born, PharmD Hematology/Oncology Clinical Pharmacist [facility] [phone]

## 2023-02-28 NOTE — Progress Notes (Signed)
[facility]  [address] 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 CT chest in December revealed right middle and lower lobe collapse with large right pleural effusion. PET was negative.  She required thoracentesis on 2 occasions, the last in January.  Cytology revealed malignant cells consistent with breast primary, ER/PR positive.  She was started on anastrozole 1 mg daily on January 5.  Oral chemotherapy with palbociclib 125 mg was added in February.  After 1 cycle, she developed severe neutropenia. Her 2nd cycle was postponed 1 week.  Prior to a 3rd cycle, she had recurrent neutropenia, so her chemotherapy was held for 2 weeks and the palbociclib dose reduced to 100 mg daily.  She had persistent neutropenia and the dose was decreased to 75mg  daily.  CT chest, abdomen and pelvis on July 24 revealed a decrease in the large right pleural effusion and compressive right lung atelectasis. Now her scan in January, 2024 reveals a new nodule in  the right middle lobe measuring 7mm. But more concerning are the new lesions in the liver as of February 1st, 2024. She still has a large right pleural effusion that remains stable.  Right Lung Mass She has a hypermetabolic right infrahilar mass measuring 2.4cm with an SUV 16.87 found in February, 2024. This was found to be metastatic breast cancer. She has never smoked. 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 I think these are likely metastatic but I recommend a PET to be sure. At the same time this will also evaluate for any new metastatic disease. I think she is strong enough and willing to pursue further treatment and we discussed a few options.  We therefore obtained a liver biopsy to confirm the diagnosis and this is positive for metastatic breast cancer, ER positive and HER2 negative. 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  We will plan repeat scans next month.   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  She had recurrent disease found in December 2022.   Malignant Neoplasm Metastatic to Bone On PET scans she has multiple bone lesions including the right 11th rib, left iliac bone, and left sacrum. She also has metastases noted in muscle tissue, bilateral paraspinal musculature. She is now on Xgeva every 4 weeks.   Osteopenia Osteopenia, for which she has been receiving denosumab every 6 months, next due in September.  She continues calcium and vitamin-D as recommended.  She had a repeat bone density in December, 2023, which is stable.  Her last dose was given in September. She will now change to monthly injections due to her bone metastases.    Lymphedema This is a relatively new finding of her left upper extremity and 1+ at this time.  I gave her some literature  about the diagnosis of lymphedema.  I find no adenopathy or tumor of the axilla.     Plan  She continues Orserdu without difficulty and is now completing 3 months. She will receive her Xgeva injection on  03/07/2023. She will have repeat scans in June. Her labs today are pending. I will see her back in 1 month with CBC, CMP, and CT chest, abdomen, and pelvis to be done 2 days  prior to her appointment with me.  The patient understands the plans discussed today and is in agreement with them.  I reviewed all of this information with her today and answered her questions.  She knows to contact our office if she develops concerns prior to her next appointment.   I provided 23 minutes of face-to-face time during this encounter and > 50% was spent counseling as documented under my assessment and plan.      Dellia Beckwith, MD  [facility] [address] 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, bone and lung/pleura  Current Treatment: Elacestrant oral chemotherapy with monthly Xgeva  HISTORY OF PRESENT ILLNESS:   Oncology History  Malignant neoplasm of upper-outer quadrant of left female breast (HCC)  03/07/2006 Initial Diagnosis   Breast cancer, left breast (HCC)   03/17/2006 Cancer Staging   Staging form: Breast, AJCC 6th Edition - Clinical stage from 03/17/2006: Stage IIIA (T1c, N2a, M0) - Signed by Dellia Beckwith, MD on 12/15/2020 Staged by: Managing physician Diagnostic confirmation: Positive histology Specimen type: Excision Histopathologic type: Infiltrating duct carcinoma, NOS Tumor size (mm): 15 Laterality: Left Total positive nodes: 5 Histologic grade (G): G1 Residual tumor (R): R0 - None Stage prefix: Initial diagnosis Lymphatic vessel invasion (L): L0 - No lymphatic vessel invasion Venous invasion (V): V0 - No venous invasion Prognostic indicators: ER/PR pos, HER 2 neg., Rx with AC x 3 mo, then Taxol x 3 mo.,AI x 10 yrs       INTERVAL HISTORY:  Carrie Wilkerson is here today for repeat clinical assessment of recurrent hormone receptor positive breast cancer metastatic to liver, bone and  lung/pleura. 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 Patient states that she feels well and has no complaints of pain. She continues Orserdu without difficulty and is now completing 3 months. She will receive her Xgeva injections on  03/07/2023. She will have repeat scans in June. Her labs today are pending. I will see her back in 1 month with CBC, CMP, and CT chest, abdomen, and pelvis 2 days prior to her appointment with me. She denies signs of infection such as sore throat, sinus drainage, cough, or urinary symptoms.  She denies fevers or recurrent chills. She denies pain. She denies nausea, vomiting, chest pain, dyspnea or cough. Her appetite is good and her weight has increased 2 pounds over last month .  REVIEW OF SYSTEMS:  Review of Systems  Constitutional: Negative.  Negative for appetite change, chills, diaphoresis, fatigue, fever and unexpected weight change.  HENT:  Negative.  Negative for hearing loss, lump/mass, mouth sores, nosebleeds, sore throat, tinnitus, trouble swallowing and voice change.   Eyes: Negative.  Negative for eye problems and icterus.  Respiratory: Negative.  Negative for chest tightness, cough, hemoptysis, shortness of breath  and wheezing.   Cardiovascular: Negative.  Negative for chest pain, leg swelling and palpitations.  Gastrointestinal: Negative.  Negative for abdominal distention, abdominal pain, blood in stool, constipation, diarrhea, nausea, rectal pain and vomiting.  Endocrine: Negative.  Negative for hot flashes.  Genitourinary:  Negative for bladder incontinence, difficulty urinating, dyspareunia, dysuria, frequency, hematuria, menstrual problem, nocturia, pelvic pain, vaginal bleeding and vaginal discharge.   Musculoskeletal:  Positive for arthralgias (chronic knee pain). Negative for back pain, flank pain, gait problem, myalgias, neck pain and neck stiffness.  Skin: Negative.  Negative for itching, rash and wound.  Neurological:  Negative.  Negative for dizziness, extremity weakness, gait problem, headaches, light-headedness, numbness, seizures and speech difficulty.  Hematological: Negative.  Negative for adenopathy. Does not bruise/bleed easily.  Psychiatric/Behavioral: Negative.  Negative for confusion, decreased concentration, depression, sleep disturbance and suicidal ideas. The patient is not nervous/anxious.   All other systems reviewed and are negative.    VITALS:  Blood pressure (!) 149/70, pulse 66, temperature 97.6 F (36.4 C), temperature source Oral, resp. rate 18, height 5\' 2"  (1.575 m), weight 157 lb 11.2 oz (71.5 kg), SpO2 96 %.  Wt Readings from Last 3 Encounters:  03/07/23 156 lb 1.9 oz (70.8 kg)  03/05/23 157 lb 11.2 oz (71.5 kg)  01/08/23 155 lb 6.4 oz (70.5 kg)    Body mass index is 28.84 kg/m.  Performance status (ECOG): 0 - Asymptomatic  PHYSICAL EXAM:  Physical Exam Vitals and nursing note reviewed. Exam conducted with a chaperone present.  Constitutional:      General: She is not in acute distress.    Appearance: Normal appearance. She is normal weight. She is not ill-appearing, toxic-appearing or diaphoretic.  HENT:     Head: Normocephalic and atraumatic.     Right Ear: Tympanic membrane, ear canal and external ear normal. There is no impacted cerumen.     Left Ear: Tympanic membrane, ear canal and external ear normal. There is no impacted cerumen.     Nose: Nose normal. No congestion or rhinorrhea.     Mouth/Throat:     Mouth: Mucous membranes are moist.     Pharynx: Oropharynx is clear. No oropharyngeal exudate or posterior oropharyngeal erythema.  Eyes:     General: No scleral icterus.       Right eye: No discharge.        Left eye: No discharge.     Extraocular Movements: Extraocular movements intact.     Conjunctiva/sclera: Conjunctivae normal.     Pupils: Pupils are equal, round, and reactive to light.  Neck:     Vascular: No carotid bruit.  Cardiovascular:     Rate  and Rhythm: Normal rate and regular rhythm.     Pulses: Normal pulses.     Heart sounds: Normal heart sounds. No murmur heard.    No friction rub. No gallop.  Pulmonary:     Effort: Pulmonary effort is normal. No respiratory distress.     Breath sounds: No stridor. Examination of the right-lower field reveals decreased breath sounds. Decreased breath sounds present. No wheezing, rhonchi or rales.  Chest:     Chest wall: No tenderness.  Abdominal:     General: Bowel sounds are normal. There is no distension.     Palpations: Abdomen is soft. There is no hepatomegaly, splenomegaly or mass.     Tenderness: There is no abdominal tenderness. There is no right CVA tenderness, left CVA tenderness, guarding or rebound.     Hernia: A hernia  is present.     Comments: Chronic upper abdominal ventral wall hernia.   Musculoskeletal:        General: No swelling, tenderness, deformity or signs of injury. Normal range of motion.     Cervical back: Normal range of motion and neck supple. No rigidity or tenderness.     Right lower leg: No edema.     Left lower leg: No edema.     Comments: 1+ lymphedema of the left upper extremity    Lymphadenopathy:     Cervical: No cervical adenopathy.     Right cervical: No superficial, deep or posterior cervical adenopathy.    Left cervical: No superficial, deep or posterior cervical adenopathy.     Upper Body:     Right upper body: No supraclavicular, axillary or pectoral adenopathy.     Left upper body: No supraclavicular, axillary or pectoral adenopathy.  Skin:    General: Skin is warm and dry.     Coloration: Skin is not jaundiced or pale.     Findings: No bruising, erythema, lesion or rash.  Neurological:     General: No focal deficit present.     Mental Status: She is alert and oriented to person, place, and time. Mental status is at baseline.     Cranial Nerves: No cranial nerve deficit.     Sensory: No sensory deficit.     Motor: No weakness.      Coordination: Coordination normal.     Gait: Gait normal.     Deep Tendon Reflexes: Reflexes normal.  Psychiatric:        Mood and Affect: Mood normal.        Behavior: Behavior normal.        Thought Content: Thought content normal.        Judgment: Judgment normal.    LABS:      Latest Ref Rng & Units 03/05/2023   10:58 AM 02/05/2023    2:01 PM 01/08/2023    1:56 PM  CBC  WBC 4.0 - 10.5 K/uL 5.0  5.6  6.3   Hemoglobin 12.0 - 15.0 g/dL 16.1  09.6  04.5   Hematocrit 36.0 - 46.0 % 40.2  40.6  40.3   Platelets 150 - 400 K/uL 259  292  271       Latest Ref Rng & Units 03/05/2023   10:58 AM 02/05/2023    2:01 PM 01/08/2023    1:56 PM  CMP  Glucose 70 - 99 mg/dL 90  409  811   BUN 8 - 23 mg/dL 17  18  16    Creatinine 0.44 - 1.00 mg/dL 9.14  7.82  9.56   Sodium 135 - 145 mmol/L 138  139  139   Potassium 3.5 - 5.1 mmol/L 4.1  3.7  3.9   Chloride 98 - 111 mmol/L 105  103  106   CO2 22 - 32 mmol/L 24  27  25    Calcium 8.9 - 10.3 mg/dL 9.2  9.2  9.1   Total Protein 6.5 - 8.1 g/dL 6.9  6.8  6.9   Total Bilirubin 0.3 - 1.2 mg/dL 0.8  0.5  0.4   Alkaline Phos 38 - 126 U/L 38  43  40   AST 15 - 41 U/L 22  23  22    ALT 0 - 44 U/L 12  15  15     Component Ref Range & Units 11/18/2022  INR 0.80 - 1.20 1.00     Lab  Results  Component Value Date   CEA1 1.6 10/11/2021   /  CEA  Date Value Ref Range Status  10/11/2021 1.6 0.0 - 4.7 ng/mL Final    Comment:    (NOTE)                             Nonsmokers          <3.9                             Smokers             <5.6 [employer] Electrochemiluminescence Immunoassay (ECLIA) Values obtained with different assay methods or kits cannot be used interchangeably.  Results cannot be interpreted as absolute evidence of the presence or absence of malignant disease. Performed At: [facility] [address] [place], [place] [id number] Jolene Schimke MD WU:[id number]    No results found for: "PSA1" No results found for:  "[id number]" No results found for: "CAN125"  No results found for: "TOTALPROTELP", "ALBUMINELP", "A1GS", "A2GS", "BETS", "BETA2SER", "GAMS", "MSPIKE", "SPEI" No results found for: "TIBC", "FERRITIN", "IRONPCTSAT" No results found for: "LDH"  STUDIES:      Exam: 11/07/2022 Nuclear Medicine PET skull Base to Thigh Impression: Widespread metastatic disease. Findings could be due to metastatic breast cancer or lung cancer. There is a sizable right lower lobe/right infrahilar mass which is more suggestive of a recurrent lung cancer with associated mediastinal and hilar adenopathy. Metastatic pulmonary and pleural nodules, metastatic hepatic and osseous lesions and muscle metastasis. Large right pleural effusion is likely malignant.  Other incidental findings as detailed.         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  Report date is 11/29/21.   The CustomNext-Cancer+RNAinsight panel offered by [employer] includes sequencing and rearrangement analysis for the following 47 genes:  APC, ATM, AXIN2, BARD1, BMPR1A, BRCA1, BRCA2, BRIP1, CDH1, CDK4, CDKN2A, CHEK2, DICER1, EPCAM, GREM1, HOXB13,    History of left breast cancer 06/13/2021   Hypotension 06/13/2021   Incisional hernia, without obstruction or gangrene 10/22/2016   Malignant neoplasm of upper-outer quadrant of left female breast (HCC) 03/07/2006   Mixed dyslipidemia  11/03/2018   Numbness in right leg    Hx: of   Obesity (BMI 30.0-34.9) 09/21/2020   Other specified disorders of bone density and structure, multiple sites 09/20/2020   Paroxysmal atrial fibrillation (HCC) 02/13/2016   Personal history of COVID-19 09/2019   Pleural effusion, 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   Pneumonia    Hx: of 'a long time ago"   Postoperative examination 02/05/2016    Past Surgical History:  Procedure Laterality Date   APPENDECTOMY     BACK SURGERY     BREAST SURGERY     Hx: of lumpectomy left breast 2007   COLONOSCOPY     Hx: of  DILATION AND CURETTAGE OF UTERUS     TONSILLECTOMY     TOTAL HIP ARTHROPLASTY Right 04/13/2013   Dr Magnus Ivan   TOTAL HIP ARTHROPLASTY Right 04/13/2013   Procedure: RIGHT TOTAL HIP ARTHROPLASTY ANTERIOR APPROACH;  Surgeon: Kathryne Hitch, MD;  Location: [facility] OR;  Service: Orthopedics;  Laterality: Right;   TUBAL LIGATION      Family History  Problem Relation Age of Onset   Hypertension Sister    Cancer Sister 74       uterine or ovarian   Bone cancer Sister 39   Breast cancer Sister        dx 6 and 17   Uterine cancer Sister 98   Colon cancer Sister 97   Breast cancer Sister 13   Hypertension Brother    Kidney cancer Brother 39   Cancer Brother 50       unknown type; mets   Liver cancer Brother    Liver cancer Brother 99   Pancreatic cancer Niece 41   Breast cancer Niece 26   Colon cancer Nephew 57       mets    Social History:  reports that she has never smoked. She has never used smokeless tobacco. She reports that she does not drink alcohol and does not use drugs.The patient is alone today.  Allergies:  Allergies  Allergen Reactions   Codeine     unknown    Sulfa Antibiotics     unknown   Sulfacetamide Sodium Other (See Comments)    unknown   Chlorhexidine Rash    Current Medications: Current Outpatient Medications  Medication Sig Dispense Refill   apixaban (ELIQUIS) 5 MG TABS tablet Take 1 tablet (5 mg  total) by mouth 2 (two) times daily. 180 tablet 3   atorvastatin (LIPITOR) 10 MG tablet Take 1 tablet by mouth every other day.  4   benzonatate (TESSALON) 100 MG capsule Take 1 capsule (100 mg total) by mouth 3 (three) times daily as needed for cough. 20 capsule 5   calcium carbonate (OSCAL) 1500 (600 Ca) MG TABS tablet Take 600 mg of elemental calcium by mouth daily with breakfast.     cholecalciferol (VITAMIN D) 1000 UNITS tablet Take 1,000 Units by mouth daily.     denosumab (PROLIA) 60 MG/ML SOLN injection Inject 60 mg into the skin every 6 (six) months. Administer in upper arm, thigh, or abdomen     elacestrant hydrochloride (ORSERDU) 345 MG tablet Take 1 tablet (345 mg total) by mouth daily. Take with food. 30 tablet 5   Flaxseed, Linseed, OIL Take 1 capsule by mouth daily.     glucosamine-chondroitin 500-400 MG tablet Take 1 tablet by mouth 2 (two) times daily.      levothyroxine (SYNTHROID, LEVOTHROID) 75 MCG tablet Take 75 mcg by mouth daily.   7   metoprolol tartrate (LOPRESSOR) 25 MG tablet Take 1 tablet (25 mg total) by mouth 2 (two) times daily. 180 tablet 0   Multiple Vitamin (MULTIVITAMIN WITH MINERALS) TABS Take 1 tablet by mouth daily.     ondansetron (ZOFRAN) 4 MG tablet Take 1 tablet (4 mg total) by mouth every 4 (four) hours as needed for nausea or vomiting. 30 tablet 5   prochlorperazine (COMPAZINE) 10 MG tablet Take 1 tablet (10 mg total) by mouth every 6 (six) hours as needed for nausea or vomiting. 30 tablet 5   No current facility-administered medications for this visit.    I,Jasmine M Lassiter,acting as a Neurosurgeon for  Dellia Beckwith, MD.,have documented all relevant documentation on the behalf of Dellia Beckwith, MD,as directed by  Dellia Beckwith, MD while in the presence of Dellia Beckwith, MD.

## 2023-03-04 DIAGNOSIS — C50412 Malignant neoplasm of upper-outer quadrant of left female breast: Secondary | ICD-10-CM | POA: Diagnosis not present

## 2023-03-04 DIAGNOSIS — E785 Hyperlipidemia, unspecified: Secondary | ICD-10-CM | POA: Diagnosis not present

## 2023-03-04 DIAGNOSIS — I480 Paroxysmal atrial fibrillation: Secondary | ICD-10-CM | POA: Diagnosis not present

## 2023-03-04 DIAGNOSIS — G620 Drug-induced polyneuropathy: Secondary | ICD-10-CM | POA: Diagnosis not present

## 2023-03-04 DIAGNOSIS — Z170 Estrogen receptor positive status [ER+]: Secondary | ICD-10-CM | POA: Diagnosis not present

## 2023-03-04 DIAGNOSIS — C787 Secondary malignant neoplasm of liver and intrahepatic bile duct: Secondary | ICD-10-CM | POA: Diagnosis not present

## 2023-03-04 DIAGNOSIS — D6869 Other thrombophilia: Secondary | ICD-10-CM | POA: Diagnosis not present

## 2023-03-04 DIAGNOSIS — I10 Essential (primary) hypertension: Secondary | ICD-10-CM | POA: Diagnosis not present

## 2023-03-04 DIAGNOSIS — C7951 Secondary malignant neoplasm of bone: Secondary | ICD-10-CM | POA: Diagnosis not present

## 2023-03-04 DIAGNOSIS — Z6829 Body mass index (BMI) 29.0-29.9, adult: Secondary | ICD-10-CM | POA: Diagnosis not present

## 2023-03-04 DIAGNOSIS — E039 Hypothyroidism, unspecified: Secondary | ICD-10-CM | POA: Diagnosis not present

## 2023-03-05 ENCOUNTER — Inpatient Hospital Stay: Payer: Medicare HMO | Admitting: Oncology

## 2023-03-05 ENCOUNTER — Encounter: Payer: Self-pay | Admitting: Oncology

## 2023-03-05 ENCOUNTER — Other Ambulatory Visit: Payer: Self-pay | Admitting: Oncology

## 2023-03-05 ENCOUNTER — Other Ambulatory Visit (HOSPITAL_COMMUNITY): Payer: Self-pay

## 2023-03-05 ENCOUNTER — Inpatient Hospital Stay: Payer: Medicare HMO

## 2023-03-05 VITALS — BP 149/70 | HR 66 | Temp 97.6°F | Resp 18 | Ht 62.0 in | Wt 157.7 lb

## 2023-03-05 DIAGNOSIS — Z170 Estrogen receptor positive status [ER+]: Secondary | ICD-10-CM

## 2023-03-05 DIAGNOSIS — E782 Mixed hyperlipidemia: Secondary | ICD-10-CM

## 2023-03-05 DIAGNOSIS — C787 Secondary malignant neoplasm of liver and intrahepatic bile duct: Secondary | ICD-10-CM

## 2023-03-05 DIAGNOSIS — M1990 Unspecified osteoarthritis, unspecified site: Secondary | ICD-10-CM | POA: Diagnosis not present

## 2023-03-05 DIAGNOSIS — C50412 Malignant neoplasm of upper-outer quadrant of left female breast: Secondary | ICD-10-CM | POA: Diagnosis not present

## 2023-03-05 DIAGNOSIS — E039 Hypothyroidism, unspecified: Secondary | ICD-10-CM | POA: Diagnosis not present

## 2023-03-05 DIAGNOSIS — C7951 Secondary malignant neoplasm of bone: Secondary | ICD-10-CM | POA: Diagnosis not present

## 2023-03-05 DIAGNOSIS — Z79620 Long term (current) use of immunosuppressive biologic: Secondary | ICD-10-CM | POA: Diagnosis not present

## 2023-03-05 DIAGNOSIS — Z79899 Other long term (current) drug therapy: Secondary | ICD-10-CM | POA: Diagnosis not present

## 2023-03-05 LAB — CBC WITH DIFFERENTIAL (CANCER CENTER ONLY)
Abs Immature Granulocytes: 0.02 10*3/uL (ref 0.00–0.07)
Basophils Absolute: 0.1 10*3/uL (ref 0.0–0.1)
Basophils Relative: 1 %
Eosinophils Absolute: 0.1 10*3/uL (ref 0.0–0.5)
Eosinophils Relative: 1 %
HCT: 40.2 % (ref 36.0–46.0)
Hemoglobin: 12.9 g/dL (ref 12.0–15.0)
Immature Granulocytes: 0 %
Lymphocytes Relative: 25 %
Lymphs Abs: 1.3 10*3/uL (ref 0.7–4.0)
MCH: 30.1 pg (ref 26.0–34.0)
MCHC: 32.1 g/dL (ref 30.0–36.0)
MCV: 93.9 fL (ref 80.0–100.0)
Monocytes Absolute: 0.6 10*3/uL (ref 0.1–1.0)
Monocytes Relative: 12 %
Neutro Abs: 3 10*3/uL (ref 1.7–7.7)
Neutrophils Relative %: 61 %
Platelet Count: 259 10*3/uL (ref 150–400)
RBC: 4.28 MIL/uL (ref 3.87–5.11)
RDW: 14.5 % (ref 11.5–15.5)
WBC Count: 5 10*3/uL (ref 4.0–10.5)
nRBC: 0 % (ref 0.0–0.2)

## 2023-03-05 LAB — CMP (CANCER CENTER ONLY)
ALT: 12 U/L (ref 0–44)
AST: 22 U/L (ref 15–41)
Albumin: 3.5 g/dL (ref 3.5–5.0)
Alkaline Phosphatase: 38 U/L (ref 38–126)
Anion gap: 9 (ref 5–15)
BUN: 17 mg/dL (ref 8–23)
CO2: 24 mmol/L (ref 22–32)
Calcium: 9.2 mg/dL (ref 8.9–10.3)
Chloride: 105 mmol/L (ref 98–111)
Creatinine: 0.83 mg/dL (ref 0.44–1.00)
GFR, Estimated: >60 mL/min (ref >60)
Glucose, Bld: 90 mg/dL (ref 70–99)
Potassium: 4.1 mmol/L (ref 3.5–5.1)
Sodium: 138 mmol/L (ref 135–145)
Total Bilirubin: 0.8 mg/dL (ref 0.3–1.2)
Total Protein: 6.9 g/dL (ref 6.5–8.1)

## 2023-03-05 LAB — LIPID PANEL
Cholesterol: 226 mg/dL — ABNORMAL HIGH (ref 0–200)
HDL: 51 mg/dL (ref >40)
LDL Cholesterol: 139 mg/dL — ABNORMAL HIGH (ref 0–99)
Total CHOL/HDL Ratio: 4.4 RATIO
Triglycerides: 182 mg/dL — ABNORMAL HIGH (ref <150)
VLDL: 36 mg/dL (ref 0–40)

## 2023-03-05 LAB — TSH: TSH: 2.472 u[IU]/mL (ref 0.350–4.500)

## 2023-03-07 ENCOUNTER — Inpatient Hospital Stay: Payer: Medicare HMO

## 2023-03-07 ENCOUNTER — Telehealth: Payer: Self-pay

## 2023-03-07 VITALS — BP 130/66 | HR 63 | Temp 97.4°F | Resp 18 | Wt 156.1 lb

## 2023-03-07 DIAGNOSIS — C50412 Malignant neoplasm of upper-outer quadrant of left female breast: Secondary | ICD-10-CM | POA: Diagnosis not present

## 2023-03-07 DIAGNOSIS — Z79899 Other long term (current) drug therapy: Secondary | ICD-10-CM | POA: Diagnosis not present

## 2023-03-07 DIAGNOSIS — C7951 Secondary malignant neoplasm of bone: Secondary | ICD-10-CM

## 2023-03-07 DIAGNOSIS — C787 Secondary malignant neoplasm of liver and intrahepatic bile duct: Secondary | ICD-10-CM | POA: Diagnosis not present

## 2023-03-07 DIAGNOSIS — Z79620 Long term (current) use of immunosuppressive biologic: Secondary | ICD-10-CM | POA: Diagnosis not present

## 2023-03-07 DIAGNOSIS — Z170 Estrogen receptor positive status [ER+]: Secondary | ICD-10-CM | POA: Diagnosis not present

## 2023-03-07 MED ORDER — DENOSUMAB 120 MG/1.7ML ~~LOC~~ SOLN
120.0000 mg | Freq: Once | SUBCUTANEOUS | Status: AC
  Administered 2023-03-07: 120 mg via SUBCUTANEOUS
  Filled 2023-03-07: qty 1.7

## 2023-03-07 NOTE — Telephone Encounter (Signed)
Patient notified of lab results

## 2023-03-07 NOTE — Telephone Encounter (Signed)
-----   Message from Dellia Beckwith, MD sent at 03/07/2023  2:03 PM EDT ----- Regarding: call Tell her labs all normal except chol 226, not bad

## 2023-03-10 DIAGNOSIS — I890 Lymphedema, not elsewhere classified: Secondary | ICD-10-CM | POA: Diagnosis not present

## 2023-03-10 DIAGNOSIS — C50912 Malignant neoplasm of unspecified site of left female breast: Secondary | ICD-10-CM | POA: Diagnosis not present

## 2023-03-12 ENCOUNTER — Telehealth: Payer: Self-pay | Admitting: Oncology

## 2023-03-12 NOTE — Telephone Encounter (Signed)
Patient has been scheduled. Aware of appt date and time   Scheduling Message Entered by Gery Pray H on 03/05/2023 at 11:56 AM Priority: Routine <No visit type provided>  Department: [facility]  Provider:  Scheduling Notes:  RT 4 weeks with labs and CT chest, abd/pelvis 2 days before appt.    Continue Xgeva every 4 weeks, due 5/31

## 2023-03-18 ENCOUNTER — Encounter: Payer: Self-pay | Admitting: Oncology

## 2023-03-18 DIAGNOSIS — E039 Hypothyroidism, unspecified: Secondary | ICD-10-CM

## 2023-03-18 HISTORY — DX: Hypothyroidism, unspecified: E03.9

## 2023-03-24 ENCOUNTER — Encounter: Payer: Self-pay | Admitting: Oncology

## 2023-03-24 NOTE — Telephone Encounter (Signed)
done

## 2023-03-31 DIAGNOSIS — C50912 Malignant neoplasm of unspecified site of left female breast: Secondary | ICD-10-CM | POA: Diagnosis not present

## 2023-03-31 DIAGNOSIS — J90 Pleural effusion, not elsewhere classified: Secondary | ICD-10-CM | POA: Diagnosis not present

## 2023-03-31 DIAGNOSIS — C801 Malignant (primary) neoplasm, unspecified: Secondary | ICD-10-CM | POA: Diagnosis not present

## 2023-03-31 DIAGNOSIS — C787 Secondary malignant neoplasm of liver and intrahepatic bile duct: Secondary | ICD-10-CM | POA: Diagnosis not present

## 2023-03-31 LAB — BASIC METABOLIC PANEL
BUN: 15 (ref 4–21)
CO2: 26 — AB (ref 13–22)
Chloride: 106 (ref 99–108)
Creatinine: 0.7 (ref 0.5–1.1)
Glucose: 85
Potassium: 4.1 mEq/L (ref 3.5–5.1)
Sodium: 139 (ref 137–147)

## 2023-03-31 LAB — CBC: RBC: 4.39 (ref 3.87–5.11)

## 2023-03-31 LAB — CBC AND DIFFERENTIAL
HCT: 40 (ref 36–46)
Hemoglobin: 13.2 (ref 12.0–16.0)
Neutrophils Absolute: 2.86
Platelets: 210 10*3/uL (ref 150–400)
WBC: 5.1

## 2023-03-31 LAB — HEPATIC FUNCTION PANEL
ALT: 20 U/L (ref 7–35)
AST: 39 — AB (ref 13–35)
Alkaline Phosphatase: 48 (ref 25–125)
Bilirubin, Total: 0.6

## 2023-03-31 LAB — COMPREHENSIVE METABOLIC PANEL
Albumin: 3.7 (ref 3.5–5.0)
Calcium: 9.5 (ref 8.7–10.7)

## 2023-04-01 NOTE — Progress Notes (Signed)
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 CT chest in December revealed right middle and lower lobe collapse with large right pleural effusion. PET was negative.  She required thoracentesis on 2 occasions, the last in January.  Cytology revealed malignant cells consistent with breast primary, ER/PR positive.  She was started on anastrozole 1 mg daily on October 11, 2021.  Oral chemotherapy with palbociclib 125 mg was added in February.  After 1 cycle, she developed severe neutropenia. Her 2nd cycle was postponed 1 week.  Prior to a 3rd cycle, she had recurrent neutropenia, so her chemotherapy was held for 2 weeks and the palbociclib dose reduced to 100 mg daily.  She had persistent neutropenia and the dose was decreased to 75mg  daily.  CT chest, abdomen and pelvis on July 24 revealed a decrease in the large right pleural effusion and compressive right lung atelectasis. Now her scan in January, 2024 reveals a new nodule in  the right middle lobe measuring 7mm. But more concerning are the new lesions in the liver as of February 1st, 2024. She still has a large right pleural effusion that remains stable. 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 Restaging scans in June, 2024 revealed progression of her liver metastasis and treatment will be changed.   Right Lung Mass She has a hypermetabolic right infrahilar mass measuring 2.4cm with an SUV 16.87 found in February, 2024. This was found to be metastatic breast cancer. She has never smoked. 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 The lung nodule decreased from 7mm to 6mm.   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 We therefore obtained a liver biopsy to confirm the diagnosis and this is positive for metastatic breast cancer, ER positive and HER2 negative. 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  Her restaging scans now show moderate progression of her liver metastasis with innumerable lesions in both lobes including multiple new lesions. Fortunately she is asymptomatic on this and her liver function tests are essentially normal. We will therefore stop the Orserdu and change her to Faslodex injections 500mg  monthly along with Verzenio 100mg  BID. We are starting at a reduced dose of this due to her advanced age and multiple prior treatments.    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  She had recurrent disease found in December 2022.   Malignant Neoplasm Metastatic to Bone On PET scans she has multiple bone lesions including the right 11th rib, left iliac bone, and left sacrum. She also has metastases noted in muscle tissue, bilateral paraspinal musculature. She is now on Xgeva every 4 weeks. Fortunately she is asymptomatic from her bone metastasis.   Osteopenia Osteopenia, for which she has been receiving denosumab every 6 months, next due in September.  She  continues calcium and vitamin-D as recommended.  She had a repeat bone density in December, 2023, which is stable.  Her last dose was given in September. She will now change to monthly injections due to her bone metastases.    Lymphedema This is 1+ of her left upper extremity, noted  since 2023.  I gave her some literature about the diagnosis of lymphedema.  I find no adenopathy or tumor of the axilla.     Plan  She was placed on Orserdu several months ago and now is here for restaging scans. She had CT scans done on 03/31/2023 that revealed moderate progression of hepatic metastasis, as evidence by development of innumerable bilobar lesions and enlargement of previously detailed lesions. There was also a mild enlargement of moderate right pleural effusion, a new tiny left pleural effusion, and similar right middle lobe pulmonary nodule. She is currently on Orserdu and I advised that we switch her to [employer]. I explained common toxicities to her such as diarrhea.  However, it should not effect her blood counts as much as Ibrance did. I informed her if it is too bad, then we can adjust the dose. She will now switch to Verzenio 100 mg twice daily and receive Faslodex injections 500 mg monthly. She will start Verzenio on 04/04/2023. She has already been treated with Letrozole for 10 years back in 2008 and Anastrozole for at least a year. She will continue her Xgeva injections every 4 weeks and she is due for her Xgeva injection this Friday on 04/04/2023. Her CBC and CMP is normal as of today. I will see her back in 2 weeks with CBC and CMP. We will plan to monitor her every 2 weeks for the next 2 months as she starts this new therapy, then every 4 weeks if stable. I have discussed the risks and benefits of this change in treatment. The patient understands the plans discussed today and is in agreement with them.  I reviewed all of this information with her today and answered her questions.  She knows to contact our office if she develops concerns prior to her next appointment.  I provided 33 minutes of face-to-face time during this encounter and > 50% was spent counseling as documented under my assessment and plan.      Dellia Beckwith, MD  [facility] AT [facility] [address] [place] [address] Dept: [phone] Dept Fax: [phone]   No orders of the defined types were placed in this encounter.    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 She was treated with lumpectomy.  Pathology revealed a 1.5 cm, grade 1, invasive ductal carcinoma with 5 positive nodes.  Estrogen and progesterone receptors were positive and Her-2 Neu negative.  She received adjuvant chemotherapy with Adriamycin/Cytoxan for 3 months, followed by Taxol for 3 months.  She then received adjuvant radiation to the left breast.  She was placed on letrozole in April 2008 and completed 10 years of adjuvant hormonal therapy in April 2018.  Bone density scan in June 2015 revealed significant osteopenia in the forearm, with a T-score of  -2.3, despite being on alendronate, so she  was placed on Prolia (denosumab) 60 mg every 6 months.   Repeat bone density in September 2017 revealed slight improvement in the bone density, with a T-score of -2.1 in the forearm and a T-score of -1.1 in the femur with, so she has continued denosumab every 6 months, in addition to calcium/vitamin D.  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    She had reversal of her colostomy in October 2017.  She has an incisional hernia in the abdomen, which does not bother her, so she has decided against surgical repair.  She has atrial fibrillation and is on apixaban 5 mg twice daily.  Bone density scan in September 2019 revealed improvement in the bone density with a T-score of -1.6 in forearm.  The bone density of the femur had normalized with a  T-score of -0.8.  She has continued denosumab every 6 months.  Annual bilateral mammogram in December 2020 did not reveal any evidence of malignancy.  She contracted COVID-19 later in December 2020.  She did not have to be hospitalized and fully recovered.  Bone density from September 2021 revealed mildly worsened osteopenia with a T-score of -2.0 of the left forearm radius, previously -1.6.  Left femur neck is normal at -0.9, previously -0.8.  She continues calcium 1200 mg with vitamin-D daily.   She presented to the emergency department in December 2022 due to shortness of breath that had been ongoing for the past 3 weeks. She was also having some left substernal area pain. ECHO revealed a normal EF between 60-65%. CT chest revealed right middle and lower lobe collapse with large right pleural effusion. There was no central obstructing mass lesion evident, or obvious right-sided pleural disease, although there is a irregular focus of soft tissue attenuation along the medial right upper lobe pleura/right anterior mediastinum. There were scattered tiny bilateral pulmonary nodules measuring up to 4 mm. Thoracentesis was pursued yielding 1200 cc's of pleural fluid. Cytopathology confirmed malignant cells consistent with breast primary. CKAE1/3, GATA-3 and ER positive. HER2 was negative 1+. Estrogen receptor was positive at 90% and progesterone receptor was positive at 20%. Ki67 was <1%. MRI brain was negative for evidence of metastasis. PET imaging from January 26th revealed no hypermetabolic activity to localize active metastatic breast carcinoma. Large right pleural effusion occupies 75% of the right hemithorax. Along the anteromedial margin of the right diaphragm there is hypermetabolic linear thickening of the diaphragm.  Oncology History  Malignant neoplasm of upper-outer quadrant of left female breast (HCC)  03/07/2006 Initial Diagnosis   Breast cancer, left breast (HCC)   03/17/2006 Cancer Staging    Staging form: Breast, AJCC 6th Edition - Clinical stage from 03/17/2006: Stage IIIA (T1c, N2a, M0) - Signed by Dellia Beckwith, MD on 12/15/2020 Staged by: Managing physician Diagnostic confirmation: Positive histology Specimen type: Excision Histopathologic type: Infiltrating duct carcinoma, NOS Tumor size (mm): 15 Laterality: Left Total positive nodes: 5 Histologic grade (G): G1 Residual tumor (R): R0 - None Stage prefix: Initial diagnosis Lymphatic vessel invasion (L): L0 - No lymphatic vessel invasion Venous invasion (V): V0 - No venous invasion Prognostic indicators: ER/PR pos, HER 2 neg., Rx with AC x 3 mo, then Taxol x 3 mo.,AI x 10 yrs   04/04/2023 -  Chemotherapy   Patient is on Treatment Plan : BREAST Fulvestrant q28d     Malignant neoplasm metastatic to bone (HCC)  11/08/2022 Initial Diagnosis   Malignant neoplasm metastatic to bone (HCC)   04/04/2023 -  Chemotherapy   Patient is on Treatment Plan : BREAST Fulvestrant q28d         INTERVAL HISTORY:  Satine is here today for repeat clinical assessment of recurrent hormone receptor positive 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 She was placed on Orserdu several months ago and now is here for restaging scans. She had CT scans done on 03/31/2023 that revealed moderate progression of hepatic metastasis, as evidence by development of innumerable bilobar lesions and enlargement of previously detailed lesions. There was also a mild enlargement of moderate right pleural effusion, a new tiny left pleural effusion, and similar right middle lobe pulmonary nodule. She is currently on Orserdu and I advised that we switch her to [employer]. I explained common toxicities to her such as diarrhea.  However, it should not effect her blood counts as much as Ibrance did. I informed her if it is too bad, then we can adjust the dose. She will now switch to Verzenio  100 mg twice daily and receive Faslodex injections 500 mg monthly. She has already been treated with Letrozole for 10 years back in 2008 and Anastrozole for at least a year. She will continue her Xgeva injections every 4 weeks and she is due for her Xgeva injection this Friday on 04/04/2023. Patient states that she feels well and has no complaints of pain. Her CBC and CMP is normal as of today. I will see her back in 2 weeks with CBC and CMP. We will plan to monitor her every 2 weeks for the next 2 months as she starts this new therapy, then every 4 weeks if stable. I have discussed the risks and benefits of this change in treatment. She denies signs of infection such as sore throat, sinus drainage, cough, or urinary symptoms.  She denies fevers or recurrent chills. She denies pain. She denies nausea, vomiting, chest pain, dyspnea or cough. Her appetite is good and her weight has been stable.  REVIEW OF SYSTEMS:  Review of Systems  Constitutional: Negative.  Negative for appetite change, chills, diaphoresis, fatigue, fever and unexpected weight change.  HENT:  Negative.  Negative for hearing loss, lump/mass, mouth sores, nosebleeds, sore throat, tinnitus, trouble swallowing and voice change.   Eyes: Negative.  Negative for eye problems and icterus.  Respiratory: Negative.  Negative for chest tightness, cough, hemoptysis, shortness of breath and wheezing.   Cardiovascular: Negative.  Negative for chest pain, leg swelling and palpitations.  Gastrointestinal: Negative.  Negative for abdominal distention, abdominal pain, blood in stool, constipation, diarrhea, nausea, rectal pain and vomiting.  Endocrine: Negative.  Negative for hot flashes.  Genitourinary:  Negative for bladder incontinence, difficulty urinating, dyspareunia, dysuria, frequency, hematuria, menstrual problem, nocturia, pelvic pain, vaginal bleeding and vaginal discharge.   Musculoskeletal:  Positive for arthralgias (chronic arthritic knee  pain). Negative for back pain, flank pain, gait problem, myalgias, neck pain and neck stiffness.  Skin: Negative.  Negative for itching, rash and wound.  Neurological: Negative.  Negative for dizziness, extremity weakness, gait problem, headaches, light-headedness, numbness, seizures and speech difficulty.  Hematological: Negative.  Negative for adenopathy. Does not bruise/bleed easily.  Psychiatric/Behavioral: Negative.  Negative for confusion, decreased concentration, depression, sleep disturbance and suicidal ideas. The patient is not nervous/anxious.   All other systems reviewed and are negative.    VITALS:  Blood pressure 132/64, pulse 67, temperature 97.7 F (36.5 C), temperature source Oral, resp. rate 18, height 5'  2" (1.575 m), weight 157 lb 12.8 oz (71.6 kg), SpO2 95%.  Wt Readings from Last 3 Encounters:  04/16/23 153 lb 12.8 oz (69.8 kg)  04/04/23 156 lb 0.6 oz (70.8 kg)  04/02/23 157 lb 12.8 oz (71.6 kg)    Body mass index is 28.86 kg/m.  Performance status (ECOG): 0 - Asymptomatic  PHYSICAL EXAM:  Physical Exam Vitals and nursing note reviewed. Exam conducted with a chaperone present.  Constitutional:      General: She is not in acute distress.    Appearance: Normal appearance. She is normal weight. She is not ill-appearing, toxic-appearing or diaphoretic.  HENT:     Head: Normocephalic and atraumatic.     Right Ear: Tympanic membrane, ear canal and external ear normal. There is no impacted cerumen.     Left Ear: Tympanic membrane, ear canal and external ear normal. There is no impacted cerumen.     Nose: Nose normal. No congestion or rhinorrhea.     Mouth/Throat:     Mouth: Mucous membranes are moist.     Pharynx: Oropharynx is clear. No oropharyngeal exudate or posterior oropharyngeal erythema.  Eyes:     General: No scleral icterus.       Right eye: No discharge.        Left eye: No discharge.     Extraocular Movements: Extraocular movements intact.      Conjunctiva/sclera: Conjunctivae normal.     Pupils: Pupils are equal, round, and reactive to light.  Neck:     Vascular: No carotid bruit.  Cardiovascular:     Rate and Rhythm: Normal rate and regular rhythm.     Pulses: Normal pulses.     Heart sounds: Normal heart sounds. No murmur heard.    No friction rub. No gallop.  Pulmonary:     Effort: Pulmonary effort is normal. No respiratory distress.     Breath sounds: No stridor. Examination of the right-lower field reveals decreased breath sounds. Decreased breath sounds present. No wheezing, rhonchi or rales.  Chest:     Chest wall: No tenderness.  Abdominal:     General: Bowel sounds are normal. There is no distension.     Palpations: Abdomen is soft. There is no hepatomegaly, splenomegaly or mass.     Tenderness: There is no abdominal tenderness. There is no right CVA tenderness, left CVA tenderness, guarding or rebound.     Hernia: A hernia is present.     Comments: Chronic upper abdominal ventral wall hernia.   Musculoskeletal:        General: No swelling, tenderness, deformity or signs of injury. Normal range of motion.     Cervical back: Normal range of motion and neck supple. No rigidity or tenderness.     Right knee: Crepitus present.     Left knee: Crepitus present.     Right lower leg: No edema.     Left lower leg: No edema.     Comments: 1+ lymphedema of the left upper extremity    Lymphadenopathy:     Cervical: No cervical adenopathy.     Right cervical: No superficial, deep or posterior cervical adenopathy.    Left cervical: No superficial, deep or posterior cervical adenopathy.     Upper Body:     Right upper body: No supraclavicular, axillary or pectoral adenopathy.     Left upper body: No supraclavicular, axillary or pectoral adenopathy.  Skin:    General: Skin is warm and dry.     Coloration:  Skin is not jaundiced or pale.     Findings: No bruising, erythema, lesion or rash.  Neurological:     General: No  focal deficit present.     Mental Status: She is alert and oriented to person, place, and time. Mental status is at baseline.     Cranial Nerves: No cranial nerve deficit.     Sensory: No sensory deficit.     Motor: No weakness.     Coordination: Coordination normal.     Gait: Gait normal.     Deep Tendon Reflexes: Reflexes normal.  Psychiatric:        Mood and Affect: Mood normal.        Behavior: Behavior normal.        Thought Content: Thought content normal.        Judgment: Judgment normal.    LABS:      Latest Ref Rng & Units 04/16/2023   12:00 AM 03/31/2023   12:00 AM 03/05/2023   10:58 AM  CBC  WBC  4.2     5.1     5.0   Hemoglobin 12.0 - 16.0 13.0     13.2     12.9   Hematocrit 36 - 46 39     40     40.2   Platelets 150 - 400 K/uL 198     210     259      This result is from an external source.      Latest Ref Rng & Units 04/16/2023   12:00 AM 03/31/2023   12:00 AM 03/05/2023   10:58 AM  CMP  Glucose 70 - 99 mg/dL   90   BUN 4 - 21 23     15     17    Creatinine 0.5 - 1.1 1.0     0.7     0.83   Sodium 137 - 147 138     139     138   Potassium 3.5 - 5.1 mEq/L 4.6     4.1     4.1   Chloride 99 - 108 107     106     105   CO2 13 - 22 28     26     24    Calcium 8.7 - 10.7 9.6     9.5     9.2   Total Protein 6.5 - 8.1 g/dL   6.9   Total Bilirubin 0.3 - 1.2 mg/dL   0.8   Alkaline Phos 25 - 125 43     48     38   AST 13 - 35 30     39     22   ALT 7 - 35 U/L 12     20     12       This result is from an external source.   Component Ref Range & Units 03/05/2023 4 yr ago  TSH 0.350 - 4.500 uIU/mL 2.472 1.890 R   Lab Results  Component Value Date   CEA1 1.6 10/11/2021   /  CEA  Date Value Ref Range Status  10/11/2021 1.6 0.0 - 4.7 ng/mL Final    Comment:    (NOTE)                             Nonsmokers          <3.9  Smokers             <5.6 [employer] Electrochemiluminescence Immunoassay (ECLIA) Values obtained with  different assay methods or kits cannot be used interchangeably.  Results cannot be interpreted as absolute evidence of the presence or absence of malignant disease. Performed At: [facility] [address] [place], [place] [id number] Jolene Schimke MD WU:[id number]    Component Ref Range & Units 03/05/2023 6 mo ago 4 yr ago  Cholesterol 0 - 200 mg/dL 956 High  213   Triglycerides <150 mg/dL 086 High  578 469 High  R  HDL >40 mg/dL 51 44 43 R  Total CHOL/HDL Ratio RATIO 4.4 4.5 4.0 R, CM  VLDL 0 - 40 mg/dL 36 29   LDL Cholesterol 0 - 99 mg/dL 629 High  528 High  CM 86     No results found for: "PSA1" No results found for: "UXL244" No results found for: "CAN125"  No results found for: "TOTALPROTELP", "ALBUMINELP", "A1GS", "A2GS", "BETS", "BETA2SER", "GAMS", "MSPIKE", "SPEI" No results found for: "TIBC", "FERRITIN", "IRONPCTSAT" No results found for: "LDH"  STUDIES:    Exam: 03/31/2023 CT Chest, Abdomen, and Pelvis with Contrast Impression: Moderate progression of hepatic metastasis, as evidence by development of innumerable bilobar lesions and enlargement of previously detailed lesions.  a mild enlargement of moderate right pleural effusion. New tiny left pleural effusions.  Similar right middle lobe pulmonary nodule.     Exam: 11/07/2022 Nuclear Medicine PET skull Base to Thigh Impression: Widespread metastatic disease. Findings could be due to metastatic breast cancer or lung cancer. There is a sizable right lower lobe/right infrahilar mass which is more suggestive of a recurrent lung cancer with associated mediastinal and hilar adenopathy. Metastatic pulmonary and pleural nodules, metastatic hepatic and osseous lesions and muscle metastasis. Large right pleural effusion is likely malignant.  Other incidental findings as detailed.         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  Report date is 11/29/21.   The CustomNext-Cancer+RNAinsight panel offered by [employer] includes sequencing and rearrangement analysis for the following 47 genes:  APC, ATM, AXIN2, BARD1, BMPR1A, BRCA1, BRCA2, BRIP1, CDH1, CDK4, CDKN2A, CHEK2, DICER1, EPCAM, GREM1, HOXB13,    History of left breast cancer 06/13/2021   Hypotension 06/13/2021   Incisional hernia, without obstruction or gangrene 10/22/2016   Malignant neoplasm of upper-outer quadrant of left female breast (HCC) 03/07/2006   Mixed dyslipidemia 11/03/2018   Numbness in right leg    Hx: of   Obesity (BMI 30.0-34.9) 09/21/2020   Other specified disorders of bone density and structure, multiple sites 09/20/2020   Paroxysmal atrial fibrillation (HCC) 02/13/2016   Personal history of COVID-19 09/2019   Pleural effusion, 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   Pneumonia    Hx: of 'a long time ago"   Postoperative examination 02/05/2016    Past Surgical History:  Procedure Laterality Date   APPENDECTOMY  BACK SURGERY     BREAST SURGERY     Hx: of lumpectomy left breast 2007   COLONOSCOPY     Hx: of   DILATION AND CURETTAGE OF UTERUS     TONSILLECTOMY     TOTAL HIP ARTHROPLASTY Right 04/13/2013   Dr Magnus Ivan   TOTAL HIP ARTHROPLASTY Right 04/13/2013   Procedure: RIGHT TOTAL HIP ARTHROPLASTY ANTERIOR APPROACH;  Surgeon: Kathryne Hitch, MD;  Location: [facility] OR;  Service: Orthopedics;  Laterality: Right;   TUBAL LIGATION      Family History   Problem Relation Age of Onset   Hypertension Sister    Cancer Sister 53       uterine or ovarian   Bone cancer Sister 15   Breast cancer Sister        dx 33 and 83   Uterine cancer Sister 18   Colon cancer Sister 47   Breast cancer Sister 23   Hypertension Brother    Kidney cancer Brother 42   Cancer Brother 84       unknown type; mets   Liver cancer Brother    Liver cancer Brother 67   Pancreatic cancer Niece 17   Breast cancer Niece 24   Colon cancer Nephew 57       mets    Social History:  reports that she has never smoked. She has never used smokeless tobacco. She reports that she does not drink alcohol and does not use drugs.The patient is alone today.  Allergies:  Allergies  Allergen Reactions   Codeine     unknown    Sulfa Antibiotics     unknown   Sulfacetamide Sodium Other (See Comments)    unknown   Chlorhexidine Rash    Current Medications: Current Outpatient Medications  Medication Sig Dispense Refill   abemaciclib (VERZENIO) 100 MG tablet Take 1 tablet (100 mg total) by mouth 2 (two) times daily. 56 tablet 0   apixaban (ELIQUIS) 5 MG TABS tablet Take 1 tablet (5 mg total) by mouth 2 (two) times daily. 180 tablet 3   atorvastatin (LIPITOR) 10 MG tablet Take 1 tablet by mouth every other day.  4   benzonatate (TESSALON) 100 MG capsule Take 1 capsule (100 mg total) by mouth 3 (three) times daily as needed for cough. (Patient not taking: Reported on 04/16/2023) 20 capsule 5   calcium carbonate (OSCAL) 1500 (600 Ca) MG TABS tablet Take 600 mg of elemental calcium by mouth daily with breakfast.     cholecalciferol (VITAMIN D) 1000 UNITS tablet Take 1,000 Units by mouth daily.     denosumab (XGEVA) 120 MG/1.7ML SOLN injection Inject 120 mg into the skin as directed. Once every 4 weeks     Flaxseed, Linseed, OIL Take 1 capsule by mouth daily.     Fulvestrant (FASLODEX IM) Inject into the muscle. 500 mg injection every 4 weeks     glucosamine-chondroitin 500-400  MG tablet Take 1 tablet by mouth 2 (two) times daily.      levothyroxine (SYNTHROID, LEVOTHROID) 75 MCG tablet Take 75 mcg by mouth daily.   7   metoprolol tartrate (LOPRESSOR) 25 MG tablet Take 1 tablet (25 mg total) by mouth 2 (two) times daily. 180 tablet 0   Multiple Vitamin (MULTIVITAMIN WITH MINERALS) TABS Take 1 tablet by mouth daily.     ondansetron (ZOFRAN) 4 MG tablet Take 1 tablet (4 mg total) by mouth every 4 (four) hours as needed for nausea or vomiting. 30 tablet 5  prochlorperazine (COMPAZINE) 10 MG tablet Take 1 tablet (10 mg total) by mouth every 6 (six) hours as needed for nausea or vomiting. 30 tablet 5   No current facility-administered medications for this visit.    I,Jasmine M Lassiter,acting as a scribe for Dellia Beckwith, MD.,have documented all relevant documentation on the behalf of Dellia Beckwith, MD,as directed by  Dellia Beckwith, MD while in the presence of Dellia Beckwith, MD.

## 2023-04-02 ENCOUNTER — Other Ambulatory Visit (HOSPITAL_COMMUNITY): Payer: Self-pay

## 2023-04-02 ENCOUNTER — Telehealth: Payer: Self-pay

## 2023-04-02 ENCOUNTER — Other Ambulatory Visit: Payer: Self-pay

## 2023-04-02 ENCOUNTER — Encounter: Payer: Self-pay | Admitting: Oncology

## 2023-04-02 ENCOUNTER — Telehealth: Payer: Self-pay | Admitting: Pharmacy Technician

## 2023-04-02 ENCOUNTER — Inpatient Hospital Stay: Payer: Medicare HMO | Attending: Oncology | Admitting: Oncology

## 2023-04-02 ENCOUNTER — Other Ambulatory Visit: Payer: Self-pay | Admitting: Oncology

## 2023-04-02 VITALS — BP 132/64 | HR 67 | Temp 97.7°F | Resp 18 | Ht 62.0 in | Wt 157.8 lb

## 2023-04-02 DIAGNOSIS — C7951 Secondary malignant neoplasm of bone: Secondary | ICD-10-CM

## 2023-04-02 DIAGNOSIS — Z170 Estrogen receptor positive status [ER+]: Secondary | ICD-10-CM

## 2023-04-02 DIAGNOSIS — C50412 Malignant neoplasm of upper-outer quadrant of left female breast: Secondary | ICD-10-CM | POA: Diagnosis not present

## 2023-04-02 DIAGNOSIS — Z5111 Encounter for antineoplastic chemotherapy: Secondary | ICD-10-CM | POA: Insufficient documentation

## 2023-04-02 DIAGNOSIS — C787 Secondary malignant neoplasm of liver and intrahepatic bile duct: Secondary | ICD-10-CM | POA: Diagnosis not present

## 2023-04-02 MED ORDER — ABEMACICLIB 100 MG PO TABS
100.0000 mg | ORAL_TABLET | Freq: Two times a day (BID) | ORAL | 0 refills | Status: DC
Start: 2023-04-02 — End: 2023-04-23
  Filled 2023-04-02 (×2): qty 56, 28d supply, fill #0

## 2023-04-02 NOTE — Telephone Encounter (Signed)
Oral Oncology Patient Advocate Encounter  Prior Authorization for Carrie Wilkerson has been approved.    PA# [id number] Effective dates: 04/02/23 through 10/07/23  Patients co-pay is $0.    Carrie Wilkerson, CPhT-Adv Oncology Pharmacy Patient Advocate [facility] Direct Number: [phone]  Fax: [phone]

## 2023-04-02 NOTE — Telephone Encounter (Signed)
Oral Chemotherapy Pharmacist Encounter  I spoke with patient for overview of: Verzenio for the treatment of metastatic, hormone-receptor positive breast cancer, in combination with fulvestrant, planned duration until disease progression or unacceptable toxicity.   Counseled patient on administration, dosing, side effects, monitoring, drug-food interactions, safe handling, storage, and disposal.  Patient will take Verzenio 100mg  tablets, 1 tablet by mouth twice daily without regard to food.  Patient knows to avoid grapefruit and grapefruit juice.  Verzenio start date: 04/04/23  Adverse effects include but are not limited to: diarrhea, fatigue, nausea, abdominal pain, decreased blood counts, and increased liver function tests, and joint pains. Severe, life-threatening, and/or fatal interstitial lung disease (ILD) and/or pneumonitis may occur with CDK 4/6 inhibitors.  Patient has anti-emetic on hand and knows to take it if nausea develops.   Patient will obtain anti diarrheal and alert the office of 4 or more loose stools above baseline.  Reviewed with patient importance of keeping a medication schedule and plan for any missed doses. No barriers to medication adherence identified.  Medication reconciliation performed and medication/allergy list updated.  Insurance authorization for [employer] has been obtained. Test claim at the pharmacy revealed copayment $0 for 1st fill of 28 days. This will ship from the [facility] outpatient pharmacy on 04/02/23 to deliver to patient's home on 04/03/23.  Patient informed the pharmacy will reach out 5-7 days prior to needing next fill of Verzenio to coordinate continued medication acquisition to prevent break in therapy.  All questions answered.  Patient voiced understanding and appreciation.   Medication education handout placed in mail for patient. Patient knows to call the office with questions or concerns. Oral Chemotherapy Clinic phone number  provided to patient.   Bethel Born, PharmD Hematology/Oncology Clinical Pharmacist [facility] Oral Chemotherapy Navigation Clinic [phone] 04/02/2023   12:06 PM

## 2023-04-02 NOTE — Telephone Encounter (Signed)
Oral Oncology Pharmacist Encounter  Received new prescription for Verzenio (abemaciclib) for the treatment of metastatic ER positive, HER2 negative breast cancer in conjunction with fulvestrant, planned duration until disease progression or unacceptable toxicity.  Labs from 03/31/23 assessed, no interventions needed. Prescription dose and frequency assessed for appropriateness.   Current medication list in Epic reviewed, no significant/ relevant DDIs with Verzenio identified.  Evaluated chart and no patient barriers to medication adherence noted.   Patient agreement for treatment documented in MD note on 04/02/2023.  Prescription has been e-scribed to the [facility] for benefits analysis and approval.  Oral Oncology Clinic will continue to follow for insurance authorization, copayment issues, initial counseling and start date.  Carrie Wilkerson, PharmD Hematology/Oncology Clinical Pharmacist [facility] Oral Chemotherapy Navigation Clinic [phone] 04/02/2023 10:46 AM

## 2023-04-02 NOTE — Addendum Note (Signed)
Addended by: Domenic Schwab on: 04/02/2023 11:13 AM   Modules accepted: Orders

## 2023-04-02 NOTE — Telephone Encounter (Signed)
Oral Oncology Patient Advocate Encounter   Received notification that prior authorization for Verzenio is required.   PA submitted on 04/02/23 Key [id number] Status is pending     Jinger Neighbors, CPhT-Adv Oncology Pharmacy Patient Advocate [facility] Direct Number: [phone]  Fax: [phone]

## 2023-04-03 ENCOUNTER — Other Ambulatory Visit: Payer: Self-pay

## 2023-04-04 ENCOUNTER — Inpatient Hospital Stay: Payer: Medicare HMO

## 2023-04-04 ENCOUNTER — Telehealth: Payer: Self-pay

## 2023-04-04 VITALS — BP 114/63 | HR 80 | Temp 97.5°F | Resp 18 | Wt 156.0 lb

## 2023-04-04 DIAGNOSIS — C50412 Malignant neoplasm of upper-outer quadrant of left female breast: Secondary | ICD-10-CM

## 2023-04-04 DIAGNOSIS — Z170 Estrogen receptor positive status [ER+]: Secondary | ICD-10-CM | POA: Diagnosis not present

## 2023-04-04 DIAGNOSIS — C7951 Secondary malignant neoplasm of bone: Secondary | ICD-10-CM | POA: Diagnosis not present

## 2023-04-04 DIAGNOSIS — Z5111 Encounter for antineoplastic chemotherapy: Secondary | ICD-10-CM | POA: Diagnosis not present

## 2023-04-04 MED ORDER — DENOSUMAB 120 MG/1.7ML ~~LOC~~ SOLN
120.0000 mg | Freq: Once | SUBCUTANEOUS | Status: AC
  Administered 2023-04-04: 120 mg via SUBCUTANEOUS
  Filled 2023-04-04: qty 1.7

## 2023-04-04 MED ORDER — FULVESTRANT 250 MG/5ML IM SOSY
500.0000 mg | PREFILLED_SYRINGE | Freq: Once | INTRAMUSCULAR | Status: AC
  Administered 2023-04-04: 500 mg via INTRAMUSCULAR
  Filled 2023-04-04: qty 10

## 2023-04-04 NOTE — Patient Instructions (Signed)
Denosumab Injection (Oncology) What is this medication? DENOSUMAB (den oh SUE mab) prevents weakened bones caused by cancer. It may also be used to treat noncancerous bone tumors that cannot be removed by surgery. It can also be used to treat high calcium levels in the blood caused by cancer. It works by blocking a protein that causes bones to break down quickly. This slows down the release of calcium from bones, which lowers calcium levels in your blood. It also makes your bones stronger and less likely to break (fracture). This medicine may be used for other purposes; ask your health care provider or pharmacist if you have questions. COMMON BRAND NAME(S): XGEVA What should I tell my care team before I take this medication? They need to know if you have any of these conditions: Dental disease Having surgery or tooth extraction Infection Kidney disease Low levels of calcium or vitamin D in the blood Malnutrition On hemodialysis Skin conditions or sensitivity Thyroid or parathyroid disease An unusual reaction to denosumab, other medications, foods, dyes, or preservatives Pregnant or trying to get pregnant Breast-feeding How should I use this medication? This medication is for injection under the skin. It is given by your care team in a hospital or clinic setting. A special MedGuide will be given to you before each treatment. Be sure to read this information carefully each time. Talk to your care team about the use of this medication in children. While it may be prescribed for children as young as 13 years for selected conditions, precautions do apply. Overdosage: If you think you have taken too much of this medicine contact a poison control center or emergency room at once. NOTE: This medicine is only for you. Do not share this medicine with others. What if I miss a dose? Keep appointments for follow-up doses. It is important not to miss your dose. Call your care team if you are unable to  keep an appointment. What may interact with this medication? Do not take this medication with any of the following: Other medications containing denosumab This medication may also interact with the following: Medications that lower your chance of fighting infection Steroid medications, such as prednisone or cortisone This list may not describe all possible interactions. Give your health care provider a list of all the medicines, herbs, non-prescription drugs, or dietary supplements you use. Also tell them if you smoke, drink alcohol, or use illegal drugs. Some items may interact with your medicine. What should I watch for while using this medication? Your condition will be monitored carefully while you are receiving this medication. You may need blood work while taking this medication. This medication may increase your risk of getting an infection. Call your care team for advice if you get a fever, chills, sore throat, or other symptoms of a cold or flu. Do not treat yourself. Try to avoid being around people who are sick. You should make sure you get enough calcium and vitamin D while you are taking this medication, unless your care team tells you not to. Discuss the foods you eat and the vitamins you take with your care team. Some people who take this medication have severe bone, joint, or muscle pain. This medication may also increase your risk for jaw problems or a broken thigh bone. Tell your care team right away if you have severe pain in your jaw, bones, joints, or muscles. Tell your care team if you have any pain that does not go away or that gets worse. Talk   to your care team if you may be pregnant. Serious birth defects can occur if you take this medication during pregnancy and for 5 months after the last dose. You will need a negative pregnancy test before starting this medication. Contraception is recommended while taking this medication and for 5 months after the last dose. Your care team  can help you find the option that works for you. What side effects may I notice from receiving this medication? Side effects that you should report to your care team as soon as possible: Allergic reactions--skin rash, itching, hives, swelling of the face, lips, tongue, or throat Bone, joint, or muscle pain Low calcium level--muscle pain or cramps, confusion, tingling, or numbness in the hands or feet Osteonecrosis of the jaw--pain, swelling, or redness in the mouth, numbness of the jaw, poor healing after dental work, unusual discharge from the mouth, visible bones in the mouth Side effects that usually do not require medical attention (report to your care team if they continue or are bothersome): Cough Diarrhea Fatigue Headache Nausea This list may not describe all possible side effects. Call your doctor for medical advice about side effects. You may report side effects to FDA at 1-800-FDA-1088. Where should I keep my medication? This medication is given in a hospital or clinic. It will not be stored at home. NOTE: This sheet is a summary. It may not cover all possible information. If you have questions about this medicine, talk to your doctor, pharmacist, or health care provider.  2024 Elsevier/Gold Standard (2022-02-13 00:00:00) Fulvestrant Injection What is this medication? FULVESTRANT (ful VES trant) treats breast cancer. It works by blocking the hormone estrogen in breast tissue, which prevents breast cancer cells from spreading or growing. This medicine may be used for other purposes; ask your health care provider or pharmacist if you have questions. COMMON BRAND NAME(S): FASLODEX What should I tell my care team before I take this medication? They need to know if you have any of these conditions: Bleeding disorder Liver disease Low blood cell levels, such as low white cells, red cells, and platelets An unusual or allergic reaction to fulvestrant, other medications, foods, dyes, or  preservatives Pregnant or trying to get pregnant Breast-feeding How should I use this medication? This medication is injected into a muscle. It is given by your care team in a hospital or clinic setting. Talk to your care team about the use of this medication in children. Special care may be needed. Overdosage: If you think you have taken too much of this medicine contact a poison control center or emergency room at once. NOTE: This medicine is only for you. Do not share this medicine with others. What if I miss a dose? Keep appointments for follow-up doses. It is important not to miss your dose. Call your care team if you are unable to keep an appointment. What may interact with this medication? Certain medications that prevent or treat blood clots, such as warfarin, enoxaparin, dalteparin, apixaban, dabigatran, rivaroxaban This list may not describe all possible interactions. Give your health care provider a list of all the medicines, herbs, non-prescription drugs, or dietary supplements you use. Also tell them if you smoke, drink alcohol, or use illegal drugs. Some items may interact with your medicine. What should I watch for while using this medication? Your condition will be monitored carefully while you are receiving this medication. You may need blood work while taking this medication. Talk to your care team if you may be pregnant. Serious   birth defects can occur if you take this medication during pregnancy and for 1 year after the last dose. You will need a negative pregnancy test before starting this medication. Contraception is recommended while taking this medication and for 1 year after the last dose. Your care team can help you find the option that works for you. Do not breastfeed while taking this medication and for 1 year after the last dose. This medication may cause infertility. Talk to your care team if you are concerned about your fertility. What side effects may I notice from  receiving this medication? Side effects that you should report to your care team as soon as possible: Allergic reactions or angioedema--skin rash, itching or hives, swelling of the face, eyes, lips, tongue, arms, or legs, trouble swallowing or breathing Pain, tingling, or numbness in the hands or feet Side effects that usually do not require medical attention (report to your care team if they continue or are bothersome): Bone, joint, or muscle pain Constipation Headache Hot flashes Nausea Pain, redness, or irritation at injection site Unusual weakness or fatigue This list may not describe all possible side effects. Call your doctor for medical advice about side effects. You may report side effects to FDA at 1-800-FDA-1088. Where should I keep my medication? This medication is given in a hospital or clinic. It will not be stored at home. NOTE: This sheet is a summary. It may not cover all possible information. If you have questions about this medicine, talk to your doctor, pharmacist, or health care provider.  2024 Elsevier/Gold Standard (2022-02-05 00:00:00)  

## 2023-04-04 NOTE — Telephone Encounter (Signed)
Message left for patient that Faslodex will not be every two weeks. Patient will receive treatment monthly. Calendar given to patient at appt.

## 2023-04-05 ENCOUNTER — Other Ambulatory Visit: Payer: Self-pay

## 2023-04-08 ENCOUNTER — Telehealth: Payer: Self-pay

## 2023-04-08 NOTE — Telephone Encounter (Signed)
-----   Message from Dellia Beckwith, MD sent at 04/08/2023 10:40 AM EDT ----- Regarding: call Tell her labs look good (I don't think I had you call her already)

## 2023-04-08 NOTE — Telephone Encounter (Signed)
Attempted to contact patient. No answer. 

## 2023-04-11 ENCOUNTER — Other Ambulatory Visit: Payer: Self-pay | Admitting: Hematology and Oncology

## 2023-04-11 DIAGNOSIS — C50412 Malignant neoplasm of upper-outer quadrant of left female breast: Secondary | ICD-10-CM

## 2023-04-14 ENCOUNTER — Telehealth: Payer: Self-pay

## 2023-04-14 NOTE — Telephone Encounter (Signed)
I spoke with pt to see how she is tolerating the Verzenio. Pt replied, "It's doing ok". Pt takes the am dose with breakfast, and afternoon dose after supper. No missed doses. She denies N/V, headache, cough, SOB, skin rash, itching (other than baseline itching on her back that she has had for years she states), fever, and constipation. She had a couple episodes of diarrhea, but she feels is food related. Pt reminded to call us if she develops temp of 100.4 or higher, day or night. She verbalized understanding. We confirmed next appt for 04/16/2023.

## 2023-04-15 NOTE — Assessment & Plan Note (Signed)
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  She was treated with Orsedu, but had progressive disease on imaging in June.  She is now taking abemaciclib 100 mg twice daily with fulvestrant injections monthly.

## 2023-04-15 NOTE — Assessment & Plan Note (Signed)
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  She continues monthly denosumab.

## 2023-04-15 NOTE — Assessment & Plan Note (Addendum)
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  She initially had response with a decrease in the CA 27-29, as well as a decrease in the pleural effusion.  Unfortunately CT imaging in January revealed a new nodule in  the right middle lobe measuring 7mm, as well as new liver lesions. The  right pleural effusion remained stable.  PET scan revealed widespread disease with pulmonary and pleural nodules, hepatic metastasis and bone metastasis.  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  Repeat imaging in June unfortunately showed progressive metastasis within the liver.  She is now taking abemaciclib 100 mg twice daily with fulvestrant injections monthly started in June.  We are currently seeing her every 2 weeks for close monitoring.

## 2023-04-15 NOTE — Progress Notes (Signed)
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  She initially had response with a decrease in the CA 27-29, as well as a decrease in the pleural effusion.  Unfortunately CT imaging in January revealed a new nodule in  the right middle lobe measuring 7mm, as well as new liver lesions. The  right pleural effusion remained stable.  PET scan revealed widespread disease with pulmonary and pleural nodules, hepatic metastasis and bone metastasis.  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  Repeat imaging in June unfortunately showed progressive metastasis within the liver.  She is now taking abemaciclib 100 mg twice daily with fulvestrant injections monthly started in June.  We are currently seeing her every 2 weeks for close monitoring.  So far, she is tolerating abemaciclib/fulvestrant well.  She is clinically dehydrated, so I will have her increase clear fluids.  We will plan to see her back in 2 weeks with a CBC and comprehensive metabolic panel prior to her next fulvestrant and denosumab.  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  She was treated with Orsedu, but had progressive disease on imaging in June.  She is now taking abemaciclib 100 mg twice daily with fulvestrant injections monthly.  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  She continues monthly denosumab.    The patient understands the plans discussed today and is in agreement with them.  She knows to contact our office if she develops concerns  prior to her next appointment.   I provided 15 minutes of face-to-face time during this encounter and > 50% was spent counseling as documented under my assessment and plan.    [name], PA-C  [facility] [address] [place] [address] Dept: [phone] Dept Fax: [phone]   Orders Placed This Encounter  Procedures   CBC and differential    This external order was created through the Results Console.   CBC    This external order was created through the Results Console.   Basic metabolic panel    This external order was created through the Results Console.   Comprehensive metabolic panel    This external order was created through the Results Console.   Hepatic function panel    This external order was created through the Results Console.      CHIEF COMPLAINT:  CC: Recurrent hormone receptor positive breast cancer  Current Treatment:  Abemaciclib/fulvestrant/denosumab  HISTORY OF PRESENT ILLNESS:  Carrie Wilkerson is a 82 y.o. 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 She was treated with lumpectomy.  Pathology revealed a 1.5 cm, grade 1, invasive ductal carcinoma with 5 positive nodes.  Estrogen and progesterone receptors were positive and Her-2 Neu negative.  She received adjuvant chemotherapy with Adriamycin/Cytoxan for 3 months, followed by Taxol for 3 months.  She then received adjuvant radiation to the left breast.  She was placed on letrozole in April 2008 and completed 10 years of adjuvant hormonal  therapy in April 2018.  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   Repeat bone density in September 2017 revealed slight improvement in the bone density, with a  T-score of -2.1 in the forearm and a T-score of -1.1 in the femur with, so she has continued denosumab every 6 months, in addition to calcium/vitamin D.  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    She had reversal of her colostomy in October 2017.  She has an incisional hernia in the abdomen, which does not bother her, so she has decided against surgical repair.  She has atrial fibrillation and is on apixaban 5 mg twice daily.  Bone density scan in September 2019 revealed improvement in the bone density with a T-score of -1.6 in forearm.  The bone density of the femur had normalized with a T-score of -0.8.  She has continued denosumab every 6 months.  Annual bilateral mammogram in December 2020 did not reveal any evidence of malignancy.  She contracted COVID-19 later in December 2020.  She did not have to be hospitalized and fully recovered.  Bone density from September 2021 revealed mildly worsened osteopenia with a T-score of -2.0 of the left forearm radius, previously -1.6.  Left femur neck is normal at -0.9, previously -0.8.  She continued calcium 1200 mg with vitamin-D.   She presented to the emergency department in December 2022 due to shortness of breath and left substernal area pain. ECHO revealed a normal EF between 60-65%. CT chest revealed right middle and lower lobe collapse with large right pleural effusion. There was no central obstructing mass lesion evident, or obvious right-sided pleural disease, although there is a irregular focus of soft tissue attenuation along the medial right upper lobe pleura/right anterior mediastinum. There were scattered tiny bilateral pulmonary nodules measuring up to 4 mm. Thoracentesis was pursued yielding 1200 cc's of pleural fluid. Cytopathology confirmed malignant cells consistent with breast primary. CKAE1/3, GATA-3 and ER positive. HER2 was negative 1+. Estrogen receptor was positive at 90% and progesterone receptor was positive at  20%. Ki67 was <1%. MRI brain was negative for evidence of metastasis. PET imaging in January 2023 revealed no hypermetabolic activity to localize active metastatic breast carcinoma. Large right pleural effusion occupies 75% of the right hemithorax. Along the anteromedial margin of the right diaphragm there is hypermetabolic linear thickening of the diaphragm. She had a 2nd thoracentesis in January.  She was placed on anastrozole and palbociclib 125 mg daily for 3 weeks on and 1 week off.  She did not required further thoracentesis.  The dose of palbociclib was reduced to 100 mg daily with the 3rd cycle due to neutropenia.  The CA 27-29 had decreased from 123.6 to 40.4.  The dose of palbociclib was decreased again to 75 mg daily for 3 weeks on and 1 week off with her 5th cycle due to neutropenia. CT imaging in July 2023 revealed a decrease in the right pleural effusion and atelectasis without new or progressive disease.  She continued anastrozole/palbociclib which was eventually switched to 3 weeks on and 2 weeks off due to cytopenias.  Unfortunately, CT imaging in January revealed a new nodule in the right middle love measuring 7 mm, with a persistent right pleural effusion, as well as 3 new hypoenhancing liver lesions consistent with metastasis, the largest  measuring 18 mm.PET scan revealed widespread metastatic disease with metastatic pulmonary and pleural nodules, metastatic hepatic nodules and bone metastasis.  There was a a sizable right lower lobe/right infrahilar mass felt to be more suggestive of a recurrent lung cancer with associated mediastinal and hilar adenopathy.  She underwent ultrasound-guided liver biopsy February revealed metastatic carcinoma consistent with breast primary which was estrogen and progesterone receptor positive and HER2 negative.  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   CT imaging in June revealed  progression of hepatic metastasis with development of innumerable bilobar lesions and enlargement of the previous lesions, the largest now measuring 2.3 x 2.5 cm.  There was enlargement of the right pleural effusion with a new tiny left pleural effusion, with stable right middle lobe pulmonary nodule during 6 mm.  Apparently, the hypermetabolic right infrahilar mass measuring approximately 2.4 cm is not appreciated on CT.  Due to the progressive disease, surgery was discontinued.  She was placed on abemaciclib 100 mg twice daily with fulvestrant injections monthly and continued denosumab injections monthly.   Oncology History  Malignant neoplasm of upper-outer quadrant of left female breast (HCC)  03/07/2006 Initial Diagnosis   Breast cancer, left breast (HCC)   03/17/2006 Cancer Staging   Staging form: Breast, AJCC 6th Edition - Clinical stage from 03/17/2006: Stage IIIA (T1c, N2a, M0) - Signed by Dellia Beckwith, MD on 12/15/2020 Staged by: Managing physician Diagnostic confirmation: Positive histology Specimen type: Excision Histopathologic type: Infiltrating duct carcinoma, NOS Tumor size (mm): 15 Laterality: Left Total positive nodes: 5 Histologic grade (G): G1 Residual tumor (R): R0 - None Stage prefix: Initial diagnosis Lymphatic vessel invasion (L): L0 - No lymphatic vessel invasion Venous invasion (V): V0 - No venous invasion Prognostic indicators: ER/PR pos, HER 2 neg., Rx with AC x 3 mo, then Taxol x 3 mo.,AI x 10 yrs   04/04/2023 -  Chemotherapy   Patient is on Treatment Plan : BREAST Fulvestrant q28d     Malignant neoplasm metastatic to bone (HCC)  11/08/2022 Initial Diagnosis   Malignant neoplasm metastatic to bone (HCC)   04/04/2023 -  Chemotherapy   Patient is on Treatment Plan : BREAST Fulvestrant q28d         INTERVAL HISTORY:  Carrie Wilkerson is here today for repeat clinical assessment for 2-week follow-up on abemaciclib/fulvestrant.  She states she is tolerating  treatment without difficulty.  She reports mild fatigue she denies hot flashes.  She denies any changes in her breasts.  She denies abdominal pain she reports right knee pain, but not increased.  She has some itching of her back, which is chronic Dr. Gilman Buttner recommended dropping her fulvestrant dose this week.. She denies fevers or chills.  Her appetite is good. Her weight has decreased 3 pounds over last 2 weeks , which she attributes to eating more vegetables.  REVIEW OF SYSTEMS:  Review of Systems  Constitutional:  Positive for fatigue. Negative for appetite change, chills, fever and unexpected weight change.  HENT:   Negative for lump/mass, mouth sores and sore throat.   Respiratory:  Negative for cough and shortness of breath.   Cardiovascular:  Negative for chest pain and leg swelling.  Gastrointestinal:  Negative for abdominal pain, constipation, diarrhea, nausea and vomiting.  Endocrine: Negative for hot flashes.  Genitourinary:  Negative for difficulty urinating, dysuria, frequency and hematuria.   Musculoskeletal:  Positive for arthralgias (Right knee). Negative for back pain  and myalgias.  Skin:  Positive for itching (Chronic, upper back). Negative for rash.  Neurological:  Negative for dizziness and headaches.  Hematological:  Negative for adenopathy. Does not bruise/bleed easily.  Psychiatric/Behavioral:  Negative for depression and sleep disturbance. The patient is not nervous/anxious.      VITALS:  Blood pressure (!) 111/54, pulse 64, temperature 97.8 F (36.6 C), temperature source Oral, resp. rate 18, height 5\' 1"  (1.549 m), weight 153 lb 12.8 oz (69.8 kg), SpO2 95%.  Wt Readings from Last 3 Encounters:  04/16/23 153 lb 12.8 oz (69.8 kg)  04/04/23 156 lb 0.6 oz (70.8 kg)  04/02/23 157 lb 12.8 oz (71.6 kg)    Body mass index is 29.06 kg/m.  Performance status (ECOG): 1 - Symptomatic but completely ambulatory  PHYSICAL EXAM:  Physical Exam Vitals and nursing note  reviewed.  Constitutional:      General: She is not in acute distress.    Appearance: Normal appearance.  HENT:     Head: Normocephalic and atraumatic.     Mouth/Throat:     Mouth: Mucous membranes are moist.     Pharynx: Oropharynx is clear. No oropharyngeal exudate or posterior oropharyngeal erythema.  Eyes:     General: No scleral icterus.    Extraocular Movements: Extraocular movements intact.     Conjunctiva/sclera: Conjunctivae normal.     Pupils: Pupils are equal, round, and reactive to light.  Cardiovascular:     Rate and Rhythm: Normal rate and regular rhythm.     Heart sounds: Normal heart sounds. No murmur heard.    No friction rub. No gallop.  Pulmonary:     Effort: Pulmonary effort is normal.     Breath sounds: Normal breath sounds. No wheezing, rhonchi or rales.  Chest:  Breasts:    Right: Inverted nipple (Normal variant) present. No swelling, bleeding, mass, nipple discharge, skin change or tenderness.     Left: Inverted nipple (Normal variant) present. No swelling, bleeding, mass, nipple discharge, skin change or tenderness.     Comments: Fibrosis of the left lower quadrant which is stable.  Tiny tender cyst in the left upper inner quadrant about 10 o'clock. Abdominal:     General: There is no distension.     Palpations: Abdomen is soft. There is no hepatomegaly, splenomegaly or mass.     Tenderness: There is no abdominal tenderness.  Musculoskeletal:        General: Swelling (Lymphedema left upper extremity) present. Normal range of motion.     Cervical back: Normal range of motion and neck supple. No tenderness.     Right lower leg: No edema.     Left lower leg: No edema.  Lymphadenopathy:     Cervical: No cervical adenopathy.     Upper Body:     Right upper body: No supraclavicular or axillary adenopathy.     Left upper body: No supraclavicular or axillary adenopathy.     Lower Body: No right inguinal adenopathy. No left inguinal adenopathy.  Skin:     General: Skin is warm and dry.     Coloration: Skin is not jaundiced.     Findings: Lesion (Small excoriated lesions of the upper back not obvious rash) present. No rash.  Neurological:     Mental Status: She is alert and oriented to person, place, and time.     Cranial Nerves: No cranial nerve deficit.  Psychiatric:        Mood and Affect: Mood normal.  Behavior: Behavior normal.        Thought Content: Thought content normal.     LABS:      Latest Ref Rng & Units 04/16/2023   12:00 AM 03/31/2023   12:00 AM 03/05/2023   10:58 AM  CBC  WBC  4.2     5.1     5.0   Hemoglobin 12.0 - 16.0 13.0     13.2     12.9   Hematocrit 36 - 46 39     40     40.2   Platelets 150 - 400 K/uL 198     210     259      This result is from an external source.      Latest Ref Rng & Units 04/16/2023   12:00 AM 03/31/2023   12:00 AM 03/05/2023   10:58 AM  CMP  Glucose 70 - 99 mg/dL   90   BUN 4 - 21 23     15     17    Creatinine 0.5 - 1.1 1.0     0.7     0.83   Sodium 137 - 147 138     139     138   Potassium 3.5 - 5.1 mEq/L 4.6     4.1     4.1   Chloride 99 - 108 107     106     105   CO2 13 - 22 28     26     24    Calcium 8.7 - 10.7 9.6     9.5     9.2   Total Protein 6.5 - 8.1 g/dL   6.9   Total Bilirubin 0.3 - 1.2 mg/dL   0.8   Alkaline Phos 25 - 125 43     48     38   AST 13 - 35 30     39     22   ALT 7 - 35 U/L 12     20     12       This result is from an external source.     Lab Results  Component Value Date   CEA1 1.6 10/11/2021   /  CEA  Date Value Ref Range Status  10/11/2021 1.6 0.0 - 4.7 ng/mL Final    Comment:    (NOTE)                             Nonsmokers          <3.9                             Smokers             <5.6 [employer] Electrochemiluminescence Immunoassay (ECLIA) Values obtained with different assay methods or kits cannot be used interchangeably.  Results cannot be interpreted as absolute evidence of the presence or absence of malignant  disease. Performed At: [facility] [address] [place], [place] [id number] Jolene Schimke MD [id number]    No results found for: "PSA1" No results found for: "475 [id number]" No results found for: "CAN125"  No results found for: "TOTALPROTELP", "ALBUMINELP", "A1GS", "A2GS", "BETS", "BETA2SER", "GAMS", "MSPIKE", "SPEI" No results found for: "TIBC", "FERRITIN", "IRONPCTSAT" No results found for: "LDH"  STUDIES:  No results found.    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  Report date is 11/29/21.   The CustomNext-Cancer+RNAinsight panel offered by [facility] includes sequencing and rearrangement analysis for the following 47 genes:  APC, ATM, AXIN2, BARD1, BMPR1A, BRCA1, BRCA2, BRIP1, CDH1, CDK4, CDKN2A, CHEK2, DICER1, EPCAM, GREM1, HOXB13,    History of left breast cancer 06/13/2021   Hypotension 06/13/2021   Incisional hernia, without obstruction or gangrene 10/22/2016   Malignant neoplasm of upper-outer quadrant of left female breast (HCC) 03/07/2006   Mixed dyslipidemia 11/03/2018   Numbness in right leg    Hx: of   Obesity (BMI 30.0-34.9) 09/21/2020   Other specified disorders of bone density and structure, multiple sites 09/20/2020   Paroxysmal atrial fibrillation (HCC) 02/13/2016   Personal history of COVID-19 09/2019   Pleural effusion, 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   Pneumonia    Hx: of 'a long time ago"    Postoperative examination 02/05/2016    Past Surgical History:  Procedure Laterality Date   APPENDECTOMY     BACK SURGERY     BREAST SURGERY     Hx: of lumpectomy left breast 2007   COLONOSCOPY     Hx: of   DILATION AND CURETTAGE OF UTERUS     TONSILLECTOMY     TOTAL HIP ARTHROPLASTY Right 04/13/2013   Dr Magnus Ivan   TOTAL HIP ARTHROPLASTY Right 04/13/2013   Procedure: RIGHT TOTAL HIP ARTHROPLASTY ANTERIOR APPROACH;  Surgeon: Kathryne Hitch, MD;  Location: [facility] OR;  Service: Orthopedics;  Laterality: Right;   TUBAL LIGATION      Family History  Problem Relation Age of Onset   Hypertension Sister    Cancer Sister 10       uterine or ovarian   Bone cancer Sister 22   Breast cancer Sister        dx 100 and 14   Uterine cancer Sister 27   Colon cancer Sister 34   Breast cancer Sister 79   Hypertension Brother    Kidney cancer Brother 21   Cancer Brother 2       unknown type; mets   Liver cancer Brother    Liver cancer Brother 75   Pancreatic cancer Niece 68   Breast cancer Niece 36   Colon cancer Nephew 57       mets    Social History:  reports that she has never smoked. She has never used smokeless tobacco. She reports that she does not drink alcohol and does not use drugs.The patient is alone today.  Allergies:  Allergies  Allergen Reactions   Codeine     unknown    Sulfa Antibiotics     unknown   Sulfacetamide Sodium Other (See Comments)    unknown   Chlorhexidine Rash    Current Medications: Current Outpatient Medications  Medication Sig Dispense Refill   denosumab (XGEVA) 120 MG/1.7ML SOLN injection Inject 120 mg into the skin as directed. Once every 4 weeks     Fulvestrant (  FASLODEX IM) Inject into the muscle. 500 mg injection every 4 weeks     abemaciclib (VERZENIO) 100 MG tablet Take 1 tablet (100 mg total) by mouth 2 (two) times daily. 56 tablet 0   apixaban (ELIQUIS) 5 MG TABS tablet Take 1 tablet (5 mg total) by mouth 2 (two) times daily. 180  tablet 3   atorvastatin (LIPITOR) 10 MG tablet Take 1 tablet by mouth every other day.  4   benzonatate (TESSALON) 100 MG capsule Take 1 capsule (100 mg total) by mouth 3 (three) times daily as needed for cough. (Patient not taking: Reported on 04/16/2023) 20 capsule 5   calcium carbonate (OSCAL) 1500 (600 Ca) MG TABS tablet Take 600 mg of elemental calcium by mouth daily with breakfast.     cholecalciferol (VITAMIN D) 1000 UNITS tablet Take 1,000 Units by mouth daily.     Flaxseed, Linseed, OIL Take 1 capsule by mouth daily.     glucosamine-chondroitin 500-400 MG tablet Take 1 tablet by mouth 2 (two) times daily.      levothyroxine (SYNTHROID, LEVOTHROID) 75 MCG tablet Take 75 mcg by mouth daily.   7   metoprolol tartrate (LOPRESSOR) 25 MG tablet Take 1 tablet (25 mg total) by mouth 2 (two) times daily. 180 tablet 0   Multiple Vitamin (MULTIVITAMIN WITH MINERALS) TABS Take 1 tablet by mouth daily.     ondansetron (ZOFRAN) 4 MG tablet Take 1 tablet (4 mg total) by mouth every 4 (four) hours as needed for nausea or vomiting. 30 tablet 5   prochlorperazine (COMPAZINE) 10 MG tablet Take 1 tablet (10 mg total) by mouth every 6 (six) hours as needed for nausea or vomiting. 30 tablet 5   No current facility-administered medications for this visit.

## 2023-04-16 ENCOUNTER — Inpatient Hospital Stay: Payer: Medicare HMO | Attending: Hematology and Oncology

## 2023-04-16 ENCOUNTER — Inpatient Hospital Stay (INDEPENDENT_AMBULATORY_CARE_PROVIDER_SITE_OTHER): Payer: Medicare HMO | Admitting: Hematology and Oncology

## 2023-04-16 ENCOUNTER — Telehealth: Payer: Self-pay

## 2023-04-16 ENCOUNTER — Encounter: Payer: Self-pay | Admitting: Hematology and Oncology

## 2023-04-16 ENCOUNTER — Encounter: Payer: Self-pay | Admitting: Oncology

## 2023-04-16 VITALS — BP 111/54 | HR 64 | Temp 97.8°F | Resp 18 | Ht 61.0 in | Wt 153.8 lb

## 2023-04-16 DIAGNOSIS — J910 Malignant pleural effusion: Secondary | ICD-10-CM

## 2023-04-16 DIAGNOSIS — C50412 Malignant neoplasm of upper-outer quadrant of left female breast: Secondary | ICD-10-CM | POA: Diagnosis not present

## 2023-04-16 DIAGNOSIS — C787 Secondary malignant neoplasm of liver and intrahepatic bile duct: Secondary | ICD-10-CM | POA: Insufficient documentation

## 2023-04-16 DIAGNOSIS — Z170 Estrogen receptor positive status [ER+]: Secondary | ICD-10-CM | POA: Diagnosis not present

## 2023-04-16 DIAGNOSIS — C7951 Secondary malignant neoplasm of bone: Secondary | ICD-10-CM

## 2023-04-16 DIAGNOSIS — J90 Pleural effusion, not elsewhere classified: Secondary | ICD-10-CM | POA: Insufficient documentation

## 2023-04-16 DIAGNOSIS — Z5111 Encounter for antineoplastic chemotherapy: Secondary | ICD-10-CM | POA: Insufficient documentation

## 2023-04-16 DIAGNOSIS — D649 Anemia, unspecified: Secondary | ICD-10-CM | POA: Diagnosis not present

## 2023-04-16 LAB — BASIC METABOLIC PANEL
BUN: 23 — AB (ref 4–21)
CO2: 28 — AB (ref 13–22)
Creatinine: 1 (ref 0.5–1.1)
EGFR: 53
Glucose: 108
Potassium: 4.6 mEq/L (ref 3.5–5.1)
Sodium: 138 (ref 137–147)

## 2023-04-16 LAB — HEPATIC FUNCTION PANEL
ALT: 12 U/L (ref 7–35)
AST: 30 (ref 13–35)
Alkaline Phosphatase: 43 (ref 25–125)
Bilirubin, Total: 0.5

## 2023-04-16 LAB — COMPREHENSIVE METABOLIC PANEL
Albumin: 3.6 (ref 3.5–5.0)
Calcium: 9.6 (ref 8.7–10.7)

## 2023-04-16 LAB — CBC AND DIFFERENTIAL
HCT: 39 (ref 36–46)
Hemoglobin: 13 (ref 12.0–16.0)
Neutrophils Absolute: 2.31
Platelets: 198 10*3/uL (ref 150–400)
WBC: 4.2

## 2023-04-16 LAB — CBC: RBC: 4.32 (ref 3.87–5.11)

## 2023-04-16 NOTE — Telephone Encounter (Signed)
-----   Message from Christine H McCarty, MD sent at 04/08/2023 10:40 AM EDT ----- Regarding: call Tell her labs look good (I don't think I had you call her already)  

## 2023-04-17 ENCOUNTER — Other Ambulatory Visit (HOSPITAL_COMMUNITY): Payer: Self-pay

## 2023-04-17 ENCOUNTER — Encounter: Payer: Self-pay | Admitting: Oncology

## 2023-04-17 ENCOUNTER — Telehealth: Payer: Self-pay

## 2023-04-17 ENCOUNTER — Encounter: Payer: Self-pay | Admitting: Hematology and Oncology

## 2023-04-17 NOTE — Telephone Encounter (Signed)
Patient notified and voiced understanding.

## 2023-04-17 NOTE — Telephone Encounter (Signed)
-----   Message from [name] sent at 04/17/2023  4:28 PM EDT ----- Please let her know labs show she is dehydrated, have her increase clear fluids. Thanks

## 2023-04-21 ENCOUNTER — Other Ambulatory Visit: Payer: Self-pay

## 2023-04-23 ENCOUNTER — Other Ambulatory Visit: Payer: Self-pay | Admitting: Oncology

## 2023-04-23 ENCOUNTER — Other Ambulatory Visit (HOSPITAL_COMMUNITY): Payer: Self-pay

## 2023-04-23 ENCOUNTER — Other Ambulatory Visit: Payer: Self-pay

## 2023-04-23 DIAGNOSIS — Z170 Estrogen receptor positive status [ER+]: Secondary | ICD-10-CM

## 2023-04-23 DIAGNOSIS — C787 Secondary malignant neoplasm of liver and intrahepatic bile duct: Secondary | ICD-10-CM

## 2023-04-23 MED ORDER — ABEMACICLIB 100 MG PO TABS
100.0000 mg | ORAL_TABLET | Freq: Two times a day (BID) | ORAL | 0 refills | Status: DC
Start: 2023-04-23 — End: 2023-05-19
  Filled 2023-04-23: qty 56, 28d supply, fill #0

## 2023-04-24 ENCOUNTER — Telehealth: Payer: Self-pay

## 2023-04-24 NOTE — Telephone Encounter (Signed)
I spoke with pt. She admits to fatigue. She is having intermittent diarrhea, but feels it maybe the fresh vegetables she is eating. I told her to be watchful, as diarrhea is a very common side effect with Verzenio. Having frequent diarrhea can cause her electrolytes to lower. I asked if she had Imodium in the home? She replied, "Yes. I took some Tuesday". No diarrhea today. Pt denies N/V, fevers, chills, & skin rash. She has chronic itching of her upper back that is baseline. Appetite is good. I reminded pt to call us if she develops temp of 100.4 or higher, day or night. She verbalized understanding. I also confirmed her next appt for 04/30/2023.

## 2023-04-25 ENCOUNTER — Other Ambulatory Visit (HOSPITAL_COMMUNITY): Payer: Self-pay

## 2023-04-28 ENCOUNTER — Other Ambulatory Visit: Payer: Self-pay | Admitting: Oncology

## 2023-04-28 DIAGNOSIS — C50412 Malignant neoplasm of upper-outer quadrant of left female breast: Secondary | ICD-10-CM

## 2023-04-28 DIAGNOSIS — C7951 Secondary malignant neoplasm of bone: Secondary | ICD-10-CM

## 2023-04-29 NOTE — Progress Notes (Signed)
[facility]  [address] 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 CT chest in December revealed right middle and lower lobe collapse with large right pleural effusion. PET was negative.  She required thoracentesis on 2 occasions, the last in January.  Cytology revealed malignant cells consistent with breast primary, ER/PR positive.  She was started on anastrozole 1 mg daily on October 11, 2021.  Oral chemotherapy with palbociclib 125 mg was added in February.  After 1 cycle, she developed severe neutropenia. Her 2nd cycle was postponed 1 week.  Prior to a 3rd cycle, she had recurrent neutropenia, so her chemotherapy was held for 2 weeks and the palbociclib dose reduced to 100 mg daily.  She had persistent neutropenia and the dose was decreased to 75mg  daily.  CT chest, abdomen and pelvis on July 24 revealed a decrease in the large right pleural effusion and compressive right lung atelectasis. Now her scan in January, 2024 reveals a new nodule in  the right middle lobe measuring 7mm. But more concerning are the new lesions in the liver as of February 1st, 2024. She still has a large right pleural effusion that remains stable. 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 Restaging scans in June, 2024 revealed progression of her liver metastasis and treatment was changed to Faslodex injections and Verzenio at 100 mg twice daily. She seems to be tolerating this thus so far but having expected toxicities.   Right Lung Mass She has a hypermetabolic right infrahilar mass measuring 2.4cm with an SUV 16.87 found in February, 2024. This was found to be metastatic breast cancer.  She has never smoked.  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 The lung nodule decreased from 7mm to 6mm, but she now has increased liver lesions.   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 We therefore obtained a liver biopsy to confirm the diagnosis and this is positive for metastatic breast cancer, ER positive and HER2 negative. 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  Her restaging scans now show moderate progression of her liver metastasis with innumerable lesions in both lobes including multiple new lesions. Fortunately she is asymptomatic from this and her liver function tests are essentially normal. We will therefore stop the Orserdu and change her to Faslodex injections 500mg  monthly along with Verzenio 100mg  BID. We are starting at a reduced dose of this due to her advanced age and multiple prior treatments.    Malignant neoplasm of upper-outer quadrant of left female breast [facility]) Remote history of stage IIIA hormone receptor positive breast cancer diagnosed in June 2007, treated with surgery, chemotherapy, radiation and 10 years of adjuvant endocrine therapy.  She had recurrent disease found in December 2022.   Malignant Neoplasm Metastatic to Bone On PET scans she has multiple bone lesions including the right 11th rib, left iliac bone, and left sacrum. She also has metastases noted in muscle tissue, bilateral paraspinal musculature. She is now on [place]  every 4 weeks. Fortunately she is asymptomatic from her bone metastasis.   Osteopenia Osteopenia, for which she has been receiving denosumab every 6 months, next due in September.  She continues calcium and vitamin-D as recommended.  She had a repeat bone density in December, 2023, which is stable.   Her last dose was given in September. She bolus changed to monthly injections due to her bone metastases.    Lymphedema This is 1+ of her left upper extremity, noted since 2023.  I gave her some literature about the diagnosis of lymphedema.  I find no adenopathy or tumor of the axilla.     Plan  She was placed on anastrozole and Palbociclib in January, 2023 for over 1 year. 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 Repeat scans in June, 2024 once again showed progression so she has been changed to faslodex injections monthly and Verzenio oral chemotherapy daily.  Patient states that She feels well and complains of chronic right knee pain due to arthritis. She has had diarrhea about once weekly which subsided with imodium. This is mostly due to her medication. She also had constipation multiple times. She has had one episode of dizziness which felt like vertigo. Her day 1 cycle 2 of Faslodex is scheduled for 05/02/2023.  Her labs are pending today. She is on Verzenio 100 mg twice a day. I will see her back in 2 weeks with CBC and CMP. The patient understands the plans discussed today and is in agreement with them.  I reviewed all of this information with her today and answered her questions.  She knows to contact our office if she develops concerns prior to her next appointment.  I provided 25 minutes of face-to-face time during this encounter and > 50% was spent counseling as documented under my assessment and plan.      Dellia Beckwith, MD  [facility] AT [facility] [address] [place] [address] Dept: [phone] Dept Fax: [phone]   No orders of the defined types were placed in this encounter.    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 She was treated with lumpectomy.  Pathology revealed a 1.5 cm, grade 1, invasive ductal carcinoma with 5 positive nodes.  Estrogen and progesterone receptors were positive and Her-2 Neu negative.  She received adjuvant chemotherapy with Adriamycin/Cytoxan for 3 months, followed by Taxol for 3 months.  She then received adjuvant radiation to the left breast.  She was placed on letrozole in April 2008 and completed 10 years of adjuvant hormonal therapy in April 2018.  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   Repeat bone density in September 2017 revealed slight improvement in the bone density, with a T-score of -2.1 in the forearm and a T-score of -1.1 in the femur with, so she has continued denosumab every 6 months, in addition to calcium/vitamin D.  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    She had reversal of her colostomy in October 2017.  She has an incisional hernia in the abdomen, which does not bother her, so she has decided against surgical repair.  She has atrial fibrillation and is on apixaban 5 mg twice daily.  Bone density scan in September 2019 revealed improvement in the bone density with a T-score of -1.6 in forearm.  The bone density of the femur had normalized with a T-score of -0.8.  She has continued denosumab every 6 months.  Annual bilateral mammogram in December 2020 did not reveal any evidence of malignancy.  She contracted COVID-19 later in December 2020.  She did not have to be hospitalized and fully recovered.  Bone density from September  2021 revealed mildly worsened osteopenia with a T-score of -2.0 of the left forearm radius, previously -1.6.  Left femur neck is normal at -0.9, previously -0.8.  She continues calcium 1200 mg with vitamin-D daily.   She presented to the emergency department in December 2022 due to shortness of breath that had been ongoing for the past 3 weeks. She was also having some left substernal area pain. ECHO revealed a normal EF between 60-65%. CT chest revealed right middle and lower lobe collapse with large right pleural effusion. There was no central obstructing mass lesion evident, or obvious right-sided pleural disease, although there is a irregular focus of soft tissue attenuation along the medial right upper lobe pleura/right anterior mediastinum. There were scattered tiny bilateral pulmonary nodules measuring up to 4 mm. Thoracentesis was pursued yielding 1200 cc's of pleural fluid. Cytopathology confirmed malignant cells consistent with breast primary. CKAE1/3, GATA-3 and ER positive. HER2 was negative 1+. Estrogen receptor was positive at 90% and progesterone receptor was positive at 20%. Ki67 was <1%. MRI brain was negative for evidence of metastasis. PET imaging from January 26th revealed no hypermetabolic activity to localize active metastatic breast carcinoma. Large right pleural effusion occupies 75% of the right hemithorax. Along the anteromedial margin of the right diaphragm there is hypermetabolic linear thickening of the diaphragm.  Oncology History  Malignant neoplasm of upper-outer quadrant of left female breast (HCC)  03/07/2006 Initial Diagnosis   Breast cancer, left breast (HCC)   03/17/2006 Cancer Staging   Staging form: Breast, AJCC 6th Edition - Clinical stage from 03/17/2006: Stage IIIA (T1c, N2a, M0) - Signed by Dellia Beckwith, MD on 12/15/2020 Staged by: Managing physician Diagnostic confirmation: Positive histology Specimen type: Excision Histopathologic type: Infiltrating duct  carcinoma, NOS Tumor size (mm): 15 Laterality: Left Total positive nodes: 5 Histologic grade (G): G1 Residual tumor (R): R0 - None Stage prefix: Initial diagnosis Lymphatic vessel invasion (L): L0 - No lymphatic vessel invasion Venous invasion (V): V0 - No venous invasion Prognostic indicators: ER/PR pos, HER 2 neg., Rx with AC x 3 mo, then Taxol x 3 mo.,AI x 10 yrs   04/04/2023 -  Chemotherapy   Patient is on Treatment Plan : BREAST Fulvestrant q28d     Malignant neoplasm metastatic to bone (HCC)  11/08/2022 Initial Diagnosis   Malignant neoplasm metastatic to bone (HCC)   04/04/2023 -  Chemotherapy   Patient is on Treatment Plan : BREAST Fulvestrant q28d         INTERVAL HISTORY:  Carrie Wilkerson is here today for repeat clinical assessment of recurrent hormone receptor positive breast cancer metastatic to liver, bone and lung/pleura which was found in December, 2022. She was placed on  anastreozole and Palbociclib in January, 2023 for over 1 year. 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 Repeat scans in June, 2024 once again showed progression so she has been changed to faslodex injection smonthly and Verzenio oral chemotherapy at 100 mg twice daily.  Patient states that She feels well and complains of chronic right knee pain due to arthritis. She has had diarrhea about once weekly which subsided with imodium. This is mostly due to her medication. She also had constipation multiple times. She has had one episode of dizziness which felt like vertigo. Her day 1 cycle 2 of Faslodex is scheduled for 05/02/2023.  Her labs are pending today.  I will see her  back in 2 weeks with CBC and CMP. She  denies signs of infections such as sore throat, sinus drainage, cough or urinary symptoms. She  denies fever or recurrent chills. She  also deny nausea, vomiting, chest pain, or dyspnea. Her   appetite is good and Her  weight has been stable     REVIEW OF SYSTEMS:  Review of Systems  Constitutional: Negative.  Negative for appetite change, chills, diaphoresis, fatigue, fever and unexpected weight change.  HENT:  Negative.  Negative for hearing loss, lump/mass, mouth sores, nosebleeds, sore throat, tinnitus, trouble swallowing and voice change.   Eyes: Negative.  Negative for eye problems and icterus.  Respiratory: Negative.  Negative for chest tightness, cough, hemoptysis, shortness of breath and wheezing.   Cardiovascular: Negative.  Negative for chest pain, leg swelling and palpitations.  Gastrointestinal:  Positive for constipation and diarrhea. Negative for abdominal distention, abdominal pain, blood in stool, nausea, rectal pain and vomiting.  Endocrine: Negative.  Negative for hot flashes.  Genitourinary:  Negative for bladder incontinence, difficulty urinating, dyspareunia, dysuria, frequency, hematuria, menstrual problem, nocturia, pelvic pain, vaginal bleeding and vaginal discharge.   Musculoskeletal:  Positive for arthralgias (chronic arthritic knee pain). Negative for back pain, flank pain, gait problem, myalgias, neck pain and neck stiffness.  Skin: Negative.  Negative for itching, rash and wound.  Neurological:  Positive for dizziness. Negative for extremity weakness, gait problem, headaches, light-headedness, numbness, seizures and speech difficulty.  Hematological: Negative.  Negative for adenopathy. Does not bruise/bleed easily.  Psychiatric/Behavioral: Negative.  Negative for confusion, decreased concentration, depression, sleep disturbance and suicidal ideas. The patient is not nervous/anxious.   All other systems reviewed and are negative.    VITALS:  Blood pressure 121/63, pulse 61, temperature (!) 97.4 F (36.3 C), temperature source Oral, resp. rate 18, height 5\' 1"  (1.549 m), weight 153 lb 3.2 oz (69.5 kg), SpO2 100%.  Wt Readings from Last 3 Encounters:   05/02/23 148 lb (67.1 kg)  04/30/23 153 lb 3.2 oz (69.5 kg)  04/16/23 153 lb 12.8 oz (69.8 kg)    Body mass index is 28.95 kg/m.  Performance status (ECOG): 0 - Asymptomatic  PHYSICAL EXAM:  Physical Exam Vitals and nursing note reviewed. Exam conducted with a chaperone present.  Constitutional:      General: She is not in acute distress.    Appearance: Normal appearance. She is normal weight. She is not ill-appearing, toxic-appearing or diaphoretic.  HENT:     Head: Normocephalic and atraumatic.     Right Ear: Tympanic membrane, ear canal and external ear normal. There is no impacted cerumen.     Left Ear: Tympanic membrane, ear canal and external ear  normal. There is no impacted cerumen.     Nose: Nose normal. No congestion or rhinorrhea.     Mouth/Throat:     Mouth: Mucous membranes are moist.     Pharynx: Oropharynx is clear. No oropharyngeal exudate or posterior oropharyngeal erythema.  Eyes:     General: No scleral icterus.       Right eye: No discharge.        Left eye: No discharge.     Extraocular Movements: Extraocular movements intact.     Conjunctiva/sclera: Conjunctivae normal.     Pupils: Pupils are equal, round, and reactive to light.  Neck:     Vascular: No carotid bruit.  Cardiovascular:     Rate and Rhythm: Normal rate and regular rhythm.     Pulses: Normal pulses.     Heart sounds: Normal heart sounds. No murmur heard.    No friction rub. No gallop.  Pulmonary:     Effort: Pulmonary effort is normal. No respiratory distress.     Breath sounds: No stridor. Examination of the right-lower field reveals decreased breath sounds. Examination of the left-lower field reveals decreased breath sounds. Decreased breath sounds present. No wheezing, rhonchi or rales.  Chest:     Chest wall: No tenderness.  Abdominal:     General: Bowel sounds are normal. There is no distension.     Palpations: Abdomen is soft. There is no hepatomegaly, splenomegaly or mass.      Tenderness: There is no abdominal tenderness. There is no right CVA tenderness, left CVA tenderness, guarding or rebound.     Hernia: A hernia is present.     Comments: Chronic upper abdominal ventral wall hernia.   Musculoskeletal:        General: No swelling, tenderness, deformity or signs of injury. Normal range of motion.     Cervical back: Normal range of motion and neck supple. No rigidity or tenderness.     Right knee: Crepitus present.     Left knee: Crepitus present.     Right lower leg: No edema.     Left lower leg: No edema.     Comments: 1+ lymphedema of the left upper extremity    Lymphadenopathy:     Cervical: No cervical adenopathy.     Right cervical: No superficial, deep or posterior cervical adenopathy.    Left cervical: No superficial, deep or posterior cervical adenopathy.     Upper Body:     Right upper body: No supraclavicular, axillary or pectoral adenopathy.     Left upper body: No supraclavicular, axillary or pectoral adenopathy.  Skin:    General: Skin is warm and dry.     Coloration: Skin is not jaundiced or pale.     Findings: No bruising, erythema, lesion or rash.  Neurological:     General: No focal deficit present.     Mental Status: She is alert and oriented to person, place, and time. Mental status is at baseline.     Cranial Nerves: No cranial nerve deficit.     Sensory: No sensory deficit.     Motor: No weakness.     Coordination: Coordination normal.     Gait: Gait normal.     Deep Tendon Reflexes: Reflexes normal.  Psychiatric:        Mood and Affect: Mood normal.        Behavior: Behavior normal.        Thought Content: Thought content normal.  Judgment: Judgment normal.    LABS:      Latest Ref Rng & Units 04/30/2023    1:05 PM 04/16/2023   12:00 AM 03/31/2023   12:00 AM  CBC  WBC 4.0 - 10.5 K/uL 3.4  4.2     5.1      Hemoglobin 12.0 - 15.0 g/dL 16.1  09.6     04.5      Hematocrit 36.0 - 46.0 % 37.0  39     40      Platelets  150 - 400 K/uL 130  198     210         This result is from an external source.      Latest Ref Rng & Units 04/30/2023    1:05 PM 04/16/2023   12:00 AM 03/31/2023   12:00 AM  CMP  Glucose 70 - 99 mg/dL 85     BUN 8 - 23 mg/dL 14  23     15       Creatinine 0.44 - 1.00 mg/dL 4.09  1.0     0.7      Sodium 135 - 145 mmol/L 139  138     139      Potassium 3.5 - 5.1 mmol/L 4.2  4.6     4.1      Chloride 98 - 111 mmol/L 105   106      CO2 22 - 32 mmol/L 25  28     26       Calcium 8.9 - 10.3 mg/dL 9.1  9.6     9.5      Total Protein 6.5 - 8.1 g/dL 6.4     Total Bilirubin 0.3 - 1.2 mg/dL 0.3     Alkaline Phos 38 - 126 U/L 37  43     48      AST 15 - 41 U/L 21  30     39      ALT 0 - 44 U/L 15  12     20          This result is from an external source.   Component Ref Range & Units 03/05/2023 4 yr ago  TSH 0.350 - 4.500 uIU/mL 2.472 1.890 R   Lab Results  Component Value Date   CEA1 1.6 10/11/2021   /  CEA  Date Value Ref Range Status  10/11/2021 1.6 0.0 - 4.7 ng/mL Final    Comment:    (NOTE)                             Nonsmokers          <3.9                             Smokers             <5.6 Roche [employer] Electrochemiluminescence Immunoassay (ECLIA) Values obtained with different assay methods or kits cannot be used interchangeably.  Results cannot be interpreted as absolute evidence of the presence or absence of malignant disease. Performed At: [facility] [address] [place], [place] [id number] Jolene Schimke MD NF:[id number]    Component Ref Range & Units 03/05/2023 6 mo ago 4 yr ago  Cholesterol 0 - 200 mg/dL 469 High  629   Triglycerides <150 mg/dL 528 High  413 244 High  R  HDL >40 mg/dL 51 44  43 R  Total CHOL/HDL Ratio RATIO 4.4 4.5 4.0 R, CM  VLDL 0 - 40 mg/dL 36 29   LDL Cholesterol 0 - 99 mg/dL 562 High  130 High  CM 86     No results found for: "PSA1" No results found for: "QMV784" No results found for: "CAN125"  No results  found for: "TOTALPROTELP", "ALBUMINELP", "A1GS", "A2GS", "BETS", "BETA2SER", "GAMS", "MSPIKE", "SPEI" No results found for: "TIBC", "FERRITIN", "IRONPCTSAT" No results found for: "LDH"  STUDIES:    Exam: 03/31/2023 CT Chest, Abdomen, and Pelvis with Contrast Impression: Moderate progression of hepatic metastasis, as evidence by development of innumerable bilobar lesions and enlargement of previously detailed lesions.  a mild enlargement of moderate right pleural effusion. New tiny left pleural effusions.  Similar right middle lobe pulmonary nodule.     Exam: 11/07/2022 Nuclear Medicine PET skull Base to Thigh Impression: Widespread metastatic disease. Findings could be due to metastatic breast cancer or lung cancer. There is a sizable right lower lobe/right infrahilar mass which is more suggestive of a recurrent lung cancer with associated mediastinal and hilar adenopathy. Metastatic pulmonary and pleural nodules, metastatic hepatic and osseous lesions and muscle metastasis. Large right pleural effusion is likely malignant.  Other incidental findings as detailed.         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  Report date is 11/29/21.   The CustomNext-Cancer+RNAinsight panel offered by [employer] [facility] includes sequencing and rearrangement analysis for the following 47 genes:  APC, ATM, AXIN2, BARD1, BMPR1A, BRCA1,  BRCA2, BRIP1, CDH1, CDK4, CDKN2A, CHEK2, DICER1, EPCAM, GREM1, HOXB13,    History of left breast cancer 06/13/2021   Hypotension 06/13/2021   Incisional hernia, without obstruction or gangrene 10/22/2016   Malignant neoplasm of upper-outer quadrant of left female breast (HCC) 03/07/2006   Mixed dyslipidemia 11/03/2018   Numbness in right leg    Hx: of   Obesity (BMI 30.0-34.9) 09/21/2020   Other specified disorders of bone density and structure, multiple sites 09/20/2020   Paroxysmal atrial fibrillation (HCC) 02/13/2016   Personal history of COVID-19 09/2019   Pleural effusion, 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   Pneumonia    Hx: of 'a long time ago"   Postoperative examination 02/05/2016    Past Surgical History:  Procedure Laterality Date   APPENDECTOMY     BACK SURGERY     BREAST SURGERY     Hx: of lumpectomy left breast 2007   COLONOSCOPY     Hx: of   DILATION AND CURETTAGE OF UTERUS     TONSILLECTOMY     TOTAL HIP ARTHROPLASTY Right 04/13/2013   Dr Magnus Ivan   TOTAL HIP ARTHROPLASTY Right 04/13/2013   Procedure: RIGHT TOTAL HIP ARTHROPLASTY ANTERIOR APPROACH;  Surgeon: Kathryne Hitch, MD;  Location: [facility] OR;  Service: Orthopedics;  Laterality: Right;   TUBAL LIGATION      Family History  Problem Relation Age of Onset   Hypertension Sister  Cancer Sister 57       uterine or ovarian   Bone cancer Sister 31   Breast cancer Sister        dx 61 and 67   Uterine cancer Sister 41   Colon cancer Sister 74   Breast cancer Sister 81   Hypertension Brother    Kidney cancer Brother 12   Cancer Brother 72       unknown type; mets   Liver cancer Brother    Liver cancer Brother 28   Pancreatic cancer Niece 41   Breast cancer Niece 49   Colon cancer Nephew 57       mets    Social History:  reports that she has never smoked. She has never used smokeless tobacco. She reports that she does not drink alcohol and does not use drugs.The patient is alone today.  Allergies:  Allergies   Allergen Reactions   Codeine     unknown    Sulfa Antibiotics     unknown   Sulfacetamide Sodium Other (See Comments)    unknown   Chlorhexidine Rash    Current Medications: Current Outpatient Medications  Medication Sig Dispense Refill   abemaciclib (VERZENIO) 100 MG tablet Take 1 tablet (100 mg total) by mouth 2 (two) times daily. 56 tablet 0   apixaban (ELIQUIS) 5 MG TABS tablet TAKE 1 TABLET BY MOUTH TWICE A DAY 180 tablet 1   atorvastatin (LIPITOR) 10 MG tablet Take 1 tablet by mouth every other day.  4   benzonatate (TESSALON) 100 MG capsule Take 1 capsule (100 mg total) by mouth 3 (three) times daily as needed for cough. (Patient not taking: Reported on 04/16/2023) 20 capsule 5   calcium carbonate (OSCAL) 1500 (600 Ca) MG TABS tablet Take 600 mg of elemental calcium by mouth daily with breakfast.     cholecalciferol (VITAMIN D) 1000 UNITS tablet Take 1,000 Units by mouth daily.     denosumab (XGEVA) 120 MG/1.7ML SOLN injection Inject 120 mg into the skin as directed. Once every 4 weeks     Flaxseed, Linseed, OIL Take 1 capsule by mouth daily.     Fulvestrant (FASLODEX IM) Inject into the muscle. 500 mg injection every 4 weeks     glucosamine-chondroitin 500-400 MG tablet Take 1 tablet by mouth 2 (two) times daily.      levothyroxine (SYNTHROID, LEVOTHROID) 75 MCG tablet Take 75 mcg by mouth daily.   7   metoprolol tartrate (LOPRESSOR) 25 MG tablet Take 1 tablet (25 mg total) by mouth 2 (two) times daily. Patient needs appointment for further refills. 1 st attempt 60 tablet 0   Multiple Vitamin (MULTIVITAMIN WITH MINERALS) TABS Take 1 tablet by mouth daily.     ondansetron (ZOFRAN) 4 MG tablet Take 1 tablet (4 mg total) by mouth every 4 (four) hours as needed for nausea or vomiting. 30 tablet 5   prochlorperazine (COMPAZINE) 10 MG tablet Take 1 tablet (10 mg total) by mouth every 6 (six) hours as needed for nausea or vomiting. 30 tablet 5   No current facility-administered  medications for this visit.     I,Oluwatobi Asade,acting as a scribe for Dellia Beckwith, MD.,have documented all relevant documentation on the behalf of Dellia Beckwith, MD,as directed by  Dellia Beckwith, MD while in the presence of Dellia Beckwith, MD.

## 2023-04-30 ENCOUNTER — Encounter: Payer: Self-pay | Admitting: Oncology

## 2023-04-30 ENCOUNTER — Inpatient Hospital Stay: Payer: Medicare HMO

## 2023-04-30 ENCOUNTER — Inpatient Hospital Stay (INDEPENDENT_AMBULATORY_CARE_PROVIDER_SITE_OTHER): Payer: Medicare HMO | Admitting: Oncology

## 2023-04-30 VITALS — BP 121/63 | HR 61 | Temp 97.4°F | Resp 18 | Ht 61.0 in | Wt 153.2 lb

## 2023-04-30 DIAGNOSIS — C50412 Malignant neoplasm of upper-outer quadrant of left female breast: Secondary | ICD-10-CM

## 2023-04-30 DIAGNOSIS — J910 Malignant pleural effusion: Secondary | ICD-10-CM

## 2023-04-30 DIAGNOSIS — Z170 Estrogen receptor positive status [ER+]: Secondary | ICD-10-CM | POA: Diagnosis not present

## 2023-04-30 DIAGNOSIS — C7951 Secondary malignant neoplasm of bone: Secondary | ICD-10-CM | POA: Diagnosis not present

## 2023-04-30 DIAGNOSIS — C787 Secondary malignant neoplasm of liver and intrahepatic bile duct: Secondary | ICD-10-CM | POA: Diagnosis not present

## 2023-04-30 DIAGNOSIS — Z5111 Encounter for antineoplastic chemotherapy: Secondary | ICD-10-CM | POA: Diagnosis not present

## 2023-04-30 DIAGNOSIS — J90 Pleural effusion, not elsewhere classified: Secondary | ICD-10-CM | POA: Diagnosis not present

## 2023-04-30 LAB — CBC WITH DIFFERENTIAL (CANCER CENTER ONLY)
Abs Immature Granulocytes: 0.01 10*3/uL (ref 0.00–0.07)
Basophils Absolute: 0.1 10*3/uL (ref 0.0–0.1)
Basophils Relative: 2 %
Eosinophils Absolute: 0 10*3/uL (ref 0.0–0.5)
Eosinophils Relative: 1 %
HCT: 37 % (ref 36.0–46.0)
Hemoglobin: 12.1 g/dL (ref 12.0–15.0)
Immature Granulocytes: 0 %
Lymphocytes Relative: 45 %
Lymphs Abs: 1.5 10*3/uL (ref 0.7–4.0)
MCH: 31.4 pg (ref 26.0–34.0)
MCHC: 32.7 g/dL (ref 30.0–36.0)
MCV: 96.1 fL (ref 80.0–100.0)
Monocytes Absolute: 0.2 10*3/uL (ref 0.1–1.0)
Monocytes Relative: 6 %
Neutro Abs: 1.5 10*3/uL — ABNORMAL LOW (ref 1.7–7.7)
Neutrophils Relative %: 46 %
Platelet Count: 130 10*3/uL — ABNORMAL LOW (ref 150–400)
RBC: 3.85 MIL/uL — ABNORMAL LOW (ref 3.87–5.11)
RDW: 13.8 % (ref 11.5–15.5)
WBC Count: 3.4 10*3/uL — ABNORMAL LOW (ref 4.0–10.5)
nRBC: 0 % (ref 0.0–0.2)

## 2023-04-30 LAB — CMP (CANCER CENTER ONLY)
ALT: 15 U/L (ref 0–44)
AST: 21 U/L (ref 15–41)
Albumin: 3.2 g/dL — ABNORMAL LOW (ref 3.5–5.0)
Alkaline Phosphatase: 37 U/L — ABNORMAL LOW (ref 38–126)
Anion gap: 9 (ref 5–15)
BUN: 14 mg/dL (ref 8–23)
CO2: 25 mmol/L (ref 22–32)
Calcium: 9.1 mg/dL (ref 8.9–10.3)
Chloride: 105 mmol/L (ref 98–111)
Creatinine: 1.05 mg/dL — ABNORMAL HIGH (ref 0.44–1.00)
GFR, Estimated: 52 mL/min — ABNORMAL LOW (ref >60)
Glucose, Bld: 85 mg/dL (ref 70–99)
Potassium: 4.2 mmol/L (ref 3.5–5.1)
Sodium: 139 mmol/L (ref 135–145)
Total Bilirubin: 0.3 mg/dL (ref 0.3–1.2)
Total Protein: 6.4 g/dL — ABNORMAL LOW (ref 6.5–8.1)

## 2023-05-02 ENCOUNTER — Inpatient Hospital Stay: Payer: Medicare HMO

## 2023-05-02 VITALS — BP 132/64 | HR 77 | Temp 98.1°F | Resp 18 | Ht 61.0 in | Wt 148.0 lb

## 2023-05-02 DIAGNOSIS — C7951 Secondary malignant neoplasm of bone: Secondary | ICD-10-CM

## 2023-05-02 DIAGNOSIS — Z5111 Encounter for antineoplastic chemotherapy: Secondary | ICD-10-CM | POA: Diagnosis not present

## 2023-05-02 DIAGNOSIS — C50412 Malignant neoplasm of upper-outer quadrant of left female breast: Secondary | ICD-10-CM | POA: Diagnosis not present

## 2023-05-02 DIAGNOSIS — Z170 Estrogen receptor positive status [ER+]: Secondary | ICD-10-CM | POA: Diagnosis not present

## 2023-05-02 DIAGNOSIS — C787 Secondary malignant neoplasm of liver and intrahepatic bile duct: Secondary | ICD-10-CM | POA: Diagnosis not present

## 2023-05-02 DIAGNOSIS — J90 Pleural effusion, not elsewhere classified: Secondary | ICD-10-CM | POA: Diagnosis not present

## 2023-05-02 MED ORDER — FULVESTRANT 250 MG/5ML IM SOSY
500.0000 mg | PREFILLED_SYRINGE | Freq: Once | INTRAMUSCULAR | Status: AC
  Administered 2023-05-02: 500 mg via INTRAMUSCULAR
  Filled 2023-05-02: qty 10

## 2023-05-02 MED ORDER — DENOSUMAB 120 MG/1.7ML ~~LOC~~ SOLN
120.0000 mg | Freq: Once | SUBCUTANEOUS | Status: AC
  Administered 2023-05-02: 120 mg via SUBCUTANEOUS
  Filled 2023-05-02: qty 1.7

## 2023-05-02 NOTE — Patient Instructions (Signed)
Denosumab Injection (Oncology) What is this medication? DENOSUMAB (den oh SUE mab) prevents weakened bones caused by cancer. It may also be used to treat noncancerous bone tumors that cannot be removed by surgery. It can also be used to treat high calcium levels in the blood caused by cancer. It works by blocking a protein that causes bones to break down quickly. This slows down the release of calcium from bones, which lowers calcium levels in your blood. It also makes your bones stronger and less likely to break (fracture). This medicine may be used for other purposes; ask your health care provider or pharmacist if you have questions. COMMON BRAND NAME(S): XGEVA What should I tell my care team before I take this medication? They need to know if you have any of these conditions: Dental disease Having surgery or tooth extraction Infection Kidney disease Low levels of calcium or vitamin D in the blood Malnutrition On hemodialysis Skin conditions or sensitivity Thyroid or parathyroid disease An unusual reaction to denosumab, other medications, foods, dyes, or preservatives Pregnant or trying to get pregnant Breast-feeding How should I use this medication? This medication is for injection under the skin. It is given by your care team in a hospital or clinic setting. A special MedGuide will be given to you before each treatment. Be sure to read this information carefully each time. Talk to your care team about the use of this medication in children. While it may be prescribed for children as young as 13 years for selected conditions, precautions do apply. Overdosage: If you think you have taken too much of this medicine contact a poison control center or emergency room at once. NOTE: This medicine is only for you. Do not share this medicine with others. What if I miss a dose? Keep appointments for follow-up doses. It is important not to miss your dose. Call your care team if you are unable to  keep an appointment. What may interact with this medication? Do not take this medication with any of the following: Other medications containing denosumab This medication may also interact with the following: Medications that lower your chance of fighting infection Steroid medications, such as prednisone or cortisone This list may not describe all possible interactions. Give your health care provider a list of all the medicines, herbs, non-prescription drugs, or dietary supplements you use. Also tell them if you smoke, drink alcohol, or use illegal drugs. Some items may interact with your medicine. What should I watch for while using this medication? Your condition will be monitored carefully while you are receiving this medication. You may need blood work while taking this medication. This medication may increase your risk of getting an infection. Call your care team for advice if you get a fever, chills, sore throat, or other symptoms of a cold or flu. Do not treat yourself. Try to avoid being around people who are sick. You should make sure you get enough calcium and vitamin D while you are taking this medication, unless your care team tells you not to. Discuss the foods you eat and the vitamins you take with your care team. Some people who take this medication have severe bone, joint, or muscle pain. This medication may also increase your risk for jaw problems or a broken thigh bone. Tell your care team right away if you have severe pain in your jaw, bones, joints, or muscles. Tell your care team if you have any pain that does not go away or that gets worse. Talk   to your care team if you may be pregnant. Serious birth defects can occur if you take this medication during pregnancy and for 5 months after the last dose. You will need a negative pregnancy test before starting this medication. Contraception is recommended while taking this medication and for 5 months after the last dose. Your care team  can help you find the option that works for you. What side effects may I notice from receiving this medication? Side effects that you should report to your care team as soon as possible: Allergic reactions--skin rash, itching, hives, swelling of the face, lips, tongue, or throat Bone, joint, or muscle pain Low calcium level--muscle pain or cramps, confusion, tingling, or numbness in the hands or feet Osteonecrosis of the jaw--pain, swelling, or redness in the mouth, numbness of the jaw, poor healing after dental work, unusual discharge from the mouth, visible bones in the mouth Side effects that usually do not require medical attention (report to your care team if they continue or are bothersome): Cough Diarrhea Fatigue Headache Nausea This list may not describe all possible side effects. Call your doctor for medical advice about side effects. You may report side effects to FDA at 1-800-FDA-1088. Where should I keep my medication? This medication is given in a hospital or clinic. It will not be stored at home. NOTE: This sheet is a summary. It may not cover all possible information. If you have questions about this medicine, talk to your doctor, pharmacist, or health care provider.  2024 Elsevier/Gold Standard (2022-02-13 00:00:00) Fulvestrant Injection What is this medication? FULVESTRANT (ful VES trant) treats breast cancer. It works by blocking the hormone estrogen in breast tissue, which prevents breast cancer cells from spreading or growing. This medicine may be used for other purposes; ask your health care provider or pharmacist if you have questions. COMMON BRAND NAME(S): FASLODEX What should I tell my care team before I take this medication? They need to know if you have any of these conditions: Bleeding disorder Liver disease Low blood cell levels, such as low white cells, red cells, and platelets An unusual or allergic reaction to fulvestrant, other medications, foods, dyes, or  preservatives Pregnant or trying to get pregnant Breast-feeding How should I use this medication? This medication is injected into a muscle. It is given by your care team in a hospital or clinic setting. Talk to your care team about the use of this medication in children. Special care may be needed. Overdosage: If you think you have taken too much of this medicine contact a poison control center or emergency room at once. NOTE: This medicine is only for you. Do not share this medicine with others. What if I miss a dose? Keep appointments for follow-up doses. It is important not to miss your dose. Call your care team if you are unable to keep an appointment. What may interact with this medication? Certain medications that prevent or treat blood clots, such as warfarin, enoxaparin, dalteparin, apixaban, dabigatran, rivaroxaban This list may not describe all possible interactions. Give your health care provider a list of all the medicines, herbs, non-prescription drugs, or dietary supplements you use. Also tell them if you smoke, drink alcohol, or use illegal drugs. Some items may interact with your medicine. What should I watch for while using this medication? Your condition will be monitored carefully while you are receiving this medication. You may need blood work while taking this medication. Talk to your care team if you may be pregnant. Serious   birth defects can occur if you take this medication during pregnancy and for 1 year after the last dose. You will need a negative pregnancy test before starting this medication. Contraception is recommended while taking this medication and for 1 year after the last dose. Your care team can help you find the option that works for you. Do not breastfeed while taking this medication and for 1 year after the last dose. This medication may cause infertility. Talk to your care team if you are concerned about your fertility. What side effects may I notice from  receiving this medication? Side effects that you should report to your care team as soon as possible: Allergic reactions or angioedema--skin rash, itching or hives, swelling of the face, eyes, lips, tongue, arms, or legs, trouble swallowing or breathing Pain, tingling, or numbness in the hands or feet Side effects that usually do not require medical attention (report to your care team if they continue or are bothersome): Bone, joint, or muscle pain Constipation Headache Hot flashes Nausea Pain, redness, or irritation at injection site Unusual weakness or fatigue This list may not describe all possible side effects. Call your doctor for medical advice about side effects. You may report side effects to FDA at 1-800-FDA-1088. Where should I keep my medication? This medication is given in a hospital or clinic. It will not be stored at home. NOTE: This sheet is a summary. It may not cover all possible information. If you have questions about this medicine, talk to your doctor, pharmacist, or health care provider.  2024 Elsevier/Gold Standard (2022-02-05 00:00:00)  

## 2023-05-03 ENCOUNTER — Other Ambulatory Visit: Payer: Self-pay | Admitting: Cardiology

## 2023-05-05 ENCOUNTER — Other Ambulatory Visit: Payer: Self-pay | Admitting: Cardiology

## 2023-05-05 NOTE — Telephone Encounter (Signed)
Prescription refill request for Eliquis received. Indication: PAF Last office visit: 04/24/22  R Revankar MD  (Has appt 06/18/23) Scr: 1.0 on 04/16/23  Epic Age: 82 Weight: 70.9kg  Based on above findings Eliquis 5mg  twice daily is the appropriate dose.  Refill approved.

## 2023-05-06 ENCOUNTER — Other Ambulatory Visit: Payer: Self-pay

## 2023-05-12 ENCOUNTER — Telehealth: Payer: Self-pay

## 2023-05-12 NOTE — Telephone Encounter (Signed)
I spoke with pt. I asked how she was feeling, she paused & then said "I'm ok". I asked her what was she feeling. She replied, "Well yesterday my head didn't feel right". I asked if she meant she was dizzy? She replied,Yes, but it got better". I reminded pt to drink at least 64oz fluid daily to prevent dehydration. She is taking her Verzenio w/breakfast & supper. 1 missed dose. She continues to have SOB on exertion, which is baseline for her. Denies cough, chills, fever, N/V, chest pain, skin rash,& headache. She has diarrhea about once per day per pt. She does not require Imodium. I reminded her to call us if she develops temp of 100.4 or higher day or night. She verbalized understanding. We confirmed her appt for this week.

## 2023-05-13 ENCOUNTER — Encounter: Payer: Self-pay | Admitting: Oncology

## 2023-05-14 ENCOUNTER — Other Ambulatory Visit (HOSPITAL_COMMUNITY): Payer: Self-pay

## 2023-05-14 ENCOUNTER — Inpatient Hospital Stay: Payer: Medicare HMO

## 2023-05-14 ENCOUNTER — Inpatient Hospital Stay: Payer: Medicare HMO | Attending: Hematology and Oncology

## 2023-05-14 DIAGNOSIS — Z79899 Other long term (current) drug therapy: Secondary | ICD-10-CM | POA: Insufficient documentation

## 2023-05-14 DIAGNOSIS — Z170 Estrogen receptor positive status [ER+]: Secondary | ICD-10-CM

## 2023-05-14 DIAGNOSIS — C787 Secondary malignant neoplasm of liver and intrahepatic bile duct: Secondary | ICD-10-CM | POA: Diagnosis not present

## 2023-05-14 DIAGNOSIS — C7951 Secondary malignant neoplasm of bone: Secondary | ICD-10-CM | POA: Diagnosis not present

## 2023-05-14 DIAGNOSIS — Z5111 Encounter for antineoplastic chemotherapy: Secondary | ICD-10-CM | POA: Diagnosis not present

## 2023-05-14 DIAGNOSIS — C50412 Malignant neoplasm of upper-outer quadrant of left female breast: Secondary | ICD-10-CM | POA: Insufficient documentation

## 2023-05-14 LAB — CBC WITH DIFFERENTIAL (CANCER CENTER ONLY)
Abs Immature Granulocytes: 0.01 10*3/uL (ref 0.00–0.07)
Basophils Absolute: 0 10*3/uL (ref 0.0–0.1)
Basophils Relative: 1 %
Eosinophils Absolute: 0 10*3/uL (ref 0.0–0.5)
Eosinophils Relative: 2 %
HCT: 33.6 % — ABNORMAL LOW (ref 36.0–46.0)
Hemoglobin: 11.2 g/dL — ABNORMAL LOW (ref 12.0–15.0)
Immature Granulocytes: 0 %
Lymphocytes Relative: 49 %
Lymphs Abs: 1.3 10*3/uL (ref 0.7–4.0)
MCH: 31.4 pg (ref 26.0–34.0)
MCHC: 33.3 g/dL (ref 30.0–36.0)
MCV: 94.1 fL (ref 80.0–100.0)
Monocytes Absolute: 0.2 10*3/uL (ref 0.1–1.0)
Monocytes Relative: 7 %
Neutro Abs: 1.1 10*3/uL — ABNORMAL LOW (ref 1.7–7.7)
Neutrophils Relative %: 41 %
Platelet Count: 205 10*3/uL (ref 150–400)
RBC: 3.57 MIL/uL — ABNORMAL LOW (ref 3.87–5.11)
RDW: 14.4 % (ref 11.5–15.5)
WBC Count: 2.6 10*3/uL — ABNORMAL LOW (ref 4.0–10.5)
nRBC: 0 % (ref 0.0–0.2)

## 2023-05-14 LAB — CMP (CANCER CENTER ONLY)
ALT: 16 U/L (ref 0–44)
AST: 26 U/L (ref 15–41)
Albumin: 3.4 g/dL — ABNORMAL LOW (ref 3.5–5.0)
Alkaline Phosphatase: 45 U/L (ref 38–126)
Anion gap: 9 (ref 5–15)
BUN: 11 mg/dL (ref 8–23)
CO2: 24 mmol/L (ref 22–32)
Calcium: 9 mg/dL (ref 8.9–10.3)
Chloride: 98 mmol/L (ref 98–111)
Creatinine: 1.06 mg/dL — ABNORMAL HIGH (ref 0.44–1.00)
GFR, Estimated: 52 mL/min — ABNORMAL LOW (ref >60)
Glucose, Bld: 99 mg/dL (ref 70–99)
Potassium: 4.3 mmol/L (ref 3.5–5.1)
Sodium: 131 mmol/L — ABNORMAL LOW (ref 135–145)
Total Bilirubin: 0.4 mg/dL (ref 0.3–1.2)
Total Protein: 6.7 g/dL (ref 6.5–8.1)

## 2023-05-19 ENCOUNTER — Other Ambulatory Visit: Payer: Self-pay

## 2023-05-19 ENCOUNTER — Other Ambulatory Visit: Payer: Self-pay | Admitting: Oncology

## 2023-05-19 ENCOUNTER — Other Ambulatory Visit (HOSPITAL_COMMUNITY): Payer: Self-pay

## 2023-05-19 DIAGNOSIS — Z170 Estrogen receptor positive status [ER+]: Secondary | ICD-10-CM

## 2023-05-19 DIAGNOSIS — C787 Secondary malignant neoplasm of liver and intrahepatic bile duct: Secondary | ICD-10-CM

## 2023-05-19 MED ORDER — ABEMACICLIB 100 MG PO TABS
100.0000 mg | ORAL_TABLET | Freq: Two times a day (BID) | ORAL | 0 refills | Status: DC
  Filled 2023-05-19: qty 56, 28d supply, fill #0

## 2023-05-20 ENCOUNTER — Telehealth: Payer: Self-pay

## 2023-05-20 NOTE — Telephone Encounter (Addendum)
I spoke with pt to see if she was still having diarrhea episodes. Pt states, "the diarrhea is a lot better". I asked if she was able to get to the restroom in time? She replied, "most of the time I do. This morning I got up at 2 o'clock with diarrhea. That was the first though in a couple days". She isn't taking the Imodium daily as she felt like it was causing constipation. I reminded her to drink more fluid, at least a cup for each diarrhea episode, to prevent dehydration. She states, "I drink 3-4 16oz glasses a day". Pt reminded to call us if she develops temp or has any questions/concerns. I confirmed her next appt's.

## 2023-05-21 ENCOUNTER — Other Ambulatory Visit (HOSPITAL_COMMUNITY): Payer: Self-pay

## 2023-05-26 ENCOUNTER — Other Ambulatory Visit: Payer: Self-pay | Admitting: Cardiology

## 2023-05-27 NOTE — Progress Notes (Signed)
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  She initially had response with a decrease in the CA 27-29, as well as a decrease in the pleural effusion.  Unfortunately CT imaging in January revealed a new nodule in  the right middle lobe measuring 7mm, as well as new liver lesions. The  right pleural effusion remained stable.  PET scan revealed widespread disease with pulmonary and pleural nodules, hepatic metastasis and bone metastasis.  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  Repeat imaging in June unfortunately showed progressive metastasis within the liver.  She is now taking abemaciclib 100 mg twice daily with fulvestrant injections monthly started in June.  We are currently seeing her every 2 weeks for close monitoring.  So far, she is tolerating abemaciclib/fulvestrant well.  She knows to continue abemaciclib twice daily.  We will plan to see her back in 4 weeks with a CBC and comprehensive metabolic panel prior to her next fulvestrant and denosumab.  Malignant neoplasm metastatic to liver [facility]) Liver metastasis diagnosed in January 2024 confirmed by biopsy.  She was treated with Orsedu, but had progressive disease on imaging in June.  She is now taking abemaciclib 100 mg twice daily with fulvestrant injections monthly and tolerating this well.  Malignant neoplasm metastatic to bone [facility]) Bone metastasis diagnosed by PET in January, not seen on CT imaging.  She continues monthly denosumab.  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  She developed recurrent disease in December 2022 and has been through multiple lines of therapy.    The patient understands the plans discussed today and is in agreement with them.  She knows to contact our office if she develops concerns prior to her next appointment.   I provided 15 minutes of face-to-face time during this encounter and > 50% was spent counseling as documented under my assessment and plan.    [name], PA-C  [facility] AT [facility] [address] [place] [address] Dept: [phone] Dept Fax: [phone]   Orders Placed This Encounter  Procedures   Cancer antigen 27.29    Standing Status:   Future    Standing Expiration Date:   05/28/2024      CHIEF COMPLAINT:  CC: Recurrent hormone receptor positive breast cancer  Current Treatment: Abemaciclib/fulvestrant/denosumab  HISTORY OF PRESENT ILLNESS:  Carrie Wilkerson is a 82 y.o. 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 She was treated with lumpectomy.  Pathology revealed a 1.5 cm, grade 1, invasive ductal carcinoma with 5 positive nodes.  Estrogen and progesterone receptors were positive and Her-2 Neu negative.  She received adjuvant chemotherapy with Adriamycin/Cytoxan for 3 months, followed by Taxol for 3 months.  She then received adjuvant radiation to the left breast.  She was placed on letrozole in April 2008 and completed 10 years of adjuvant hormonal therapy in April 2018.  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   Repeat bone density in September 2017 revealed slight improvement in the bone density,  with a T-score of -2.1 in the forearm and a T-score of -1.1 in the femur with, so she has continued denosumab every 6 months, in addition to calcium/vitamin D.  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    She had reversal of her colostomy in October 2017.  She has an incisional hernia in the abdomen, which does not bother her, so she has decided against surgical repair.  She has atrial fibrillation and is on apixaban 5 mg twice daily.  Bone density scan in September 2019 revealed improvement in the bone density with a T-score of -1.6 in forearm.  The bone density of the femur had normalized with a T-score of -0.8.  She has continued denosumab every 6 months.  Annual bilateral mammogram in December 2020 did not reveal any evidence of malignancy.  She contracted COVID-19 later in December 2020.  She did not have to be hospitalized and fully recovered.  Bone density from September 2021 revealed mildly worsened osteopenia with a T-score of -2.0 of the left forearm radius, previously -1.6.  Left femur neck is normal at -0.9, previously -0.8.  She continued calcium 1200 mg with vitamin-D.   She presented to the emergency department in December 2022 due to shortness of breath and left substernal area pain. ECHO revealed a normal EF between 60-65%. CT chest revealed right middle and lower lobe collapse with large right pleural effusion. There was no central obstructing mass lesion evident, or obvious right-sided pleural disease, although there is a irregular focus of soft tissue attenuation along the medial right upper lobe pleura/right anterior mediastinum. There were scattered tiny bilateral pulmonary nodules measuring up to 4 mm. Thoracentesis was pursued yielding 1200 cc's of pleural fluid. Cytopathology confirmed malignant cells consistent with breast primary. CKAE1/3, GATA-3 and ER positive. HER2 was negative 1+. Estrogen receptor was positive at 90% and progesterone receptor was  positive at 20%. Ki67 was <1%. MRI brain was negative for evidence of metastasis. PET imaging in January 2023 revealed no hypermetabolic activity to localize active metastatic breast carcinoma. Large right pleural effusion occupies 75% of the right hemithorax. Along the anteromedial margin of the right diaphragm there is hypermetabolic linear thickening of the diaphragm. She had a 2nd thoracentesis in January.  She was placed on anastrozole and palbociclib 125 mg daily for 3 weeks on and 1 week off.  She did not required further thoracentesis.  The dose of palbociclib was reduced to 100 mg daily with the 3rd cycle due to neutropenia.  The CA 27-29 had decreased from 123.6 to 40.4.  The dose of palbociclib was decreased again to 75 mg daily for 3 weeks on and 1 week off with her 5th cycle due to neutropenia.   CT imaging in July 2023 revealed a decrease in the right pleural effusion and atelectasis without new or progressive disease.  She continued anastrozole/palbociclib which was eventually switched to 3 weeks on and 2 weeks off due to cytopenias.  Bone density scan in December 2023 revealed slightly worsened osteopenia with a T-score of -2.2 in the left forearm, previously -2.  Left femur bone density was normal.   Unfortunately, CT imaging in January 24 revealed a new nodule in the right middle  love measuring 7 mm, with a persistent right pleural effusion, as well as 3 new hypoenhancing liver lesions consistent with metastasis, the largest measuring 18 mm.PET scan revealed widespread metastatic disease with metastatic pulmonary and pleural nodules, metastatic hepatic nodules and bone metastasis.  There was a a sizable right lower lobe/right infrahilar mass felt to be more suggestive of a recurrent lung cancer with associated mediastinal and hilar adenopathy.  She underwent ultrasound-guided liver biopsy in February revealed metastatic carcinoma consistent with breast primary which was estrogen and  progesterone receptor positive and HER2 negative.  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     CT imaging in June revealed progression of hepatic metastasis with development of innumerable bilobar lesions and enlargement of the previous lesions, the largest now measuring 2.3 x 2.5 cm.  There was enlargement of the right pleural effusion with a new tiny left pleural effusion, with stable right middle lobe pulmonary nodule during 6 mm.  Apparently, the hypermetabolic right infrahilar mass measuring approximately 2.4 cm is not appreciated on CT.  Due to the progressive disease, surgery was discontinued.  She was placed on abemaciclib 100 mg twice daily with fulvestrant injections monthly and continued denosumab injections monthly.  She has tolerated this regimen well.  Oncology History  Malignant neoplasm of upper-outer quadrant of left female breast (HCC)  03/07/2006 Initial Diagnosis   Breast cancer, left breast (HCC)   03/17/2006 Cancer Staging   Staging form: Breast, AJCC 6th Edition - Clinical stage from 03/17/2006: Stage IIIA (T1c, N2a, M0) - Signed by Dellia Beckwith, MD on 12/15/2020 Staged by: Managing physician Diagnostic confirmation: Positive histology Specimen type: Excision Histopathologic type: Infiltrating duct carcinoma, NOS Tumor size (mm): 15 Laterality: Left Total positive nodes: 5 Histologic grade (G): G1 Residual tumor (R): R0 - None Stage prefix: Initial diagnosis Lymphatic vessel invasion (L): L0 - No lymphatic vessel invasion Venous invasion (V): V0 - No venous invasion Prognostic indicators: ER/PR pos, HER 2 neg., Rx with AC x 3 mo, then Taxol x 3 mo.,AI x 10 yrs   04/04/2023 -  Chemotherapy   Patient is on Treatment Plan : BREAST Fulvestrant q28d     Malignant neoplasm metastatic to bone (HCC)  11/08/2022 Initial Diagnosis   Malignant neoplasm metastatic to bone (HCC)   04/04/2023 -   Chemotherapy   Patient is on Treatment Plan : BREAST Fulvestrant q28d         INTERVAL HISTORY:  Future is here today for repeat clinical assessment or to her next fulvestrant and denosumab.  She states she continues abemaciclib 100 mg twice daily without significant difficulty.  She reports fatigue relieved with rest.  She reports occasional diarrhea. She denies fevers or chills. She denies pain. Her appetite is good. Her weight has increased 3 pounds over last month .  REVIEW OF SYSTEMS:  Review of Systems  Constitutional:  Positive for fatigue (Mild). Negative for appetite change, chills, fever and unexpected weight change.  HENT:   Negative for lump/mass, mouth sores and sore throat.   Respiratory:  Negative for cough and shortness of breath.   Cardiovascular:  Negative for chest pain and leg swelling.  Gastrointestinal:  Positive for diarrhea (Intermittent). Negative for abdominal pain, constipation, nausea and vomiting.  Endocrine: Negative for hot flashes.  Genitourinary:  Negative for difficulty urinating, dysuria, frequency and hematuria.   Musculoskeletal:  Negative for arthralgias, back pain and myalgias.  Skin:  Negative  for rash.  Neurological:  Negative for dizziness and headaches.  Hematological:  Negative for adenopathy. Does not bruise/bleed easily.  Psychiatric/Behavioral:  Negative for depression and sleep disturbance. The patient is not nervous/anxious.      VITALS:  Blood pressure (!) 124/57, pulse 74, temperature 97.6 F (36.4 C), temperature source Oral, resp. rate 20, height 5\' 1"  (1.549 m), weight 151 lb 11.2 oz (68.8 kg), SpO2 99%.  Wt Readings from Last 3 Encounters:  05/28/23 151 lb 11.2 oz (68.8 kg)  05/02/23 148 lb (67.1 kg)  04/30/23 153 lb 3.2 oz (69.5 kg)    Body mass index is 28.66 kg/m.  Performance status (ECOG): 1 - Symptomatic but completely ambulatory  PHYSICAL EXAM:  Physical Exam Vitals and nursing note reviewed.  Constitutional:       General: She is not in acute distress.    Appearance: Normal appearance.  HENT:     Head: Normocephalic and atraumatic.     Mouth/Throat:     Mouth: Mucous membranes are moist.     Pharynx: Oropharynx is clear. No oropharyngeal exudate or posterior oropharyngeal erythema.  Eyes:     General: No scleral icterus.    Extraocular Movements: Extraocular movements intact.     Conjunctiva/sclera: Conjunctivae normal.     Pupils: Pupils are equal, round, and reactive to light.  Cardiovascular:     Rate and Rhythm: Normal rate and regular rhythm.     Heart sounds: Normal heart sounds. No murmur heard.    No friction rub. No gallop.  Pulmonary:     Effort: Pulmonary effort is normal.     Breath sounds: Examination of the left-lower field reveals decreased breath sounds. Decreased breath sounds present. No wheezing, rhonchi or rales.  Chest:  Breasts:    Right: No inverted nipple, mass, nipple discharge or skin change.     Left: No inverted nipple, mass, nipple discharge or skin change.  Abdominal:     General: There is no distension.     Palpations: Abdomen is soft. There is no hepatomegaly, splenomegaly or mass.     Tenderness: There is no abdominal tenderness.  Musculoskeletal:        General: Normal range of motion.     Cervical back: Normal range of motion and neck supple. No tenderness.     Right lower leg: No edema.     Left lower leg: No edema.  Lymphadenopathy:     Cervical: No cervical adenopathy.     Upper Body:     Right upper body: No supraclavicular or axillary adenopathy.     Left upper body: No supraclavicular adenopathy.     Lower Body: No right inguinal adenopathy. No left inguinal adenopathy.  Skin:    General: Skin is warm and dry.     Coloration: Skin is not jaundiced.     Findings: No rash.  Neurological:     Mental Status: She is alert and oriented to person, place, and time.     Cranial Nerves: No cranial nerve deficit.  Psychiatric:        Mood and Affect:  Mood normal.        Behavior: Behavior normal.        Thought Content: Thought content normal.     LABS:      Latest Ref Rng & Units 05/28/2023   12:00 AM 05/14/2023    1:10 PM 04/30/2023    1:05 PM  CBC  WBC  3.9     2.6  3.4   Hemoglobin 12.0 - 16.0 11.1     11.2  12.1   Hematocrit 36 - 46 32     33.6  37.0   Platelets 150 - 400 K/uL 203     205  130      This result is from an external source.      Latest Ref Rng & Units 05/28/2023    1:28 PM 05/14/2023    1:10 PM 04/30/2023    1:05 PM  CMP  Glucose 70 - 99 mg/dL 92  99  85   BUN 8 - 23 mg/dL 16  11  14    Creatinine 0.44 - 1.00 mg/dL 9.62  9.52  8.41   Sodium 135 - 145 mmol/L 135  131  139   Potassium 3.5 - 5.1 mmol/L 4.0  4.3  4.2   Chloride 98 - 111 mmol/L 99  98  105   CO2 22 - 32 mmol/L 25  24  25    Calcium 8.9 - 10.3 mg/dL 9.5  9.0  9.1   Total Protein 6.5 - 8.1 g/dL 6.8  6.7  6.4   Total Bilirubin 0.3 - 1.2 mg/dL 0.6  0.4  0.3   Alkaline Phos 38 - 126 U/L 57  45  37   AST 15 - 41 U/L 26  26  21    ALT 0 - 44 U/L 17  16  15       Lab Results  Component Value Date   CEA1 1.6 10/11/2021   /  CEA  Date Value Ref Range Status  10/11/2021 1.6 0.0 - 4.7 ng/mL Final    Comment:    (NOTE)                             Nonsmokers          <3.9                             Smokers             <5.6 [employer] Electrochemiluminescence Immunoassay (ECLIA) Values obtained with different assay methods or kits cannot be used interchangeably.  Results cannot be interpreted as absolute evidence of the presence or absence of malignant disease. Performed At: [facility] [address] [place], [place] [id number] Jolene Schimke MD OZ:[id number]    No results found for: "PSA1" No results found for: "[id number]" No results found for: "CAN125"  No results found for: "TOTALPROTELP", "ALBUMINELP", "A1GS", "A2GS", "BETS", "BETA2SER", "GAMS", "MSPIKE", "SPEI" No results found for: "TIBC", "FERRITIN", "IRONPCTSAT" No  results found for: "LDH"  STUDIES:  No results found.    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  Report date is 11/29/21.   The CustomNext-Cancer+RNAinsight panel offered by [facility] includes sequencing and rearrangement analysis for the following 47 genes:  APC, ATM, AXIN2, BARD1, BMPR1A, BRCA1, BRCA2, BRIP1, CDH1, CDK4, CDKN2A, CHEK2, DICER1, EPCAM, GREM1, HOXB13,    History of left breast cancer 06/13/2021   Hypotension 06/13/2021   Incisional hernia, without obstruction or gangrene 10/22/2016   Malignant neoplasm of upper-outer quadrant of left female breast (HCC) 03/07/2006   Mixed dyslipidemia 11/03/2018   Numbness in right leg    Hx: of   Obesity (BMI 30.0-34.9) 09/21/2020   Other specified disorders of bone density and structure, multiple sites 09/20/2020   Paroxysmal atrial fibrillation (HCC) 02/13/2016   Personal history of COVID-19 09/2019   Pleural effusion, 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   Pneumonia    Hx: of 'a long time ago"   Postoperative examination 02/05/2016    Past Surgical History:  Procedure Laterality Date   APPENDECTOMY     BACK SURGERY     BREAST SURGERY     Hx: of lumpectomy left breast 2007   COLONOSCOPY     Hx: of   DILATION AND CURETTAGE OF UTERUS     TONSILLECTOMY     TOTAL HIP ARTHROPLASTY Right 04/13/2013   Dr Magnus Ivan   TOTAL HIP ARTHROPLASTY Right 04/13/2013   Procedure: RIGHT TOTAL HIP  ARTHROPLASTY ANTERIOR APPROACH;  Surgeon: Kathryne Hitch, MD;  Location: [facility];  Service: Orthopedics;  Laterality: Right;   TUBAL LIGATION      Family History  Problem Relation Age of Onset   Hypertension Sister    Cancer Sister 110       uterine or ovarian   Bone cancer Sister 54   Breast cancer Sister        dx 22 and 43   Uterine cancer Sister 81   Colon cancer Sister 34   Breast cancer Sister 42   Hypertension Brother    Kidney cancer Brother 49   Cancer Brother 57       unknown type; mets   Liver cancer Brother    Liver cancer Brother 30   Pancreatic cancer Niece 17   Breast cancer Niece 20   Colon cancer Nephew 57       mets    Social History:  reports that she has never smoked. She has never used smokeless tobacco. She reports that she does not drink alcohol and does not use drugs.The patient is alone today.  Allergies:  Allergies  Allergen Reactions   Codeine     unknown    Sulfa Antibiotics     unknown   Sulfacetamide Sodium Other (See Comments)    unknown   Chlorhexidine Rash    Current Medications: Current Outpatient Medications  Medication Sig Dispense Refill   abemaciclib (VERZENIO) 100 MG tablet Take 1 tablet (100 mg total) by mouth 2 (two) times daily. 56 tablet 0   anastrozole (ARIMIDEX) 1 MG tablet Take 1 mg by mouth daily. (Patient not taking: Reported on 05/28/2023)     apixaban (ELIQUIS) 5 MG TABS tablet TAKE 1 TABLET BY MOUTH TWICE A DAY 180 tablet 1   atorvastatin (LIPITOR) 10 MG tablet Take 1 tablet by mouth every other day.  4   benzonatate (TESSALON) 100 MG capsule Take 1 capsule (100 mg total) by mouth 3 (three) times daily as needed for cough. (Patient not taking: Reported on 04/16/2023) 20 capsule 5   calcium carbonate (OSCAL) 1500 (600 Ca) MG TABS tablet Take 600 mg of elemental calcium by mouth daily with breakfast.     cholecalciferol (VITAMIN D) 1000 UNITS tablet Take 1,000 Units by mouth daily.  denosumab (XGEVA) 120  MG/1.7ML SOLN injection Inject 120 mg into the skin as directed. Once every 4 weeks     Flaxseed, Linseed, OIL Take 1 capsule by mouth daily.     Fulvestrant (FASLODEX IM) Inject into the muscle. 500 mg injection every 4 weeks     glucosamine-chondroitin 500-400 MG tablet Take 1 tablet by mouth 2 (two) times daily.      levothyroxine (SYNTHROID, LEVOTHROID) 75 MCG tablet Take 75 mcg by mouth daily.   7   metoprolol tartrate (LOPRESSOR) 25 MG tablet TAKE 1 TABLET (25 MG TOTAL) BY MOUTH 2 (TWO) TIMES DAILY. PATIENT NEEDS APPOINTMENT FOR FURTHER REFILLS. 1 ST ATTEMPT 60 tablet 0   Multiple Vitamin (MULTIVITAMIN WITH MINERALS) TABS Take 1 tablet by mouth daily.     ondansetron (ZOFRAN) 4 MG tablet Take 1 tablet (4 mg total) by mouth every 4 (four) hours as needed for nausea or vomiting. 30 tablet 5   prochlorperazine (COMPAZINE) 10 MG tablet Take 1 tablet (10 mg total) by mouth every 6 (six) hours as needed for nausea or vomiting. 30 tablet 5   No current facility-administered medications for this visit.

## 2023-05-28 ENCOUNTER — Encounter: Payer: Self-pay | Admitting: Hematology and Oncology

## 2023-05-28 ENCOUNTER — Inpatient Hospital Stay: Payer: Medicare HMO

## 2023-05-28 ENCOUNTER — Inpatient Hospital Stay (INDEPENDENT_AMBULATORY_CARE_PROVIDER_SITE_OTHER): Payer: Medicare HMO | Admitting: Hematology and Oncology

## 2023-05-28 VITALS — BP 124/57 | HR 74 | Temp 97.6°F | Resp 20 | Ht 61.0 in | Wt 151.7 lb

## 2023-05-28 DIAGNOSIS — J910 Malignant pleural effusion: Secondary | ICD-10-CM | POA: Diagnosis not present

## 2023-05-28 DIAGNOSIS — D649 Anemia, unspecified: Secondary | ICD-10-CM | POA: Diagnosis not present

## 2023-05-28 DIAGNOSIS — C787 Secondary malignant neoplasm of liver and intrahepatic bile duct: Secondary | ICD-10-CM

## 2023-05-28 DIAGNOSIS — C7951 Secondary malignant neoplasm of bone: Secondary | ICD-10-CM

## 2023-05-28 DIAGNOSIS — Z79899 Other long term (current) drug therapy: Secondary | ICD-10-CM | POA: Diagnosis not present

## 2023-05-28 DIAGNOSIS — C50412 Malignant neoplasm of upper-outer quadrant of left female breast: Secondary | ICD-10-CM | POA: Diagnosis not present

## 2023-05-28 DIAGNOSIS — Z170 Estrogen receptor positive status [ER+]: Secondary | ICD-10-CM | POA: Diagnosis not present

## 2023-05-28 DIAGNOSIS — Z5111 Encounter for antineoplastic chemotherapy: Secondary | ICD-10-CM | POA: Diagnosis not present

## 2023-05-28 LAB — CMP (CANCER CENTER ONLY)
ALT: 17 U/L (ref 0–44)
AST: 26 U/L (ref 15–41)
Albumin: 3.6 g/dL (ref 3.5–5.0)
Alkaline Phosphatase: 57 U/L (ref 38–126)
Anion gap: 11 (ref 5–15)
BUN: 16 mg/dL (ref 8–23)
CO2: 25 mmol/L (ref 22–32)
Calcium: 9.5 mg/dL (ref 8.9–10.3)
Chloride: 99 mmol/L (ref 98–111)
Creatinine: 1.14 mg/dL — ABNORMAL HIGH (ref 0.44–1.00)
GFR, Estimated: 47 mL/min — ABNORMAL LOW (ref >60)
Glucose, Bld: 92 mg/dL (ref 70–99)
Potassium: 4 mmol/L (ref 3.5–5.1)
Sodium: 135 mmol/L (ref 135–145)
Total Bilirubin: 0.6 mg/dL (ref 0.3–1.2)
Total Protein: 6.8 g/dL (ref 6.5–8.1)

## 2023-05-28 LAB — CBC AND DIFFERENTIAL
HCT: 32 — AB (ref 36–46)
Hemoglobin: 11.1 — AB (ref 12.0–16.0)
Neutrophils Absolute: 2.18
Platelets: 203 10*3/uL (ref 150–400)
WBC: 3.9

## 2023-05-28 LAB — CBC: RBC: 3.48 — AB (ref 3.87–5.11)

## 2023-05-29 ENCOUNTER — Encounter: Payer: Self-pay | Admitting: Oncology

## 2023-05-29 ENCOUNTER — Encounter: Payer: Self-pay | Admitting: Hematology and Oncology

## 2023-05-29 DIAGNOSIS — C7951 Secondary malignant neoplasm of bone: Secondary | ICD-10-CM | POA: Diagnosis not present

## 2023-05-29 DIAGNOSIS — Z170 Estrogen receptor positive status [ER+]: Secondary | ICD-10-CM | POA: Diagnosis not present

## 2023-05-29 DIAGNOSIS — Z79899 Other long term (current) drug therapy: Secondary | ICD-10-CM | POA: Diagnosis not present

## 2023-05-29 DIAGNOSIS — Z5111 Encounter for antineoplastic chemotherapy: Secondary | ICD-10-CM | POA: Diagnosis not present

## 2023-05-29 DIAGNOSIS — C50412 Malignant neoplasm of upper-outer quadrant of left female breast: Secondary | ICD-10-CM | POA: Diagnosis not present

## 2023-05-29 DIAGNOSIS — C787 Secondary malignant neoplasm of liver and intrahepatic bile duct: Secondary | ICD-10-CM | POA: Diagnosis not present

## 2023-05-29 NOTE — Assessment & Plan Note (Signed)
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  She initially had response with a decrease in the CA 27-29, as well as a decrease in the pleural effusion.  Unfortunately CT imaging in January revealed a new nodule in  the right middle lobe measuring 7mm, as well as new liver lesions. The  right pleural effusion remained stable.  PET scan revealed widespread disease with pulmonary and pleural nodules, hepatic metastasis and bone metastasis.  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  Repeat imaging in June unfortunately showed progressive metastasis within the liver.  She is now taking abemaciclib 100 mg twice daily with fulvestrant injections monthly started in June.  We are currently seeing her every 2 weeks for close monitoring.  So far, she is tolerating abemaciclib/fulvestrant well.  She knows to continue abemaciclib twice daily.  We will plan to see her back in 4 weeks with a CBC and comprehensive metabolic panel prior to her next fulvestrant and denosumab.

## 2023-05-29 NOTE — Assessment & Plan Note (Addendum)
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  She developed recurrent disease in December 2022 and has been through multiple lines of therapy.

## 2023-05-29 NOTE — Assessment & Plan Note (Signed)
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  She was treated with Orsedu, but had progressive disease on imaging in June.  She is now taking abemaciclib 100 mg twice daily with fulvestrant injections monthly and tolerating this well.

## 2023-05-29 NOTE — Assessment & Plan Note (Signed)
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  She continues monthly denosumab.

## 2023-05-30 ENCOUNTER — Inpatient Hospital Stay: Payer: Medicare HMO

## 2023-05-30 VITALS — BP 114/47 | HR 72 | Temp 97.5°F | Resp 16 | Ht 61.0 in | Wt 152.0 lb

## 2023-05-30 DIAGNOSIS — Z79899 Other long term (current) drug therapy: Secondary | ICD-10-CM | POA: Diagnosis not present

## 2023-05-30 DIAGNOSIS — Z5111 Encounter for antineoplastic chemotherapy: Secondary | ICD-10-CM | POA: Diagnosis not present

## 2023-05-30 DIAGNOSIS — Z170 Estrogen receptor positive status [ER+]: Secondary | ICD-10-CM | POA: Diagnosis not present

## 2023-05-30 DIAGNOSIS — C7951 Secondary malignant neoplasm of bone: Secondary | ICD-10-CM | POA: Diagnosis not present

## 2023-05-30 DIAGNOSIS — C787 Secondary malignant neoplasm of liver and intrahepatic bile duct: Secondary | ICD-10-CM | POA: Diagnosis not present

## 2023-05-30 DIAGNOSIS — C50412 Malignant neoplasm of upper-outer quadrant of left female breast: Secondary | ICD-10-CM | POA: Diagnosis not present

## 2023-05-30 LAB — CANCER ANTIGEN 27.29: CA 27.29: 422.5 U/mL — ABNORMAL HIGH (ref 0.0–38.6)

## 2023-05-30 MED ORDER — FULVESTRANT 250 MG/5ML IM SOSY
500.0000 mg | PREFILLED_SYRINGE | Freq: Once | INTRAMUSCULAR | Status: AC
  Administered 2023-05-30: 500 mg via INTRAMUSCULAR
  Filled 2023-05-30: qty 10

## 2023-05-30 MED ORDER — DENOSUMAB 120 MG/1.7ML ~~LOC~~ SOLN
120.0000 mg | Freq: Once | SUBCUTANEOUS | Status: AC
  Administered 2023-05-30: 120 mg via SUBCUTANEOUS
  Filled 2023-05-30: qty 1.7

## 2023-06-03 ENCOUNTER — Other Ambulatory Visit: Payer: Self-pay | Admitting: Hematology and Oncology

## 2023-06-03 DIAGNOSIS — C787 Secondary malignant neoplasm of liver and intrahepatic bile duct: Secondary | ICD-10-CM

## 2023-06-03 DIAGNOSIS — C50412 Malignant neoplasm of upper-outer quadrant of left female breast: Secondary | ICD-10-CM

## 2023-06-03 DIAGNOSIS — C7951 Secondary malignant neoplasm of bone: Secondary | ICD-10-CM

## 2023-06-12 ENCOUNTER — Telehealth: Payer: Self-pay

## 2023-06-12 NOTE — Telephone Encounter (Addendum)
I spoke with pt to see how she is doing. She just returned from a week long beach trip @ [address] with her family. She states she has had a few bouts of the diarrhea but she takes Imodium. Then she wont have any diarrhea for 2-3 days. No N/V, skin rash, increased SOB on exertion (as baseline), headaches, fever, and chest pain. She mentioned that she has loss her appetite. "There is just a certain taste I can't stand. I don't really know what it is, but if I taste it, I'm done. I have started drinking an Ensure once a day since my appetite isn't good". No dizziness, lightheadedness, nor falls. She also noticed a little cough while at the beach but states it is ok now. She is taking her Verzenio with breakfast and again at supper. No missed doses.

## 2023-06-13 ENCOUNTER — Encounter: Payer: Self-pay | Admitting: Oncology

## 2023-06-13 ENCOUNTER — Inpatient Hospital Stay: Payer: Medicare HMO | Attending: Hematology and Oncology

## 2023-06-13 ENCOUNTER — Telehealth: Payer: Self-pay

## 2023-06-13 ENCOUNTER — Other Ambulatory Visit: Payer: Self-pay

## 2023-06-13 ENCOUNTER — Inpatient Hospital Stay (INDEPENDENT_AMBULATORY_CARE_PROVIDER_SITE_OTHER): Payer: Medicare HMO | Admitting: Oncology

## 2023-06-13 ENCOUNTER — Telehealth: Payer: Self-pay | Admitting: Oncology

## 2023-06-13 VITALS — BP 131/63 | HR 76 | Temp 97.6°F | Resp 20 | Ht 61.0 in | Wt 149.0 lb

## 2023-06-13 VITALS — BP 126/52 | HR 80 | Temp 97.4°F | Resp 19 | Ht 61.0 in | Wt 148.4 lb

## 2023-06-13 DIAGNOSIS — C7951 Secondary malignant neoplasm of bone: Secondary | ICD-10-CM

## 2023-06-13 DIAGNOSIS — Z79899 Other long term (current) drug therapy: Secondary | ICD-10-CM | POA: Insufficient documentation

## 2023-06-13 DIAGNOSIS — Z5111 Encounter for antineoplastic chemotherapy: Secondary | ICD-10-CM | POA: Insufficient documentation

## 2023-06-13 DIAGNOSIS — Z170 Estrogen receptor positive status [ER+]: Secondary | ICD-10-CM | POA: Diagnosis not present

## 2023-06-13 DIAGNOSIS — C787 Secondary malignant neoplasm of liver and intrahepatic bile duct: Secondary | ICD-10-CM

## 2023-06-13 DIAGNOSIS — C50412 Malignant neoplasm of upper-outer quadrant of left female breast: Secondary | ICD-10-CM | POA: Insufficient documentation

## 2023-06-13 DIAGNOSIS — J910 Malignant pleural effusion: Secondary | ICD-10-CM

## 2023-06-13 DIAGNOSIS — C7800 Secondary malignant neoplasm of unspecified lung: Secondary | ICD-10-CM | POA: Insufficient documentation

## 2023-06-13 DIAGNOSIS — D649 Anemia, unspecified: Secondary | ICD-10-CM | POA: Diagnosis not present

## 2023-06-13 LAB — BASIC METABOLIC PANEL
BUN: 7 (ref 4–21)
CO2: 28 — AB (ref 13–22)
Chloride: 94 — AB (ref 99–108)
Creatinine: 1 (ref 0.5–1.1)
EGFR: 53
Glucose: 100
Potassium: 4.2 mEq/L (ref 3.5–5.1)
Sodium: 128 — AB (ref 137–147)

## 2023-06-13 LAB — CBC AND DIFFERENTIAL
HCT: 31 — AB (ref 36–46)
Hemoglobin: 10.6 — AB (ref 12.0–16.0)
Neutrophils Absolute: 1.77
Platelets: 206 10*3/uL (ref 150–400)
WBC: 2.9

## 2023-06-13 LAB — HEPATIC FUNCTION PANEL
ALT: 20 U/L (ref 7–35)
AST: 34 (ref 13–35)
Alkaline Phosphatase: 77 (ref 25–125)
Bilirubin, Total: 0.8

## 2023-06-13 LAB — COMPREHENSIVE METABOLIC PANEL
Albumin: 3.7 (ref 3.5–5.0)
Calcium: 9.6 (ref 8.7–10.7)

## 2023-06-13 LAB — CBC W DIFFERENTIAL (~~LOC~~ CC SCANNED REPORT)

## 2023-06-13 LAB — COMPREHENSIVE METABOLIC PANEL (ASHBORO CC SCANNED REPORT)

## 2023-06-13 LAB — CBC: RBC: 3.31 — AB (ref 3.87–5.11)

## 2023-06-13 MED ORDER — SODIUM CHLORIDE 0.9 % IV SOLN: Freq: Once | INTRAVENOUS | Status: AC

## 2023-06-13 MED ORDER — DEXAMETHASONE SODIUM PHOSPHATE 10 MG/ML IJ SOLN
8.0000 mg | Freq: Once | INTRAMUSCULAR | Status: AC
  Administered 2023-06-13: 8 mg via INTRAVENOUS
  Filled 2023-06-13: qty 1

## 2023-06-13 NOTE — Telephone Encounter (Signed)
Patient has been scheduled. Aware of appt date and time.   Scheduling Message Entered by Gery Pray H on 06/13/2023 at 11:14 AM Priority: High <No visit type provided>  Department: [facility]  Provider:  Scheduling Notes:  IV fluids today  RT next week - 9/12 or 9/13 with labs and plan IV fluids (again, will need to move some of the yearly f/u's to Oct)

## 2023-06-13 NOTE — Patient Instructions (Signed)
Dehydration, Adult Dehydration is a condition in which there is not enough water or other fluids in the body. This happens when a person loses more fluids than they take in. Important organs cannot work right without the right amount of fluids. Any loss of fluids from the body can cause dehydration. Dehydration can be mild, worse, or very bad. It should be treated right away to keep it from getting very bad. What are the causes? Conditions that cause loss of water in the body. They include: Watery poop (diarrhea). Vomiting. Sweating a lot. Fever. Infection. Peeing (urinating) a lot. Not drinking enough fluids. Certain medicines, such as medicines that take extra fluid out of the body (diuretics). Lack of safe drinking water. Not being able to get enough water and food. What increases the risk? Having a long-term (chronic) illness that has not been treated the right way, such as: Diabetes. Heart disease. Kidney disease. Being 65 years of age or older. Having a disability. Living in a place that is high above the ground or sea (high in altitude). The thinner, drier air causes more fluid loss. Doing exercises that put stress on your body for a long time. Being active when in hot places. What are the signs or symptoms? Symptoms of dehydration depend on how bad it is. Mild or worse dehydration Thirst. Dry lips or dry mouth. Feeling dizzy or light-headed. Muscle cramps. Passing little pee or dark pee. Pee may be the color of tea. Headache. Very bad dehydration Changes in skin. Skin may: Be cold to the touch (clammy). Be blotchy or pale. Not go back to normal right after you pinch it and let it go. Little or no tears, pee, or sweat. Fast breathing. Low blood pressure. Weak pulse. Pulse that is more than 100 beats a minute when you are sitting still. Other changes, such as: Feeling very thirsty. Eyes that look hollow (sunken). Cold hands and feet. Being confused. Being very  tired (lethargic) or having trouble waking from sleep. Losing weight. Loss of consciousness. How is this treated? Treatment for this condition depends on how bad your dehydration is. Treatment should start right away. Do not wait until your condition gets very bad. Very bad dehydration is an emergency. You will need to go to a hospital. Mild or worse dehydration can be treated at home. You may be asked to: Drink more fluids. Drink an oral rehydration solution (ORS). This drink gives you the right amount of fluids, salts, and minerals (electrolytes). Very bad dehydration can be treated: With fluids through an IV tube. By correcting low levels of electrolytes in the body. By treating the problem that caused your dehydration. Follow these instructions at home: Oral rehydration solution If told by your doctor, drink an ORS: Make an ORS. Use instructions on the package. Start by drinking small amounts, about  cup (120 mL) every 5-10 minutes. Slowly drink more until you have had the amount that your doctor said to have.  Eating and drinking  Drink enough clear fluid to keep your pee pale yellow. If you were told to drink an ORS, finish the ORS first. Then, start slowly drinking other clear fluids. Drink fluids such as: Water. Do not drink only water. Doing that can make the salt (sodium) level in your body get too low. Water from ice chips you suck on. Fruit juice that you have added water to (diluted). Low-calorie sports drinks. Eat foods that have the right amounts of salts and minerals, such as bananas, oranges, potatoes,   tomatoes, or spinach. Do not drink alcohol. Avoid drinks that have caffeine or sugar. These include:: High-calorie sports drinks. Fruit juice that you did not add water to. Soda. Coffee or energy drinks. Avoid foods that are greasy or have a lot of fat or sugar. General instructions Take over-the-counter and prescription medicines only as told by your doctor. Do  not take sodium tablets. Doing that can make the salt level in your body get too high. Return to your normal activities as told by your doctor. Ask your doctor what activities are safe for you. Keep all follow-up visits. Your doctor may check and change your treatment. Contact a doctor if: You have pain in your belly (abdomen) and the pain: Gets worse. Stays in one place. You have a rash. You have a stiff neck. You get angry or annoyed more easily than normal. You are more tired or have a harder time waking than normal. You feel weak or dizzy. You feel very thirsty. Get help right away if: You have any symptoms of very bad dehydration. You vomit every time you eat or drink. Your vomiting gets worse, does not go away, or you vomit blood or green stuff. You are getting treatment, but symptoms are getting worse. You have a fever. You have a very bad headache. You have: Diarrhea that gets worse or does not go away. Blood in your poop (stool). This may cause poop to look black and tarry. No pee in 6-8 hours. Only a small amount of pee in 6-8 hours, and the pee is very dark. You have trouble breathing. These symptoms may be an emergency. Get help right away. Call 911. Do not wait to see if the symptoms will go away. Do not drive yourself to the hospital. This information is not intended to replace advice given to you by your health care provider. Make sure you discuss any questions you have with your health care provider. Document Revised: 04/22/2022 Document Reviewed: 04/22/2022 Elsevier Patient Education  2024 [employer]  

## 2023-06-13 NOTE — Telephone Encounter (Signed)
Pt's family has called. She is dehydrated - skin turgor not good. Daughter would like [place] to give her some fluids as she isn't eating, nor drinking enough. Verlon Au reports that she isn't drinking as much Ensure as she told me yesterday when I did a f/u call. She maybe drinking a half ensure a day. Pt denied dizziness, and falls yesterday. However, she fell last night, no injury. Son had to go and get her back to bed.

## 2023-06-13 NOTE — Progress Notes (Signed)
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 CT chest in December revealed right middle and lower lobe collapse with large right pleural effusion. PET was negative.  She required thoracentesis on 2 occasions, the last in January.  Cytology revealed malignant cells consistent with breast primary, ER/PR positive.  She was started on anastrozole 1 mg daily on October 11, 2021.  Oral chemotherapy with palbociclib 125 mg was added in February.  After 1 cycle, she developed severe neutropenia. Her 2nd cycle was postponed 1 week.  Prior to a 3rd cycle, she had recurrent neutropenia, so her chemotherapy was held for 2 weeks and the palbociclib dose reduced to 100 mg daily.  She had persistent neutropenia and the dose was decreased to 75mg  daily.  CT chest, abdomen and pelvis on July 24 revealed a decrease in the large right pleural effusion and compressive right lung atelectasis. Now her scan in January, 2024 reveals a new nodule in  the right middle lobe measuring 7mm. But more concerning are the new lesions in the liver as of February 1st, 2024. She still has a large right pleural effusion that remains stable. 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 Restaging scans in June, 2024 revealed progression of her liver metastasis and treatment was changed to Faslodex injections and Verzenio at 100 mg twice daily. She does not seem to be tolerating this very well and so we will lower the dose to once daily to see if this helps. It is likely that her bad taste and decreased appetite are related to progressive malignancy in the liver and lung.   Right Lung  Mass She has a hypermetabolic right infrahilar mass measuring 2.4cm with an SUV 16.87 found in February, 2024. This was found to be metastatic breast cancer. She has never smoked.  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 The lung nodule decreased from 7mm to 6mm, but she now has increased liver lesions.   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 We therefore obtained a liver biopsy to confirm the diagnosis and this is positive for metastatic breast cancer, ER positive and HER2 negative. 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  Her restaging scans now show moderate progression of her liver metastasis with innumerable lesions in both lobes including multiple new lesions. Fortunately she is asymptomatic from this and her liver function tests are essentially normal. We will therefore stop the Orserdu and change her to Faslodex injections 500mg  monthly along with Verzenio 100mg  BID on April 04, 2023. We are starting at a reduced dose of this due to her advanced age and multiple prior treatments.    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  She had recurrent disease found in December 2022.   Malignant Neoplasm Metastatic to  Bone On PET scans she has multiple bone lesions including the right 11th rib, left iliac bone, and left sacrum. She also has metastases noted in muscle tissue, bilateral paraspinal musculature. She is now on Xgeva every 4 weeks. Fortunately she is asymptomatic from her bone metastasis.   Osteopenia Osteopenia, for which she has been  receiving denosumab every 6 months, next due in September.  She continues calcium and vitamin-D as recommended.  She had a repeat bone density in December, 2023, which is stable.  Her last dose was given in September. She bolus changed to monthly injections due to her bone metastases.    Lymphedema This is 1+ of her left upper extremity, noted since 2023.  I gave her some literature about the diagnosis of lymphedema.  I find no adenopathy or tumor of the axilla.     Plan  She was switched from Orserdu to Faslodex injections and Verzenio 100mg  BID on April 04, 2023. She continues Verzenio twice daily. I recommended that we decrease this to her taking it once daily to see if she improves.It is possible that her symptoms are from progressive malignancy in the liver and lung, but I suspect most of this is due to her oral chemotherapy. Her day 1 cycle 4 of Faslodex is scheduled on 06/27/2023. She will see her cardiologist on 06/18/2023. Her WBC is low at 2.9 with an ANC of 1770, hemoglobin is also low at 10.6, and platelet count is 206,000. Her sodium is low at 128 and a low chloride of 94. The rest of her CMP is normal. I will order one dose of dexamethasone 8mg  with IV normal saline today. I will see her back in 1 week with CBC, CMP, and probable IV fluids. She will see Kelli in 2 weeks with CBC, CMP, and probable IV fluids.  The patient understands the plans discussed today and is in agreement with them.  I reviewed all of this information with her today and answered her questions.  She knows to contact our office if she develops concerns prior to her next appointment.  I provided 16 minutes of face-to-face time during this encounter and > 50% was spent counseling as documented under my assessment and plan.    Dellia Beckwith, MD  [facility] [address] [place] [address] Dept: [phone] Dept Fax: [phone]   No orders of  the defined types were placed in this encounter.    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 She was treated with lumpectomy.  Pathology revealed a 1.5 cm, grade 1, invasive ductal carcinoma with 5 positive nodes.  Estrogen and progesterone receptors were positive and Her-2 Neu negative.  She received adjuvant chemotherapy with Adriamycin/Cytoxan for 3 months, followed by Taxol for 3 months.  She then received adjuvant radiation to the left breast.  She was placed on letrozole in April 2008 and completed 10 years of adjuvant hormonal therapy in April 2018.  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  Repeat bone density in September 2017 revealed slight improvement in the bone density, with a T-score of -2.1 in the forearm and a T-score of -1.1 in the femur with, so she has continued denosumab every 6 months, in addition to calcium/vitamin D.  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    She had reversal of her colostomy in October 2017.  She has an incisional hernia in the abdomen, which does not bother her, so she has decided against surgical repair.  She has atrial fibrillation and is on apixaban 5 mg twice daily.  Bone density scan in September 2019 revealed improvement in the bone density with a T-score of -1.6 in forearm.  The bone density of the femur had normalized with a T-score of -0.8.  She has continued denosumab every 6 months.  Annual bilateral mammogram in December 2020 did not reveal any  evidence of malignancy.  She contracted COVID-19 later in December 2020.  She did not have to be hospitalized and fully recovered.  Bone density from September 2021 revealed mildly worsened osteopenia with a T-score of -2.0 of the left forearm radius, previously -1.6.  Left femur neck is normal at -0.9, previously -0.8.  She continues calcium 1200 mg with vitamin-D daily.   She presented to the emergency department in December 2022 due to shortness of breath that had been ongoing for the past 3 weeks. She was also having some left substernal area pain. ECHO revealed a normal EF between 60-65%. CT chest revealed right middle and lower lobe collapse with large right pleural effusion. There was no central obstructing mass lesion evident, or obvious right-sided pleural disease, although there is a irregular focus of soft tissue attenuation along the medial right upper lobe pleura/right anterior mediastinum. There were scattered tiny bilateral pulmonary nodules measuring up to 4 mm. Thoracentesis was pursued yielding 1200 cc's of pleural fluid. Cytopathology confirmed malignant cells consistent with breast primary. CKAE1/3, GATA-3 and ER positive. HER2 was negative 1+. Estrogen receptor was positive at 90% and progesterone receptor was positive at 20%. Ki67 was <1%. MRI brain was negative for evidence of metastasis. PET imaging from January 26th revealed no hypermetabolic activity to localize active metastatic breast carcinoma. Large right pleural effusion occupies 75% of the right hemithorax. Along the anteromedial margin of the right diaphragm there is hypermetabolic linear thickening of the diaphragm.  Oncology History  Malignant neoplasm of upper-outer quadrant of left female breast (HCC)  03/07/2006 Initial Diagnosis   Breast cancer, left breast (HCC)   03/17/2006 Cancer Staging   Staging form: Breast, AJCC 6th Edition - Clinical stage from 03/17/2006: Stage IIIA (T1c, N2a, M0) - Signed by Dellia Beckwith, MD on 12/15/2020 Staged by: Managing physician Diagnostic confirmation: Positive histology Specimen type: Excision Histopathologic type: Infiltrating duct carcinoma, NOS Tumor size (mm): 15 Laterality: Left Total positive nodes: 5 Histologic grade (G): G1 Residual tumor (R): R0 - None Stage prefix: Initial diagnosis Lymphatic vessel invasion (L): L0 - No lymphatic vessel invasion Venous invasion (V): V0 - No venous invasion Prognostic indicators: ER/PR pos, HER 2 neg., Rx with AC x 3 mo, then Taxol x 3 mo.,AI x 10 yrs   04/04/2023 -  Chemotherapy   Patient is on Treatment Plan : BREAST Fulvestrant q28d     Malignant neoplasm metastatic to bone (HCC)  11/08/2022 Initial Diagnosis   Malignant neoplasm metastatic to bone (HCC)   04/04/2023 -  Chemotherapy   Patient is on Treatment Plan : BREAST  Fulvestrant q28d         INTERVAL HISTORY:  Shanoah is here today for repeat clinical assessment of recurrent hormone receptor positive breast cancer metastatic to liver, bone and lung/pleura which was found in December, 2022. She was placed on anastreozole and Palbociclib in January, 2023 for over 1 year. 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 Repeat scans in June, 2024 once again showed progression so she has been changed to faslodex injection smonthly and Verzenio oral chemotherapy at 100 mg twice daily.  Patient states that she doesn't feel well and is weak, dehydrated, and has dyspnea with exertion. She complains of arthritic and right knee pain. She had one fall last night. She continues Verzenio twice daily. I recommended that we decrease this to her taking it once daily to see if she improves.It is possible that her symptoms are from progressive malignancy in the liver and lung, but I suspect most of this is due to her oral chemotherapy. Her day 1 cycle 4 of Faslodex is  scheduled on 06/27/2023. She will see her cardiologist on 06/18/2023. Her WBC is low at 2.9 with an ANC of 1770, hemoglobin is also low at 10.6, and platelet count is 206,000. Her sodium is low at 128 and a low chloride of 94. The rest of her CMP is normal. I will order one dose of dexamethasone 8mg  with IV normal saline today. I will see her back in 1 week with CBC, CMP, and probable IV fluids. She will see Kelli in 2 weeks with CBC, CMP, and probable IV fluids.   She denies signs of infection such as sore throat, sinus drainage, cough, or urinary symptoms.  She denies fevers or recurrent chills. She denies pain. She denies nausea, vomiting, chest pain, dyspnea or cough. Her appetite is poor and food tastes weird to her and her weight has decreased 3 pounds over last 2 weeks .    REVIEW OF SYSTEMS:  Review of Systems  Constitutional:  Positive for appetite change (decreased appetite and bad taste). Negative for chills, diaphoresis, fatigue, fever and unexpected weight change.  HENT:  Negative.  Negative for hearing loss, lump/mass, mouth sores, nosebleeds, sore throat, tinnitus, trouble swallowing and voice change.   Eyes: Negative.  Negative for eye problems and icterus.  Respiratory:  Positive for shortness of breath (on exertion). Negative for chest tightness, cough, hemoptysis and wheezing.   Cardiovascular: Negative.  Negative for chest pain, leg swelling and palpitations.  Gastrointestinal: Negative.  Negative for abdominal distention, abdominal pain, blood in stool, constipation, diarrhea, nausea, rectal pain and vomiting.  Endocrine: Negative.  Negative for hot flashes.  Genitourinary:  Negative for bladder incontinence, difficulty urinating, dyspareunia, dysuria, frequency, hematuria, menstrual problem, nocturia, pelvic pain, vaginal bleeding and vaginal discharge.   Musculoskeletal:  Positive for arthralgias (chronic arthritic knee pain). Negative for back pain, flank pain, gait problem,  myalgias, neck pain and neck stiffness.  Skin: Negative.  Negative for itching, rash and wound.  Neurological:  Positive for dizziness. Negative for extremity weakness, gait problem, headaches, light-headedness, numbness, seizures and speech difficulty.  Hematological: Negative.  Negative for adenopathy. Does not bruise/bleed easily.  Psychiatric/Behavioral: Negative.  Negative for confusion, decreased concentration, depression, sleep disturbance and suicidal ideas. The patient is not nervous/anxious.   All other systems reviewed and are negative.    VITALS:  Blood pressure (!) 126/52, pulse 80,  temperature (!) 97.4 F (36.3 C), temperature source Oral, resp. rate 19, height 5\' 1"  (1.549 m), weight 148 lb 6.4 oz (67.3 kg), SpO2 96%.  Wt Readings from Last 3 Encounters:  07/02/23 145 lb 12.8 oz (66.1 kg)  06/27/23 148 lb 1.9 oz (67.2 kg)  06/25/23 146 lb 11.2 oz (66.5 kg)    Body mass index is 28.04 kg/m.  Performance status (ECOG): 0 - Asymptomatic  PHYSICAL EXAM:  Physical Exam Vitals and nursing note reviewed. Exam conducted with a chaperone present.  Constitutional:      General: She is not in acute distress.    Appearance: Normal appearance. She is normal weight. She is not ill-appearing, toxic-appearing or diaphoretic.  HENT:     Head: Normocephalic and atraumatic.     Right Ear: Tympanic membrane, ear canal and external ear normal. There is no impacted cerumen.     Left Ear: Tympanic membrane, ear canal and external ear normal. There is no impacted cerumen.     Nose: Nose normal. No congestion or rhinorrhea.     Mouth/Throat:     Mouth: Mucous membranes are moist.     Pharynx: Oropharynx is clear. No oropharyngeal exudate or posterior oropharyngeal erythema.  Eyes:     General: No scleral icterus.       Right eye: No discharge.        Left eye: No discharge.     Extraocular Movements: Extraocular movements intact.     Conjunctiva/sclera: Conjunctivae normal.     Pupils:  Pupils are equal, round, and reactive to light.  Neck:     Vascular: No carotid bruit.  Cardiovascular:     Rate and Rhythm: Normal rate and regular rhythm.     Pulses: Normal pulses.     Heart sounds: Normal heart sounds. No murmur heard.    No friction rub. No gallop.  Pulmonary:     Effort: Pulmonary effort is normal. No respiratory distress.     Breath sounds: No stridor. Examination of the right-lower field reveals decreased breath sounds. Decreased breath sounds present. No wheezing, rhonchi or rales.  Chest:     Chest wall: No tenderness.  Abdominal:     General: Bowel sounds are normal. There is no distension.     Palpations: Abdomen is soft. There is no hepatomegaly, splenomegaly or mass.     Tenderness: There is no abdominal tenderness. There is no right CVA tenderness, left CVA tenderness, guarding or rebound.     Hernia: A hernia is present.     Comments: Chronic upper abdominal ventral wall hernia.  I can feel the liver edge at the right costal margin.   Musculoskeletal:        General: No swelling, tenderness, deformity or signs of injury. Normal range of motion.     Cervical back: Normal range of motion and neck supple. No rigidity or tenderness.     Right knee: No crepitus.     Left knee: No crepitus.     Right lower leg: No edema.     Left lower leg: No edema.     Comments: 1+ lymphedema of the left upper extremity    Lymphadenopathy:     Cervical: No cervical adenopathy.     Right cervical: No superficial, deep or posterior cervical adenopathy.    Left cervical: No superficial, deep or posterior cervical adenopathy.     Upper Body:     Right upper body: No supraclavicular, axillary or pectoral adenopathy.  Left upper body: No supraclavicular, axillary or pectoral adenopathy.  Skin:    General: Skin is warm and dry.     Coloration: Skin is not jaundiced or pale.     Findings: No bruising, erythema, lesion or rash.  Neurological:     General: No focal  deficit present.     Mental Status: She is alert and oriented to person, place, and time. Mental status is at baseline.     Cranial Nerves: No cranial nerve deficit.     Sensory: No sensory deficit.     Motor: No weakness.     Coordination: Coordination normal.     Gait: Gait normal.     Deep Tendon Reflexes: Reflexes normal.  Psychiatric:        Mood and Affect: Mood normal.        Behavior: Behavior normal.        Thought Content: Thought content normal.        Judgment: Judgment normal.    LABS:      Latest Ref Rng & Units 07/02/2023   12:00 AM 06/25/2023   12:00 AM 06/20/2023   12:00 AM  CBC  WBC  3.9     4.2     3.1      Hemoglobin 12.0 - 16.0 9.8     10.4     10.3      Hematocrit 36 - 46 28     29     30       Platelets 150 - 400 K/uL 299     233     182         This result is from an external source.      Latest Ref Rng & Units 07/02/2023   12:00 AM 06/25/2023   12:00 AM 06/20/2023   12:00 AM  CMP  BUN 4 - 21 9     11     8       Creatinine 0.5 - 1.1 0.7     0.9     0.9      Sodium 137 - 147 124     126     125      Potassium 3.5 - 5.1 mEq/L 4.2     4.4     4.2      Chloride 99 - 108 90     93     90      CO2 13 - 22 29     30     29       Calcium 8.7 - 10.7 9.4     10.1     9.6      Alkaline Phos 25 - 125 135     107     105      AST 13 - 35 41     40     37      ALT 7 - 35 U/L 20     24     21          This result is from an external source.   Component Ref Range & Units 03/05/2023 4 yr ago  TSH 0.350 - 4.500 uIU/mL 2.472 1.890 R   Lab Results  Component Value Date   CEA1 1.6 10/11/2021   /  CEA  Date Value Ref Range Status  10/11/2021 1.6 0.0 - 4.7 ng/mL Final    Comment:    (NOTE)  Nonsmokers          <3.9                             Smokers             <5.6 Roche [employer] Electrochemiluminescence Immunoassay (ECLIA) Values obtained with different assay methods or kits cannot be used interchangeably.  Results cannot  be interpreted as absolute evidence of the presence or absence of malignant disease. Performed At: [facility] [address] [place], [place] [id number] Jolene Schimke MD NW:[id number]    Component Ref Range & Units 03/05/2023 6 mo ago 4 yr ago  Cholesterol 0 - 200 mg/dL 578 High  469   Triglycerides <150 mg/dL 629 High  528 413 High  R  HDL >40 mg/dL 51 44 43 R  Total CHOL/HDL Ratio RATIO 4.4 4.5 4.0 R, CM  VLDL 0 - 40 mg/dL 36 29   LDL Cholesterol 0 - 99 mg/dL 244 High  010 High  CM 86     No results found for: "PSA1" No results found for: "UVO536" No results found for: "CAN125"  No results found for: "TOTALPROTELP", "ALBUMINELP", "A1GS", "A2GS", "BETS", "BETA2SER", "GAMS", "MSPIKE", "SPEI" Lab Results  Component Value Date   FERRITIN 387 (H) 07/02/2023   No results found for: "LDH"  STUDIES:   Exam: 03/31/2023 CT Chest, Abdomen, and Pelvis with Contrast Impression: Moderate progression of hepatic metastasis, as evidence by development of innumerable bilobar lesions and enlargement of previously detailed lesions.  a mild enlargement of moderate right pleural effusion. New tiny left pleural effusions.  Similar right middle lobe pulmonary nodule.     Exam: 11/07/2022 Nuclear Medicine PET skull Base to Thigh Impression: Widespread metastatic disease. Findings could be due to metastatic breast cancer or lung cancer. There is a sizable right lower lobe/right infrahilar mass which is more suggestive of a recurrent lung cancer with associated mediastinal and hilar adenopathy. Metastatic pulmonary and pleural nodules, metastatic hepatic and osseous lesions and muscle metastasis. Large right pleural effusion is likely malignant.  Other incidental findings as detailed.         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  Report date is 11/29/21.   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   Mixed dyslipidemia 11/03/2018   Numbness  in right leg    Hx: of   Obesity (BMI 30.0-34.9) 09/21/2020   Other specified disorders of bone density and structure, multiple sites 09/20/2020   Paroxysmal atrial fibrillation (HCC) 02/13/2016   Personal history of COVID-19 09/2019   Pleural effusion, 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   Pneumonia    Hx: of 'a long time ago"   Postoperative examination 02/05/2016    Past Surgical History:  Procedure Laterality Date   APPENDECTOMY     BACK SURGERY     BREAST SURGERY     Hx: of lumpectomy left breast 2007   COLONOSCOPY     Hx: of   DILATION AND CURETTAGE OF UTERUS     TONSILLECTOMY     TOTAL HIP ARTHROPLASTY Right 04/13/2013   Dr Magnus Ivan   TOTAL HIP ARTHROPLASTY Right 04/13/2013   Procedure: RIGHT  TOTAL HIP ARTHROPLASTY ANTERIOR APPROACH;  Surgeon: Kathryne Hitch, MD;  Location: [facility] OR;  Service: Orthopedics;  Laterality: Right;   TUBAL LIGATION      Family History  Problem Relation Age of Onset   Hypertension Sister    Cancer Sister 32       uterine or ovarian   Bone cancer Sister 75   Breast cancer Sister        dx 67 and 76   Uterine cancer Sister 67   Colon cancer Sister 31   Breast cancer Sister 52   Hypertension Brother    Kidney cancer Brother 64   Cancer Brother 42       unknown type; mets   Liver cancer Brother    Liver cancer Brother 53   Pancreatic cancer Niece 85   Breast cancer Niece 74   Colon cancer Nephew 57       mets    Social History:  reports that she has never smoked. She has never used smokeless tobacco. She reports that she does not drink alcohol and does not use drugs.The patient is alone today.  Allergies:  Allergies  Allergen Reactions   Codeine     unknown    Sulfa Antibiotics     unknown   Sulfacetamide Sodium Other (See Comments)    unknown   Chlorhexidine Rash    Current Medications: Current Outpatient Medications  Medication Sig Dispense Refill   abemaciclib (VERZENIO) 100 MG tablet Take 1 tablet (100 mg total) by mouth daily. 30 tablet 0   apixaban (ELIQUIS) 5 MG TABS tablet TAKE 1 TABLET BY MOUTH TWICE A DAY 180 tablet 1   atorvastatin (LIPITOR) 10 MG tablet Take 1 tablet by mouth every other day.  4   benzonatate (TESSALON) 100 MG capsule Take 1 capsule (100 mg total) by mouth 3 (three) times daily as needed for cough. (Patient not taking: Reported on 06/18/2023) 20 capsule 5   calcium carbonate (OSCAL) 1500 (600 Ca) MG TABS tablet Take 600 mg of elemental calcium by mouth daily with breakfast.     cholecalciferol (VITAMIN D) 1000 UNITS tablet Take 1,000 Units by mouth daily.     denosumab (XGEVA) 120 MG/1.7ML SOLN injection Inject 120 mg into the skin as directed. Once every 4 weeks     Flaxseed, Linseed, OIL Take 1  capsule by mouth daily.     Fulvestrant (FASLODEX IM) Inject into the muscle. 500 mg injection every 4 weeks     glucosamine-chondroitin 500-400 MG tablet Take 1 tablet by mouth 2 (two) times daily.      levothyroxine (SYNTHROID, LEVOTHROID) 75 MCG tablet Take 75  mcg by mouth daily.   7   metoprolol tartrate (LOPRESSOR) 25 MG tablet TAKE 1 TABLET (25 MG TOTAL) BY MOUTH 2 (TWO) TIMES DAILY. PATIENT NEEDS APPOINTMENT FOR FURTHER REFILLS. 1 ST ATTEMPT 60 tablet 0   Multiple Vitamin (MULTIVITAMIN WITH MINERALS) TABS Take 1 tablet by mouth daily.     ondansetron (ZOFRAN) 4 MG tablet Take 1 tablet (4 mg total) by mouth every 4 (four) hours as needed for nausea or vomiting. 30 tablet 5   prochlorperazine (COMPAZINE) 10 MG tablet Take 1 tablet (10 mg total) by mouth every 6 (six) hours as needed for nausea or vomiting. 30 tablet 5   No current facility-administered medications for this visit.     I,Oluwatobi Asade,acting as a scribe for Dellia Beckwith, MD.,have documented all relevant documentation on the behalf of Dellia Beckwith, MD,as directed by  Dellia Beckwith, MD while in the presence of Dellia Beckwith, MD.

## 2023-06-14 LAB — CANCER ANTIGEN 27.29: CA 27.29: 414.5 U/mL — ABNORMAL HIGH (ref 0.0–38.6)

## 2023-06-16 ENCOUNTER — Other Ambulatory Visit (HOSPITAL_COMMUNITY): Payer: Self-pay

## 2023-06-17 ENCOUNTER — Telehealth: Payer: Self-pay | Admitting: Dietician

## 2023-06-17 NOTE — Progress Notes (Signed)
[facility]  [address] [place],  [place]  [address] [phone]  Oral Chemotherapy Pharmacist Encounter  Clinic Day: 05/14/2023   Referring physician: Gery Pray, MD  Patient added on to my schedule for 05/14/2023 due to patient having significant diarrhea that she was not able to get to the restroom in time. We discussed the Imodium and how to take it and we discussed trying to take it daily at this time to see if this will help control the diarrhea better. I discussed with patient that when starting the imodium daily that if she has not had a bowel movement after a day or two that she should not take that imodium that day. Patient verbalized understanding on how to control the diarrhea without then causing constipation.  With her having diarrhea, we also got labs drawn to make sure she was not significantly dehydrated.    Latest Reference Range & Units 04/30/23 13:05 05/14/23 13:10  Sodium 135 - 145 mmol/L 139 131 (L)  Potassium 3.5 - 5.1 mmol/L 4.2 4.3  Chloride 98 - 111 mmol/L 105 98  CO2 22 - 32 mmol/L 25 24  Glucose 70 - 99 mg/dL 85 99  BUN 8 - 23 mg/dL 14 11  Creatinine 4.69 - 1.00 mg/dL 6.29 (H) 5.28 (H)  Calcium 8.9 - 10.3 mg/dL 9.1 9.0  Anion gap 5 - 15  9 9   Alkaline Phosphatase 38 - 126 U/L 37 (L) 45  Albumin 3.5 - 5.0 g/dL 3.2 (L) 3.4 (L)  AST 15 - 41 U/L 21 26  ALT 0 - 44 U/L 15 16  Total Protein 6.5 - 8.1 g/dL 6.4 (L) 6.7  Total Bilirubin 0.3 - 1.2 mg/dL 0.3 0.4    Latest Reference Range & Units 04/30/23 13:05 05/14/23 13:10  WBC 4.0 - 10.5 K/uL 3.4 (L) 2.6 (L)  RBC 3.87 - 5.11 MIL/uL 3.85 (L) 3.57 (L)  Hemoglobin 12.0 - 15.0 g/dL 41.3 24.4 (L)  HCT 01.0 - 46.0 % 37.0 33.6 (L)  MCV 80.0 - 100.0 fL 96.1 94.1  MCH 26.0 - 34.0 pg 31.4 31.4  MCHC 30.0 - 36.0 g/dL 27.2 53.6  RDW 64.4 - 03.4 % 13.8 14.4  Platelets 150 - 400 K/uL 130 (L) 205   All questions answered.  Patient voiced understanding and appreciation. I spent 15  minutes of face to face discussion. Patient aware of next appointment and knows to call the office if the diarrhea is still uncontrolled.   Bethel Born, PharmD Hematology/Oncology Clinical Pharmacist [facility] [phone]

## 2023-06-17 NOTE — Telephone Encounter (Signed)
Patient screened on MST. First attempt to reach. There was no answer at home# and I tried sending a text but it is a land line.  Will try again to set up a nutrition consult.  Gennaro Africa, RDN, LDN Registered Dietitian, [facility] Part Time Remote (Usual office hours: Tuesday-Thursday) Cell: [phone]

## 2023-06-17 NOTE — Progress Notes (Signed)
[facility]  [address] 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, bone and lung/pleura   Current Treatment: Elacestrant oral chemotherapy with monthly Xgeva   HISTORY OF PRESENT ILLNESS:       Oncology History  Malignant neoplasm of upper-outer quadrant of left female breast (HCC)  03/07/2006 Initial Diagnosis    Breast cancer, left breast (HCC)    03/17/2006 Cancer Staging    Staging form: Breast, AJCC 6th Edition - Clinical stage from 03/17/2006: Stage IIIA (T1c, N2a, M0) - Signed by Dellia Beckwith, MD on 12/15/2020 Staged by: Managing physician Diagnostic confirmation: Positive histology Specimen type: Excision Histopathologic type: Infiltrating duct carcinoma, NOS Tumor size (mm): 15 Laterality: Left Total positive nodes: 5 Histologic grade (G): G1 Residual tumor (R): R0 - None Stage prefix: Initial diagnosis Lymphatic vessel invasion (L): L0 - No lymphatic vessel invasion Venous invasion (V): V0 - No venous invasion Prognostic indicators: ER/PR pos, HER 2 neg., Rx with AC x 3 mo, then Taxol x 3 mo.,AI x 10 yrs        INTERVAL HISTORY:  Carrie Wilkerson is here today for repeat clinical assessment of recurrent hormone receptor positive 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 She has been on Orserdu for roughly 1 month and is tolerating it with no difficulties.   LABS:    Latest Reference Range & Units 12/11/22 14:05 12/25/22 14:06  Sodium 135 - 145 mmol/L 140 141  Potassium 3.5 - 5.1 mmol/L 4.0 3.7  Chloride 98 - 111 mmol/L 104 105  CO2 22 - 32 mmol/L 28 27  Glucose 70 - 99 mg/dL 010 (H) 272 (H)  BUN 8 - 23 mg/dL 19 15  Creatinine 5.36 - 1.00 mg/dL 6.44 0.34  Calcium 8.9 - 10.3 mg/dL 9.2  9.1  Anion gap 5 - 15  8 9   Alkaline Phosphatase 38 - 126 U/L 44 44  Albumin 3.5 - 5.0 g/dL 3.6 3.7  AST 15 - 41 U/L 24 24  ALT 0 - 44 U/L 18 14  Total Protein 6.5 - 8.1 g/dL 6.9 6.9  Total Bilirubin 0.3 - 1.2 mg/dL 0.4 0.5    Latest Reference Range & Units 12/25/22 14:06  WBC 4.0 - 10.5 K/uL 6.3  RBC 3.87 - 5.11 MIL/uL 4.26  Hemoglobin 12.0 - 15.0 g/dL 74.2  HCT 59.5 - 63.8 % 42.1  MCV 80.0 - 100.0 fL 98.8  MCH 26.0 - 34.0 pg 31.7  MCHC 30.0 - 36.0 g/dL 75.6  RDW 43.3 - 29.5 % 14.0  Platelets 150 - 400 K/uL 228       HISTORY:        Past Medical History:  Diagnosis Date   Arthritis     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  Report date is 11/29/21.   The CustomNext-Cancer+RNAinsight panel offered by [employer] includes sequencing and rearrangement analysis for the following 47 genes:  APC, ATM, AXIN2, BARD1, BMPR1A, BRCA1, BRCA2, BRIP1, CDH1, CDK4, CDKN2A, CHEK2, DICER1, EPCAM, GREM1, HOXB13,    History of left breast cancer 06/13/2021   Hypotension 06/13/2021   Incisional hernia, without obstruction or gangrene 10/22/2016   Malignant neoplasm of upper-outer quadrant of left female breast (HCC) 03/07/2006   Mixed dyslipidemia 11/03/2018   Numbness in right leg      Hx: of   Obesity (BMI 30.0-34.9) 09/21/2020   Other specified disorders of bone density and structure, multiple sites 09/20/2020   Paroxysmal atrial fibrillation (HCC) 02/13/2016   Personal history of COVID-19  09/2019   Pleural effusion, 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   Pneumonia      Hx: of 'a long time ago"   Postoperative examination 02/05/2016               Past Surgical History:  Procedure Laterality Date   APPENDECTOMY       BACK SURGERY       BREAST SURGERY        Hx: of lumpectomy left breast 2007   COLONOSCOPY        Hx: of   DILATION AND CURETTAGE OF UTERUS       TONSILLECTOMY       TOTAL HIP ARTHROPLASTY Right 04/13/2013    Dr Magnus Ivan   TOTAL HIP ARTHROPLASTY Right 04/13/2013    Procedure: RIGHT TOTAL HIP ARTHROPLASTY ANTERIOR APPROACH;  Surgeon: Kathryne Hitch, MD;  Location: [facility];  Service: Orthopedics;  Laterality: Right;   TUBAL LIGATION                   Family History  Problem Relation Age of Onset   Hypertension Sister     Cancer Sister 61        uterine or ovarian   Bone cancer Sister 41   Breast cancer Sister          dx 15 and 45   Uterine cancer Sister 69   Colon cancer Sister 54   Breast cancer Sister 74   Hypertension Brother     Kidney cancer Brother 45   Cancer Brother 33        unknown type; mets   Liver cancer Brother     Liver cancer Brother 70   Pancreatic cancer Niece 70   Breast cancer Niece 47   Colon cancer Nephew 57        mets          Social History:  reports that she has never smoked. She has never used smokeless tobacco. She reports that she does not drink alcohol and does not use drugs.The patient is alone today.   Allergies:  Allergies       Allergies  Allergen Reactions   Codeine        unknown     Sulfa Antibiotics        unknown   Sulfacetamide Sodium Other (See Comments)      unknown   Chlorhexidine Rash        Current Medications:       Current Outpatient Medications  Medication Sig Dispense Refill   apixaban (ELIQUIS) 5 MG TABS tablet Take 1 tablet (5 mg total) by mouth 2 (two) times daily. 180 tablet 3   atorvastatin (LIPITOR) 10 MG tablet Take 1 tablet by mouth every  other day.   4   benzonatate  (TESSALON) 100 MG capsule Take 1 capsule (100 mg total) by mouth 3 (three) times daily as needed for cough. 20 capsule 5   calcium carbonate (OSCAL) 1500 (600 Ca) MG TABS tablet Take 600 mg of elemental calcium by mouth daily with breakfast.       cholecalciferol (VITAMIN D) 1000 UNITS tablet Take 1,000 Units by mouth daily.       denosumab (PROLIA) 60 MG/ML SOLN injection Inject 60 mg into the skin every 6 (six) months. Administer in upper arm, thigh, or abdomen       elacestrant hydrochloride (ORSERDU) 345 MG tablet Take 1 tablet (345 mg total) by mouth daily. Take with food. 30 tablet 5   Flaxseed, Linseed, OIL Take 1 capsule by mouth daily.       glucosamine-chondroitin 500-400 MG tablet Take 1 tablet by mouth 2 (two) times daily.        levothyroxine (SYNTHROID, LEVOTHROID) 75 MCG tablet Take 75 mcg by mouth daily.    7   metoprolol tartrate (LOPRESSOR) 25 MG tablet Take 1 tablet (25 mg total) by mouth 2 (two) times daily. 180 tablet 0   Multiple Vitamin (MULTIVITAMIN WITH MINERALS) TABS Take 1 tablet by mouth daily.       ondansetron (ZOFRAN) 4 MG tablet Take 1 tablet (4 mg total) by mouth every 4 (four) hours as needed for nausea or vomiting. 30 tablet 5   prochlorperazine (COMPAZINE) 10 MG tablet Take 1 tablet (10 mg total) by mouth every 6 (six) hours as needed for nausea or vomiting. 30 tablet 5      No current facility-administered medications for this visit.        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 CT chest in December revealed right middle and lower lobe collapse with large right pleural effusion. PET was negative.  She required thoracentesis on 2 occasions, the last in January.  Cytology revealed malignant cells consistent with breast primary, ER/PR positive.  She was started on anastrozole 1 mg daily on January  5.  Oral chemotherapy with palbociclib 125 mg was added in February.  After 1 cycle, she developed severe neutropenia. Her 2nd cycle was postponed 1 week.  Prior to a 3rd cycle, she had recurrent neutropenia, so her chemotherapy was held for 2 weeks and the palbociclib dose reduced to 100 mg daily.  She had persistent neutropenia and the dose was decreased to 75mg  daily.  CT chest, abdomen and pelvis on July 24 revealed a decrease in the large right pleural effusion and compressive right lung atelectasis. Now her scan in January, 2024 reveals a new nodule in  the right middle lobe measuring 7mm. But more concerning are the new lesions in the liver as of February 1st, 2024. She still has a large right pleural effusion that remains stable.   Right Lung Mass She has a hypermetabolic right infrahilar mass measuring 2.4cm with an SUV 16.87. This was found to be metastatic breast cancer. She has never smoked. She had the ESR1 mutation and was started on Orserdu.    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 I think these are likely metastatic but I recommend a PET to be sure. At  the same time this will also evaluate for any new metastatic disease. I think she is strong enough and willing to pursue further treatment and we discussed a few options.  We therefore obtained a liver biopsy to confirm the diagnosis and this is positive for metastatic breast cancer, ER positive and HER2 negative.    Malignant neoplasm of upper-outer quadrant of left female breast [facility]) Remote history of stage IIIA hormone receptor positive breast cancer diagnosed in June 2007, treated with surgery, chemotherapy, radiation and 10 years of adjuvant endocrine therapy.  She now has recurrent disease.   Malignant Neoplasm Metastatic to Bone On PET scans she has multiple bone lesions including the right 11th  rib, left iliac bone, and left sacrum. She also has metastases noted in muscle tissue, bilateral paraspinal musculature. She is now on Xgeva every 4 weeks.    Other specified disorders of bone density and structure, multiple sites Osteopenia, for which she has been receiving denosumab every 6 months, next due in September.  She continues calcium and vitamin-D as recommended.  She had a repeat bone density in December, 2023, which is stable.  Her last dose was given in September. She will now change to monthly injections due to her bone metastases.    Plan  She has been on Orserdu for one month and is tolerating it with no difficulties. She will see Dr. Gilman Buttner for next follow up visit with CBC and CMP on 01/08/2023. The patient understands the plans discussed today and is in agreement with them.  I reviewed all of this information with her today and answered her questions.  She knows to contact our office if she develops concerns prior to her next appointment.   I provided 15 minutes of face-to-face time during this encounter and > 50% was spent counseling as documented under my assessment and plan.    Bethel Born, PharmD Hematology/Oncology Clinical Pharmacist [facility] [phone]

## 2023-06-18 ENCOUNTER — Encounter: Payer: Self-pay | Admitting: Cardiology

## 2023-06-18 ENCOUNTER — Other Ambulatory Visit: Payer: Medicare HMO

## 2023-06-18 ENCOUNTER — Ambulatory Visit: Payer: Medicare HMO | Attending: Cardiology | Admitting: Cardiology

## 2023-06-18 ENCOUNTER — Ambulatory Visit: Payer: Medicare HMO

## 2023-06-18 VITALS — BP 110/60 | HR 81 | Ht 62.0 in | Wt 147.0 lb

## 2023-06-18 DIAGNOSIS — I700 Atherosclerosis of aorta: Secondary | ICD-10-CM | POA: Insufficient documentation

## 2023-06-18 DIAGNOSIS — J910 Malignant pleural effusion: Secondary | ICD-10-CM | POA: Diagnosis not present

## 2023-06-18 DIAGNOSIS — I480 Paroxysmal atrial fibrillation: Secondary | ICD-10-CM

## 2023-06-18 DIAGNOSIS — E782 Mixed hyperlipidemia: Secondary | ICD-10-CM | POA: Diagnosis not present

## 2023-06-18 NOTE — Progress Notes (Signed)
Cardiology Office Note:    Date:  06/18/2023   ID:  Carrie Wilkerson, DOB 30-May-1941, MRN [id number]  PCP:  Philemon Kingdom, MD  Cardiologist:  Garwin Brothers, MD   Referring MD: Philemon Kingdom, MD    ASSESSMENT:    1. Paroxysmal atrial fibrillation (HCC)   2. Mixed dyslipidemia   3. Aortic atherosclerosis (HCC)   4. Pleural effusion, malignant    PLAN:    In order of problems listed above:  Aortic atherosclerosis: I discussed this with the patient at length.  This was evident on the CT scan report.  Secondary prevention stressed.  She is on statin therapy. Paroxysmal atrial fibrillation:I discussed with the patient atrial fibrillation, disease process. Management and therapy including rate and rhythm control, anticoagulation benefits and potential risks were discussed extensively with the patient. Patient had multiple questions which were answered to patient's satisfaction. Essential hypertension: Blood pressure stable and diet was emphasized. Mixed dyslipidemia: On lipid-lowering medications followed by primary care.  Lipids reviewed. Cancer with metastasis: Notes reviewed by specialists for taking care of her. Patient will be seen in follow-up appointment in 9 months or earlier if the patient has any concerns.    Medication Adjustments/Labs and Tests Ordered: Current medicines are reviewed at length with the patient today.  Concerns regarding medicines are outlined above.  Orders Placed This Encounter  Procedures   EKG 12-Lead   No orders of the defined types were placed in this encounter.    No chief complaint on file.    History of Present Illness:    Carrie Wilkerson is a 82 y.o. female.  Patient has past medical history of aortic atherosclerosis, essential hypertension, mixed dyslipidemia and cancer that has spread to the liver and other organs.  She denies any problems at this time and takes care of activities of daily living.  No  chest pain orthopnea or PND.  She is followed by our oncologist on a regular basis.  At the time of my evaluation, the patient is alert awake oriented and in no distress.  She has history of paroxysmal atrial fibrillation.  She is stable with her gait.  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  Report date is 11/29/21.   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   Mixed dyslipidemia 11/03/2018   Numbness in right leg    Hx: of  Obesity (BMI 30.0-34.9) 09/21/2020   Other specified disorders of bone density and structure, multiple sites 09/20/2020   Paroxysmal atrial fibrillation (HCC) 02/13/2016   Personal history of COVID-19 09/2019   Pleural effusion, 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   Pneumonia    Hx: of 'a long  time ago"   Postoperative examination 02/05/2016    Past Surgical History:  Procedure Laterality Date   APPENDECTOMY     BACK SURGERY     BREAST SURGERY     Hx: of lumpectomy left breast 2007   COLONOSCOPY     Hx: of   DILATION AND CURETTAGE OF UTERUS     TONSILLECTOMY     TOTAL HIP ARTHROPLASTY Right 04/13/2013   Dr Magnus Ivan   TOTAL HIP ARTHROPLASTY Right 04/13/2013   Procedure: RIGHT TOTAL HIP ARTHROPLASTY ANTERIOR APPROACH;  Surgeon: Kathryne Hitch, MD;  Location: [facility] OR;  Service: Orthopedics;  Laterality: Right;   TUBAL LIGATION      Current Medications: Current Meds  Medication Sig   abemaciclib (VERZENIO) 100 MG tablet Take 1 tablet (100 mg total) by mouth 2 (two) times daily. (Patient taking differently: Take 100 mg by mouth daily. Changed to daily 06/13/23.)   apixaban (ELIQUIS) 5 MG TABS tablet TAKE 1 TABLET BY MOUTH TWICE A DAY   atorvastatin (LIPITOR) 10 MG tablet Take 1 tablet by mouth every other day.   calcium carbonate (OSCAL) 1500 (600 Ca) MG TABS tablet Take 600 mg of elemental calcium by mouth daily with breakfast.   cholecalciferol (VITAMIN D) 1000 UNITS tablet Take 1,000 Units by mouth daily.   denosumab (XGEVA) 120 MG/1.7ML SOLN injection Inject 120 mg into the skin as directed. Once every 4 weeks   Flaxseed, Linseed, OIL Take 1 capsule by mouth daily.   Fulvestrant (FASLODEX IM) Inject into the muscle. 500 mg injection every 4 weeks   glucosamine-chondroitin 500-400 MG tablet Take 1 tablet by mouth 2 (two) times daily.    levothyroxine (SYNTHROID, LEVOTHROID) 75 MCG tablet Take 75 mcg by mouth daily.    metoprolol tartrate (LOPRESSOR) 25 MG tablet TAKE 1 TABLET (25 MG TOTAL) BY MOUTH 2 (TWO) TIMES DAILY. PATIENT NEEDS APPOINTMENT FOR FURTHER REFILLS. 1 ST ATTEMPT   Multiple Vitamin (MULTIVITAMIN WITH MINERALS) TABS Take 1 tablet by mouth daily.   ondansetron (ZOFRAN) 4 MG tablet Take 1 tablet (4 mg total) by mouth every 4 (four) hours as needed for  nausea or vomiting.   prochlorperazine (COMPAZINE) 10 MG tablet Take 1 tablet (10 mg total) by mouth every 6 (six) hours as needed for nausea or vomiting.     Allergies:   Codeine, Sulfa antibiotics, Sulfacetamide sodium, and Chlorhexidine   Social History   Socioeconomic History   Marital status: Married    Spouse name: Not on file   Number of children: Not on file   Years of education: Not on file   Highest education level: Not on file  Occupational History   Not on file  Tobacco Use   Smoking status: Never   Smokeless tobacco: Never  Vaping Use   Vaping status: Never Used  Substance and Sexual Activity   Alcohol use: No   Drug use: No   Sexual activity: Not on file  Other Topics Concern   Not on file  Social History Narrative   Not on file   Social Determinants of Health   Financial Resource Strain: Not on file  Food Insecurity: Not on file  Transportation Needs: Not on file  Physical Activity:  Not on file  Stress: Not on file  Social Connections: Not on file     Family History: The patient's family history includes Bone cancer (age of onset: 21) in her sister; Breast cancer in her sister; Breast cancer (age of onset: 7) in her niece; Breast cancer (age of onset: 18) in her sister; Cancer (age of onset: 65) in her sister; Cancer (age of onset: 54) in her brother; Colon cancer (age of onset: 73) in her nephew; Colon cancer (age of onset: 33) in her sister; Hypertension in her brother and sister; Kidney cancer (age of onset: 23) in her brother; Liver cancer in her brother; Liver cancer (age of onset: 25) in her brother; Pancreatic cancer (age of onset: 44) in her niece; Uterine cancer (age of onset: 71) in her sister.  ROS:   Please see the history of present illness.    All other systems reviewed and are negative.  EKGs/Labs/Other Studies Reviewed:    The following studies were reviewed today: I discussed my findings with the patient .Marland KitchenEKG  Interpretation Date/Time:  Wednesday June 18 2023 14:00:35 EDT Ventricular Rate:  81 PR Interval:  146 QRS Duration:  84 QT Interval:  386 QTC Calculation: 448 R Axis:   89  Text Interpretation: Sinus rhythm with Premature atrial complexes Cannot rule out Anterior infarct , age undetermined Abnormal ECG No previous ECGs available Confirmed by Belva Crome [phone]) on 06/18/2023 2:34:36 PM     Recent Labs: 03/05/2023: TSH 2.472 06/13/2023: ALT 20; BUN 7; Creatinine 1.0; Hemoglobin 10.6; Platelets 206; Potassium 4.2; Sodium 128  Recent Lipid Panel    Component Value Date/Time   CHOL 226 (H) 03/05/2023 1058   CHOL 172 11/03/2018 0907   TRIG 182 (H) 03/05/2023 1058   HDL 51 03/05/2023 1058   HDL 43 11/03/2018 0907   CHOLHDL 4.4 03/05/2023 1058   VLDL 36 03/05/2023 1058   LDLCALC 139 (H) 03/05/2023 1058   LDLCALC 86 11/03/2018 0907    Physical Exam:    VS:  BP 110/60   Pulse 81   Ht 5\' 2"  (1.575 m)   Wt 147 lb (66.7 kg)   SpO2 95%   BMI 26.89 kg/m     Wt Readings from Last 3 Encounters:  06/18/23 147 lb (66.7 kg)  06/13/23 149 lb (67.6 kg)  06/13/23 148 lb 6.4 oz (67.3 kg)     GEN: Patient is in no acute distress HEENT: Normal NECK: No JVD; No carotid bruits LYMPHATICS: No lymphadenopathy CARDIAC: Hear sounds regular, 2/6 systolic murmur at the apex. RESPIRATORY:  Clear to auscultation without rales, wheezing or rhonchi  ABDOMEN: Soft, non-tender, non-distended MUSCULOSKELETAL:  No edema; No deformity  SKIN: Warm and dry NEUROLOGIC:  Alert and oriented x 3 PSYCHIATRIC:  Normal affect   Signed, Garwin Brothers, MD  06/18/2023 2:43 PM    [facility]

## 2023-06-18 NOTE — Patient Instructions (Signed)

## 2023-06-19 ENCOUNTER — Ambulatory Visit: Payer: Medicare HMO

## 2023-06-19 ENCOUNTER — Other Ambulatory Visit: Payer: Self-pay | Admitting: Cardiology

## 2023-06-19 ENCOUNTER — Other Ambulatory Visit: Payer: Medicare HMO

## 2023-06-19 ENCOUNTER — Other Ambulatory Visit (HOSPITAL_COMMUNITY): Payer: Self-pay

## 2023-06-19 NOTE — Progress Notes (Signed)
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 CT chest in December revealed right middle and lower lobe collapse with large right pleural effusion. PET was negative.  She required thoracentesis on 2 occasions, the last in January.  Cytology revealed malignant cells consistent with breast primary, ER/PR positive.  She was started on anastrozole 1 mg daily on October 11, 2021.  Oral chemotherapy with palbociclib 125 mg was added in February.  After 1 cycle, she developed severe neutropenia. Her 2nd cycle was postponed 1 week.  Prior to a 3rd cycle, she had recurrent neutropenia, so her chemotherapy was held for 2 weeks and the palbociclib dose reduced to 100 mg daily.  She had persistent neutropenia and the dose was decreased to 75mg  daily.  CT chest, abdomen and pelvis on July 24 revealed a decrease in the large right pleural effusion and compressive right lung atelectasis. Now her scan in January, 2024 reveals a new nodule in  the right middle lobe measuring 7mm. But more concerning are the new lesions in the liver as of February 1st, 2024. She still has a large right pleural effusion that remains stable. 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 Restaging scans in June, 2024 revealed progression of her liver metastasis and treatment was changed to Faslodex injections and Verzenio at 100 mg twice daily. She does not seem to be tolerating this very well and so we will lower the dose to once daily to see if this helps. It is possible that her bad taste and decreased appetite are related to progressive malignancy in the liver and lung.    Right Lung  Mass She has a hypermetabolic right infrahilar mass measuring 2.4cm with an SUV 16.87 found in February, 2024. This was found to be metastatic breast cancer. She has never smoked.  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 The lung nodule decreased from 7mm to 6mm, but she now has increased liver lesions.    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 We therefore obtained a liver biopsy to confirm the diagnosis and this is positive for metastatic breast cancer, ER positive and HER2 negative. 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  Her restaging scans now show moderate progression of her liver metastasis with innumerable lesions in both lobes including multiple new lesions. Fortunately she is asymptomatic from this and her liver function tests are essentially normal. We will therefore stop the Orserdu and change her to Faslodex injections 500mg  monthly along with Verzenio 100mg  BID on April 04, 2023. We started at a reduced dose of this due to her advanced age and multiple prior treatments. I decreased the Verzenio to once daily in early September and we have now stopped it since she still had severe toxicities of nausea, anorexia and bad taste with significant weight loss.    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  She had recurrent disease found in December 2022.   Malignant Neoplasm Metastatic to Bone On PET scans she has multiple bone lesions including the right 11th rib, left iliac bone, and left sacrum. She also has metastases noted in muscle tissue,  bilateral paraspinal musculature. She is now on Xgeva every 4 weeks. Fortunately she is asymptomatic from her bone metastasis.    Osteopenia Osteopenia, for which she has been receiving denosumab every 6 months, next due in September.  She continues calcium and vitamin-D as recommended.  She had a repeat bone density in December, 2023, which is stable.  Her last dose was given in September. She bolus changed to monthly injections due to her bone metastases.    Lymphedema This is 1+ of her left upper extremity, noted since 2023.  I gave her some literature about the diagnosis of lymphedema.  I find no adenopathy or tumor of the axilla.     Hyponatremia This is fairly severe today and has decreased from 128 to 125. We will give her IV normal saline today and repeat the dexamethasone 8mg  IV. I will check a urine osmolality for SIADH. I encouraged her to include salt in her diet and Gatorade instead of free water. Unfortunately she is not fond of that. We will need to monitor and manage this closely to avoid hospitalization.   Anemia This is slightly worse with a hemoglobin of 10.3 today and likely related to her Verzenio.   Leukopenia Her WBC is a little better at 3.1 with a mildly low ANC of 1550.  This has improved with the decrease in her Verzenio dose.   Plan: She will receive 1L IV of normal saline and and 8mg  IV dexamethasone.  She complains of a cough with green phlegm. She drinks about 1 ensure a day. I think her poor taste and appetite is a side effect of the Verzenio but one of the nurses has brought up that it could be possibly from her Faslodex injections. I will consider decreasing that to 50% dose as well but will discuss this with the pharmacist. Her day 1 cycle 4 of Faslodex is scheduled on 06/27/2023. We discussed possibly adding steroids for her appetite but I would like to hold off for now and see if the medication adjustments will help. Her WBC is 3.1, hemoglobin dropped from 10.6  to 10.3, and platelet count is 182,000 as of today. Her sodium low dropped from 128 to 125 and her AST increased from 34 to 39. I will add a urine osmolality today to rule out SIADH. She may need oral fluid restriction. She may need IV fluids next Wednesday and Friday if her sodium remains this low. The Verzenio was started on June, 28th along with the Faslodex injections.We tried decreasing the dose to once daily but she still had severe toxicities of nausea, anorexia and bad taste with significant weight loss.  Her last scan was at the end of June and she will have repeat scans sometime in October. She will see Kelli next week with labs. The patient understands the plans discussed today and is in agreement with them.  I reviewed all of this information with her today and answered her questions.  She knows to contact our office if she develops concerns prior to her next appointment.  I provided 17 minutes of face-to-face time during this encounter and > 50% was  spent counseling as documented under my assessment and plan.   Dellia Beckwith, MD  [facility] [address] [place] [address] Dept: [phone] Dept Fax: [phone]   No orders of the defined types were placed in this encounter.    CHIEF COMPLAINT:  CC: Recurrent hormone receptor positive breast cancer  Current Treatment: Abemaciclib/fulvestrant/denosumab  HISTORY OF PRESENT ILLNESS:  Carrie Wilkerson is a 82 y.o. 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 She was treated with lumpectomy.  Pathology revealed a 1.5 cm, grade 1, invasive ductal carcinoma with 5 positive nodes.  Estrogen and progesterone receptors were positive and Her-2 Neu negative.  She received adjuvant chemotherapy with Adriamycin/Cytoxan for 3 months, followed by Taxol for 3 months.  She then received  adjuvant radiation to the left breast.  She was placed on letrozole in April 2008 and completed 10 years of adjuvant hormonal therapy in April 2018.  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   Repeat bone density in September 2017 revealed slight improvement in the bone density, with a T-score of -2.1 in the forearm and a T-score of -1.1 in the femur with, so she has continued denosumab every 6 months, in addition to calcium/vitamin D.  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    She had reversal of her colostomy in October 2017.  She has an incisional hernia in the abdomen, which does not bother her, so she has decided against surgical repair.  She has atrial fibrillation and is on apixaban 5 mg twice daily.  Bone density scan in September 2019 revealed improvement in the bone density with a T-score of -1.6 in forearm.  The bone density of the femur had normalized with a T-score of -0.8.  She has continued denosumab every 6 months.  Annual bilateral mammogram in December 2020 did not reveal any evidence of malignancy.  She contracted COVID-19 later in December 2020.  She did not have to be hospitalized and fully recovered.  Bone density from September 2021 revealed mildly worsened osteopenia with a T-score of -2.0 of the left forearm radius, previously -1.6.  Left femur neck is normal at -0.9, previously -0.8.  She continues calcium 1200 mg with vitamin-D daily.  She presented to the emergency department in December 2022 due to shortness of breath and left substernal area pain. ECHO revealed a normal EF between 60-65%. CT chest revealed right middle and lower lobe collapse with large right pleural effusion. There was no central obstructing mass lesion evident, or obvious right-sided pleural disease, although there is a irregular  focus of soft tissue attenuation along the medial right upper lobe pleura/right anterior mediastinum. There were scattered tiny bilateral pulmonary nodules measuring up to 4 mm. Thoracentesis was pursued yielding 1200 cc's of pleural fluid. Cytopathology confirmed malignant cells consistent with breast primary. CKAE1/3, GATA-3 and ER positive. HER2 was negative 1+. Estrogen receptor was positive at 90% and progesterone receptor was positive at 20%. Ki67 was <1%. MRI brain was negative for evidence of metastasis. PET imaging in January 2023 revealed no hypermetabolic activity to localize active metastatic breast carcinoma. Large right pleural effusion occupies 75% of the right hemithorax. Along the anteromedial margin of  the right diaphragm there is hypermetabolic linear thickening of the diaphragm. She had a 2nd thoracentesis in January.  She was placed on anastrozole and palbociclib 125 mg daily for 3 weeks on and 1 week off.  She did not required further thoracentesis.  The dose of palbociclib was reduced to 100 mg daily with the 3rd cycle due to neutropenia.  The CA 27-29 had decreased from 123.6 to 40.4.  The dose of palbociclib was decreased again to 75 mg daily for 3 weeks on and 1 week off with her 5th cycle due to neutropenia.   CT imaging in July 2023 revealed a decrease in the right pleural effusion and atelectasis without new or progressive disease.  She continued anastrozole/palbociclib which was eventually switched to 3 weeks on and 2 weeks off due to cytopenias.  Bone density scan in December 2023 revealed slightly worsened osteopenia with a T-score of -2.2 in the left forearm, previously -2.  Left femur bone density was normal.   Unfortunately, CT imaging in January 24 revealed a new nodule in the right middle love measuring 7 mm, with a persistent right pleural effusion, as well as 3 new hypoenhancing liver lesions consistent with metastasis, the largest measuring 18 mm.PET scan revealed  widespread metastatic disease with metastatic pulmonary and pleural nodules, metastatic hepatic nodules and bone metastasis.  There was a a sizable right lower lobe/right infrahilar mass felt to be more suggestive of a recurrent lung cancer with associated mediastinal and hilar adenopathy.  She underwent ultrasound-guided liver biopsy in February revealed metastatic carcinoma consistent with breast primary which was estrogen and progesterone receptor positive and HER2 negative.  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   CT imaging in June, 2024 revealed progression of hepatic metastasis with development of innumerable bilobar lesions and enlargement of the previous lesions, the largest now measuring 2.3 x 2.5 cm.  There was enlargement of the right pleural effusion with a new tiny left pleural effusion, with stable right middle lobe pulmonary nodule during 6 mm.  Apparently, the hypermetabolic right infrahilar mass measuring approximately 2.4 cm is not appreciated on CT.  Due to the progressive disease, surgery was discontinued.  She was placed on abemaciclib 100 mg twice daily with fulvestrant injections monthly and continued denosumab injections monthly.  She has tolerated this regimen well.  Oncology History  Malignant neoplasm of upper-outer quadrant of left female breast (HCC)  03/07/2006 Initial Diagnosis   Breast cancer, left breast (HCC)   03/17/2006 Cancer Staging   Staging form: Breast, AJCC 6th Edition - Clinical stage from 03/17/2006: Stage IIIA (T1c, N2a, M0) - Signed by Dellia Beckwith, MD on 12/15/2020 Staged by: Managing physician Diagnostic confirmation: Positive histology Specimen type: Excision Histopathologic type: Infiltrating duct carcinoma, NOS Tumor size (mm): 15 Laterality: Left Total positive nodes: 5 Histologic grade (G): G1 Residual tumor (R): R0 - None Stage prefix: Initial  diagnosis Lymphatic vessel invasion (L): L0 - No lymphatic vessel invasion Venous invasion (V): V0 - No venous invasion Prognostic indicators: ER/PR pos, HER 2 neg., Rx with AC x 3 mo, then Taxol x 3 mo.,AI x 10 yrs   04/04/2023 -  Chemotherapy   Patient is on Treatment Plan : BREAST Fulvestrant q28d     Malignant neoplasm metastatic to bone (HCC)  11/08/2022 Initial Diagnosis   Malignant neoplasm metastatic to bone (HCC)   04/04/2023 -  Chemotherapy   Patient is on Treatment  Plan : BREAST Fulvestrant q28d      INTERVAL HISTORY:  Cheryl is here today for repeat clinical assessment of recurrent hormone receptor positive breast cancer metastatic to liver, bone and lung/pleura which was found in December, 2022. She was placed on Anastrozole and Palbociclib in January, 2023 for over 1 year. 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 Repeat scans in June, 2024 once again showed progression so she has been changed to faslodex injection smonthly and Verzenio oral chemotherapy at 100 mg twice daily. I saw the patient last week with severe worsening of appetite associated with nausea, weakness, dehydration, and diarrhea. I decreased her Verzenio to dose to 100mg  once daily. Patient states that she feels a little better than last week and the IV fluids with steroids have helped. She will receive 1L IV of normal saline and and 8mg  IV dexamethasone.  She complains of a cough with green phlegm. She drinks about 1 ensure a day. I think her poor taste and appetite is a side effect of the Verzenio but one of the nurses has brought up that it could be possibly from her Faslodex injections. I will consider decreasing that to 50% dose as well but will discuss this with the pharmacist. Her day 1 cycle 4 of Faslodex is scheduled on 06/27/2023. We discussed possibly adding steroids for her appetite but I would  like to hold off for now and see if the medication adjustments will help. Her WBC is 3.1, hemoglobin dropped from 10.6 to 10.3, and platelet count is 182,000 as of today. Her sodium low dropped from 128 to 125 and her AST increased from 34 to 39. I will add a urine osmolality today to rule out SIADH. She may need oral fluid restriction. She may need IV fluids next Wednesday and Friday if her sodium remains this low. The Verzenio was started on June, 28th along with the Faslodex injections.  We tried decreasing the dose to once daily but she still had severe toxicities of nausea, anorexia and bad taste with significant weight loss.  Her last scan was at the end of June and she will have repeat scans sometime in October. She will see Kelli next week with labs. She denies signs of infection such as sore throat, sinus drainage, or urinary symptoms.  She denies fevers or recurrent chills. She denies pain. She denies nausea, vomiting, chest pain, dyspnea. Her appetite is poor and food still tastes bad to her and her weight has decreased 2 pounds over last week .  REVIEW OF SYSTEMS:  Review of Systems  Constitutional:  Positive for appetite change ((decreased appetite and bad taste) and fatigue (Mild). Negative for chills, diaphoresis, fever and unexpected weight change.  HENT:  Negative.  Negative for hearing loss, lump/mass, mouth sores, nosebleeds, sore throat, tinnitus, trouble swallowing and voice change.   Eyes: Negative.  Negative for eye problems and icterus.  Respiratory:  Positive for cough (with green phlegm) and shortness of breath (on exertion). Negative for chest tightness, hemoptysis and wheezing.   Cardiovascular: Negative.  Negative for chest pain, leg swelling and palpitations.  Gastrointestinal:  Positive for diarrhea (Intermittent, improved). Negative for abdominal distention, abdominal pain, blood in stool, constipation, nausea, rectal pain and vomiting.  Endocrine: Negative.  Negative for  hot flashes.  Genitourinary: Negative.  Negative for bladder incontinence, difficulty urinating, dyspareunia, dysuria, frequency, hematuria, menstrual problem,  nocturia, pelvic pain, vaginal bleeding and vaginal discharge.   Musculoskeletal:  Positive for arthralgias (chronic arthritic knee pain). Negative for back pain, flank pain, gait problem, myalgias, neck pain and neck stiffness.  Skin: Negative.  Negative for itching, rash and wound.  Neurological:  Positive for dizziness. Negative for extremity weakness, gait problem, headaches, light-headedness, numbness, seizures and speech difficulty.  Hematological: Negative.  Negative for adenopathy. Does not bruise/bleed easily.  Psychiatric/Behavioral: Negative.  Negative for confusion, decreased concentration, depression, sleep disturbance and suicidal ideas. The patient is not nervous/anxious.     VITALS:  Blood pressure 134/62, pulse 84, temperature (!) 97.5 F (36.4 C), temperature source Oral, resp. rate 18, height 5\' 2"  (1.575 m), weight 147 lb 8 oz (66.9 kg), SpO2 98%.  Wt Readings from Last 3 Encounters:  07/09/23 143 lb 11.2 oz (65.2 kg)  07/02/23 145 lb 12.8 oz (66.1 kg)  06/27/23 148 lb 1.9 oz (67.2 kg)    Body mass index is 26.98 kg/m.  Performance status (ECOG): 1 - Symptomatic but completely ambulatory  PHYSICAL EXAM:  Physical Exam Vitals and nursing note reviewed.  Constitutional:      General: She is not in acute distress.    Appearance: Normal appearance.  HENT:     Head: Normocephalic and atraumatic.     Mouth/Throat:     Mouth: Mucous membranes are moist.     Pharynx: Oropharynx is clear. No oropharyngeal exudate or posterior oropharyngeal erythema.  Eyes:     General: No scleral icterus.    Extraocular Movements: Extraocular movements intact.     Conjunctiva/sclera: Conjunctivae normal.     Pupils: Pupils are equal, round, and reactive to light.  Cardiovascular:     Rate and Rhythm: Normal rate and regular  rhythm.     Heart sounds: Normal heart sounds. No murmur heard.    No friction rub. No gallop.  Pulmonary:     Effort: Pulmonary effort is normal.     Breath sounds: Examination of the right-lower field reveals decreased breath sounds. Decreased breath sounds present. No wheezing, rhonchi or rales.  Chest:  Breasts:    Right: No inverted nipple, mass, nipple discharge or skin change.     Left: No inverted nipple, mass, nipple discharge or skin change.  Abdominal:     General: There is no distension.     Palpations: Abdomen is soft. There is no hepatomegaly, splenomegaly or mass.     Tenderness: There is no abdominal tenderness.     Hernia: A hernia is present. Hernia is present in the ventral area.     Comments: Chronic upper abdominal ventral wall hernia.  Liver is mildly enlarged at about 3-4cm below the right costal margin   Musculoskeletal:        General: Normal range of motion.     Cervical back: Normal range of motion and neck supple. No tenderness.     Right lower leg: No edema.     Left lower leg: No edema.     Comments: 1+ lymphedema of the left upper extremity   Lymphadenopathy:     Cervical: No cervical adenopathy.     Upper Body:     Right upper body: No supraclavicular or axillary adenopathy.     Left upper body: No supraclavicular adenopathy.     Lower Body: No right inguinal adenopathy. No left inguinal adenopathy.  Skin:    General: Skin is warm and dry.     Coloration: Skin is not jaundiced.  Findings: No rash.  Neurological:     Mental Status: She is alert and oriented to person, place, and time.     Cranial Nerves: No cranial nerve deficit.  Psychiatric:        Mood and Affect: Mood normal.        Behavior: Behavior normal.        Thought Content: Thought content normal.     LABS:      Latest Ref Rng & Units 07/09/2023    1:21 PM 07/02/2023   12:00 AM 06/25/2023   12:00 AM  CBC  WBC  3.9     3.9     4.2      Hemoglobin 12.0 - 16.0 10.0     9.8      10.4      Hematocrit 36 - 46 29     28     29       Platelets 150 - 400 K/uL 320     299     233         This result is from an external source.      Latest Ref Rng & Units 07/09/2023    1:21 PM 07/02/2023   12:00 AM 06/25/2023   12:00 AM  CMP  BUN 4 - 21 11     9     11       Creatinine 0.5 - 1.1 0.7     0.7     0.9      Sodium 137 - 147 123     124     126      Potassium 3.5 - 5.1 mEq/L 4.7     4.2     4.4      Chloride 99 - 108 89     90     93      CO2 13 - 22 30     29     30       Calcium 8.7 - 10.7 9.1     9.4     10.1      Alkaline Phos 25 - 125 185     135     107      AST 13 - 35 64     41     40      ALT 7 - 35 U/L 32     20     24         This result is from an external source.   Component Ref Range & Units 6 d ago (06/13/23) 3 wk ago (05/29/23) 10 mo ago (08/14/22) 1 yr ago (04/03/22) 1 yr ago (02/27/22) 1 yr ago (10/11/21)  CA 27.29 0.0 - 38.6 U/mL 414.5 High  422.5 High  CM 26.0 CM 36.4 CM 40.4 High  CM 123.6 High  CM     Lab Results  Component Value Date   CEA1 1.6 10/11/2021   /  CEA  Date Value Ref Range Status  10/11/2021 1.6 0.0 - 4.7 ng/mL Final    Comment:    (NOTE)                             Nonsmokers          <3.9                             Smokers             <  5.6 [employer] Electrochemiluminescence Immunoassay (ECLIA) Values obtained with different assay methods or kits cannot be used interchangeably.  Results cannot be interpreted as absolute evidence of the presence or absence of malignant disease. Performed At: [facility] [address] 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  Report date is 11/29/21.   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   Mixed dyslipidemia 11/03/2018   Numbness in right leg    Hx: of   Obesity (BMI 30.0-34.9) 09/21/2020   Other specified disorders of bone density and structure, multiple sites 09/20/2020   Paroxysmal atrial fibrillation (HCC) 02/13/2016   Personal history of COVID-19 09/2019   Pleural effusion, 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   Pneumonia    Hx: of 'a long time ago"   Postoperative examination 02/05/2016    Past Surgical History:  Procedure Laterality Date   APPENDECTOMY     BACK SURGERY     BREAST SURGERY     Hx: of lumpectomy left breast 2007   COLONOSCOPY      Hx: of   DILATION AND CURETTAGE OF UTERUS     TONSILLECTOMY     TOTAL HIP ARTHROPLASTY Right 04/13/2013   Dr Magnus Ivan   TOTAL HIP ARTHROPLASTY Right 04/13/2013   Procedure: RIGHT TOTAL HIP ARTHROPLASTY ANTERIOR APPROACH;  Surgeon: Kathryne Hitch, MD;  Location: [facility] OR;  Service: Orthopedics;  Laterality: Right;   TUBAL LIGATION      Family History  Problem Relation Age of Onset   Hypertension Sister    Cancer Sister 20       uterine or ovarian   Bone cancer Sister 7   Breast cancer Sister        dx 50 and 26   Uterine cancer Sister 44   Colon cancer Sister 53   Breast cancer Sister 68   Hypertension Brother    Kidney cancer Brother 62   Cancer Brother 28       unknown type; mets  Liver cancer Brother    Liver cancer Brother 47   Pancreatic cancer Niece 77   Breast cancer Niece 92   Colon cancer Nephew 57       mets    Social History:  reports that she has never smoked. She has never used smokeless tobacco. She reports that she does not drink alcohol and does not use drugs.The patient is alone today.  Allergies:  Allergies  Allergen Reactions   Codeine     unknown    Sulfa Antibiotics     unknown   Sulfacetamide Sodium Other (See Comments)    unknown   Chlorhexidine Rash    Current Medications: Current Outpatient Medications  Medication Sig Dispense Refill   apixaban (ELIQUIS) 5 MG TABS tablet TAKE 1 TABLET BY MOUTH TWICE A DAY 180 tablet 1   atorvastatin (LIPITOR) 10 MG tablet Take 1 tablet by mouth every other day.  4   calcium carbonate (OSCAL) 1500 (600 Ca) MG TABS tablet Take 600 mg of elemental calcium by mouth daily with breakfast.     cholecalciferol (VITAMIN D) 1000 UNITS tablet Take 1,000 Units by mouth daily.     denosumab (XGEVA) 120 MG/1.7ML SOLN injection Inject 120 mg into the skin as directed. Once every 4 weeks     dexamethasone (DECADRON) 0.5 MG/5ML solution Take 5 mLs (0.5 mg total) by mouth 2 (two) times daily. Swish for 2 minutes  and spit at least twice daily. May increase up to 4 times daily if needed. 100 mL 0   everolimus (AFINITOR) 5 MG tablet Take 1 tablet (5 mg total) by mouth daily. 30 tablet 5   exemestane (AROMASIN) 25 MG tablet Take 1 tablet (25 mg total) by mouth daily after breakfast. 30 tablet 5   Flaxseed, Linseed, OIL Take 1 capsule by mouth daily.     glucosamine-chondroitin 500-400 MG tablet Take 1 tablet by mouth 2 (two) times daily.      levothyroxine (SYNTHROID, LEVOTHROID) 75 MCG tablet Take 75 mcg by mouth daily.   7   metoprolol tartrate (LOPRESSOR) 25 MG tablet Take 1 tablet (25 mg total) by mouth 2 (two) times daily. 180 tablet 2   Multiple Vitamin (MULTIVITAMIN WITH MINERALS) TABS Take 1 tablet by mouth daily.     ondansetron (ZOFRAN) 4 MG tablet Take 1 tablet (4 mg total) by mouth every 4 (four) hours as needed for nausea or vomiting. 30 tablet 5   prochlorperazine (COMPAZINE) 10 MG tablet Take 1 tablet (10 mg total) by mouth every 6 (six) hours as needed for nausea or vomiting. 30 tablet 5   No current facility-administered medications for this visit.     I,Jasmine M Lassiter,acting as a scribe for Dellia Beckwith, MD.,have documented all relevant documentation on the behalf of Dellia Beckwith, MD,as directed by  Dellia Beckwith, MD while in the presence of Dellia Beckwith, MD.

## 2023-06-20 ENCOUNTER — Inpatient Hospital Stay (INDEPENDENT_AMBULATORY_CARE_PROVIDER_SITE_OTHER): Payer: Medicare HMO | Admitting: Oncology

## 2023-06-20 ENCOUNTER — Encounter: Payer: Self-pay | Admitting: Oncology

## 2023-06-20 ENCOUNTER — Other Ambulatory Visit: Payer: Self-pay | Admitting: Oncology

## 2023-06-20 ENCOUNTER — Inpatient Hospital Stay: Payer: Medicare HMO

## 2023-06-20 ENCOUNTER — Other Ambulatory Visit (HOSPITAL_COMMUNITY): Payer: Self-pay

## 2023-06-20 VITALS — BP 132/62 | HR 85 | Temp 97.5°F | Resp 16

## 2023-06-20 VITALS — BP 134/62 | HR 84 | Temp 97.5°F | Resp 18 | Ht 62.0 in | Wt 147.5 lb

## 2023-06-20 DIAGNOSIS — C787 Secondary malignant neoplasm of liver and intrahepatic bile duct: Secondary | ICD-10-CM

## 2023-06-20 DIAGNOSIS — Z79899 Other long term (current) drug therapy: Secondary | ICD-10-CM | POA: Diagnosis not present

## 2023-06-20 DIAGNOSIS — E871 Hypo-osmolality and hyponatremia: Secondary | ICD-10-CM

## 2023-06-20 DIAGNOSIS — Z170 Estrogen receptor positive status [ER+]: Secondary | ICD-10-CM

## 2023-06-20 DIAGNOSIS — C7800 Secondary malignant neoplasm of unspecified lung: Secondary | ICD-10-CM | POA: Diagnosis not present

## 2023-06-20 DIAGNOSIS — J910 Malignant pleural effusion: Secondary | ICD-10-CM | POA: Diagnosis not present

## 2023-06-20 DIAGNOSIS — C50412 Malignant neoplasm of upper-outer quadrant of left female breast: Secondary | ICD-10-CM

## 2023-06-20 DIAGNOSIS — D649 Anemia, unspecified: Secondary | ICD-10-CM | POA: Diagnosis not present

## 2023-06-20 DIAGNOSIS — C7951 Secondary malignant neoplasm of bone: Secondary | ICD-10-CM

## 2023-06-20 DIAGNOSIS — Z5111 Encounter for antineoplastic chemotherapy: Secondary | ICD-10-CM | POA: Diagnosis not present

## 2023-06-20 LAB — HEPATIC FUNCTION PANEL
ALT: 21 U/L (ref 7–35)
AST: 37 — AB (ref 13–35)
Alkaline Phosphatase: 105 (ref 25–125)
Bilirubin, Total: 1.1

## 2023-06-20 LAB — BASIC METABOLIC PANEL
BUN: 8 (ref 4–21)
CO2: 29 — AB (ref 13–22)
Chloride: 90 — AB (ref 99–108)
Creatinine: 0.9 (ref 0.5–1.1)
EGFR: 60
Glucose: 99
Potassium: 4.2 meq/L (ref 3.5–5.1)
Sodium: 125 — AB (ref 137–147)

## 2023-06-20 LAB — COMPREHENSIVE METABOLIC PANEL
Albumin: 3.8 (ref 3.5–5.0)
Calcium: 9.6 (ref 8.7–10.7)

## 2023-06-20 LAB — CBC AND DIFFERENTIAL
HCT: 30 — AB (ref 36–46)
Hemoglobin: 10.3 — AB (ref 12.0–16.0)
Neutrophils Absolute: 1.55
Platelets: 182 10*3/uL (ref 150–400)
WBC: 3.1

## 2023-06-20 LAB — CBC: RBC: 3.13 — AB (ref 3.87–5.11)

## 2023-06-20 LAB — OSMOLALITY, URINE: Osmolality, Ur: 393 mosm/kg (ref 300–900)

## 2023-06-20 MED ORDER — ABEMACICLIB 100 MG PO TABS
100.0000 mg | ORAL_TABLET | Freq: Every day | ORAL | 0 refills | Status: DC
  Filled 2023-06-23: qty 28, 28d supply, fill #0

## 2023-06-20 MED ORDER — SODIUM CHLORIDE 0.9 % IV SOLN: Freq: Once | INTRAVENOUS | Status: AC

## 2023-06-20 MED ORDER — DEXAMETHASONE SODIUM PHOSPHATE 10 MG/ML IJ SOLN
8.0000 mg | Freq: Once | INTRAMUSCULAR | Status: AC
  Administered 2023-06-20: 8 mg via INTRAVENOUS
  Filled 2023-06-20: qty 1

## 2023-06-20 NOTE — Addendum Note (Signed)
Addended by: Domenic Schwab on: 06/20/2023 11:41 AM   Modules accepted: Orders

## 2023-06-20 NOTE — Patient Instructions (Signed)
Dehydration, Older Adult Dehydration is a condition in which there is not enough water or other fluids in the body. This happens when a person loses more fluids than they take in. Important organs cannot work right without the right amount of fluids. Any loss of fluids from the body can cause dehydration. People 84 years of age or older have a higher risk of dehydration than younger adults. This is because in older age, the body: Is less able to keep the right amount of water. Does not respond well to temperature changes. Does not feel thirst easily or quickly. Dehydration can be mild, worse, or very bad. It should be treated right away to keep it from getting very bad. What are the causes? Conditions that cause loss of water or other fluids, such as: Watery poop (diarrhea). Vomiting. Sweating a lot. Fever. Infection. Peeing (urinating) a lot. Not drinking enough fluids. Medicines, such as medicines that take extra fluid out of the body (diuretics). Lack of safe drinking water. Not being able to get enough water and food. What increases the risk? Having a long-term (chronic) illness that has not been treated the right way, such as: Diabetes. Kidney, heart, or lung disease. Other conditions, such as dementia. Dementia affects your thinking and emotions. Being 55 years of age or older. Having a disability. Living in a place that is high above the ground or sea level (high in altitude). The thinner, drier air causes more fluid loss. Being active when in hot places. What are the signs or symptoms? Symptoms of dehydration depend on how bad it is. Mild or worse dehydration Thirst. Dry lips or dry mouth. Feeling dizzy or light-headed. Muscle cramps. Dark pee (urine). Pee may be the color of tea. Less pee than normal. Less tears than normal. Headache. Very bad dehydration Changes in the skin. Skin may: Be cold to the touch (clammy). Be blotchy or pale. Not go back to normal right  after you pinch it and let it go. Little or no tears, pee, or sweat. Fast breathing. Low blood pressure. Weak pulse. Pulse that is more than 100 beats a minute when you are sitting still. Other changes, such as: Feeling very thirsty. Eyes that look hollow (sunken). Cold hands and feet. Being confused. Being very tired (lethargic) or having trouble waking from sleep. Weight loss. Loss of consciousness. How is this treated? Treatment for this condition depends on how bad it is. Treatment should start right away. Do not wait until your condition gets very bad. Very bad dehydration is an emergency. You will need to go to a hospital. Mild or worse dehydration can be treated at home. You may be asked to: Drink more fluids. Drink an oral rehydration solution (ORS). This drink gives you the right amount of fluids, salts, and minerals (electrolytes). Very bad dehydration can be treated: With fluids through an IV tube. By correcting low levels of electrolytes in the body. By treating the problem that caused your dehydration. Follow these instructions at home: Oral rehydration solution If told by your doctor, drink an ORS: Make an ORS. Use instructions on the package. Start by drinking small amounts, about  cup (120 mL) every 5-10 minutes. Slowly drink more until you have had the amount your doctor said to have.  Eating and drinking  Drink enough clear fluid to keep your pee pale yellow. If you were told to drink an ORS, finish the ORS first. Then, start slowly drinking other clear fluids. Drink fluids such as: Water. Do  not drink only water. Doing that can make the salt (sodium) level in your body get too low. Water from ice chips you suck on. Fruit juice that you have added water to (diluted). Low-calorie sports drinks. Eat foods that have the right amounts of salts and minerals, such as: Bananas. Oranges. Potatoes. Tomatoes. Spinach. Do not drink alcohol. Avoid drinks that have a  lot of sugar. These include: High-calorie sports drinks. Fruit juice that you did not add water to. Soda. Caffeine. Avoid foods that are greasy or have a lot of fat or sugar. General instructions Take over-the-counter and prescription medicines only as told by your doctor. Do not take sodium tablets. Doing that can make your sodium levels high. Return to your normal activities as told by your doctor. Ask your doctor what activities are safe for you. Keep all follow-up visits. Your doctor may need to check or change your treatment. Contact a doctor if: You have pain in your belly (abdomen) and the pain: Gets worse. Stays in one place. You have a rash. You have a stiff neck. You get angry or annoyed (irritable) more easily than normal. You are more tired or have a harder time waking than normal. You feel weak or dizzy. You feel very thirsty. Get help right away if: You have symptoms of very bad dehydration. You have a fever. You have a very bad headache. You have vomiting that gets worse or does not go away. Or if: There is blood or green matter in your vomit. You cannot eat or drink without vomiting. You have diarrhea that gets worse or does not go away. You have blood in your stool (feces). This may cause stool to look black and tarry. You have dark pee or no pee. Or you pee only a little in 6-8 hours. You have trouble breathing. You have symptoms that get worse with treatment. These symptoms may be an emergency. Get help right away. Call 911. Do not wait to see if the symptoms will go away. Do not drive yourself to the hospital. This information is not intended to replace advice given to you by your health care provider. Make sure you discuss any questions you have with your health care provider. Document Revised: 04/22/2022 Document Reviewed: 04/22/2022 Elsevier Patient Education  2024 [employer].

## 2023-06-20 NOTE — Addendum Note (Signed)
Addended by: Domenic Schwab on: 06/20/2023 10:42 AM   Modules accepted: Orders

## 2023-06-23 ENCOUNTER — Encounter: Payer: Self-pay | Admitting: Oncology

## 2023-06-23 ENCOUNTER — Other Ambulatory Visit: Payer: Self-pay

## 2023-06-25 ENCOUNTER — Inpatient Hospital Stay: Payer: Medicare HMO

## 2023-06-25 ENCOUNTER — Other Ambulatory Visit: Payer: Medicare HMO

## 2023-06-25 ENCOUNTER — Encounter: Payer: Self-pay | Admitting: Hematology and Oncology

## 2023-06-25 ENCOUNTER — Inpatient Hospital Stay (INDEPENDENT_AMBULATORY_CARE_PROVIDER_SITE_OTHER): Payer: Medicare HMO | Admitting: Hematology and Oncology

## 2023-06-25 ENCOUNTER — Ambulatory Visit: Payer: Medicare HMO | Admitting: Hematology and Oncology

## 2023-06-25 VITALS — BP 111/53 | HR 88 | Temp 97.7°F | Resp 18 | Ht 62.0 in | Wt 146.7 lb

## 2023-06-25 VITALS — BP 130/62 | HR 85 | Resp 22

## 2023-06-25 DIAGNOSIS — J910 Malignant pleural effusion: Secondary | ICD-10-CM | POA: Diagnosis not present

## 2023-06-25 DIAGNOSIS — C7951 Secondary malignant neoplasm of bone: Secondary | ICD-10-CM

## 2023-06-25 DIAGNOSIS — Z79899 Other long term (current) drug therapy: Secondary | ICD-10-CM | POA: Diagnosis not present

## 2023-06-25 DIAGNOSIS — E871 Hypo-osmolality and hyponatremia: Secondary | ICD-10-CM | POA: Diagnosis not present

## 2023-06-25 DIAGNOSIS — C787 Secondary malignant neoplasm of liver and intrahepatic bile duct: Secondary | ICD-10-CM | POA: Diagnosis not present

## 2023-06-25 DIAGNOSIS — C50412 Malignant neoplasm of upper-outer quadrant of left female breast: Secondary | ICD-10-CM

## 2023-06-25 DIAGNOSIS — C7800 Secondary malignant neoplasm of unspecified lung: Secondary | ICD-10-CM | POA: Diagnosis not present

## 2023-06-25 DIAGNOSIS — D649 Anemia, unspecified: Secondary | ICD-10-CM

## 2023-06-25 DIAGNOSIS — Z170 Estrogen receptor positive status [ER+]: Secondary | ICD-10-CM | POA: Diagnosis not present

## 2023-06-25 DIAGNOSIS — Z5111 Encounter for antineoplastic chemotherapy: Secondary | ICD-10-CM | POA: Diagnosis not present

## 2023-06-25 LAB — CBC AND DIFFERENTIAL
EGFR: 60
HCT: 29 — AB (ref 36–46)
Hemoglobin: 10.4 — AB (ref 12.0–16.0)
Neutrophils Absolute: 2.94
Platelets: 233 10*3/uL (ref 150–400)
WBC: 4.2

## 2023-06-25 LAB — COMPREHENSIVE METABOLIC PANEL WITH GFR
Albumin: 3.6 (ref 3.5–5.0)
Calcium: 10.1 (ref 8.7–10.7)

## 2023-06-25 LAB — HEPATIC FUNCTION PANEL
ALT: 24 U/L (ref 7–35)
AST: 40 — AB (ref 13–35)
Alkaline Phosphatase: 107 (ref 25–125)
Bilirubin, Total: 0.6

## 2023-06-25 LAB — CBC: RBC: 3.09 — AB (ref 3.87–5.11)

## 2023-06-25 LAB — BASIC METABOLIC PANEL WITH GFR
BUN: 11 (ref 4–21)
CO2: 30 — AB (ref 13–22)
Chloride: 93 — AB (ref 99–108)
Creatinine: 0.9 (ref 0.5–1.1)
Glucose: 118
Potassium: 4.4 meq/L (ref 3.5–5.1)
Sodium: 126 — AB (ref 137–147)

## 2023-06-25 MED ORDER — SODIUM CHLORIDE 0.9 % IV SOLN: Freq: Once | INTRAVENOUS | Status: AC

## 2023-06-25 NOTE — Addendum Note (Signed)
Addended by: Domenic Schwab on: 06/25/2023 02:16 PM   Modules accepted: Orders

## 2023-06-25 NOTE — Patient Instructions (Signed)
Dehydration, Adult Dehydration is a condition in which there is not enough water or other fluids in the body. This happens when a person loses more fluids than they take in. Important organs cannot work right without the right amount of fluids. Any loss of fluids from the body can cause dehydration. Dehydration can be mild, worse, or very bad. It should be treated right away to keep it from getting very bad. What are the causes? Conditions that cause loss of water in the body. They include: Watery poop (diarrhea). Vomiting. Sweating a lot. Fever. Infection. Peeing (urinating) a lot. Not drinking enough fluids. Certain medicines, such as medicines that take extra fluid out of the body (diuretics). Lack of safe drinking water. Not being able to get enough water and food. What increases the risk? Having a long-term (chronic) illness that has not been treated the right way, such as: Diabetes. Heart disease. Kidney disease. Being 58 years of age or older. Having a disability. Living in a place that is high above the ground or sea (high in altitude). The thinner, drier air causes more fluid loss. Doing exercises that put stress on your body for a long time. Being active when in hot places. What are the signs or symptoms? Symptoms of dehydration depend on how bad it is. Mild or worse dehydration Thirst. Dry lips or dry mouth. Feeling dizzy or light-headed. Muscle cramps. Passing little pee or dark pee. Pee may be the color of tea. Headache. Very bad dehydration Changes in skin. Skin may: Be cold to the touch (clammy). Be blotchy or pale. Not go back to normal right after you pinch it and let it go. Little or no tears, pee, or sweat. Fast breathing. Low blood pressure. Weak pulse. Pulse that is more than 100 beats a minute when you are sitting still. Other changes, such as: Feeling very thirsty. Eyes that look hollow (sunken). Cold hands and feet. Being confused. Being very  tired (lethargic) or having trouble waking from sleep. Losing weight. Loss of consciousness. How is this treated? Treatment for this condition depends on how bad your dehydration is. Treatment should start right away. Do not wait until your condition gets very bad. Very bad dehydration is an emergency. You will need to go to a hospital. Mild or worse dehydration can be treated at home. You may be asked to: Drink more fluids. Drink an oral rehydration solution (ORS). This drink gives you the right amount of fluids, salts, and minerals (electrolytes). Very bad dehydration can be treated: With fluids through an IV tube. By correcting low levels of electrolytes in the body. By treating the problem that caused your dehydration. Follow these instructions at home: Oral rehydration solution If told by your doctor, drink an ORS: Make an ORS. Use instructions on the package. Start by drinking small amounts, about  cup (120 mL) every 5-10 minutes. Slowly drink more until you have had the amount that your doctor said to have.  Eating and drinking  Drink enough clear fluid to keep your pee pale yellow. If you were told to drink an ORS, finish the ORS first. Then, start slowly drinking other clear fluids. Drink fluids such as: Water. Do not drink only water. Doing that can make the salt (sodium) level in your body get too low. Water from ice chips you suck on. Fruit juice that you have added water to (diluted). Low-calorie sports drinks. Eat foods that have the right amounts of salts and minerals, such as bananas, oranges, potatoes,  tomatoes, or spinach. Do not drink alcohol. Avoid drinks that have caffeine or sugar. These include:: High-calorie sports drinks. Fruit juice that you did not add water to. Soda. Coffee or energy drinks. Avoid foods that are greasy or have a lot of fat or sugar. General instructions Take over-the-counter and prescription medicines only as told by your doctor. Do  not take sodium tablets. Doing that can make the salt level in your body get too high. Return to your normal activities as told by your doctor. Ask your doctor what activities are safe for you. Keep all follow-up visits. Your doctor may check and change your treatment. Contact a doctor if: You have pain in your belly (abdomen) and the pain: Gets worse. Stays in one place. You have a rash. You have a stiff neck. You get angry or annoyed more easily than normal. You are more tired or have a harder time waking than normal. You feel weak or dizzy. You feel very thirsty. Get help right away if: You have any symptoms of very bad dehydration. You vomit every time you eat or drink. Your vomiting gets worse, does not go away, or you vomit blood or green stuff. You are getting treatment, but symptoms are getting worse. You have a fever. You have a very bad headache. You have: Diarrhea that gets worse or does not go away. Blood in your poop (stool). This may cause poop to look black and tarry. No pee in 6-8 hours. Only a small amount of pee in 6-8 hours, and the pee is very dark. You have trouble breathing. These symptoms may be an emergency. Get help right away. Call 911. Do not wait to see if the symptoms will go away. Do not drive yourself to the hospital. This information is not intended to replace advice given to you by your health care provider. Make sure you discuss any questions you have with your health care provider. Document Revised: 04/22/2022 Document Reviewed: 04/22/2022 Elsevier Patient Education  2024 [employer].

## 2023-06-25 NOTE — Progress Notes (Signed)
[facility]  [address] [place],  [place]  [address] [phone]  Clinic Day:  06/27/2023  Referring physician: Philemon Kingdom, MD  ASSESSMENT & PLAN:   Assessment & Plan: Malignant neoplasm metastatic to liver [facility]) Liver metastasis diagnosed in January 2024 confirmed by biopsy.  She had progressive disease on imaging in June.  She has been on abemaciclib 100 mg twice daily and fulvestrant and Xgeva monthly.  She has been having difficulty tolerating this due to taste changes, decreased appetite, weight loss, hyponatremia.  Her dose of abemaciclib was reduced to 100 mg once a day 2 weeks ago without improvement in her symptoms.  I am concerned that she may have disease progression, so we will repeat CT chest, abdomen and pelvis as soon as possible and plan to see her back again next week for continued supportive care.  Hyponatremia New onset hyponatremia. She does not have evidence of dehydration or fluid overload. Serum osmolality was low. Urine osmolality was normal. She received IV normal saline last week and sodium is basically stable. She will receive additional IV saline today and Friday. I will obtain CT chest, abdomen and pelvis to reassess her disease baseline. I will plan to see her back in 1 week for continued supportive care.  Anemia New anemia most likely due to abemaciclib. This is stable after reducing the dose of abemaciclib. Will evaluate for other causes of anemia.  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  Estrogen and progesterone receptors were positive. HER2 negative.  She initially responded to palbociclib/letrozole with a decrease in the CA 27-29, as well as a decrease in the pleural effusion and no new disease on CT in July 2023.    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  Repeat imaging in June unfortunately showed progressive metastasis within the liver.  She was then placed on abemaciclib 100 mg twice daily with fulvestrant injections monthly in June.   Due to difficulty with poor appetite, taste changes and weight loss, as well as new hyponatremia, abemaciclib was decreased to 100 mg daily.   Malignant neoplasm metastatic to bone [facility]) Bone metastasis diagnosed by PET in January, not seen on CT imaging.  She continues monthly denosumab. With her other therapies.    The patient understands the plans discussed today and is in agreement with them.  She knows to contact our office if she develops concerns prior to her next appointment.   I provided 30 minutes of face-to-face time during this encounter and > 50% was spent counseling as documented under my assessment and plan.    [name], PA-C  [facility] AT [facility] [address] [place] [address] Dept: [phone] Dept Fax: [phone]   Orders Placed This Encounter  Procedures   CT CHEST ABDOMEN PELVIS W CONTRAST    Concern for disease progression    Standing Status:   Future    Standing Expiration Date:   06/24/2024    Scheduling Instructions:     RH    Order Specific Question:   If indicated for the ordered procedure, I authorize the administration of contrast media per Radiology protocol  Answer:   Yes    Order Specific Question:   Does the patient have a contrast media/X-ray dye allergy?    Answer:   No    Order Specific Question:   Preferred imaging location?    Answer:   External    Order Specific Question:   If indicated for the ordered procedure, I authorize the administration of oral contrast media per Radiology protocol     Answer:   Yes   Ferritin    Standing Status:   Future    Standing Expiration Date:   06/27/2024   Folate    Standing Status:   Future    Standing Expiration Date:   06/27/2024   Vitamin B12    Standing Status:   Future    Standing Expiration Date:   06/27/2024   Lactate dehydrogenase    Standing Status:   Future    Standing Expiration Date:   06/27/2024      CHIEF COMPLAINT:  CC: Recurrent hormone receptor positive breast cancer  Current Treatment: Verzenio 100 mg daily/Faslodex every 4 weeks/Xgeva every 4 weeks  HISTORY OF PRESENT ILLNESS:  Carrie Wilkerson is a 82 y.o. 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 She was treated with lumpectomy.  Pathology revealed a 1.5 cm, grade 1, invasive ductal carcinoma with 5 positive nodes.  Estrogen and progesterone receptors were positive and Her-2 Neu negative.  She received adjuvant chemotherapy with Adriamycin/Cytoxan for 3 months, followed by Taxol for 3 months.  She then received adjuvant radiation to the left breast.  She was placed on letrozole in April 2008 and completed 10 years of adjuvant hormonal therapy in April 2018.  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   Repeat bone density in September 2017 revealed slight improvement in the bone density, with a T-score of -2.1 in the forearm and a T-score of -1.1 in the femur with, so she has continued denosumab every 6 months, in addition to calcium/vitamin D.  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    She had reversal of her colostomy in October 2017.  She has an incisional hernia in the abdomen, which does not bother her, so she has decided against surgical repair.  She has atrial fibrillation and is on apixaban 5 mg twice  daily.  Bone density scan in September 2019 revealed improvement in the bone density with a T-score of -1.6 in forearm.  The bone density of the femur had normalized with a T-score of -0.8.  She has continued denosumab every 6 months.  Annual bilateral mammogram in December 2020 did not reveal any evidence of malignancy.  She contracted COVID-19 later in December 2020.  She did not have to be hospitalized and fully recovered.  Bone density from September 2021 revealed mildly worsened osteopenia with a T-score of -2.0 of the left forearm radius, previously -1.6.  Left femur neck is normal at -0.9, previously -0.8.  She continues calcium 1200 mg with vitamin-D daily.   She presented to the emergency department in December 2022 due to shortness of breath and left substernal area pain. ECHO revealed a normal EF between 60-65%. CT chest revealed right middle and lower lobe collapse with large right pleural effusion. There was no  central obstructing mass lesion evident, or obvious right-sided pleural disease, although there is a irregular focus of soft tissue attenuation along the medial right upper lobe pleura/right anterior mediastinum. There were scattered tiny bilateral pulmonary nodules measuring up to 4 mm. Thoracentesis was pursued yielding 1200 cc's of pleural fluid. Cytopathology confirmed malignant cells consistent with breast primary. CKAE1/3, GATA-3 and ER positive. HER2 was negative 1+. Estrogen receptor was positive at 90% and progesterone receptor was positive at 20%. Ki67 was <1%. MRI brain was negative for evidence of metastasis. PET imaging in January 2023 revealed no hypermetabolic activity to localize active metastatic breast carcinoma. Large right pleural effusion occupies 75% of the right hemithorax. Along the anteromedial margin of the right diaphragm there is hypermetabolic linear thickening of the diaphragm. She had a 2nd thoracentesis in January.  She was placed on anastrozole and palbociclib  125 mg daily for 3 weeks on and 1 week off.  She did not required further thoracentesis.  The dose of palbociclib was reduced to 100 mg daily with the 3rd cycle due to neutropenia.  The CA 27-29 had decreased from 123.6 to 40.4.  The dose of palbociclib was decreased again to 75 mg daily for 3 weeks on and 1 week off with her 5th cycle due to neutropenia.    CT imaging in July 2023 revealed a decrease in the right pleural effusion and atelectasis without new or progressive disease.  She continued anastrozole/palbociclib which was eventually switched to 3 weeks on and 2 weeks off due to cytopenias.  Bone density scan in December 2023 revealed slightly worsened osteopenia with a T-score of -2.2 in the left forearm, previously -2.0.  Left femur bone density was normal.   Unfortunately, CT imaging in January revealed a new nodule in the right middle love measuring 7 mm, with a persistent right pleural effusion, as well as 3 new hypoenhancing liver lesions consistent with metastasis, the largest measuring 18 mm.PET scan revealed widespread metastatic disease with metastatic pulmonary and pleural nodules, metastatic hepatic nodules and bone metastasis.  There was a a sizable right lower lobe/right infrahilar mass felt to be more suggestive of a recurrent lung cancer with associated mediastinal and hilar adenopathy.  Ultrasound-guided liver biopsy in February revealed metastatic carcinoma consistent with breast primary which was estrogen and progesterone receptor positive and HER2 negative.  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   CT imaging in June 2024 revealed progression of hepatic metastasis with development of innumerable bilobar lesions and enlargement of the previous lesions, the largest now measuring 2.3 x 2.5 cm.  There was enlargement of the right pleural effusion with a new tiny left pleural effusion, with stable right middle  lobe pulmonary nodule during 6 mm.  Apparently, the hypermetabolic right infrahilar mass measuring approximately 2.4 cm is not appreciated on CT.  Due to the progressive disease, Orserdu was discontinued.  She was placed on abemaciclib 100 mg twice daily with fulvestrant injections monthly and continued denosumab injections monthly.  She had been tolerating this regimen well.  CA 27-29 had increased to 422 in August, but we had not checked it since November.  The CA 27-29 was down slightly to 414 on September 6.  Her sodium was also low at 128.  She was not eating and was felt to be dehydrated, so she received 1 L of normal saline and dexamethasone 8 mg.  Her sodium was even lower on September  13 and she received IV fluids and dexamethasone again.  Urine osmolality was normal.  Oncology History  Malignant neoplasm of upper-outer quadrant of left female breast (HCC)  03/07/2006 Initial Diagnosis   Breast cancer, left breast (HCC)   03/17/2006 Cancer Staging   Staging form: Breast, AJCC 6th Edition - Clinical stage from 03/17/2006: Stage IIIA (T1c, N2a, M0) - Signed by Dellia Beckwith, MD on 12/15/2020 Staged by: Managing physician Diagnostic confirmation: Positive histology Specimen type: Excision Histopathologic type: Infiltrating duct carcinoma, NOS Tumor size (mm): 15 Laterality: Left Total positive nodes: 5 Histologic grade (G): G1 Residual tumor (R): R0 - None Stage prefix: Initial diagnosis Lymphatic vessel invasion (L): L0 - No lymphatic vessel invasion Venous invasion (V): V0 - No venous invasion Prognostic indicators: ER/PR pos, HER 2 neg., Rx with AC x 3 mo, then Taxol x 3 mo.,AI x 10 yrs   04/04/2023 -  Chemotherapy   Patient is on Treatment Plan : BREAST Fulvestrant q28d     Malignant neoplasm metastatic to bone (HCC)  11/08/2022 Initial Diagnosis   Malignant neoplasm metastatic to bone (HCC)   04/04/2023 -  Chemotherapy   Patient is on Treatment Plan : BREAST Fulvestrant q28d          INTERVAL HISTORY:  Carrie Wilkerson is here today for continued supportive care. She has fatigue relieved by rest.  Her appetite is poor despite decreasing abemaciclib to once a day. She continues to report taste changes making it difficult for her to eat.  She is drinking Ensure twice daily now. She reports occasional diarrhea and constipation. She denies difficulty breathing.  She denies fevers or chills. She denies pain. Her weight has decreased 1 pounds over last 1 week .  Our dietitian Arline Asp tried to reach her last week, but was unable to.  REVIEW OF SYSTEMS:  Review of Systems  Constitutional:  Positive for appetite change, fatigue and unexpected weight change. Negative for chills and fever.  HENT:   Negative for lump/mass, mouth sores, sore throat and trouble swallowing.   Respiratory:  Negative for cough and shortness of breath.   Cardiovascular:  Negative for chest pain and leg swelling.  Gastrointestinal:  Positive for diarrhea. Negative for abdominal pain, constipation, nausea and vomiting.  Endocrine: Negative for hot flashes.  Genitourinary:  Negative for difficulty urinating, dysuria, frequency and hematuria.   Musculoskeletal:  Negative for arthralgias, back pain and myalgias.  Skin:  Negative for rash.  Neurological:  Negative for dizziness and headaches.  Hematological:  Negative for adenopathy. Does not bruise/bleed easily.  Psychiatric/Behavioral:  Negative for depression and sleep disturbance. The patient is not nervous/anxious.      VITALS:  Blood pressure (!) 111/53, pulse 88, temperature 97.7 F (36.5 C), temperature source Oral, resp. rate 18, height 5\' 2"  (1.575 m), weight 146 lb 11.2 oz (66.5 kg), SpO2 96%.  Wt Readings from Last 3 Encounters:  06/27/23 148 lb 1.9 oz (67.2 kg)  06/25/23 146 lb 11.2 oz (66.5 kg)  06/20/23 147 lb 8 oz (66.9 kg)    Body mass index is 26.83 kg/m.  Performance status (ECOG): 2 - Symptomatic, <50% confined to bed  PHYSICAL EXAM:   Physical Exam Vitals and nursing note reviewed.  Constitutional:      General: She is not in acute distress.    Appearance: Normal appearance.  HENT:     Head: Normocephalic and atraumatic.     Mouth/Throat:     Mouth: Mucous membranes are dry.  Pharynx: Oropharynx is clear. No oropharyngeal exudate or posterior oropharyngeal erythema.  Eyes:     General: No scleral icterus.    Extraocular Movements: Extraocular movements intact.     Conjunctiva/sclera: Conjunctivae normal.     Pupils: Pupils are equal, round, and reactive to light.  Cardiovascular:     Rate and Rhythm: Normal rate and regular rhythm.     Heart sounds: Normal heart sounds. No murmur heard.    No friction rub. No gallop.  Pulmonary:     Effort: Pulmonary effort is normal.     Breath sounds: Normal breath sounds. No wheezing, rhonchi or rales.  Chest:     Comments: Breast and chest wall exam is deferred Abdominal:     General: There is no distension.     Palpations: Abdomen is soft. There is no mass.     Tenderness: There is no abdominal tenderness.  Musculoskeletal:        General: Normal range of motion.     Cervical back: Normal range of motion and neck supple. No tenderness.     Right lower leg: No edema.     Left lower leg: No edema.  Lymphadenopathy:     Cervical: No cervical adenopathy.     Upper Body:     Right upper body: No supraclavicular or axillary adenopathy.     Left upper body: No supraclavicular or axillary adenopathy.  Skin:    General: Skin is warm and dry.     Coloration: Skin is not jaundiced.     Findings: No rash.     Comments: Skin turgor is decreased  Neurological:     Mental Status: She is alert and oriented to person, place, and time.     Cranial Nerves: No cranial nerve deficit.  Psychiatric:        Mood and Affect: Mood normal.        Behavior: Behavior normal.        Thought Content: Thought content normal.     LABS:      Latest Ref Rng & Units 06/25/2023    12:00 AM 06/20/2023   12:00 AM 06/13/2023   12:00 AM  CBC  WBC  4.2     3.1     2.9      Hemoglobin 12.0 - 16.0 10.4     10.3     10.6      Hematocrit 36 - 46 29     30     31       Platelets 150 - 400 K/uL 233     182     206         This result is from an external source.      Latest Ref Rng & Units 06/25/2023   12:00 AM 06/20/2023   12:00 AM 06/13/2023   12:00 AM  CMP  BUN 4 - 21 11     8     7       Creatinine 0.5 - 1.1 0.9     0.9     1.0      Sodium 137 - 147 126     125     128      Potassium 3.5 - 5.1 mEq/L 4.4     4.2     4.2      Chloride 99 - 108 93     90     94      CO2 13 - 22 30  29     28      Calcium 8.7 - 10.7 10.1     9.6     9.6      Alkaline Phos 25 - 125 107     105     77      AST 13 - 35 40     37     34      ALT 7 - 35 U/L 24     21     20          This result is from an external source.     Lab Results  Component Value Date   CEA1 1.6 10/11/2021   /  CEA  Date Value Ref Range Status  10/11/2021 1.6 0.0 - 4.7 ng/mL Final    Comment:    (NOTE)                             Nonsmokers          <3.9                             Smokers             <5.6 Roche [employer] Electrochemiluminescence Immunoassay (ECLIA) Values obtained with different assay methods or kits cannot be used interchangeably.  Results cannot be interpreted as absolute evidence of the presence or absence of malignant disease. Performed At: [facility] [address] [place], [place] [id number] Jolene Schimke MD UY:[id number]    No results found for: "PSA1" No results found for: "[id number]" No results found for: "CAN125"  No results found for: "TOTALPROTELP", "ALBUMINELP", "A1GS", "A2GS", "BETS", "BETA2SER", "GAMS", "MSPIKE", "SPEI" No results found for: "TIBC", "FERRITIN", "IRONPCTSAT" No results found for: "LDH"  STUDIES:  No results found.    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  Report date is 11/29/21.   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   Mixed dyslipidemia 11/03/2018   Numbness in right leg    Hx: of   Obesity (BMI 30.0-34.9) 09/21/2020   Other specified disorders of bone density and structure, multiple sites 09/20/2020   Paroxysmal atrial fibrillation (HCC) 02/13/2016   Personal history of COVID-19 09/2019   Pleural effusion, 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  Pneumonia    Hx: of 'a long time ago"   Postoperative examination 02/05/2016    Past Surgical History:  Procedure Laterality Date   APPENDECTOMY     BACK SURGERY     BREAST SURGERY     Hx: of lumpectomy left breast 2007   COLONOSCOPY     Hx: of   DILATION AND CURETTAGE OF UTERUS     TONSILLECTOMY     TOTAL HIP ARTHROPLASTY Right 04/13/2013   Dr Magnus Ivan    TOTAL HIP ARTHROPLASTY Right 04/13/2013   Procedure: RIGHT TOTAL HIP ARTHROPLASTY ANTERIOR APPROACH;  Surgeon: Kathryne Hitch, MD;  [facility]: [facility] OR;  Service: Orthopedics;  Laterality: Right;   TUBAL LIGATION      Family History  Problem Relation Age of Onset   Hypertension Sister    Cancer Sister 75       uterine or ovarian   Bone cancer Sister 63   Breast cancer Sister        dx 14 and 26   Uterine cancer Sister 98   Colon cancer Sister 57   Breast cancer Sister 10   Hypertension Brother    Kidney cancer Brother 37   Cancer Brother 53       unknown type; mets   Liver cancer Brother    Liver cancer Brother 15   Pancreatic cancer Niece 45   Breast cancer Niece 87   Colon cancer Nephew 57       mets    Social History:  reports that she has never smoked. She has never used smokeless tobacco. She reports that she does not drink alcohol and does not use drugs.The patient is alone today.  Allergies:  Allergies  Allergen Reactions   Codeine     unknown    Sulfa Antibiotics     unknown   Sulfacetamide Sodium Other (See Comments)    unknown   Chlorhexidine Rash    Current Medications: Current Outpatient Medications  Medication Sig Dispense Refill   abemaciclib (VERZENIO) 100 MG tablet Take 1 tablet (100 mg total) by mouth daily. 30 tablet 0   anastrozole (ARIMIDEX) 1 MG tablet Take 1 mg by mouth daily. (Patient not taking: Reported on 05/28/2023)     apixaban (ELIQUIS) 5 MG TABS tablet TAKE 1 TABLET BY MOUTH TWICE A DAY 180 tablet 1   atorvastatin (LIPITOR) 10 MG tablet Take 1 tablet by mouth every other day.  4   benzonatate (TESSALON) 100 MG capsule Take 1 capsule (100 mg total) by mouth 3 (three) times daily as needed for cough. (Patient not taking: Reported on 06/18/2023) 20 capsule 5   calcium carbonate (OSCAL) 1500 (600 Ca) MG TABS tablet Take 600 mg of elemental calcium by mouth daily with breakfast.     cholecalciferol (VITAMIN D) 1000 UNITS tablet Take  1,000 Units by mouth daily.     denosumab (XGEVA) 120 MG/1.7ML SOLN injection Inject 120 mg into the skin as directed. Once every 4 weeks     Flaxseed, Linseed, OIL Take 1 capsule by mouth daily.     Fulvestrant (FASLODEX IM) Inject into the muscle. 500 mg injection every 4 weeks     glucosamine-chondroitin 500-400 MG tablet Take 1 tablet by mouth 2 (two) times daily.      levothyroxine (SYNTHROID, LEVOTHROID) 75 MCG tablet Take 75 mcg by mouth daily.   7   metoprolol tartrate (LOPRESSOR) 25 MG tablet TAKE 1 TABLET (25 MG TOTAL) BY MOUTH 2 (TWO) TIMES DAILY. PATIENT  NEEDS APPOINTMENT FOR FURTHER REFILLS. 1 ST ATTEMPT 60 tablet 0   Multiple Vitamin (MULTIVITAMIN WITH MINERALS) TABS Take 1 tablet by mouth daily.     ondansetron (ZOFRAN) 4 MG tablet Take 1 tablet (4 mg total) by mouth every 4 (four) hours as needed for nausea or vomiting. 30 tablet 5   prochlorperazine (COMPAZINE) 10 MG tablet Take 1 tablet (10 mg total) by mouth every 6 (six) hours as needed for nausea or vomiting. 30 tablet 5   No current facility-administered medications for this visit.

## 2023-06-25 NOTE — Addendum Note (Signed)
Addended by: Domenic Schwab on: 06/25/2023 02:14 PM   Modules accepted: Orders

## 2023-06-25 NOTE — Assessment & Plan Note (Signed)
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  She had progressive disease on imaging in June.  She has been on abemaciclib 100 mg twice daily and fulvestrant and Xgeva monthly.  She has been having difficulty tolerating this due to taste changes, decreased appetite, weight loss, hyponatremia.  Her dose of abemaciclib was reduced to 100 mg once a day 2 weeks ago without improvement in her symptoms.  I am concerned that she may have disease progression, so we will repeat CT chest, abdomen and pelvis as soon as possible and plan to see her back again next week for continued supportive care.

## 2023-06-26 ENCOUNTER — Telehealth: Payer: Self-pay | Admitting: Dietician

## 2023-06-26 ENCOUNTER — Telehealth: Payer: Self-pay | Admitting: Hematology and Oncology

## 2023-06-26 ENCOUNTER — Encounter: Payer: Self-pay | Admitting: Oncology

## 2023-06-26 NOTE — Telephone Encounter (Signed)
Patient screened on MST. Second attempt to reach. There was no answer at home# or daughter's #. Tried sending daughter a text but it is a land line as well.  Will ask scheduling  to set up a nutrition consult.  Gennaro Africa, RDN, LDN Registered Dietitian, [facility] Part Time Remote (Usual office hours: Tuesday-Thursday) Cell: [phone]

## 2023-06-26 NOTE — Telephone Encounter (Signed)
Patient has been scheduled. Aware of appt date and time.    Scheduling Message Entered by Belva Crome A on 06/25/2023 at  7:43 PM Priority: High <No visit type provided>  Department: [facility]  Provider:  Scheduling Notes:  I just realized I'm not here on 9/26. Will need to be 9/25. Thanks  ----- Message -----  From: [name], PA-C  Sent: 06/25/2023   4:27 PM EDT  To: Scheduling Message Pool    1. CT chest, abdomen and pelvis as soon as possible, concerning for progressive disease.  Please mark this stat so it is read right away.  2.  Labs and follow-up with possible IV fluids on 8/26

## 2023-06-27 ENCOUNTER — Inpatient Hospital Stay: Payer: Medicare HMO

## 2023-06-27 ENCOUNTER — Telehealth: Payer: Self-pay

## 2023-06-27 VITALS — BP 123/71 | HR 104 | Temp 97.3°F | Resp 12 | Ht 62.0 in | Wt 148.1 lb

## 2023-06-27 DIAGNOSIS — Z170 Estrogen receptor positive status [ER+]: Secondary | ICD-10-CM | POA: Diagnosis not present

## 2023-06-27 DIAGNOSIS — C7951 Secondary malignant neoplasm of bone: Secondary | ICD-10-CM | POA: Diagnosis not present

## 2023-06-27 DIAGNOSIS — J90 Pleural effusion, not elsewhere classified: Secondary | ICD-10-CM | POA: Diagnosis not present

## 2023-06-27 DIAGNOSIS — K8020 Calculus of gallbladder without cholecystitis without obstruction: Secondary | ICD-10-CM | POA: Diagnosis not present

## 2023-06-27 DIAGNOSIS — D259 Leiomyoma of uterus, unspecified: Secondary | ICD-10-CM | POA: Diagnosis not present

## 2023-06-27 DIAGNOSIS — C7800 Secondary malignant neoplasm of unspecified lung: Secondary | ICD-10-CM | POA: Diagnosis not present

## 2023-06-27 DIAGNOSIS — Z79899 Other long term (current) drug therapy: Secondary | ICD-10-CM | POA: Diagnosis not present

## 2023-06-27 DIAGNOSIS — C50412 Malignant neoplasm of upper-outer quadrant of left female breast: Secondary | ICD-10-CM | POA: Diagnosis not present

## 2023-06-27 DIAGNOSIS — Z5111 Encounter for antineoplastic chemotherapy: Secondary | ICD-10-CM | POA: Diagnosis not present

## 2023-06-27 DIAGNOSIS — C787 Secondary malignant neoplasm of liver and intrahepatic bile duct: Secondary | ICD-10-CM | POA: Diagnosis not present

## 2023-06-27 DIAGNOSIS — C801 Malignant (primary) neoplasm, unspecified: Secondary | ICD-10-CM | POA: Diagnosis not present

## 2023-06-27 MED ORDER — FULVESTRANT 250 MG/5ML IM SOSY
250.0000 mg | PREFILLED_SYRINGE | Freq: Once | INTRAMUSCULAR | Status: AC
  Administered 2023-06-27: 250 mg via INTRAMUSCULAR
  Filled 2023-06-27: qty 5

## 2023-06-27 MED ORDER — DENOSUMAB 120 MG/1.7ML ~~LOC~~ SOLN
120.0000 mg | Freq: Once | SUBCUTANEOUS | Status: AC
  Administered 2023-06-27: 120 mg via SUBCUTANEOUS
  Filled 2023-06-27: qty 1.7

## 2023-06-27 MED ORDER — SODIUM CHLORIDE 0.9 % IV SOLN: Freq: Once | INTRAVENOUS | Status: AC

## 2023-06-27 NOTE — Telephone Encounter (Signed)
Order faxed to CT to leave port accessed after CT.

## 2023-06-27 NOTE — Patient Instructions (Signed)
Fulvestrant Injection What is this medication? FULVESTRANT (ful VES trant) treats breast cancer. It works by blocking the hormone estrogen in breast tissue, which prevents breast cancer cells from spreading or growing. This medicine may be used for other purposes; ask your health care provider or pharmacist if you have questions. COMMON BRAND NAME(S): FASLODEX What should I tell my care team before I take this medication? They need to know if you have any of these conditions: Bleeding disorder Liver disease Low blood cell levels, such as low white cells, red cells, and platelets An unusual or allergic reaction to fulvestrant, other medications, foods, dyes, or preservatives Pregnant or trying to get pregnant Breast-feeding How should I use this medication? This medication is injected into a muscle. It is given by your care team in a hospital or clinic setting. Talk to your care team about the use of this medication in children. Special care may be needed. Overdosage: If you think you have taken too much of this medicine contact a poison control center or emergency room at once. NOTE: This medicine is only for you. Do not share this medicine with others. What if I miss a dose? Keep appointments for follow-up doses. It is important not to miss your dose. Call your care team if you are unable to keep an appointment. What may interact with this medication? Certain medications that prevent or treat blood clots, such as warfarin, enoxaparin, dalteparin, apixaban, dabigatran, rivaroxaban This list may not describe all possible interactions. Give your health care provider a list of all the medicines, herbs, non-prescription drugs, or dietary supplements you use. Also tell them if you smoke, drink alcohol, or use illegal drugs. Some items may interact with your medicine. What should I watch for while using this medication? Your condition will be monitored carefully while you are receiving this  medication. You may need blood work while taking this medication. Talk to your care team if you may be pregnant. Serious birth defects can occur if you take this medication during pregnancy and for 1 year after the last dose. You will need a negative pregnancy test before starting this medication. Contraception is recommended while taking this medication and for 1 year after the last dose. Your care team can help you find the option that works for you. Do not breastfeed while taking this medication and for 1 year after the last dose. This medication may cause infertility. Talk to your care team if you are concerned about your fertility. What side effects may I notice from receiving this medication? Side effects that you should report to your care team as soon as possible: Allergic reactions or angioedema--skin rash, itching or hives, swelling of the face, eyes, lips, tongue, arms, or legs, trouble swallowing or breathing Pain, tingling, or numbness in the hands or feet Side effects that usually do not require medical attention (report to your care team if they continue or are bothersome): Bone, joint, or muscle pain Constipation Headache Hot flashes Nausea Pain, redness, or irritation at injection site Unusual weakness or fatigue This list may not describe all possible side effects. Call your doctor for medical advice about side effects. You may report side effects to FDA at 1-800-FDA-1088. Where should I keep my medication? This medication is given in a hospital or clinic. It will not be stored at home. NOTE: This sheet is a summary. It may not cover all possible information. If you have questions about this medicine, talk to your doctor, pharmacist, or health care provider.  2024 Elsevier/Gold Standard (2022-02-05 00:00:00) Hyponatremia Hyponatremia is when the amount of salt (sodium) in a person's blood is too low. When sodium levels are low, the cells absorb extra water, which causes them  to swell. The swelling happens throughout the body, but it mostly affects the brain. What are the causes? This condition may be caused by: Certain medical conditions, such as: Heart, kidney, or liver problems. Thyroid problems. Adrenal gland problems. Metabolic conditions, such as Addison's disease or syndrome of inappropriate antidiuresis (SIAD). Excessive vomiting, diarrhea, or sweating. Certain medicines or illegal drugs. Fluids given through an IV. What increases the risk? You are more likely to develop this condition if you: Have certain medical conditions such as heart, kidney, or liver failure. Have a medical condition that causes frequent or excessive diarrhea. Participate in intense physical activities, such as marathon running. Take certain medicines that affect the sodium and fluid balance in the blood. Some of these medicine types include: Diuretics. NSAIDs, such as ibuprofen. Some opioid pain medicines. Some antidepressants. Some seizure prevention medicines. What are the signs or symptoms? Symptoms of this condition include: Headache. Nausea and vomiting. Being very tired (lethargic). Muscle weakness and cramping. Loss of appetite. Feeling weak or light-headed. Severe symptoms of this condition include: Confusion. Agitation. Having a rapid heart rate. Fainting. Seizures. Coma. How is this diagnosed? This condition is diagnosed based on: A physical exam. Your medical history. Tests, including: Blood tests. Urine tests. How is this treated? Treatment for this condition depends on the cause. Treatment may include: Getting fluids through an IV that is inserted into one of your veins. Medicines to correct the sodium imbalance. If medicines are causing the condition, the medicines will need to be adjusted. Limiting your water or fluid intake to get the correct sodium balance, in certain cases. Monitoring in the hospital to closely watch your symptoms for  improvement. Follow these instructions at home:  Take over-the-counter and prescription medicines only as told by your health care provider. Many medicines can make this condition worse. Talk with your health care provider about any medicines that you are currently taking. Do not drink alcohol. Keep all follow-up visits. This is important. Contact a health care provider if: You develop worsening nausea, fatigue, headache, confusion, or weakness. Your symptoms go away and then return. Get help right away if: You have a seizure. You faint. You have ongoing diarrhea or vomiting. Summary Hyponatremia is when the amount of salt (sodium) in your blood is too low. When sodium levels are low, your cells absorb extra water, which causes them to swell. The swelling happens throughout the body, but it mostly affects the brain. Treatment for this condition depends on the cause. It may include receiving IV fluids, taking or adjusting medicines, limiting fluid intake, and monitoring in the hospital. This information is not intended to replace advice given to you by your health care provider. Make sure you discuss any questions you have with your health care provider. Document Revised: 04/03/2021 Document Reviewed: 04/03/2021 Elsevier Patient Education  2024 Elsevier Inc. Denosumab Injection (Oncology) What is this medication? DENOSUMAB (den oh SUE mab) prevents weakened bones caused by cancer. It may also be used to treat noncancerous bone tumors that cannot be removed by surgery. It can also be used to treat high calcium levels in the blood caused by cancer. It works by blocking a protein that causes bones to break down quickly. This slows down the release of calcium from bones, which lowers calcium levels in your blood.  It also makes your bones stronger and less likely to break (fracture). This medicine may be used for other purposes; ask your health care provider or pharmacist if you have  questions. COMMON BRAND NAME(S): XGEVA What should I tell my care team before I take this medication? They need to know if you have any of these conditions: Dental disease Having surgery or tooth extraction Infection Kidney disease Low levels of calcium or vitamin D in the blood Malnutrition On hemodialysis Skin conditions or sensitivity Thyroid or parathyroid disease An unusual reaction to denosumab, other medications, foods, dyes, or preservatives Pregnant or trying to get pregnant Breast-feeding How should I use this medication? This medication is for injection under the skin. It is given by your care team in a hospital or clinic setting. A special MedGuide will be given to you before each treatment. Be sure to read this information carefully each time. Talk to your care team about the use of this medication in children. While it may be prescribed for children as young as 13 years for selected conditions, precautions do apply. Overdosage: If you think you have taken too much of this medicine contact a poison control center or emergency room at once. NOTE: This medicine is only for you. Do not share this medicine with others. What if I miss a dose? Keep appointments for follow-up doses. It is important not to miss your dose. Call your care team if you are unable to keep an appointment. What may interact with this medication? Do not take this medication with any of the following: Other medications containing denosumab This medication may also interact with the following: Medications that lower your chance of fighting infection Steroid medications, such as prednisone or cortisone This list may not describe all possible interactions. Give your health care provider a list of all the medicines, herbs, non-prescription drugs, or dietary supplements you use. Also tell them if you smoke, drink alcohol, or use illegal drugs. Some items may interact with your medicine. What should I watch for  while using this medication? Your condition will be monitored carefully while you are receiving this medication. You may need blood work while taking this medication. This medication may increase your risk of getting an infection. Call your care team for advice if you get a fever, chills, sore throat, or other symptoms of a cold or flu. Do not treat yourself. Try to avoid being around people who are sick. You should make sure you get enough calcium and vitamin D while you are taking this medication, unless your care team tells you not to. Discuss the foods you eat and the vitamins you take with your care team. Some people who take this medication have severe bone, joint, or muscle pain. This medication may also increase your risk for jaw problems or a broken thigh bone. Tell your care team right away if you have severe pain in your jaw, bones, joints, or muscles. Tell your care team if you have any pain that does not go away or that gets worse. Talk to your care team if you may be pregnant. Serious birth defects can occur if you take this medication during pregnancy and for 5 months after the last dose. You will need a negative pregnancy test before starting this medication. Contraception is recommended while taking this medication and for 5 months after the last dose. Your care team can help you find the option that works for you. What side effects may I notice from receiving this medication?  Side effects that you should report to your care team as soon as possible: Allergic reactions--skin rash, itching, hives, swelling of the face, lips, tongue, or throat Bone, joint, or muscle pain Low calcium level--muscle pain or cramps, confusion, tingling, or numbness in the hands or feet Osteonecrosis of the jaw--pain, swelling, or redness in the mouth, numbness of the jaw, poor healing after dental work, unusual discharge from the mouth, visible bones in the mouth Side effects that usually do not require  medical attention (report to your care team if they continue or are bothersome): Cough Diarrhea Fatigue Headache Nausea This list may not describe all possible side effects. Call your doctor for medical advice about side effects. You may report side effects to FDA at 1-800-FDA-1088. Where should I keep my medication? This medication is given in a hospital or clinic. It will not be stored at home. NOTE: This sheet is a summary. It may not cover all possible information. If you have questions about this medicine, talk to your doctor, pharmacist, or health care provider.  2024 Elsevier/Gold Standard (2022-02-13 00:00:00)

## 2023-06-28 ENCOUNTER — Encounter: Payer: Self-pay | Admitting: Oncology

## 2023-06-28 ENCOUNTER — Encounter: Payer: Self-pay | Admitting: Hematology and Oncology

## 2023-06-28 ENCOUNTER — Other Ambulatory Visit: Payer: Self-pay

## 2023-06-28 DIAGNOSIS — D649 Anemia, unspecified: Secondary | ICD-10-CM | POA: Insufficient documentation

## 2023-06-28 HISTORY — DX: Anemia, unspecified: D64.9

## 2023-06-28 NOTE — Assessment & Plan Note (Signed)
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  She continues monthly denosumab. With her other therapies.

## 2023-06-28 NOTE — Assessment & Plan Note (Signed)
New onset hyponatremia. She does not have evidence of dehydration or fluid overload. Serum osmolality was low. Urine osmolality was normal. She received IV normal saline last week and sodium is basically stable. She will receive additional IV saline today and Friday. I will obtain CT chest, abdomen and pelvis to reassess her disease baseline. I will plan to see her back in 1 week for continued supportive care.

## 2023-06-28 NOTE — Assessment & Plan Note (Signed)
New anemia most likely due to abemaciclib. This is stable after reducing the dose of abemaciclib. Will evaluate for other causes of anemia.

## 2023-06-28 NOTE — Assessment & Plan Note (Addendum)
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  Estrogen and progesterone receptors were positive. HER2 negative.  She initially responded to palbociclib/letrozole with a decrease in the CA 27-29, as well as a decrease in the pleural effusion and no new disease on CT in July 2023.    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  Repeat imaging in June unfortunately showed progressive metastasis within the liver.  She was then placed on abemaciclib 100 mg twice daily with fulvestrant injections monthly in June.   Due to difficulty with poor appetite, taste changes and weight loss, as well as new hyponatremia, abemaciclib was decreased to 100 mg daily.

## 2023-07-02 ENCOUNTER — Encounter: Payer: Self-pay | Admitting: Hematology and Oncology

## 2023-07-02 ENCOUNTER — Inpatient Hospital Stay (INDEPENDENT_AMBULATORY_CARE_PROVIDER_SITE_OTHER): Payer: Medicare HMO | Admitting: Hematology and Oncology

## 2023-07-02 ENCOUNTER — Telehealth: Payer: Self-pay | Admitting: Hematology and Oncology

## 2023-07-02 ENCOUNTER — Inpatient Hospital Stay: Payer: Medicare HMO

## 2023-07-02 VITALS — BP 133/61 | HR 81 | Temp 98.2°F | Resp 18 | Ht 62.0 in | Wt 145.8 lb

## 2023-07-02 DIAGNOSIS — Z170 Estrogen receptor positive status [ER+]: Secondary | ICD-10-CM

## 2023-07-02 DIAGNOSIS — Z5111 Encounter for antineoplastic chemotherapy: Secondary | ICD-10-CM | POA: Diagnosis not present

## 2023-07-02 DIAGNOSIS — E871 Hypo-osmolality and hyponatremia: Secondary | ICD-10-CM

## 2023-07-02 DIAGNOSIS — C7951 Secondary malignant neoplasm of bone: Secondary | ICD-10-CM | POA: Diagnosis not present

## 2023-07-02 DIAGNOSIS — C787 Secondary malignant neoplasm of liver and intrahepatic bile duct: Secondary | ICD-10-CM

## 2023-07-02 DIAGNOSIS — Z79899 Other long term (current) drug therapy: Secondary | ICD-10-CM | POA: Diagnosis not present

## 2023-07-02 DIAGNOSIS — C7800 Secondary malignant neoplasm of unspecified lung: Secondary | ICD-10-CM | POA: Diagnosis not present

## 2023-07-02 DIAGNOSIS — C50412 Malignant neoplasm of upper-outer quadrant of left female breast: Secondary | ICD-10-CM | POA: Diagnosis not present

## 2023-07-02 DIAGNOSIS — D649 Anemia, unspecified: Secondary | ICD-10-CM | POA: Diagnosis not present

## 2023-07-02 LAB — COMPREHENSIVE METABOLIC PANEL
Albumin: 3.6 (ref 3.5–5.0)
Calcium: 9.4 (ref 8.7–10.7)

## 2023-07-02 LAB — CBC AND DIFFERENTIAL
EGFR: 60
HCT: 28 — AB (ref 36–46)
Hemoglobin: 9.8 — AB (ref 12.0–16.0)
MCV: 97 (ref 81–99)
Neutrophils Absolute: 2.96
Platelets: 299 10*3/uL (ref 150–400)
WBC: 3.9

## 2023-07-02 LAB — VITAMIN B12: Vitamin B-12: 636 pg/mL (ref 180–914)

## 2023-07-02 LAB — HEPATIC FUNCTION PANEL
ALT: 20 U/L (ref 7–35)
AST: 41 — AB (ref 13–35)
Alkaline Phosphatase: 135 — AB (ref 25–125)
Bilirubin, Total: 0.9

## 2023-07-02 LAB — BASIC METABOLIC PANEL
BUN: 9 (ref 4–21)
CO2: 29 — AB (ref 13–22)
Chloride: 90 — AB (ref 99–108)
Creatinine: 0.7 (ref 0.5–1.1)
Glucose: 108
Potassium: 4.2 mEq/L (ref 3.5–5.1)
Sodium: 124 — AB (ref 137–147)

## 2023-07-02 LAB — FERRITIN: Ferritin: 387 ng/mL — ABNORMAL HIGH (ref 11–307)

## 2023-07-02 LAB — CBC: RBC: 2.91 — AB (ref 3.87–5.11)

## 2023-07-02 LAB — FOLATE: Folate: 22.7 ng/mL (ref >5.9)

## 2023-07-02 LAB — CORTISOL: Cortisol, Plasma: 21.3 ug/dL

## 2023-07-02 LAB — OSMOLALITY, URINE: Osmolality, Ur: 493 mOsm/kg (ref 300–900)

## 2023-07-02 LAB — SODIUM, URINE, RANDOM: Sodium, Ur: 78 mmol/L

## 2023-07-02 NOTE — Assessment & Plan Note (Addendum)
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  She had progressive disease on imaging in June while on letrozole and palbociclib.  She was then on abemaciclib 100 mg twice daily and fulvestrant monthly.  She has been having difficulty tolerating this due to taste changes, decreased appetite, weight loss, hyponatremia.  Her dose of abemaciclib was reduced to 100 mg once a day 3 weeks ago.  She received fulvestrant last week.  Repeat CT imaging revealed some progression of dominant liver metastasis, was a new 3 mm right lower lobe nodule, exam was otherwise stable.  She continues to report decreased appetite and changes.  She has lost some more weight.  She has mild epigastric tenderness, which may be due to gastritis.  I will hold off on any medication for this due to the hyponatremia.  I will have her hold abemaciclib at this time.  I will have her see Dr. Gilman Buttner again next week with a CBC and comprehensive metabolic panel for further treatment recommendation.

## 2023-07-02 NOTE — Telephone Encounter (Signed)
Patient has been scheduled. Aware of appt date and time.    Scheduling Message Entered by Belva Crome A on 07/02/2023 at 11:55 AM Priority: High <No visit type provided>  Department: [facility]  Provider:  Scheduling Notes:  Patient needs labs and appt with Dr. Shyrl Numbers with possible fluids afterward next week, so will need to switch patients to my schedule or r/s. On 10/2 both 1:30 and 3:30 are routine f/u that could be changed. Thanks

## 2023-07-02 NOTE — Telephone Encounter (Signed)
07/02/2023  Per Harvin Hazel, patient will not need IVF today

## 2023-07-02 NOTE — Assessment & Plan Note (Addendum)
New onset hyponatremia. She does not have evidence of dehydration or fluid overload. Serum osmolality was low. Urine osmolality was normal.  She is not on for thiazide diuretics or other medications known to cause hyponatremia.  She received IV normal saline last week and sodium is lower. CT chest, abdomen and pelvis reveals slight progression of her dominant hepatic metastasis with a new 3 mm right lower lobe pulmonary nodule. Will hold off on additional IV fluids and evaluate for other causes of hyponatremia.

## 2023-07-02 NOTE — Progress Notes (Signed)
CRITICAL VALUE STICKER  CRITICAL VALUE:  Na+ [phone]  RECEIVER (on-site recipient of call):  Dyane Dustman RN  DATE & TIME NOTIFIED:   09/025/2024  @ 1223  MESSENGER (representative from lab):  [facility] lab  MD NOTIFIED:   Belva Crome PA  TIME OF NOTIFICATION:  1227  RESPONSE:  Scheduled to see patient.

## 2023-07-02 NOTE — Assessment & Plan Note (Signed)
New anemia most likely due to abemaciclib.  Worsening, despite reducing the dose of abemaciclib.  Due to other toxicities, we are discontinuing abemaciclib.  Will evaluate for other causes of anemia.

## 2023-07-02 NOTE — Progress Notes (Signed)
[facility]  [address] 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  She had progressive disease on imaging in June while on letrozole and palbociclib.  She was then on abemaciclib 100 mg twice daily and fulvestrant monthly.  She has been having difficulty tolerating this due to taste changes, decreased appetite, weight loss, hyponatremia.  Her dose of abemaciclib was reduced to 100 mg once a day 3 weeks ago.  She received fulvestrant last week.  Repeat CT imaging revealed some progression of dominant liver metastasis, was a new 3 mm right lower lobe nodule, exam was otherwise stable.  She continues to report decreased appetite and changes.  She has lost some more weight.  She has mild epigastric tenderness, which may be due to gastritis.  I will hold off on any medication for this due to the hyponatremia.  I will have her hold abemaciclib at this time.  I will have her see Dr. Gilman Buttner again next week with a CBC and comprehensive metabolic panel for further treatment recommendation.  Malignant neoplasm metastatic to bone [facility]) Bone metastasis diagnosed by PET in January, not seen on CT imaging. Her calcium was 10.5 last week and she received her monthly denosumab last week.  Calcium is in normal range today.  Hyponatremia New onset hyponatremia. She does not have evidence of dehydration or fluid overload. Serum osmolality was low. Urine osmolality was normal.  She is not on for thiazide diuretics or other medications known to cause hyponatremia.  She received IV normal saline last week and sodium is lower. CT chest, abdomen and pelvis reveals slight progression of her dominant hepatic metastasis with a new 3 mm  right lower lobe pulmonary nodule. Will hold off on additional IV fluids and evaluate for other causes of hyponatremia.  Anemia New anemia most likely due to abemaciclib.  Worsening, despite reducing the dose of abemaciclib.  Due to other toxicities, we are discontinuing abemaciclib.  Will evaluate for other causes of anemia.     The patient and her daughter understand the plans discussed today and are in agreement with them.  They know to contact our office if she develops concerns prior to her next appointment.   I provided 30 minutes of face-to-face time during this encounter and > 50% was spent counseling as documented under my assessment and plan.    [name], PA-C  [facility] AT [facility] [address] [place] [address] Dept: [phone] Dept Fax: [phone]   Orders Placed This Encounter  Procedures   Urine, Osmolality    Standing Status:   Future    Number of Occurrences:   1    Standing Expiration Date:   07/01/2024   Urine, Sodium    Standing Status:   Future    Number of Occurrences:   1    Standing Expiration Date:   07/01/2024   Cortisol    Standing Status:   Future    Number of Occurrences:   1    Standing Expiration Date:   12/30/2023   CBC and differential    This external order was created through the Results Console.   CBC    This external order was created through  the Results Console.   Basic metabolic panel    This external order was created through the Results Console.   Comprehensive metabolic panel    This external order was created through the Results Console.   Hepatic function panel    This external order was created through the Results Console.      CHIEF COMPLAINT:  CC: Recurrent hormone receptor positive breast cancer  Current Treatment: Verzenio 100 mg daily/Faslodex every 4 weeks/Xgeva every 4 weeks  HISTORY OF PRESENT ILLNESS:  Carrie Wilkerson is a 82 y.o.  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 She was treated with lumpectomy.  Pathology revealed a 1.5 cm, grade 1, invasive ductal carcinoma with 5 positive nodes.  Estrogen and progesterone receptors were positive and Her-2 Neu negative.  She received adjuvant chemotherapy with Adriamycin/Cytoxan for 3 months, followed by Taxol for 3 months.  She then received adjuvant radiation to the left breast.  She was placed on letrozole in April 2008 and completed 10 years of adjuvant hormonal therapy in April 2018.  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   Repeat bone density in September 2017 revealed slight improvement in the bone density, with a T-score of -2.1 in the forearm and a T-score of -1.1 in the femur with, so she has continued denosumab every 6 months, in addition to calcium/vitamin D.  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    She had reversal of her colostomy in October 2017.  She has an incisional hernia in the abdomen, which does not bother her, so she has decided against surgical repair.  She has atrial fibrillation and is on apixaban 5 mg twice daily.  Bone density scan in September 2019 revealed improvement in the bone density with a T-score of -1.6 in forearm.  The bone density of the femur had normalized with a T-score of -0.8.  She has continued denosumab every 6 months.  Annual bilateral mammogram in December 2020 did not reveal any evidence of malignancy.  She contracted COVID-19 later in December 2020.  She did not have to be hospitalized and fully recovered.  Bone density from September 2021 revealed mildly worsened osteopenia with a T-score of -2.0 of the left forearm radius, previously -1.6.  Left femur neck is normal at -0.9,  previously -0.8.  She continues calcium 1200 mg with vitamin-D daily.   She presented to the emergency department in December 2022 due to shortness of breath and left substernal area pain. ECHO revealed a normal EF between 60-65%. CT chest revealed right middle and lower lobe collapse with large right pleural effusion. There was no central obstructing mass lesion evident, or obvious right-sided pleural disease, although there is a irregular focus of soft tissue attenuation along the medial right upper lobe pleura/right anterior mediastinum. There were scattered tiny bilateral pulmonary nodules measuring up to 4 mm. Thoracentesis was pursued yielding 1200 cc's of pleural fluid. Cytopathology confirmed malignant cells consistent with breast primary. CKAE1/3, GATA-3 and ER positive. HER2 was negative 1+. Estrogen receptor was positive at 90% and progesterone receptor was positive at 20%. Ki67 was <1%. MRI brain was negative for evidence of metastasis. PET imaging in January 2023 revealed no hypermetabolic activity to localize active metastatic breast carcinoma.  Large right pleural effusion occupies 75% of the right hemithorax. Along the anteromedial margin of the right diaphragm there is hypermetabolic linear thickening of the diaphragm. She had a 2nd thoracentesis in January.  She was placed on anastrozole and palbociclib 125 mg daily for 3 weeks on and 1 week off.  She did not required further thoracentesis.  The dose of palbociclib was reduced to 100 mg daily with the 3rd cycle due to neutropenia.  The CA 27-29 had decreased from 123.6 to 40.4.  The dose of palbociclib was decreased again to 75 mg daily for 3 weeks on and 1 week off with her 5th cycle due to neutropenia.    CT imaging in July 2023 revealed a decrease in the right pleural effusion and atelectasis without new or progressive disease.  She continued anastrozole/palbociclib which was eventually switched to 3 weeks on and 2 weeks off due to  cytopenias.  Bone density scan in December 2023 revealed slightly worsened osteopenia with a T-score of -2.2 in the left forearm, previously -2.0.  Left femur bone density was normal.   Unfortunately, CT imaging in January revealed a new nodule in the right middle love measuring 7 mm, with a persistent right pleural effusion, as well as 3 new hypoenhancing liver lesions consistent with metastasis, the largest measuring 18 mm.PET scan revealed widespread metastatic disease with metastatic pulmonary and pleural nodules, metastatic hepatic nodules and bone metastasis.  There was a a sizable right lower lobe/right infrahilar mass felt to be more suggestive of a recurrent lung cancer with associated mediastinal and hilar adenopathy.  Ultrasound-guided liver biopsy in February revealed metastatic carcinoma consistent with breast primary which was estrogen and progesterone receptor positive and HER2 negative.  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   CT imaging in June 2024 revealed progression of hepatic metastasis with development of innumerable bilobar lesions and enlargement of the previous lesions, the largest now measuring 2.3 x 2.5 cm.  There was enlargement of the right pleural effusion with a new tiny left pleural effusion, with stable right middle lobe pulmonary nodule during 6 mm.  Apparently, the hypermetabolic right infrahilar mass measuring approximately 2.4 cm is not appreciated on CT.  Due to the progressive disease, Orserdu was discontinued.  She was placed on abemaciclib 100 mg twice daily with fulvestrant injections monthly and continued denosumab injections monthly.  She had been tolerating this regimen well.  CA 27-29 had increased to 422 in August, but we had not checked it since November.  The CA 27-29 was down slightly to 414 on September 6.  Her sodium was also low at 128.  She was not eating and was felt to be  dehydrated, so she received 1 L of normal saline and dexamethasone 8 mg.  Her sodium was even lower on September 13 and she received IV fluids and dexamethasone again.  Urine osmolality was normal.  As she continued to do poorly despite reducing the dose of abemaciclib, I repeated CT chest, abdomen and pelvis.  Oncology History  Malignant neoplasm of upper-outer quadrant of left female breast (HCC)  03/07/2006 Initial Diagnosis   Breast cancer, left breast (HCC)   03/17/2006 Cancer Staging   Staging form: Breast, AJCC 6th Edition - Clinical stage from 03/17/2006: Stage IIIA (T1c, N2a, M0) - Signed by Dellia Beckwith, MD on 12/15/2020 Staged by: Managing physician Diagnostic confirmation: Positive histology Specimen type: Excision Histopathologic type: Infiltrating duct  carcinoma, NOS Tumor size (mm): 15 Laterality: Left Total positive nodes: 5 Histologic grade (G): G1 Residual tumor (R): R0 - None Stage prefix: Initial diagnosis Lymphatic vessel invasion (L): L0 - No lymphatic vessel invasion Venous invasion (V): V0 - No venous invasion Prognostic indicators: ER/PR pos, HER 2 neg., Rx with AC x 3 mo, then Taxol x 3 mo.,AI x 10 yrs   04/04/2023 -  Chemotherapy   Patient is on Treatment Plan : BREAST Fulvestrant q28d     Malignant neoplasm metastatic to bone (HCC)  11/08/2022 Initial Diagnosis   Malignant neoplasm metastatic to bone (HCC)   04/04/2023 -  Chemotherapy   Patient is on Treatment Plan : BREAST Fulvestrant q28d         INTERVAL HISTORY:  Taylani is here today for continued supportive care.  Her appetite remains decreased.  She is still not eating much.  She is drinking 3 bottles of water per day, most likely 16.9 ounces.  She continues to report taste changes.  She is drinking 2 nutritional supplements daily.  She reports occasional shortness of breath with exertion.  She denies chest pain or palpitations.  She reports occasional nausea for which ondansetron is effective.   She denies fevers or chills. She reports epigastric tenderness pain. Her weight has decreased 3 pounds over last 1 week .  CT chest, abdomen and pelvis on September 20 revealed a slight increase in the dominant hepatic metastasis in the right lobe.  The other small hepatic metastasis were stable.  There was a slight increase in a right lower lobe pulmonary nodule now measuring 3 mm with a stable right middle lobe nodule measuring 4 mm.  Bilateral pleural effusions were stable.  REVIEW OF SYSTEMS:  Review of Systems  Constitutional:  Positive for appetite change, fatigue and unexpected weight change. Negative for chills and fever.  HENT:   Negative for lump/mass, mouth sores and sore throat.   Respiratory:  Positive for shortness of breath. Negative for cough.   Cardiovascular:  Negative for chest pain and leg swelling.  Gastrointestinal:  Positive for abdominal pain. Negative for constipation, diarrhea, nausea and vomiting.  Endocrine: Negative for hot flashes.  Genitourinary:  Negative for difficulty urinating, dysuria, frequency and hematuria.   Musculoskeletal:  Negative for arthralgias, back pain and myalgias.  Skin:  Negative for rash.  Neurological:  Negative for dizziness and headaches.  Hematological:  Negative for adenopathy. Does not bruise/bleed easily.  Psychiatric/Behavioral:  Negative for depression and sleep disturbance. The patient is not nervous/anxious.      VITALS:  Blood pressure 133/61, pulse 81, temperature 98.2 F (36.8 C), temperature source Oral, resp. rate 18, height 5\' 2"  (1.575 m), weight 145 lb 12.8 oz (66.1 kg), SpO2 98%.  Wt Readings from Last 3 Encounters:  07/02/23 145 lb 12.8 oz (66.1 kg)  06/27/23 148 lb 1.9 oz (67.2 kg)  06/25/23 146 lb 11.2 oz (66.5 kg)    Body mass index is 26.67 kg/m.  Performance status (ECOG): 2 - Symptomatic, <50% confined to bed  PHYSICAL EXAM:  Physical Exam Vitals and nursing note reviewed.  Constitutional:       General: She is not in acute distress.    Appearance: Normal appearance.  HENT:     Head: Normocephalic and atraumatic.     Mouth/Throat:     Mouth: Mucous membranes are moist.     Pharynx: Oropharynx is clear. No oropharyngeal exudate or posterior oropharyngeal erythema.  Eyes:     General: No  scleral icterus.    Extraocular Movements: Extraocular movements intact.     Conjunctiva/sclera: Conjunctivae normal.     Pupils: Pupils are equal, round, and reactive to light.  Cardiovascular:     Rate and Rhythm: Normal rate and regular rhythm.     Heart sounds: Normal heart sounds. No murmur heard.    No friction rub. No gallop.  Pulmonary:     Effort: Pulmonary effort is normal.     Breath sounds: Examination of the right-lower field reveals decreased breath sounds. Decreased breath sounds present. No wheezing, rhonchi or rales.  Abdominal:     General: There is no distension.     Palpations: Abdomen is soft. There is no mass.     Tenderness: There is abdominal tenderness in the epigastric area.  Musculoskeletal:        General: Normal range of motion.     Cervical back: Normal range of motion and neck supple. No tenderness.     Right lower leg: No edema.     Left lower leg: No edema.  Lymphadenopathy:     Cervical: No cervical adenopathy.  Skin:    General: Skin is warm and dry.     Coloration: Skin is not jaundiced.     Findings: No rash.  Neurological:     Mental Status: She is alert and oriented to person, place, and time.     Cranial Nerves: No cranial nerve deficit.  Psychiatric:        Mood and Affect: Mood normal.        Behavior: Behavior normal.        Thought Content: Thought content normal.     LABS:      Latest Ref Rng & Units 07/02/2023   12:00 AM 06/25/2023   12:00 AM 06/20/2023   12:00 AM  CBC  WBC  3.9     4.2     3.1      Hemoglobin 12.0 - 16.0 9.8     10.4     10.3      Hematocrit 36 - 46 28     29     30       Platelets 150 - 400 K/uL 299     233      182         This result is from an external source.      Latest Ref Rng & Units 07/02/2023   12:00 AM 06/25/2023   12:00 AM 06/20/2023   12:00 AM  CMP  BUN 4 - 21 9     11     8       Creatinine 0.5 - 1.1 0.7     0.9     0.9      Sodium 137 - 147 124     126     125      Potassium 3.5 - 5.1 mEq/L 4.2     4.4     4.2      Chloride 99 - 108 90     93     90      CO2 13 - 22 29     30     29       Calcium 8.7 - 10.7 9.4     10.1     9.6      Alkaline Phos 25 - 125 135     107     105      AST 13 - 35  41     40     37      ALT 7 - 35 U/L 20     24     21          This result is from an external source.     Lab Results  Component Value Date   CEA1 1.6 10/11/2021   /  CEA  Date Value Ref Range Status  10/11/2021 1.6 0.0 - 4.7 ng/mL Final    Comment:    (NOTE)                             Nonsmokers          <3.9                             Smokers             <5.6 [employer] Electrochemiluminescence Immunoassay (ECLIA) Values obtained with different assay methods or kits cannot be used interchangeably.  Results cannot be interpreted as absolute evidence of the presence or absence of malignant disease. Performed At: [facility] [address] [place], [place] [id number] Jolene Schimke MD WG:[id number]    No results found for: "PSA1" No results found for: "[id number]" No results found for: "CAN125"  No results found for: "TOTALPROTELP", "ALBUMINELP", "A1GS", "A2GS", "BETS", "BETA2SER", "GAMS", "MSPIKE", "SPEI" No results found for: "TIBC", "FERRITIN", "IRONPCTSAT" No results found for: "LDH"  STUDIES:    Exam(s): [id number] CT/CT CHEST-ABD-PELV W/IV CM  CLINICAL DATA: Follow-up metastatic breast carcinoma. Restaging.  Diverticulitis. * Tracking Code: BO *  EXAM:  CT CHEST, ABDOMEN, AND PELVIS WITH CONTRAST  TECHNIQUE:  Multidetector CT imaging of the chest, abdomen and pelvis was  performed following the standard protocol during bolus  administration of  intravenous contrast.  RADIATION DOSE REDUCTION: This exam was performed according to the  departmental dose-optimization program which includes automated  exposure control, adjustment of the mA and/or kV according to  patient size and/or use of iterative reconstruction technique.  CONTRAST: 80 mL Isovue 370  COMPARISON: 03/31/2023  FINDINGS:  CT CHEST FINDINGS  Cardiovascular: No acute findings.  Mediastinum/Lymph Nodes: No pathologically enlarged lymph nodes  identified. Stable 9 mm right thyroid lobe nodule.  Lungs/Pleura: Stable large right pleural effusion and tiny left  pleural effusion. Stable compressive atelectasis in right middle and  lower lobes. 4 mm right middle lobe pulmonary nodule on image 70/301  remains stable. A 3 mm right lower lobe pulmonary nodule on image  90/301 shows mild increase in size since previous study.  Musculoskeletal: No suspicious bone lesions identified.  CT ABDOMEN AND PELVIS FINDINGS  Hepatobiliary: Multiple small low-attenuation masses are seen  throughout the liver. Most of these are stable,, however, dominant  mass in the right hepatic lobe currently measures 2.9 x 2.6 cm on  image 75/2, compared to 2.4 x 2.3 cm previously. Tiny calcified  gallstone noted, however, there is no evidence of cholecystitis or  biliary ductal dilatation.  Pancreas: No mass or inflammatory changes.  Spleen: Within normal limits in size. Stable small splenic cyst  noted.  Adrenals/Urinary tract: No suspicious masses or hydronephrosis.  Stomach/Bowel: No evidence of obstruction, inflammatory process, or  abnormal fluid collections.  Vascular/Lymphatic: No pathologically enlarged lymph nodes  identified. No acute vascular findings.  Reproductive: Stable posterior uterine fibroid with central  calcification measuring approximately 4 cm in diameter. No  other  mass or free fluid identified.  Other: Rectus diastasis noted. A small left lower quadrant ventral  hernia  is again seen which contains only fat.  Musculoskeletal: No suspicious bone lesions identified. Right hip  prosthesis noted, as well as severe left hip osteoarthritis  IMPRESSION:  Mild increase in size of dominant hepatic metastasis in right lobe.  Other diffuse small hepatic metastases show no significant change.  Increased 3 mm right lower lobe pulmonary nodule and stable 4 mm  right middle lobe nodule. Pulmonary metastases cannot be excluded.  Stable large right pleural effusion and tiny left pleural effusion.  Stable 4 cm uterine fibroid.  Cholelithiasis. No radiographic evidence of cholecystitis.    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  Report date is 11/29/21.   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   Malignant neoplasm of upper-outer  quadrant of left female breast (HCC) 03/07/2006   Mixed dyslipidemia 11/03/2018   Numbness in right leg    Hx: of   Obesity (BMI 30.0-34.9) 09/21/2020   Other specified disorders of bone density and structure, multiple sites 09/20/2020   Paroxysmal atrial fibrillation (HCC) 02/13/2016   Personal history of COVID-19 09/2019   Pleural effusion, 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   Pneumonia    Hx: of 'a long time ago"   Postoperative examination 02/05/2016    Past Surgical History:  Procedure Laterality Date   APPENDECTOMY     BACK SURGERY     BREAST SURGERY     Hx: of lumpectomy left breast 2007   COLONOSCOPY     Hx: of   DILATION AND CURETTAGE OF UTERUS     TONSILLECTOMY     TOTAL HIP ARTHROPLASTY Right 04/13/2013   Dr Magnus Ivan   TOTAL HIP ARTHROPLASTY Right 04/13/2013   Procedure: RIGHT TOTAL HIP ARTHROPLASTY ANTERIOR APPROACH;  Surgeon: Kathryne Hitch, MD;  Location: [facility] OR;  Service: Orthopedics;  Laterality: Right;   TUBAL LIGATION      Family History  Problem Relation Age of Onset   Hypertension Sister    Cancer Sister 53       uterine or ovarian   Bone cancer Sister 61   Breast cancer Sister        dx 43 and 63   Uterine cancer Sister 109   Colon cancer Sister 29   Breast cancer Sister 22   Hypertension Brother    Kidney cancer Brother 38  Cancer Brother 61       unknown type; mets   Liver cancer Brother    Liver cancer Brother 29   Pancreatic cancer Niece 27   Breast cancer Niece 68   Colon cancer Nephew 57       mets    Social History:  reports that she has never smoked. She has never used smokeless tobacco. She reports that she does not drink alcohol and does not use drugs.The patient is alone today.  Allergies:  Allergies  Allergen Reactions   Codeine     unknown    Sulfa Antibiotics     unknown   Sulfacetamide Sodium Other (See Comments)    unknown   Chlorhexidine Rash    Current Medications: Current Outpatient Medications  Medication Sig  Dispense Refill   abemaciclib (VERZENIO) 100 MG tablet Take 1 tablet (100 mg total) by mouth daily. 30 tablet 0   apixaban (ELIQUIS) 5 MG TABS tablet TAKE 1 TABLET BY MOUTH TWICE A DAY 180 tablet 1   atorvastatin (LIPITOR) 10 MG tablet Take 1 tablet by mouth every other day.  4   benzonatate (TESSALON) 100 MG capsule Take 1 capsule (100 mg total) by mouth 3 (three) times daily as needed for cough. (Patient not taking: Reported on 06/18/2023) 20 capsule 5   calcium carbonate (OSCAL) 1500 (600 Ca) MG TABS tablet Take 600 mg of elemental calcium by mouth daily with breakfast.     cholecalciferol (VITAMIN D) 1000 UNITS tablet Take 1,000 Units by mouth daily.     denosumab (XGEVA) 120 MG/1.7ML SOLN injection Inject 120 mg into the skin as directed. Once every 4 weeks     Flaxseed, Linseed, OIL Take 1 capsule by mouth daily.     Fulvestrant (FASLODEX IM) Inject into the muscle. 500 mg injection every 4 weeks     glucosamine-chondroitin 500-400 MG tablet Take 1 tablet by mouth 2 (two) times daily.      levothyroxine (SYNTHROID, LEVOTHROID) 75 MCG tablet Take 75 mcg by mouth daily.   7   metoprolol tartrate (LOPRESSOR) 25 MG tablet TAKE 1 TABLET (25 MG TOTAL) BY MOUTH 2 (TWO) TIMES DAILY. PATIENT NEEDS APPOINTMENT FOR FURTHER REFILLS. 1 ST ATTEMPT 60 tablet 0   Multiple Vitamin (MULTIVITAMIN WITH MINERALS) TABS Take 1 tablet by mouth daily.     ondansetron (ZOFRAN) 4 MG tablet Take 1 tablet (4 mg total) by mouth every 4 (four) hours as needed for nausea or vomiting. 30 tablet 5   prochlorperazine (COMPAZINE) 10 MG tablet Take 1 tablet (10 mg total) by mouth every 6 (six) hours as needed for nausea or vomiting. 30 tablet 5   No current facility-administered medications for this visit.

## 2023-07-02 NOTE — Assessment & Plan Note (Addendum)
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 Her calcium was 10.5 last week and she received her monthly denosumab last week.  Calcium is in normal range today.

## 2023-07-03 ENCOUNTER — Other Ambulatory Visit: Payer: Self-pay

## 2023-07-04 ENCOUNTER — Telehealth: Payer: Self-pay

## 2023-07-04 NOTE — Telephone Encounter (Signed)
-----   Message from [name] sent at 07/03/2023  4:49 PM EDT ----- Please let her know additional labs did not show any other reason for low sodium, so keep limiting plain water to 1 bottle a day, with total fluid intake of 1.5 L. Until we see her next week. Thanks

## 2023-07-04 NOTE — Telephone Encounter (Signed)
Error

## 2023-07-08 ENCOUNTER — Encounter: Payer: Self-pay | Admitting: Oncology

## 2023-07-09 ENCOUNTER — Inpatient Hospital Stay: Payer: Medicare HMO

## 2023-07-09 ENCOUNTER — Inpatient Hospital Stay (INDEPENDENT_AMBULATORY_CARE_PROVIDER_SITE_OTHER): Payer: Medicare HMO | Admitting: Oncology

## 2023-07-09 ENCOUNTER — Inpatient Hospital Stay (HOSPITAL_BASED_OUTPATIENT_CLINIC_OR_DEPARTMENT_OTHER): Payer: Medicare HMO

## 2023-07-09 ENCOUNTER — Inpatient Hospital Stay: Payer: Medicare HMO | Attending: Hematology and Oncology

## 2023-07-09 ENCOUNTER — Other Ambulatory Visit (HOSPITAL_COMMUNITY): Payer: Self-pay

## 2023-07-09 ENCOUNTER — Telehealth: Payer: Self-pay

## 2023-07-09 ENCOUNTER — Encounter: Payer: Self-pay | Admitting: Oncology

## 2023-07-09 ENCOUNTER — Telehealth: Payer: Self-pay | Admitting: Pharmacy Technician

## 2023-07-09 ENCOUNTER — Other Ambulatory Visit: Payer: Self-pay | Admitting: Oncology

## 2023-07-09 VITALS — BP 123/56 | HR 70 | Temp 97.6°F | Resp 18

## 2023-07-09 VITALS — BP 109/55 | HR 73 | Temp 98.0°F | Resp 18 | Ht 62.0 in | Wt 143.7 lb

## 2023-07-09 DIAGNOSIS — C787 Secondary malignant neoplasm of liver and intrahepatic bile duct: Secondary | ICD-10-CM | POA: Diagnosis not present

## 2023-07-09 DIAGNOSIS — Z170 Estrogen receptor positive status [ER+]: Secondary | ICD-10-CM

## 2023-07-09 DIAGNOSIS — C50412 Malignant neoplasm of upper-outer quadrant of left female breast: Secondary | ICD-10-CM

## 2023-07-09 DIAGNOSIS — C7951 Secondary malignant neoplasm of bone: Secondary | ICD-10-CM | POA: Diagnosis not present

## 2023-07-09 DIAGNOSIS — D649 Anemia, unspecified: Secondary | ICD-10-CM | POA: Diagnosis not present

## 2023-07-09 DIAGNOSIS — E222 Syndrome of inappropriate secretion of antidiuretic hormone: Secondary | ICD-10-CM

## 2023-07-09 LAB — CBC AND DIFFERENTIAL
HCT: 29 — AB (ref 36–46)
Hemoglobin: 10 — AB (ref 12.0–16.0)
Neutrophils Absolute: 2.96
Platelets: 320 10*3/uL (ref 150–400)
WBC: 3.9

## 2023-07-09 LAB — HEPATIC FUNCTION PANEL
ALT: 32 U/L (ref 7–35)
AST: 64 — AB (ref 13–35)
Alkaline Phosphatase: 185 — AB (ref 25–125)
Bilirubin, Total: 0.8

## 2023-07-09 LAB — BASIC METABOLIC PANEL
BUN: 11 (ref 4–21)
CO2: 30 — AB (ref 13–22)
Chloride: 89 — AB (ref 99–108)
Creatinine: 0.7 (ref 0.5–1.1)
Glucose: 144
Potassium: 4.7 meq/L (ref 3.5–5.1)
Sodium: 123 — AB (ref 137–147)

## 2023-07-09 LAB — COMPREHENSIVE METABOLIC PANEL
Albumin: 3.6 (ref 3.5–5.0)
Calcium: 9.1 (ref 8.7–10.7)

## 2023-07-09 LAB — CBC: RBC: 3 — AB (ref 3.87–5.11)

## 2023-07-09 LAB — CBC W DIFFERENTIAL (~~LOC~~ CC SCANNED REPORT)

## 2023-07-09 LAB — COMPREHENSIVE METABOLIC PANEL (ASHBORO CC SCANNED REPORT)

## 2023-07-09 MED ORDER — DEXAMETHASONE 0.5 MG/5ML PO SOLN: 0.5000 mg | Freq: Two times a day (BID) | ORAL | 0 refills | Status: DC

## 2023-07-09 MED ORDER — EXEMESTANE 25 MG PO TABS
25.0000 mg | ORAL_TABLET | Freq: Every day | ORAL | 5 refills | Status: DC
Start: 2023-07-09 — End: 2023-09-08

## 2023-07-09 MED ORDER — SODIUM CHLORIDE 0.9 % IV SOLN: Freq: Once | INTRAVENOUS | Status: AC

## 2023-07-09 NOTE — Telephone Encounter (Signed)
Oral Oncology Patient Advocate Encounter   Received notification that prior authorization for everolimus is required.   PA submitted on 07/09/23 Key [id number] Status is pending     Jinger Neighbors, CPhT-Adv Oncology Pharmacy Patient Advocate [facility] Direct Number: [phone]  Fax: [phone]

## 2023-07-09 NOTE — Patient Instructions (Signed)
Dehydration, Older Adult Dehydration is a condition in which there is not enough water or other fluids in the body. This happens when a person loses more fluids than they take in. Important organs cannot work right without the right amount of fluids. Any loss of fluids from the body can cause dehydration. People 84 years of age or older have a higher risk of dehydration than younger adults. This is because in older age, the body: Is less able to keep the right amount of water. Does not respond well to temperature changes. Does not feel thirst easily or quickly. Dehydration can be mild, worse, or very bad. It should be treated right away to keep it from getting very bad. What are the causes? Conditions that cause loss of water or other fluids, such as: Watery poop (diarrhea). Vomiting. Sweating a lot. Fever. Infection. Peeing (urinating) a lot. Not drinking enough fluids. Medicines, such as medicines that take extra fluid out of the body (diuretics). Lack of safe drinking water. Not being able to get enough water and food. What increases the risk? Having a long-term (chronic) illness that has not been treated the right way, such as: Diabetes. Kidney, heart, or lung disease. Other conditions, such as dementia. Dementia affects your thinking and emotions. Being 55 years of age or older. Having a disability. Living in a place that is high above the ground or sea level (high in altitude). The thinner, drier air causes more fluid loss. Being active when in hot places. What are the signs or symptoms? Symptoms of dehydration depend on how bad it is. Mild or worse dehydration Thirst. Dry lips or dry mouth. Feeling dizzy or light-headed. Muscle cramps. Dark pee (urine). Pee may be the color of tea. Less pee than normal. Less tears than normal. Headache. Very bad dehydration Changes in the skin. Skin may: Be cold to the touch (clammy). Be blotchy or pale. Not go back to normal right  after you pinch it and let it go. Little or no tears, pee, or sweat. Fast breathing. Low blood pressure. Weak pulse. Pulse that is more than 100 beats a minute when you are sitting still. Other changes, such as: Feeling very thirsty. Eyes that look hollow (sunken). Cold hands and feet. Being confused. Being very tired (lethargic) or having trouble waking from sleep. Weight loss. Loss of consciousness. How is this treated? Treatment for this condition depends on how bad it is. Treatment should start right away. Do not wait until your condition gets very bad. Very bad dehydration is an emergency. You will need to go to a hospital. Mild or worse dehydration can be treated at home. You may be asked to: Drink more fluids. Drink an oral rehydration solution (ORS). This drink gives you the right amount of fluids, salts, and minerals (electrolytes). Very bad dehydration can be treated: With fluids through an IV tube. By correcting low levels of electrolytes in the body. By treating the problem that caused your dehydration. Follow these instructions at home: Oral rehydration solution If told by your doctor, drink an ORS: Make an ORS. Use instructions on the package. Start by drinking small amounts, about  cup (120 mL) every 5-10 minutes. Slowly drink more until you have had the amount your doctor said to have.  Eating and drinking  Drink enough clear fluid to keep your pee pale yellow. If you were told to drink an ORS, finish the ORS first. Then, start slowly drinking other clear fluids. Drink fluids such as: Water. Do  not drink only water. Doing that can make the salt (sodium) level in your body get too low. Water from ice chips you suck on. Fruit juice that you have added water to (diluted). Low-calorie sports drinks. Eat foods that have the right amounts of salts and minerals, such as: Bananas. Oranges. Potatoes. Tomatoes. Spinach. Do not drink alcohol. Avoid drinks that have a  lot of sugar. These include: High-calorie sports drinks. Fruit juice that you did not add water to. Soda. Caffeine. Avoid foods that are greasy or have a lot of fat or sugar. General instructions Take over-the-counter and prescription medicines only as told by your doctor. Do not take sodium tablets. Doing that can make your sodium levels high. Return to your normal activities as told by your doctor. Ask your doctor what activities are safe for you. Keep all follow-up visits. Your doctor may need to check or change your treatment. Contact a doctor if: You have pain in your belly (abdomen) and the pain: Gets worse. Stays in one place. You have a rash. You have a stiff neck. You get angry or annoyed (irritable) more easily than normal. You are more tired or have a harder time waking than normal. You feel weak or dizzy. You feel very thirsty. Get help right away if: You have symptoms of very bad dehydration. You have a fever. You have a very bad headache. You have vomiting that gets worse or does not go away. Or if: There is blood or green matter in your vomit. You cannot eat or drink without vomiting. You have diarrhea that gets worse or does not go away. You have blood in your stool (feces). This may cause stool to look black and tarry. You have dark pee or no pee. Or you pee only a little in 6-8 hours. You have trouble breathing. You have symptoms that get worse with treatment. These symptoms may be an emergency. Get help right away. Call 911. Do not wait to see if the symptoms will go away. Do not drive yourself to the hospital. This information is not intended to replace advice given to you by your health care provider. Make sure you discuss any questions you have with your health care provider. Document Revised: 04/22/2022 Document Reviewed: 04/22/2022 Elsevier Patient Education  2024 [employer].

## 2023-07-09 NOTE — Telephone Encounter (Signed)
Starting oral chemo with Afinitor

## 2023-07-09 NOTE — Telephone Encounter (Signed)
CRITICAL VALUE STICKER  CRITICAL VALUE: Sodium 123  RECEIVER (on-site recipient of call): Lucious Zou,RN  DATE & TIME NOTIFIED: 07/09/23 @ 1349  MESSENGER (representative from lab):  Shanda Bumps from [facility] lab  MD NOTIFIED: Dr Gilman Buttner  TIME OF NOTIFICATION: 1405  RESPONSE:  Pt in clinic - instructions given by Dr Gilman Buttner - going to infusion for 1 L NS now and will plan weekly after weekly OV and labs, prefer Wed so can alternate with Kaitlyn. Stop the Faslodex

## 2023-07-09 NOTE — Telephone Encounter (Signed)
Oral Oncology Patient Advocate Encounter  Prior Authorization for everolimus has been approved.    PA# [id number] Effective dates: 07/09/23 through 10/07/23  Patients co-pay is $0.  Patient hs met OOP max for 2024 and will need to be evaluated for assistance needs in 2025.   Jinger Neighbors, CPhT-Adv Oncology Pharmacy Patient Advocate [facility] Direct Number: [phone]  Fax: [phone]

## 2023-07-10 ENCOUNTER — Other Ambulatory Visit: Payer: Self-pay | Admitting: Cardiology

## 2023-07-10 ENCOUNTER — Other Ambulatory Visit: Payer: Self-pay | Admitting: Hematology and Oncology

## 2023-07-10 ENCOUNTER — Other Ambulatory Visit (HOSPITAL_COMMUNITY): Payer: Self-pay

## 2023-07-10 ENCOUNTER — Other Ambulatory Visit: Payer: Self-pay | Admitting: Pharmacy Technician

## 2023-07-10 ENCOUNTER — Other Ambulatory Visit: Payer: Self-pay

## 2023-07-10 ENCOUNTER — Other Ambulatory Visit: Payer: Self-pay | Admitting: Oncology

## 2023-07-10 ENCOUNTER — Inpatient Hospital Stay: Payer: Medicare HMO | Admitting: Dietician

## 2023-07-10 DIAGNOSIS — Z170 Estrogen receptor positive status [ER+]: Secondary | ICD-10-CM

## 2023-07-10 LAB — CANCER ANTIGEN 27.29: CA 27.29: 611.4 U/mL — ABNORMAL HIGH (ref 0.0–38.6)

## 2023-07-10 MED ORDER — EVEROLIMUS 5 MG PO TABS
5.0000 mg | ORAL_TABLET | Freq: Every day | ORAL | 5 refills | Status: DC
Start: 2023-07-10 — End: 2023-08-01
  Filled 2023-07-10 (×2): qty 30, 30d supply, fill #0

## 2023-07-10 NOTE — Progress Notes (Signed)
Nutrition Assessment: Reached out to patient at home telephone number.    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  She is under treatment with abemaciclib which was just does reduced.  She has hyponatremia and was asked to reduce her water to 1 bottle per day.  Much of her PO intake has been liquid lately.  She has no food allergies but isn't tolerating much now due to dysgeusia.  She relayed "Im living on applesauce" also  vomited 30 minutes ago. Fearful of foods that she won't tolerate.  She also reports early satiety and that it take 2-3 days for her to get a cookout milk shake down.  Most of her nourishment is from Ensure or Ensure Plus --- 3 per day (but also reports coat on teeth gets to gagging the past 2-3 mornings) Other fluids: Pedialyte 3 cups/ day, milk shakes from [employer] out, was drinking 2-3 glasses/day has stopped since MD follow up.  Was taking imodium now feels constipated.  Had small BM yesterday (took 2 stool softeners last night)    Nutrition Focused Physical Exam: unable to perform NFPE   Medications: using Zofran daily, MVI, Ca+, vit D   Labs: 07/09/23  Na+ (trending low past 2 months) 123, Hgb 10   Anthropometrics:  weight loss 6# (4%) past month, 14.7 (9.3%) past 4 months  Height: 62" Weight:  07/09/23   143# 06/13/23  149#  UBW: 160# BMI: 26.28   Estimated Energy Needs  Kcals: 2000 Protein: 78-98 Fluid: 2L   NUTRITION DIAGNOSIS: Inadequate PO intake to meet increased nutrient needs with cancer diagnosis r/t NIS including taste changes, anorexia, and early satiety  INTERVENTION:   Relayed that nutrition services are wrap around service provided at no charge and encouraged continued communication if experiencing any nutritional impact symptoms (NIS). Educated on importance of adequate nourishment with  calorie and protein energy intake with nutrient dense foods when possible to maintain weight/strength and QOL.   Relayed strategies for taste changes and encouraged baking soda, salt swishes. Encouraged use of fruits between ONS to aid in increased calories and bowel regularity Suggested oral nutrition supplements 1.5 calories 4-6/day  Discussed strategies for increasing protein trial vanilla Greek yogurt Mailed Nutrition Tip sheet  for  taste changes and coupons for Ensure with contact information provided.  MONITORING, EVALUATION, GOAL: weight trends, nutrition impact symptoms, PO intake, labs   Next Visit: Remote 2 weeks  Gennaro Africa, RDN, LDN Registered Dietitian, [facility] Part Time Remote (Usual office hours: Tuesday-Thursday) Mobile: [phone]

## 2023-07-10 NOTE — Progress Notes (Signed)
Oral Oncology Pharmacist Encounter  Patient counseled in patient visit encounter on 07/09/2023.  Bethel Born, PharmD Hematology/Oncology Clinical Pharmacist [facility] [phone]

## 2023-07-10 NOTE — Progress Notes (Signed)
Specialty Pharmacy Initial Fill Coordination Note  Carrie Wilkerson is a 82 y.o. female contacted today regarding refills of specialty medication(s) Everolimus (Antineoplastic Enzyme Inhibitors) .  Patient requested Delivery  on 07/15/23  to verified address [address]   [place] [address]   Medication will be filled on 07/11/23.   Patient is aware of $0 copayment.

## 2023-07-11 ENCOUNTER — Other Ambulatory Visit: Payer: Self-pay

## 2023-07-11 NOTE — Telephone Encounter (Signed)
Oral Oncology Pharmacist Encounter  Received new prescription for Afinitor (everolimus) for the treatment of metastatic breast cancer in conjunction with exemestane, planned duration until disease progression or unacceptable toxicity.  Labs from 07/09/23 assessed, no interventions needed. Prescription dose and frequency assessed for appropriateness.   Current medication list in Epic reviewed, DDIs with Everolimus identified: - atorvastatin (cat B): atorvastatin may decrease the concentration of everolimus. Everolimus may also increase the concentration of atorvastatin. No action needed.   Evaluated chart and no patient barriers to medication adherence noted.   Patient agreement for treatment documented in MD note on 07/09/23.  Prescription has been e-scribed to the [facility] for benefits analysis and approval.  Oral Oncology Clinic will continue to follow for insurance authorization, copayment issues, initial counseling and start date.  Bethel Born, PharmD Hematology/Oncology Clinical Pharmacist [facility] [phone]

## 2023-07-14 ENCOUNTER — Encounter: Payer: Self-pay | Admitting: Oncology

## 2023-07-15 ENCOUNTER — Encounter: Payer: Self-pay | Admitting: Oncology

## 2023-07-16 ENCOUNTER — Encounter: Payer: Self-pay | Admitting: Hematology and Oncology

## 2023-07-16 NOTE — Progress Notes (Signed)
[facility]  [address] 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 CT chest in December revealed right middle and lower lobe collapse with large right pleural effusion. PET was negative.  She required thoracentesis on 2 occasions, the last in January.  Cytology revealed malignant cells consistent with breast primary, ER/PR positive.  She was started on anastrozole 1 mg daily on October 11, 2021.  Oral chemotherapy with palbociclib 125 mg was added in February.  After 1 cycle, she developed severe neutropenia. Her 2nd cycle was postponed 1 week.  Prior to a 3rd cycle, she had recurrent neutropenia, so her chemotherapy was held for 2 weeks and the palbociclib dose reduced to 100 mg daily.  She had persistent neutropenia and the dose was decreased to 75mg  daily.  CT chest, abdomen and pelvis on July 24 revealed a decrease in the large right pleural effusion and compressive right lung atelectasis. Now her scan in January, 2024 reveals a new nodule in  the right middle lobe measuring 7mm. But more concerning are the new lesions in the liver as of February 1st, 2024. She still has a large right pleural effusion that remains stable. 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 Restaging scans in June, 2024 revealed progression of her liver metastasis and treatment was changed to Faslodex injections and Verzenio at 100 mg twice daily. She was doing very poorly and the treatment has been stopped. We are considering Afinitor and Aromasin but are not sure if the benefit outweighs the risk of toxicities.    Right Lung Mass She has a hypermetabolic right infrahilar mass measuring  2.4cm with an SUV 16.87 found in February, 2024. This was found to be metastatic breast cancer. She has never smoked.  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 The lung nodule decreased from 7mm to 6mm, but she now has increased liver lesions so it was stopped.    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 We therefore obtained a liver biopsy to confirm the diagnosis and this is positive for metastatic breast cancer, ER positive and HER2 negative. 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  Her restaging scans now show moderate progression of her liver metastasis with innumerable lesions in both lobes including multiple new lesions. Fortunately she is asymptomatic from this and her liver function tests are essentially normal. We will therefore stop the Orserdu and change her to Faslodex injections 500mg  monthly along with Verzenio 100mg  BID on April 04, 2023. We started at a reduced dose of this due to her advanced age and multiple prior treatments. I decreased the Verzenio to once daily in early September and we have now stopped it since she still had severe toxicities of nausea, anorexia and bad taste with significant weight loss.    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  She had recurrent disease found in December 2022.   Malignant Neoplasm Metastatic to Bone On PET scans she has multiple bone lesions including the right 11th rib, left iliac bone, and left sacrum. She also has metastases noted in muscle tissue, bilateral paraspinal musculature. She is now  on Xgeva every 4 weeks. Fortunately she is asymptomatic from her bone metastasis.    Osteopenia Osteopenia, for which she has been receiving denosumab every 6 months, next due in September.  She continues calcium and vitamin-D as recommended.  She had a repeat bone density in December, 2023, which is stable.  Her last dose was given in September. She bolus changed to monthly injections due to her bone metastases.    Lymphedema This is 1+ of her left upper extremity, noted since 2023.  I gave her some literature about the diagnosis of lymphedema.  I find no adenopathy or tumor of the axilla.     Hyponatremia This is fairly severe today and has decreased from 128 to 125. We will give her IV normal saline today and repeat the dexamethasone 8mg  IV. I will check a urine osmolality for SIADH. I encouraged her to include salt in her diet and Gatorade instead of free water. Unfortunately she is not fond of that. We will need to monitor and manage this closely to avoid hospitalization.   Anemia This is slightly worse with a hemoglobin of 10.3 today and likely related to her Verzenio.   Leukopenia Her WBC is a little better at 3.1 with a mildly low ANC of 1550.  This has improved with the decrease in her Verzenio dose.   Plan: We had stopped the Verzenio and now the Faslodex as well due to toxicities. I have prescribed Afinitor and Aromasin and she met with the pharmacist to review the potential toxicities but has not started this yet. Her performance status has declined significantly. Her WBC was 3.9, hemoglobin low at 10.0 but improved from 9.8, and her platelet count was 320,000 as of 07/09/2023. Her sodium was low at 123, her AST was elevated at 64, prior 41, and her alkaline phosphate was elevated at 185 prior was 135 as of 07/09/2023. Her WBC is 5.1, hemoglobin is improved to 11.3, and platelet count of 427,000 as of today. Today her sodium has dropped even lower to 116 and had a worsening elevation of  an AST of 95 and ALT of 51. I informed her that she will need to be admitted into the [facility] until she is stable.  She has not started Afinitor and Aromasin and I recommended she not begin that yet. I reassured her that we will continue to treat her for any complications that come up. She could consider trying the hormonal therapy but we are not sure whether taking the Afinitor will cause enough benefit to be worth it. I do feel the severe hyponatremia is caused by her malignancy and may be difficult to control. We discussed code status and she wishes to be a DNR. If in the event of cardiac arrest, she would not wish to be resuscitated. I also discussed the option of Hospice with her and her family and what that entails such as stopping treatment. At this time we will stop her Faslodex injections.  She and her family will think about the option of Hospice. Once she is discharged from the [facility], we will plan close follow-up weekly with labs and possible IV fluids unless she opts for  Hospice. The patient, her husband, and daughter understand the plans discussed today and are in agreement with them.  I reviewed all of this information with her today and answered their questions.  She knows to contact our office if she develops concerns prior to her next appointment.  I provided 30 minutes of face-to-face time during this encounter and > 50% was spent counseling as documented under my assessment and plan.   Dellia Beckwith, MD  [facility] AT [facility] [address] [place] [address] Dept: [phone] Dept Fax: [phone]   No orders of the defined types were placed in this encounter.    CHIEF COMPLAINT:  CC: Recurrent hormone receptor positive breast cancer  Current Treatment: Abemaciclib/fulvestrant/denosumab  HISTORY OF PRESENT ILLNESS:  Carrie [facility] is a 82 y.o. female with a history of stage IIIA (T1c N2a M0)  hormone receptor positive left breast cancer diagnosed in June 2007. She was treated with lumpectomy.  Pathology revealed a 1.5 cm, grade 1, invasive ductal carcinoma with 5 positive nodes.  Estrogen and progesterone receptors were positive and Her-2 Neu negative.  She received adjuvant chemotherapy with Adriamycin/Cytoxan for 3 months, followed by Taxol for 3 months.  She then received adjuvant radiation to the left breast.  She was placed on letrozole in April 2008 and completed 10 years of adjuvant hormonal therapy in April 2018.  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   Repeat bone density in September 2017 revealed slight improvement in the bone density, with a T-score of -2.1 in the forearm and a T-score of -1.1 in the femur with, so she has continued denosumab every 6 months, in addition to calcium/vitamin D.  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    She had reversal of her colostomy in October 2017.  She has an incisional hernia in the abdomen, which does not bother her, so she has decided against surgical repair.  She has atrial fibrillation and is on apixaban 5 mg twice daily.  Bone density scan in September 2019 revealed improvement in the bone density with a T-score of -1.6 in forearm.  The bone density of the femur had normalized with a T-score of -0.8.  She has continued denosumab every 6 months.  Annual bilateral mammogram in December 2020 did not reveal any evidence of malignancy.  She contracted COVID-19 later in December 2020.  She did not have to be hospitalized and fully recovered.  Bone density from September 2021 revealed mildly worsened osteopenia with a T-score of -2.0 of the left forearm radius, previously -1.6.  Left femur neck is normal at -0.9, previously -0.8.  She continues calcium 1200 mg with  vitamin-D daily.  She presented to the emergency department in December 2022 due to shortness of breath and left substernal area pain. ECHO revealed a normal EF between 60-65%. CT chest revealed right middle and lower lobe collapse with large right pleural effusion. There was no central obstructing mass lesion evident, or obvious right-sided pleural disease, although there is a irregular focus of soft tissue attenuation along the medial right upper lobe pleura/right anterior mediastinum. There were scattered tiny bilateral pulmonary nodules measuring up to 4 mm. Thoracentesis was pursued yielding 1200 cc's of pleural fluid. Cytopathology confirmed malignant cells consistent with breast primary.  CKAE1/3, GATA-3 and ER positive. HER2 was negative 1+. Estrogen receptor was positive at 90% and progesterone receptor was positive at 20%. Ki67 was <1%. MRI brain was negative for evidence of metastasis. PET imaging in January 2023 revealed no hypermetabolic activity to localize active metastatic breast carcinoma. Large right pleural effusion occupies 75% of the right hemithorax. Along the anteromedial margin of the right diaphragm there is hypermetabolic linear thickening of the diaphragm. She had a 2nd thoracentesis in January.  She was placed on anastrozole and palbociclib 125 mg daily for 3 weeks on and 1 week off.  She did not required further thoracentesis.  The dose of palbociclib was reduced to 100 mg daily with the 3rd cycle due to neutropenia.  The CA 27-29 had decreased from 123.6 to 40.4.  The dose of palbociclib was decreased again to 75 mg daily for 3 weeks on and 1 week off with her 5th cycle due to neutropenia.   CT imaging in July 2023 revealed a decrease in the right pleural effusion and atelectasis without new or progressive disease.  She continued anastrozole/palbociclib which was eventually switched to 3 weeks on and 2 weeks off due to cytopenias.  Bone density scan in December 2023 revealed slightly  worsened osteopenia with a T-score of -2.2 in the left forearm, previously -2.  Left femur bone density was normal.   Unfortunately, CT imaging in January 24 revealed a new nodule in the right middle love measuring 7 mm, with a persistent right pleural effusion, as well as 3 new hypoenhancing liver lesions consistent with metastasis, the largest measuring 18 mm.PET scan revealed widespread metastatic disease with metastatic pulmonary and pleural nodules, metastatic hepatic nodules and bone metastasis.  There was a a sizable right lower lobe/right infrahilar mass felt to be more suggestive of a recurrent lung cancer with associated mediastinal and hilar adenopathy.  She underwent ultrasound-guided liver biopsy in February revealed metastatic carcinoma consistent with breast primary which was estrogen and progesterone receptor positive and HER2 negative.  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   CT imaging in June, 2024 revealed progression of hepatic metastasis with development of innumerable bilobar lesions and enlargement of the previous lesions, the largest now measuring 2.3 x 2.5 cm.  There was enlargement of the right pleural effusion with a new tiny left pleural effusion, with stable right middle lobe pulmonary nodule during 6 mm.  Apparently, the hypermetabolic right infrahilar mass measuring approximately 2.4 cm is not appreciated on CT.  Due to the progressive disease, surgery was discontinued.  She was placed on abemaciclib 100 mg twice daily with fulvestrant injections monthly and continued denosumab injections monthly.  She has tolerated this regimen well.  Oncology History  Malignant neoplasm of upper-outer quadrant of left female breast (HCC)  03/07/2006 Initial Diagnosis   Breast cancer, left breast (HCC)   03/17/2006 Cancer Staging   Staging form: Breast, AJCC 6th Edition - Clinical stage from 03/17/2006: Stage IIIA  (T1c, N2a, M0) - Signed by Dellia Beckwith, MD on 12/15/2020 Staged by: Managing physician Diagnostic confirmation: Positive histology Specimen type: Excision Histopathologic type: Infiltrating duct carcinoma, NOS Tumor size (mm): 15 Laterality: Left Total positive nodes: 5 Histologic grade (G): G1 Residual tumor (R): R0 - None Stage prefix: Initial diagnosis Lymphatic vessel invasion (L): L0 - No lymphatic vessel invasion Venous invasion (V): V0 - No venous invasion Prognostic indicators: ER/PR pos, HER 2 neg., Rx with  AC x 3 mo, then Taxol x 3 mo.,AI x 10 yrs   04/04/2023 - 06/27/2023 Chemotherapy   Patient is on Treatment Plan : BREAST Fulvestrant q28d     Malignant neoplasm metastatic to bone (HCC)  11/08/2022 Initial Diagnosis   Malignant neoplasm metastatic to bone (HCC)   04/04/2023 - 06/27/2023 Chemotherapy   Patient is on Treatment Plan : BREAST Fulvestrant q28d      INTERVAL HISTORY:  Carrie [facility] is here today for repeat clinical assessment of recurrent hormone receptor positive breast cancer metastatic to liver, bone and lung/pleura which was found in December, 2022. She was placed on Anastrozole and Palbociclib in January, 2023 for over 1 year. 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 Repeat scans in June, 2024 once again showed progression so she has been changed to faslodex injections monthly and Verzenio oral chemotherapy at 100 mg twice daily. She has had significant toxicities despite dose reduction with severe worsening of appetite associated with nausea, weakness, dehydration, and diarrhea. We stopped it and she continues to do poorly with worsening hyponatremia. Patient states that she does not feel well, she feels extremely weak and fatigued and short of breath. Her performance status has declined significantly. Her WBC was 3.9, hemoglobin low at 10.0 but  improved from 9.8, and her platelet count was 320,000 as of 07/09/2023. Her sodium was low at 123, her AST was elevated at 64 prior 41, and her alkaline phosphate was elevated at 185 prior was 135 as of 07/09/2023. Her WBC is 5.1, hemoglobin is improved to 11.3, and platelet count of 427,000 as of today. Today her sodium has dropped even lower to 116 and had a worsening elevation of an AST of 95 and ALT of 51. I informed her that she will need to be admitted into the [facility] until she is stable.  She has not started Afinitor and Aromasin and I recommended she not begin that yet. I reassured her that we will continue to treat her for any complications that come up. She could consider trying the hormonal therapy but we are not sure whether taking the Afinitor will cause enough benefit to be worth it. I do feel the severe hyponatremia is caused by her malignancy and may be difficult to control. We discussed code status and she wishes to be a DNR. In the event of cardiac arrest, she would not wish to be resuscitated. I also discussed the option of Hospice with her and her family and what that entails such as stopping treatment. At this time we will stop her Faslodex injections.  She and her family will think about the option of Hospice. Once she is discharged from the [facility], we will plan close follow-up weekly with labs and possible IV fluids unless she opts for Hospice. She denies signs of infection such as sore throat, sinus drainage, cough, or urinary symptoms.  She denies fevers or recurrent chills. She denies pain. She denies nausea, vomiting, chest pain, dyspnea or cough. Her appetite is not good and her weight has decreased 5 pounds over last 3 weeks . She is accompanied with her husband and daughter at today's visit.   REVIEW OF SYSTEMS:  Review of Systems  Constitutional:  Positive for appetite change ((decreased appetite and bad taste) and fatigue (Mild). Negative for chills, diaphoresis, fever and  unexpected weight change.  HENT:  Negative.  Negative for  hearing loss, lump/mass, mouth sores, nosebleeds, sore throat, tinnitus, trouble swallowing and voice change.   Eyes: Negative.  Negative for eye problems and icterus.  Respiratory:  Positive for shortness of breath (on exertion). Negative for chest tightness, cough, hemoptysis and wheezing.   Cardiovascular: Negative.  Negative for chest pain, leg swelling and palpitations.  Gastrointestinal:  Positive for diarrhea (mild). Negative for abdominal distention, abdominal pain, blood in stool, constipation, nausea, rectal pain and vomiting.  Endocrine: Negative.  Negative for hot flashes.  Genitourinary: Negative.  Negative for bladder incontinence, difficulty urinating, dyspareunia, dysuria, frequency, hematuria, menstrual problem, nocturia, pelvic pain, vaginal bleeding and vaginal discharge.   Musculoskeletal:  Positive for arthralgias (chronic arthritic knee pain). Negative for back pain, flank pain, gait problem, myalgias, neck pain and neck stiffness.  Skin: Negative.  Negative for itching, rash and wound.  Neurological:  Positive for dizziness and extremity weakness. Negative for gait problem, headaches, light-headedness, numbness, seizures and speech difficulty.  Hematological: Negative.  Negative for adenopathy. Does not bruise/bleed easily.  Psychiatric/Behavioral: Negative.  Negative for confusion, decreased concentration, depression, sleep disturbance and suicidal ideas. The patient is not nervous/anxious.     VITALS:  Blood pressure (!) 101/52, pulse (!) 109, temperature 97.6 F (36.4 C), temperature source Oral, resp. rate 20, height 5\' 2"  (1.575 m), SpO2 97%.  Wt Readings from Last 3 Encounters:  07/31/23 137 lb (62.1 kg)  07/09/23 143 lb 11.2 oz (65.2 kg)  07/02/23 145 lb 12.8 oz (66.1 kg)    Body mass index is 26.28 kg/m.  Performance status (ECOG): 1 - Symptomatic but completely ambulatory  PHYSICAL EXAM:  Physical  Exam Vitals and nursing note reviewed. Exam conducted with a chaperone present.  Constitutional:      General: She is not in acute distress.    Appearance: Normal appearance. She is normal weight.  HENT:     Head: Normocephalic and atraumatic.     Right Ear: Tympanic membrane, ear canal and external ear normal.     Left Ear: Tympanic membrane, ear canal and external ear normal.     Nose: Nose normal.     Mouth/Throat:     Mouth: Mucous membranes are moist.     Pharynx: Oropharynx is clear. No oropharyngeal exudate or posterior oropharyngeal erythema.  Eyes:     General: No scleral icterus.    Extraocular Movements: Extraocular movements intact.     Conjunctiva/sclera: Conjunctivae normal.     Pupils: Pupils are equal, round, and reactive to light.  Cardiovascular:     Rate and Rhythm: Normal rate and regular rhythm.     Pulses: Normal pulses.     Heart sounds: Normal heart sounds. No murmur heard.    No friction rub. No gallop.  Pulmonary:     Effort: Pulmonary effort is normal. No respiratory distress.     Breath sounds: Examination of the right-lower field reveals decreased breath sounds. Decreased breath sounds present. No wheezing, rhonchi or rales.  Chest:  Breasts:    Right: No inverted nipple, mass, nipple discharge or skin change.     Left: No inverted nipple, mass, nipple discharge or skin change.  Abdominal:     General: Bowel sounds are normal. There is no distension.     Palpations: Abdomen is soft. There is no hepatomegaly, splenomegaly or mass.     Tenderness: There is no abdominal tenderness.     Hernia: A hernia is present. Hernia is present in the ventral area.     Comments:  Chronic upper abdominal ventral wall hernia.  The liver is mildly enlarged   Musculoskeletal:        General: Normal range of motion.     Cervical back: Normal range of motion and neck supple. No tenderness.     Right lower leg: No edema.     Left lower leg: No edema.     Comments: 1+  lymphedema of the left upper extremity   Lymphadenopathy:     Cervical: No cervical adenopathy.     Right cervical: No superficial, deep or posterior cervical adenopathy.    Left cervical: No superficial, deep or posterior cervical adenopathy.     Upper Body:     Right upper body: No supraclavicular, axillary or pectoral adenopathy.     Left upper body: No supraclavicular, axillary or pectoral adenopathy.     Lower Body: No right inguinal adenopathy. No left inguinal adenopathy.  Skin:    General: Skin is warm and dry.     Coloration: Skin is not jaundiced.     Findings: No rash.  Neurological:     General: No focal deficit present.     Mental Status: She is alert and oriented to person, place, and time. Mental status is at baseline.     Cranial Nerves: No cranial nerve deficit.  Psychiatric:        Mood and Affect: Mood normal.        Behavior: Behavior normal.        Thought Content: Thought content normal.        Judgment: Judgment normal.     LABS:      Latest Ref Rng & Units 07/31/2023   12:00 AM 07/18/2023   12:00 AM 07/09/2023    1:21 PM  CBC  WBC  7.1       7.1     5.1     3.9      Hemoglobin 12.0 - 16.0 12.0 - 16.0 12.7       12.7     11.3     10.0      Hematocrit 36 - 46 36 - 46 38       38     32     29      Platelets 150 - 400 K/uL 150 - 400 K/uL 347       347     427     320         This result is from an external source.   Multiple values from one day are sorted in reverse-chronological order      Latest Ref Rng & Units 07/31/2023   12:00 AM 07/18/2023   12:00 AM 07/09/2023    1:21 PM  CMP  BUN 4 - 21 4 - 21 36       36     12     11      Creatinine 0.5 - 1.1 0.5 - 1.1 1.0       1.0     0.6     0.7      Sodium 137 - 147 137 - 147 141       141     116     123      Potassium 3.5 - 5.1 mEq/L 3.5 - 5.1 mEq/L 4.1       4.1     4.2     4.7      Chloride 99 - 108 99 -  108 102       102     84     89      CO2 13 - 22 13 - 22 33       33      26     30      Calcium 8.7 - 10.7 8.7 - 10.7 10.1       10.1     8.9     9.1      Alkaline Phos 25 - 125 25 - 125 272       272     197     185      AST 13 - 35 13 - 35 149       149     95     64      ALT 7 - 35 U/L 7 - 35 U/L 95       95     51     32         This result is from an external source.   Multiple values from one day are sorted in reverse-chronological order   Component Ref Range & Units 6 d ago (06/13/23) 3 wk ago (05/29/23) 10 mo ago (08/14/22) 1 yr ago (04/03/22) 1 yr ago (02/27/22) 1 yr ago (10/11/21)  CA 27.29 0.0 - 38.6 U/mL 414.5 High  422.5 High  CM 26.0 CM 36.4 CM 40.4 High  CM 123.6 High  CM     Lab Results  Component Value Date   CEA1 1.6 10/11/2021   /  CEA  Date Value Ref Range Status  10/11/2021 1.6 0.0 - 4.7 ng/mL Final    Comment:    (NOTE)                             Nonsmokers          <3.9                             Smokers             <5.6 [employer] Electrochemiluminescence Immunoassay (ECLIA) Values obtained with different assay methods or kits cannot be used interchangeably.  Results cannot be interpreted as absolute evidence of the presence or absence of malignant disease. Performed At: [facility] [address] 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  Report date is 11/29/21.   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   Mixed dyslipidemia 11/03/2018   Numbness in right leg    Hx: of   Obesity (BMI 30.0-34.9) 09/21/2020   Other specified disorders of bone density and structure, multiple sites 09/20/2020   Paroxysmal atrial fibrillation (HCC) 02/13/2016   Personal history of COVID-19 09/2019   Pleural effusion, 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   Pneumonia    Hx: of 'a long time ago"   Postoperative examination 02/05/2016    Past Surgical History:  Procedure Laterality Date   APPENDECTOMY     BACK SURGERY     BREAST SURGERY     Hx: of lumpectomy left breast 2007   COLONOSCOPY     Hx: of   DILATION AND CURETTAGE OF UTERUS     TONSILLECTOMY     TOTAL HIP ARTHROPLASTY Right 04/13/2013   Dr Magnus Ivan   TOTAL HIP ARTHROPLASTY Right 04/13/2013   Procedure: RIGHT TOTAL HIP ARTHROPLASTY ANTERIOR APPROACH;  Surgeon: Kathryne Hitch, MD;  Location: [facility] OR;  Service: Orthopedics;  Laterality:  Right;   TUBAL LIGATION      Family History  Problem Relation Age of Onset   Hypertension Sister    Cancer Sister 68       uterine or ovarian   Bone cancer Sister 30   Breast cancer Sister        dx 17 and 57   Uterine cancer Sister 84   Colon cancer Sister 8   Breast cancer Sister 52   Hypertension Brother    Kidney cancer Brother 59   Cancer Brother 40       unknown type; mets   Liver cancer Brother    Liver cancer Brother 46   Pancreatic cancer Niece 80   Breast cancer Niece 61   Colon cancer Nephew 57       mets    Social History:  reports that she has never smoked. She has never used smokeless tobacco. She reports that she does not drink alcohol and does not use drugs.The patient is alone today.  Allergies:  Allergies  Allergen Reactions   Codeine     unknown    Sulfa Antibiotics     unknown   Sulfacetamide Sodium Other (See Comments)    unknown   Chlorhexidine Rash    Current Medications: Current Outpatient Medications  Medication Sig Dispense Refill   apixaban (ELIQUIS) 5 MG TABS tablet TAKE 1 TABLET BY MOUTH TWICE A DAY 180 tablet 1   atorvastatin (LIPITOR) 10 MG tablet Take 1 tablet by mouth every other day.  4   calcium carbonate (OSCAL) 1500 (600 Ca) MG TABS tablet Take 600 mg of elemental calcium by mouth daily with breakfast.     cholecalciferol (VITAMIN D) 1000 UNITS tablet Take 1,000 Units by mouth daily.     denosumab (XGEVA) 120 MG/1.7ML SOLN injection Inject 120 mg into the skin as  directed. Once every 4 weeks     dexamethasone (DECADRON) 0.5 MG/5ML solution Take 5 mLs (0.5 mg total) by mouth 2 (two) times daily. Swish for 2 minutes and spit at least twice daily. May increase up to 4 times daily if needed. (Patient not taking: Reported on 07/31/2023) 100 mL 0   everolimus (AFINITOR) 5 MG tablet Take 1 tablet (5 mg total) by mouth daily. (Patient not taking: Reported on 07/31/2023) 30 tablet 5   exemestane (AROMASIN) 25 MG tablet Take 1 tablet (25  mg total) by mouth daily after breakfast. (Patient not taking: Reported on 07/31/2023) 30 tablet 5   Flaxseed, Linseed, OIL Take 1 capsule by mouth daily.     glucosamine-chondroitin 500-400 MG tablet Take 1 tablet by mouth 2 (two) times daily.      levothyroxine (SYNTHROID, LEVOTHROID) 75 MCG tablet Take 75 mcg by mouth daily.   7   metoprolol tartrate (LOPRESSOR) 25 MG tablet Take 1 tablet (25 mg total) by mouth 2 (two) times daily. 180 tablet 2   Multiple Vitamin (MULTIVITAMIN WITH MINERALS) TABS Take 1 tablet by mouth daily.     ondansetron (ZOFRAN) 4 MG tablet Take 1 tablet (4 mg total) by mouth every 4 (four) hours as needed for nausea or vomiting. 30 tablet 5   prochlorperazine (COMPAZINE) 10 MG tablet Take 1 tablet (10 mg total) by mouth every 6 (six) hours as needed for nausea or vomiting. 30 tablet 5   No current facility-administered medications for this visit.     I,Jasmine M Lassiter,acting as a scribe for Dellia Beckwith, MD.,have documented all relevant documentation on the behalf of Dellia Beckwith, MD,as directed by  Dellia Beckwith, MD while in the presence of Dellia Beckwith, MD.

## 2023-07-18 ENCOUNTER — Encounter: Payer: Self-pay | Admitting: Oncology

## 2023-07-18 ENCOUNTER — Inpatient Hospital Stay: Payer: Medicare HMO

## 2023-07-18 ENCOUNTER — Inpatient Hospital Stay (INDEPENDENT_AMBULATORY_CARE_PROVIDER_SITE_OTHER): Payer: Medicare HMO | Admitting: Oncology

## 2023-07-18 VITALS — BP 101/52 | HR 109 | Temp 97.6°F | Resp 20 | Ht 62.0 in

## 2023-07-18 DIAGNOSIS — E039 Hypothyroidism, unspecified: Secondary | ICD-10-CM | POA: Diagnosis not present

## 2023-07-18 DIAGNOSIS — E222 Syndrome of inappropriate secretion of antidiuretic hormone: Secondary | ICD-10-CM | POA: Diagnosis not present

## 2023-07-18 DIAGNOSIS — J984 Other disorders of lung: Secondary | ICD-10-CM | POA: Diagnosis not present

## 2023-07-18 DIAGNOSIS — Z882 Allergy status to sulfonamides status: Secondary | ICD-10-CM | POA: Diagnosis not present

## 2023-07-18 DIAGNOSIS — Z885 Allergy status to narcotic agent status: Secondary | ICD-10-CM | POA: Diagnosis not present

## 2023-07-18 DIAGNOSIS — M810 Age-related osteoporosis without current pathological fracture: Secondary | ICD-10-CM | POA: Diagnosis not present

## 2023-07-18 DIAGNOSIS — J90 Pleural effusion, not elsewhere classified: Secondary | ICD-10-CM | POA: Diagnosis not present

## 2023-07-18 DIAGNOSIS — C50412 Malignant neoplasm of upper-outer quadrant of left female breast: Secondary | ICD-10-CM

## 2023-07-18 DIAGNOSIS — Z170 Estrogen receptor positive status [ER+]: Secondary | ICD-10-CM | POA: Diagnosis not present

## 2023-07-18 DIAGNOSIS — D6481 Anemia due to antineoplastic chemotherapy: Secondary | ICD-10-CM | POA: Diagnosis not present

## 2023-07-18 DIAGNOSIS — C7800 Secondary malignant neoplasm of unspecified lung: Secondary | ICD-10-CM | POA: Diagnosis not present

## 2023-07-18 DIAGNOSIS — C787 Secondary malignant neoplasm of liver and intrahepatic bile duct: Secondary | ICD-10-CM

## 2023-07-18 DIAGNOSIS — E871 Hypo-osmolality and hyponatremia: Secondary | ICD-10-CM | POA: Diagnosis not present

## 2023-07-18 DIAGNOSIS — C50919 Malignant neoplasm of unspecified site of unspecified female breast: Secondary | ICD-10-CM | POA: Diagnosis not present

## 2023-07-18 DIAGNOSIS — Z66 Do not resuscitate: Secondary | ICD-10-CM | POA: Diagnosis not present

## 2023-07-18 DIAGNOSIS — C7951 Secondary malignant neoplasm of bone: Secondary | ICD-10-CM

## 2023-07-18 DIAGNOSIS — I10 Essential (primary) hypertension: Secondary | ICD-10-CM | POA: Diagnosis not present

## 2023-07-18 DIAGNOSIS — I4891 Unspecified atrial fibrillation: Secondary | ICD-10-CM | POA: Diagnosis not present

## 2023-07-18 DIAGNOSIS — J9811 Atelectasis: Secondary | ICD-10-CM | POA: Diagnosis not present

## 2023-07-18 DIAGNOSIS — T451X5A Adverse effect of antineoplastic and immunosuppressive drugs, initial encounter: Secondary | ICD-10-CM | POA: Diagnosis not present

## 2023-07-18 DIAGNOSIS — Z79899 Other long term (current) drug therapy: Secondary | ICD-10-CM | POA: Diagnosis not present

## 2023-07-18 DIAGNOSIS — Z888 Allergy status to other drugs, medicaments and biological substances status: Secondary | ICD-10-CM | POA: Diagnosis not present

## 2023-07-18 DIAGNOSIS — J910 Malignant pleural effusion: Secondary | ICD-10-CM | POA: Diagnosis not present

## 2023-07-18 DIAGNOSIS — D649 Anemia, unspecified: Secondary | ICD-10-CM | POA: Diagnosis not present

## 2023-07-18 DIAGNOSIS — Z7901 Long term (current) use of anticoagulants: Secondary | ICD-10-CM | POA: Diagnosis not present

## 2023-07-18 DIAGNOSIS — Z9889 Other specified postprocedural states: Secondary | ICD-10-CM | POA: Diagnosis not present

## 2023-07-18 DIAGNOSIS — Z23 Encounter for immunization: Secondary | ICD-10-CM | POA: Diagnosis not present

## 2023-07-18 DIAGNOSIS — Z853 Personal history of malignant neoplasm of breast: Secondary | ICD-10-CM | POA: Diagnosis not present

## 2023-07-18 DIAGNOSIS — E785 Hyperlipidemia, unspecified: Secondary | ICD-10-CM | POA: Diagnosis not present

## 2023-07-18 LAB — HEPATIC FUNCTION PANEL
ALT: 51 U/L — AB (ref 7–35)
AST: 95 — AB (ref 13–35)
Alkaline Phosphatase: 197 — AB (ref 25–125)
Bilirubin, Total: 0.8

## 2023-07-18 LAB — COMPREHENSIVE METABOLIC PANEL
Albumin: 3.6 (ref 3.5–5.0)
Calcium: 8.9 (ref 8.7–10.7)

## 2023-07-18 LAB — BASIC METABOLIC PANEL
BUN: 12 (ref 4–21)
CO2: 26 — AB (ref 13–22)
Chloride: 84 — AB (ref 99–108)
Creatinine: 0.6 (ref 0.5–1.1)
Glucose: 128
Potassium: 4.2 meq/L (ref 3.5–5.1)
Sodium: 116 — AB (ref 137–147)

## 2023-07-18 LAB — CBC AND DIFFERENTIAL
HCT: 32 — AB (ref 36–46)
Hemoglobin: 11.3 — AB (ref 12.0–16.0)
Neutrophils Absolute: 3.77
Platelets: 427 10*3/uL — AB (ref 150–400)
WBC: 5.1

## 2023-07-18 LAB — CBC: RBC: 3.34 — AB (ref 3.87–5.11)

## 2023-07-18 NOTE — Progress Notes (Signed)
Patient direct admitted to [facility] room 439. Transported via wheelchair.

## 2023-07-18 NOTE — Progress Notes (Signed)
CRITICAL VALUE STICKER  CRITICAL VALUE:  N+ [phone]  RECEIVER (on-site recipient of call):  Dyane Dustman RN  DATE & TIME NOTIFIED:   07/18/2023 @ 1130  MESSENGER (representative from lab):  Toniann Fail [facility] lab  MD NOTIFIED:   Dr. Gilman Buttner  TIME OF NOTIFICATION:  1131  RESPONSE:  Patient here for appointment, direct admit to [facility].

## 2023-07-19 LAB — CANCER ANTIGEN 27.29: CA 27.29: 792.1 U/mL — ABNORMAL HIGH (ref 0.0–38.6)

## 2023-07-20 ENCOUNTER — Encounter: Payer: Self-pay | Admitting: Oncology

## 2023-07-20 NOTE — Progress Notes (Signed)
[facility]  [address] 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  She had progressive disease on imaging in June while on letrozole and palbociclib.  She was then on abemaciclib 100 mg twice daily and fulvestrant monthly.  She has been having difficulty tolerating this due to taste changes, decreased appetite, weight loss, hyponatremia.  Her dose of abemaciclib was reduced to 100 mg once a day 3 weeks ago.  She received fulvestrant 2 weeks ago but we will stop it now. Repeat CT imaging revealed some progression of dominant liver metastasis, was a new 3 mm right lower lobe nodule, exam was otherwise stable.  She continues to report decreased appetite and changes.  She has lost some more weight.  She has mild epigastric tenderness, which may be due to gastritis. We have stopped the abemciclib, and we will now stop the Faslodex.   Malignant neoplasm metastatic to bone [facility]) Bone metastasis diagnosed by PET in January, not seen on CT imaging. Her calcium was 10.5 last week and she received her monthly denosumab last week.  Calcium is in normal range today.   Hyponatremia New onset hyponatremia. She does not have evidence of dehydration or fluid overload. Serum osmolality was low. Urine osmolality was normal.  She is not on for thiazide diuretics or other medications known to cause hyponatremia.  She received IV normal saline last week and sodium is lower. CT chest, abdomen and pelvis reveals slight progression of her dominant hepatic metastasis with a new 3 mm right lower lobe pulmonary nodule. Will hold off on additional IV fluids and evaluate for other causes of hyponatremia.   Anemia New anemia most likely due  to abemaciclib, now stable at 10.0.       Plan: Her daughter is here today and we have had a frank discussion about her poor prognosis and the fact that she has not responded to the last 2 therapies.  She had AC followed by Taxol  originally, followed by 10 years of letrozole.  When she recurred in the pleura with a large pleural effusion, she was placed on palbociclib and anastrozole, which helped for a year. 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 She does not wish to pursue aggressive IV chemotherapy but I offered the option of everolimus and exemestane and she would like to try that.  I reviewed the schedule and potential toxicities with them and will send in a prescription for the hormonal therapy.  I will order the everolimus and have her meet with Yvonna Alanis, the pharmacist to review the information in more detail. She reports occasional shortness of breath with exertion.  She denies chest pain or palpitations.  She reports occasional nausea for which ondansetron is effective.  She denies fevers or chills. She reports epigastric tenderness pain. Her weight has decreased 2 pounds over last 1 week.  CT chest, abdomen and pelvis on September 20 revealed a slight increase in the dominant hepatic metastasis in the right lobe.  The other small hepatic metastasis were stable.  There was a slight increase in a right lower lobe pulmonary nodule now measuring 3  mm with a stable right middle lobe nodule measuring 4 mm.  Bilateral pleural effusions were stable. Hemoglobin is 10 today with normal WBC and platelets. 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 SGOT is slightly worse at 64 but the rest of her CMP is unremarkable. The patient and her daughter understand the plans discussed today and are in agreement with them.  They know to contact our office  if she develops concerns prior to her next appointment.   I provided 30 minutes of face-to-face time during this encounter and > 50% was spent counseling as documented under my assessment and plan.    Dellia Beckwith, MD  [facility] AT [facility] [address] [place] [address] Dept: [phone] Dept Fax: [phone]   No orders of the defined types were placed in this encounter.     CHIEF COMPLAINT:  CC: Recurrent hormone receptor positive breast cancer  Current Treatment: Verzenio 100 mg daily/Faslodex every 4 weeks/Xgeva every 4 weeks  HISTORY OF PRESENT ILLNESS:  Celes Dedic is a 82 y.o. 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 She was treated with lumpectomy.  Pathology revealed a 1.5 cm, grade 1, invasive ductal carcinoma with 5 positive nodes.  Estrogen and progesterone receptors were positive and Her-2 Neu negative.  She received adjuvant chemotherapy with Adriamycin/Cytoxan for 3 months, followed by Taxol for 3 months.  She then received adjuvant radiation to the left breast.  She was placed on letrozole in April 2008 and completed 10 years of adjuvant hormonal therapy in April 2018.  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   Repeat bone density in September 2017 revealed slight improvement in the bone density, with a T-score of -2.1 in the forearm and a T-score of -1.1 in the femur with, so she has continued denosumab every 6 months, in addition to calcium/vitamin D.  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    She had reversal of her colostomy in October 2017.  She has an incisional hernia in the abdomen, which does not bother her, so she has  decided against surgical repair.  She has atrial fibrillation and is on apixaban 5 mg twice daily.  Bone density scan in September 2019 revealed improvement in the bone density with a T-score of -1.6 in forearm.  The bone density of the femur had normalized with a T-score of -0.8.  She has continued denosumab every 6 months.  Annual bilateral mammogram in December 2020 did not reveal any evidence of malignancy.  She contracted COVID-19 later in December 2020.  She did not have to be hospitalized and fully recovered.  Bone density from September 2021 revealed mildly worsened osteopenia with a T-score of -2.0 of the left forearm radius, previously -1.6.  Left femur neck is normal at -0.9, previously -0.8.  She continues calcium 1200 mg with vitamin-D daily.   She presented to the emergency department in December 2022 due to shortness of breath and left substernal area pain. ECHO revealed a normal EF between 60-65%. CT chest revealed right middle and lower lobe collapse with large right pleural effusion. There was no central obstructing mass lesion evident, or obvious right-sided  pleural disease, although there is a irregular focus of soft tissue attenuation along the medial right upper lobe pleura/right anterior mediastinum. There were scattered tiny bilateral pulmonary nodules measuring up to 4 mm. Thoracentesis was pursued yielding 1200 cc's of pleural fluid. Cytopathology confirmed malignant cells consistent with breast primary. CKAE1/3, GATA-3 and ER positive. HER2 was negative 1+. Estrogen receptor was positive at 90% and progesterone receptor was positive at 20%. Ki67 was <1%. MRI brain was negative for evidence of metastasis. PET imaging in January 2023 revealed no hypermetabolic activity to localize active metastatic breast carcinoma. Large right pleural effusion occupies 75% of the right hemithorax. Along the anteromedial margin of the right diaphragm there is hypermetabolic linear thickening of the  diaphragm. She had a 2nd thoracentesis in January.  She was placed on anastrozole and palbociclib 125 mg daily for 3 weeks on and 1 week off.  She did not required further thoracentesis.  The dose of palbociclib was reduced to 100 mg daily with the 3rd cycle due to neutropenia.  The CA 27-29 had decreased from 123.6 to 40.4.  The dose of palbociclib was decreased again to 75 mg daily for 3 weeks on and 1 week off with her 5th cycle due to neutropenia.    CT imaging in July 2023 revealed a decrease in the right pleural effusion and atelectasis without new or progressive disease.  She continued anastrozole/palbociclib which was eventually switched to 3 weeks on and 2 weeks off due to cytopenias.  Bone density scan in December 2023 revealed slightly worsened osteopenia with a T-score of -2.2 in the left forearm, previously -2.0.  Left femur bone density was normal.   Unfortunately, CT imaging in January revealed a new nodule in the right middle love measuring 7 mm, with a persistent right pleural effusion, as well as 3 new hypoenhancing liver lesions consistent with metastasis, the largest measuring 18 mm.PET scan revealed widespread metastatic disease with metastatic pulmonary and pleural nodules, metastatic hepatic nodules and bone metastasis.  There was a a sizable right lower lobe/right infrahilar mass felt to be more suggestive of a recurrent lung cancer with associated mediastinal and hilar adenopathy.  Ultrasound-guided liver biopsy in February revealed metastatic carcinoma consistent with breast primary which was estrogen and progesterone receptor positive and HER2 negative.  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   CT imaging in June 2024 revealed progression of hepatic metastasis with development of innumerable bilobar lesions and enlargement of the previous lesions, the largest now measuring 2.3 x 2.5 cm.  There was  enlargement of the right pleural effusion with a new tiny left pleural effusion, with stable right middle lobe pulmonary nodule during 6 mm.  Apparently, the hypermetabolic right infrahilar mass measuring approximately 2.4 cm is not appreciated on CT.  Due to the progressive disease, Orserdu was discontinued.  She was placed on abemaciclib 100 mg twice daily with fulvestrant injections monthly and continued denosumab injections monthly.  She had been tolerating this regimen well.  CA 27-29 had increased to 422 in August, but we had not checked it since November.  The CA 27-29 was down slightly to 414 on September 6.  Her sodium was also low at 128.  She was not eating and was felt to be dehydrated, so she received 1 L of normal saline and dexamethasone 8 mg.  Her sodium was even lower on September 13 and she received IV fluids and dexamethasone  again.  Urine osmolality was normal.  As she continued to do poorly despite reducing the dose of abemaciclib, I repeated CT chest, abdomen and pelvis.  Oncology History  Malignant neoplasm of upper-outer quadrant of left female breast (HCC)  03/07/2006 Initial Diagnosis   Breast cancer, left breast (HCC)   03/17/2006 Cancer Staging   Staging form: Breast, AJCC 6th Edition - Clinical stage from 03/17/2006: Stage IIIA (T1c, N2a, M0) - Signed by Dellia Beckwith, MD on 12/15/2020 Staged by: Managing physician Diagnostic confirmation: Positive histology Specimen type: Excision Histopathologic type: Infiltrating duct carcinoma, NOS Tumor size (mm): 15 Laterality: Left Total positive nodes: 5 Histologic grade (G): G1 Residual tumor (R): R0 - None Stage prefix: Initial diagnosis Lymphatic vessel invasion (L): L0 - No lymphatic vessel invasion Venous invasion (V): V0 - No venous invasion Prognostic indicators: ER/PR pos, HER 2 neg., Rx with AC x 3 mo, then Taxol x 3 mo.,AI x 10 yrs   04/04/2023 -  Chemotherapy   Patient is on Treatment Plan : BREAST  Fulvestrant q28d     Malignant neoplasm metastatic to bone (HCC)  11/08/2022 Initial Diagnosis   Malignant neoplasm metastatic to bone (HCC)   04/04/2023 -  Chemotherapy   Patient is on Treatment Plan : BREAST Fulvestrant q28d         INTERVAL HISTORY:  Gracious is here today for continued supportive care of recurrent breast cancer, metastatic to pleura, bone and now liver.  Her appetite remains decreased and nothing tastes good.  We had stopped the Verzenio last month and decreased the Faslodex dose, so we will stop that also now. She is not eating much, and has restricted her fluids, mainly drinking Pedialyte and Ensure.  She has had problems with hyponatremia and her sodium is a little worse today at 123.  Her daughter is here today and we have had a frank discussion about her poor prognosis and the fact that she has not responded to the last 2 therapies.  She had AC followed by Taxol  originally, followed by 10 years of letrozole.  When she recurred in the pleura with a large pleural effusion, she was placed on palbociclib and anastrozole, which helped for a year. 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 She does not wish to pursue aggressive IV chemotherapy but I offered the option of everolimus and exemestane and she would like to try that.  I reviewed the schedule and potential toxicities with them and will send in a prescription for the hormonal therapy.  I will order the everolimus and have her meet with Yvonna Alanis, the pharmacist to review the information in more detail. She reports occasional shortness of breath with exertion.  She denies chest pain or palpitations.  She reports occasional nausea for which ondansetron is effective.  She denies fevers or chills. She reports epigastric tenderness pain. Her weight has decreased 2 pounds over last 1 week .  CT chest,  abdomen and pelvis on September 20 revealed a slight increase in the dominant hepatic metastasis in the right lobe.  The other small hepatic metastasis were stable.  There was a slight increase in a right lower lobe pulmonary nodule now measuring 3 mm with a stable right middle lobe nodule measuring 4 mm.  Bilateral pleural effusions were stable. Hemoglobin is 10 today with normal WBC and platelets. 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 SGOT is slightly worse at 64 but the rest of her CMP is unremarkable.   REVIEW OF SYSTEMS:  Review of Systems  Constitutional:  Positive for appetite change, fatigue and unexpected weight change. Negative for chills and fever.  HENT:   Negative for lump/mass, mouth sores and sore throat.   Respiratory:  Positive for shortness of breath. Negative for cough.   Cardiovascular:  Negative for chest pain and leg swelling.  Gastrointestinal:  Positive for abdominal pain. Negative for constipation, diarrhea, nausea and vomiting.  Endocrine: Negative for hot flashes.  Genitourinary:  Negative for difficulty urinating, dysuria, frequency and hematuria.   Musculoskeletal:  Negative for arthralgias, back pain and myalgias.  Skin:  Negative for rash.  Neurological:  Negative for dizziness and headaches.  Hematological:  Negative for adenopathy. Does not bruise/bleed easily.  Psychiatric/Behavioral:  Negative for depression and sleep disturbance. The patient is not nervous/anxious.      VITALS:  Blood pressure (!) 109/55, pulse 73, temperature 98 F (36.7 C), temperature source Oral, resp. rate 18, height 5\' 2"  (1.575 m), weight 143 lb 11.2 oz (65.2 kg), SpO2 98%.  Wt Readings from Last 3 Encounters:  07/09/23 143 lb 11.2 oz (65.2 kg)  07/02/23 145 lb 12.8 oz (66.1 kg)  06/27/23 148 lb 1.9 oz (67.2 kg)    Body mass index is 26.28 kg/m.  Performance status (ECOG): 2 - Symptomatic, <50% confined to bed  PHYSICAL EXAM:  Physical  Exam Vitals and nursing note reviewed.  Constitutional:      General: She is not in acute distress.    Appearance: Normal appearance.  HENT:     Head: Normocephalic and atraumatic.     Mouth/Throat:     Mouth: Mucous membranes are moist.     Pharynx: Oropharynx is clear. No oropharyngeal exudate or posterior oropharyngeal erythema.  Eyes:     General: No scleral icterus.    Extraocular Movements: Extraocular movements intact.     Conjunctiva/sclera: Conjunctivae normal.     Pupils: Pupils are equal, round, and reactive to light.  Cardiovascular:     Rate and Rhythm: Normal rate and regular rhythm.     Heart sounds: Normal heart sounds. No murmur heard.    No friction rub. No gallop.  Pulmonary:     Effort: Pulmonary effort is normal.     Breath sounds: Examination of the right-lower field reveals decreased breath sounds. Decreased breath sounds present. No wheezing, rhonchi or rales.  Abdominal:     General: There is no distension.     Palpations: Abdomen is soft. There is no mass.     Tenderness: There is abdominal tenderness in the epigastric area.  Musculoskeletal:        General: Normal range of motion.     Cervical back: Normal range of motion and neck supple. No tenderness.     Right lower leg: No edema.     Left lower leg: No edema.  Lymphadenopathy:     Cervical: No cervical adenopathy.  Skin:    General: Skin is warm and dry.     Coloration: Skin is not jaundiced.     Findings: No rash.  Neurological:     Mental Status: She is alert and oriented to person, place, and time.     Cranial Nerves: No cranial nerve deficit.  Psychiatric:        Mood and Affect: Mood normal.  Behavior: Behavior normal.        Thought Content: Thought content normal.    LABS:      Latest Ref Rng & Units 07/18/2023   12:00 AM 07/09/2023    1:21 PM 07/02/2023   12:00 AM  CBC  WBC  5.1     3.9     3.9      Hemoglobin 12.0 - 16.0 11.3     10.0     9.8      Hematocrit 36 - 46  32     29     28      Platelets 150 - 400 K/uL 427     320     299         This result is from an external source.      Latest Ref Rng & Units 07/18/2023   12:00 AM 07/09/2023    1:21 PM 07/02/2023   12:00 AM  CMP  BUN 4 - 21 12     11     9       Creatinine 0.5 - 1.1 0.6     0.7     0.7      Sodium 137 - 147 116     123     124      Potassium 3.5 - 5.1 mEq/L 4.2     4.7     4.2      Chloride 99 - 108 84     89     90      CO2 13 - 22 26     30     29       Calcium 8.7 - 10.7 8.9     9.1     9.4      Alkaline Phos 25 - 125 197     185     135      AST 13 - 35 95     64     41      ALT 7 - 35 U/L 51     32     20         This result is from an external source.     Lab Results  Component Value Date   CEA1 1.6 10/11/2021   /  CEA  Date Value Ref Range Status  10/11/2021 1.6 0.0 - 4.7 ng/mL Final    Comment:    (NOTE)                             Nonsmokers          <3.9                             Smokers             <5.6 [employer] Electrochemiluminescence Immunoassay (ECLIA) Values obtained with different assay methods or kits cannot be used interchangeably.  Results cannot be interpreted as absolute evidence of the presence or absence of malignant disease. Performed At: [facility] [address] [place], [place] [id number] Jolene Schimke MD WU:[id number]    No results found for: "PSA1" No results found for: "CAN199" No results found for: "CAN125"  No results found for: "TOTALPROTELP", "ALBUMINELP", "A1GS", "A2GS", "BETS", "BETA2SER", "GAMS", "MSPIKE", "SPEI" Lab Results  Component Value Date   FERRITIN 387 (H) 07/02/2023   No results found  for: "LDH"  STUDIES:    Exam(s): 2956-2130 CT/CT CHEST-ABD-PELV W/IV CM  CLINICAL DATA: Follow-up metastatic breast carcinoma. Restaging.  Diverticulitis. * Tracking Code: BO *  EXAM:  CT CHEST, ABDOMEN, AND PELVIS WITH CONTRAST  TECHNIQUE:  Multidetector CT imaging of the chest, abdomen and pelvis was   performed following the standard protocol during bolus  administration of intravenous contrast.  RADIATION DOSE REDUCTION: This exam was performed according to the  departmental dose-optimization program which includes automated  exposure control, adjustment of the mA and/or kV according to  patient size and/or use of iterative reconstruction technique.  CONTRAST: 80 mL Isovue 370  COMPARISON: 03/31/2023  FINDINGS:  CT CHEST FINDINGS  Cardiovascular: No acute findings.  Mediastinum/Lymph Nodes: No pathologically enlarged lymph nodes  identified. Stable 9 mm right thyroid lobe nodule.  Lungs/Pleura: Stable large right pleural effusion and tiny left  pleural effusion. Stable compressive atelectasis in right middle and  lower lobes. 4 mm right middle lobe pulmonary nodule on image 70/301  remains stable. A 3 mm right lower lobe pulmonary nodule on image  90/301 shows mild increase in size since previous study.  Musculoskeletal: No suspicious bone lesions identified.  CT ABDOMEN AND PELVIS FINDINGS  Hepatobiliary: Multiple small low-attenuation masses are seen  throughout the liver. Most of these are stable,, however, dominant  mass in the right hepatic lobe currently measures 2.9 x 2.6 cm on  image 75/2, compared to 2.4 x 2.3 cm previously. Tiny calcified  gallstone noted, however, there is no evidence of cholecystitis or  biliary ductal dilatation.  Pancreas: No mass or inflammatory changes.  Spleen: Within normal limits in size. Stable small splenic cyst  noted.  Adrenals/Urinary tract: No suspicious masses or hydronephrosis.  Stomach/Bowel: No evidence of obstruction, inflammatory process, or  abnormal fluid collections.  Vascular/Lymphatic: No pathologically enlarged lymph nodes  identified. No acute vascular findings.  Reproductive: Stable posterior uterine fibroid with central  calcification measuring approximately 4 cm in diameter. No other  mass or free fluid identified.   Other: Rectus diastasis noted. A small left lower quadrant ventral  hernia is again seen which contains only fat.  Musculoskeletal: No suspicious bone lesions identified. Right hip  prosthesis noted, as well as severe left hip osteoarthritis  IMPRESSION:  Mild increase in size of dominant hepatic metastasis in right lobe.  Other diffuse small hepatic metastases show no significant change.  Increased 3 mm right lower lobe pulmonary nodule and stable 4 mm  right middle lobe nodule. Pulmonary metastases cannot be excluded.  Stable large right pleural effusion and tiny left pleural effusion.  Stable 4 cm uterine fibroid.  Cholelithiasis. No radiographic evidence of cholecystitis.    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  Report date is 11/29/21.   The CustomNext-Cancer+RNAinsight panel offered by [employer] includes sequencing and rearrangement analysis for the following 47 genes:  APC, ATM, AXIN2, BARD1,  BMPR1A, BRCA1, BRCA2, BRIP1, CDH1, CDK4, CDKN2A, CHEK2, DICER1, EPCAM, GREM1, HOXB13,    History of left breast cancer 06/13/2021   Hypotension 06/13/2021   Hypothyroidism (acquired) 03/18/2023   Incisional hernia, without obstruction or gangrene 10/22/2016   Lymphedema of left upper extremity 06/12/2022   Malignant neoplasm metastatic to bone (HCC) 11/08/2022   Malignant neoplasm  metastatic to liver (HCC) 11/08/2022   Malignant neoplasm of upper-outer quadrant of left female breast (HCC) 03/07/2006   Mixed dyslipidemia 11/03/2018   Numbness in right leg    Hx: of   Obesity (BMI 30.0-34.9) 09/21/2020   Other specified disorders of bone density and structure, multiple sites 09/20/2020   Paroxysmal atrial fibrillation (HCC) 02/13/2016   Personal history of COVID-19 09/2019   Pleural effusion, 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   Pneumonia    Hx: of 'a long time ago"   Postoperative examination 02/05/2016    Past Surgical History:  Procedure Laterality Date   APPENDECTOMY     BACK SURGERY     BREAST SURGERY     Hx: of lumpectomy left breast 2007   COLONOSCOPY     Hx: of   DILATION AND CURETTAGE OF UTERUS     TONSILLECTOMY     TOTAL HIP ARTHROPLASTY Right 04/13/2013   Dr Magnus Ivan   TOTAL HIP ARTHROPLASTY Right 04/13/2013   Procedure: RIGHT TOTAL HIP ARTHROPLASTY ANTERIOR APPROACH;  Surgeon: Kathryne Hitch, MD;  Location: [facility] OR;  Service: Orthopedics;  Laterality: Right;   TUBAL LIGATION      Family History  Problem Relation Age of Onset   Hypertension Sister    Cancer Sister 58       uterine or ovarian   Bone cancer Sister 27   Breast cancer Sister        dx 73 and 34   Uterine cancer Sister 33   Colon cancer Sister 73   Breast cancer Sister 89   Hypertension Brother    Kidney cancer Brother 40   Cancer Brother 41       unknown type; mets   Liver cancer Brother    Liver cancer Brother 34   Pancreatic cancer Niece 21   Breast cancer Niece 63   Colon cancer Nephew 57       mets    Social History:  reports that she has never smoked. She has never used smokeless tobacco. She reports that she does not drink alcohol and does not use drugs.The patient is alone today.  Allergies:  Allergies  Allergen Reactions   Codeine     unknown    Sulfa Antibiotics     unknown   Sulfacetamide Sodium Other (See Comments)    unknown   Chlorhexidine Rash     Current Medications: Current Outpatient Medications  Medication Sig Dispense Refill   apixaban (ELIQUIS) 5 MG TABS tablet TAKE 1 TABLET BY MOUTH TWICE A DAY 180 tablet 1   atorvastatin (LIPITOR) 10 MG tablet Take 1 tablet by mouth every other day.  4   calcium carbonate (OSCAL) 1500 (600 Ca) MG TABS tablet Take 600 mg of elemental calcium by mouth daily with breakfast.     cholecalciferol (VITAMIN D) 1000 UNITS tablet Take 1,000 Units by mouth daily.     denosumab (XGEVA) 120 MG/1.7ML SOLN injection Inject 120 mg into the skin as directed. Once every 4 weeks     dexamethasone (DECADRON) 0.5 MG/5ML solution Take 5 mLs (0.5 mg total) by mouth 2 (two) times daily. Swish for 2 minutes  and spit at least twice daily. May increase up to 4 times daily if needed. 100 mL 0   everolimus (AFINITOR) 5 MG tablet Take 1 tablet (5 mg total) by mouth daily. 30 tablet 5   exemestane (AROMASIN) 25 MG tablet Take 1 tablet (25 mg total) by mouth daily after breakfast. 30 tablet 5   Flaxseed, Linseed, OIL Take 1 capsule by mouth daily.     glucosamine-chondroitin 500-400 MG tablet Take 1 tablet by mouth 2 (two) times daily.      levothyroxine (SYNTHROID, LEVOTHROID) 75 MCG tablet Take 75 mcg by mouth daily.   7   metoprolol tartrate (LOPRESSOR) 25 MG tablet Take 1 tablet (25 mg total) by mouth 2 (two) times daily. 180 tablet 2   Multiple Vitamin (MULTIVITAMIN WITH MINERALS) TABS Take 1 tablet by mouth daily.     ondansetron (ZOFRAN) 4 MG tablet Take 1 tablet (4 mg total) by mouth every 4 (four) hours as needed for nausea or vomiting. 30 tablet 5   prochlorperazine (COMPAZINE) 10 MG tablet Take 1 tablet (10 mg total) by mouth every 6 (six) hours as needed for nausea or vomiting. 30 tablet 5   No current facility-administered medications for this visit.

## 2023-07-23 ENCOUNTER — Encounter: Payer: Self-pay | Admitting: Oncology

## 2023-07-23 ENCOUNTER — Other Ambulatory Visit: Payer: Self-pay | Admitting: Pharmacist

## 2023-07-24 ENCOUNTER — Inpatient Hospital Stay: Payer: Medicare HMO

## 2023-07-24 ENCOUNTER — Telehealth: Payer: Self-pay | Admitting: Dietician

## 2023-07-24 ENCOUNTER — Encounter: Payer: Self-pay | Admitting: Oncology

## 2023-07-24 ENCOUNTER — Encounter: Payer: Medicare HMO | Admitting: Dietician

## 2023-07-24 ENCOUNTER — Inpatient Hospital Stay: Payer: Medicare HMO | Admitting: Hematology and Oncology

## 2023-07-24 NOTE — Progress Notes (Deleted)
[facility]  [address] [place],  [place]  [address] [phone]  Clinic Day:  07/24/2023  Referring physician: Philemon Kingdom, MD  ASSESSMENT & PLAN:   Assessment & Plan: No problem-specific Assessment & Plan notes found for this encounter.    The patient understands the plans discussed today and is in agreement with them.  She knows to contact our office if she develops concerns prior to her next appointment.   I provided *** minutes of face-to-face time during this encounter and > 50% was spent counseling as documented under my assessment and plan.    [name], PA-C  [facility] AT [facility] [address] [place] [address] Dept: [phone] Dept Fax: [phone]   No orders of the defined types were placed in this encounter.     CHIEF COMPLAINT:  CC: ***  Current Treatment:  ***  HISTORY OF PRESENT ILLNESS:   Oncology History  Malignant neoplasm of upper-outer quadrant of left female breast (HCC)  03/07/2006 Initial Diagnosis   Breast cancer, left breast (HCC)   03/17/2006 Cancer Staging   Staging form: Breast, AJCC 6th Edition - Clinical stage from 03/17/2006: Stage IIIA (T1c, N2a, M0) - Signed by Dellia Beckwith, MD on 12/15/2020 Staged by: Managing physician Diagnostic confirmation: Positive histology Specimen type: Excision Histopathologic type: Infiltrating duct carcinoma, NOS Tumor size (mm): 15 Laterality: Left Total positive nodes: 5 Histologic grade (G): G1 Residual tumor (R): R0 - None Stage prefix: Initial diagnosis Lymphatic vessel invasion (L): L0 - No lymphatic vessel invasion Venous invasion (V): V0 - No venous invasion Prognostic indicators: ER/PR pos, HER 2 neg., Rx with AC x 3 mo, then Taxol x 3 mo.,AI x 10 yrs   04/04/2023 -  Chemotherapy   Patient is on Treatment Plan : BREAST Fulvestrant q28d     Malignant neoplasm metastatic  to bone (HCC)  11/08/2022 Initial Diagnosis   Malignant neoplasm metastatic to bone (HCC)   04/04/2023 -  Chemotherapy   Patient is on Treatment Plan : BREAST Fulvestrant q28d         INTERVAL HISTORY:  Carrie Wilkerson is here today for repeat clinical assessment. She denies fevers or chills. She denies pain. Her appetite is good. Her weight {Weight change:10426}.  REVIEW OF SYSTEMS:  Review of Systems - Oncology   VITALS:  There were no vitals taken for this visit.  Wt Readings from Last 3 Encounters:  07/09/23 143 lb 11.2 oz (65.2 kg)  07/02/23 145 lb 12.8 oz (66.1 kg)  06/27/23 148 lb 1.9 oz (67.2 kg)    There is no height or weight on file to calculate BMI.  Performance status (ECOG): {CHL ONC [id number]  PHYSICAL EXAM:  Physical Exam  LABS:      Latest Ref Rng & Units 07/18/2023   12:00 AM 07/09/2023    1:21 PM 07/02/2023   12:00 AM  CBC  WBC  5.1     3.9     3.9      Hemoglobin 12.0 - 16.0 11.3     10.0     9.8      Hematocrit 36 - 46 32     29     28      Platelets 150 - 400 K/uL 427     320     299         This result is from an external source.  Latest Ref Rng & Units 07/18/2023   12:00 AM 07/09/2023    1:21 PM 07/02/2023   12:00 AM  CMP  BUN 4 - 21 12     11     9       Creatinine 0.5 - 1.1 0.6     0.7     0.7      Sodium 137 - 147 116     123     124      Potassium 3.5 - 5.1 mEq/L 4.2     4.7     4.2      Chloride 99 - 108 84     89     90      CO2 13 - 22 26     30     29       Calcium 8.7 - 10.7 8.9     9.1     9.4      Alkaline Phos 25 - 125 197     185     135      AST 13 - 35 95     64     41      ALT 7 - 35 U/L 51     32     20         This result is from an external source.     Lab Results  Component Value Date   CEA1 1.6 10/11/2021   /  CEA  Date Value Ref Range Status  10/11/2021 1.6 0.0 - 4.7 ng/mL Final    Comment:    (NOTE)                             Nonsmokers          <3.9                             Smokers              <5.6 Roche [employer] Electrochemiluminescence Immunoassay (ECLIA) Values obtained with different assay methods or kits cannot be used interchangeably.  Results cannot be interpreted as absolute evidence of the presence or absence of malignant disease. Performed At: [facility] [address] [place], [place] [id number] Jolene Schimke MD AO:[id number]    No results found for: "PSA1" No results found for: "CAN199" No results found for: "CAN125"  No results found for: "TOTALPROTELP", "ALBUMINELP", "A1GS", "A2GS", "BETS", "BETA2SER", "GAMS", "MSPIKE", "SPEI" Lab Results  Component Value Date   FERRITIN 387 (H) 07/02/2023   No results found for: "LDH"  STUDIES:  No results found.    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  Report date is 11/29/21.   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) 11/08/2022   Malignant neoplasm of upper-outer quadrant of left female breast (HCC) 03/07/2006   Mixed dyslipidemia 11/03/2018   Numbness in right leg    Hx: of   Obesity (BMI 30.0-34.9) 09/21/2020   Other specified disorders of bone density and structure, multiple sites 09/20/2020   Paroxysmal atrial fibrillation (HCC) 02/13/2016   Personal history of COVID-19 09/2019   Pleural effusion, 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   Pneumonia    Hx: of 'a long time ago"   Postoperative examination 02/05/2016    Past Surgical History:  Procedure Laterality Date   APPENDECTOMY     BACK SURGERY     BREAST SURGERY     Hx: of lumpectomy left breast 2007   COLONOSCOPY     Hx: of   DILATION AND CURETTAGE OF UTERUS     TONSILLECTOMY     TOTAL HIP ARTHROPLASTY Right 04/13/2013   Dr Magnus Ivan   TOTAL HIP ARTHROPLASTY Right 04/13/2013   Procedure: RIGHT TOTAL HIP ARTHROPLASTY ANTERIOR APPROACH;  Surgeon: Kathryne Hitch, MD;  Location: [facility] OR;  Service: Orthopedics;  Laterality: Right;   TUBAL LIGATION      Family History  Problem Relation Age of Onset   Hypertension Sister    Cancer Sister 85       uterine or ovarian   Bone cancer Sister 50   Breast cancer Sister        dx 79 and 72   Uterine cancer Sister 76   Colon cancer Sister 39   Breast cancer Sister 64   Hypertension Brother    Kidney cancer Brother 91   Cancer Brother 7       unknown type; mets   Liver cancer Brother    Liver cancer Brother 23   Pancreatic cancer Niece 76   Breast cancer Niece 60   Colon cancer Nephew 57       mets    Social History:  reports that she has never smoked. She has never used smokeless tobacco. She reports that she does not drink alcohol and does not use drugs.The patient is {Blank single:19197::"alone","accompanied by"} *** today.  Allergies:  Allergies  Allergen Reactions   Codeine     unknown    Sulfa Antibiotics     unknown   Sulfacetamide  Sodium Other (See Comments)    unknown   Chlorhexidine Rash    Current Medications: Current Outpatient Medications  Medication Sig Dispense Refill   apixaban (ELIQUIS) 5 MG TABS tablet TAKE 1 TABLET BY MOUTH TWICE A DAY 180 tablet 1   atorvastatin (LIPITOR) 10 MG tablet Take 1 tablet by mouth every other day.  4   calcium carbonate (OSCAL) 1500 (600 Ca) MG TABS tablet Take 600 mg of elemental calcium by mouth daily with breakfast.     cholecalciferol (VITAMIN D) 1000 UNITS tablet Take 1,000 Units by mouth daily.     denosumab (XGEVA) 120 MG/1.7ML SOLN injection Inject 120 mg into the skin as directed. Once every 4 weeks     dexamethasone (DECADRON) 0.5 MG/5ML solution Take 5 mLs (0.5 mg  total) by mouth 2 (two) times daily. Swish for 2 minutes and spit at least twice daily. May increase up to 4 times daily if needed. 100 mL 0   everolimus (AFINITOR) 5 MG tablet Take 1 tablet (5 mg total) by mouth daily. 30 tablet 5   exemestane (AROMASIN) 25 MG tablet Take 1 tablet (25 mg total) by mouth daily after breakfast. 30 tablet 5   Flaxseed, Linseed, OIL Take 1 capsule by mouth daily.     glucosamine-chondroitin 500-400 MG tablet Take 1 tablet by mouth 2 (two) times daily.      levothyroxine (SYNTHROID, LEVOTHROID) 75 MCG tablet Take 75 mcg by mouth daily.   7   metoprolol tartrate (LOPRESSOR) 25 MG tablet Take 1 tablet (25 mg total) by mouth 2 (two) times daily. 180 tablet 2   Multiple Vitamin (MULTIVITAMIN WITH MINERALS) TABS Take 1 tablet by mouth daily.     ondansetron (ZOFRAN) 4 MG tablet Take 1 tablet (4 mg total) by mouth every 4 (four) hours as needed for nausea or vomiting. 30 tablet 5   prochlorperazine (COMPAZINE) 10 MG tablet Take 1 tablet (10 mg total) by mouth every 6 (six) hours as needed for nausea or vomiting. 30 tablet 5   No current facility-administered medications for this visit.

## 2023-07-24 NOTE — Telephone Encounter (Signed)
Attempted to reach patient for a scheduled remote nutrition consult. No answer after several rings phone disconnected.  No voicemail available.  Gennaro Africa, RDN, LDN Registered Dietitian, [facility] Part Time Remote (Usual office hours: Tuesday-Thursday) Cell: [phone]

## 2023-07-26 NOTE — Progress Notes (Signed)
Oral Chemotherapy Pharmacist Encounter  I spoke with patient for overview of: Afinitor (everolimus) for the treatment of metastatic, hormone-receptor positive breast cancer in conjunction with exemestane, planned duration until disease progression or unacceptable toxicity.   Counseled patient on administration, dosing, side effects, monitoring, drug-food interactions, safe handling, storage, and disposal.  Patient will take Afinitor 5mg  tablets, 1 tablet by mouth once daily, with water, without regard to food.  Patient understands to take Afinitor consistently with regards to food and at approximately the same time each day.  Patient knows to aviod grapefruit or grapefruit juice while on therapy with Afinitor.  Patient will take exemestane 25mg  tablets, 1 tablet by mouth once daily after a meal. Patient plans to take her Afinitor and exemestane daily.  Afinitor and exemestane start date: 07/13/23  Adverse effects include but are not limited to: mouth sores, GI upset, nausea, diarrhea, constipation, rash, increased blood sugars, decreased blood counts, increased blood pressure, pulmonary toxicity, and edema.   Dexamethasone mouthwash for the prevention of stomatitis has been e-scribed to local pharmacy.  We discussed appropriate use of mouthwash and duration of stomatitis prevention.  Reviewed with patient importance of keeping a medication schedule and plan for any missed doses. No barriers to medication adherence identified.  Medication reconciliation performed and medication/allergy list updated.  Insurance authorization for Afinitor has been obtained.  Patient informed the pharmacy will reach out 5-7 days prior to needing next fill of Afinitor to coordinate continued medication acquisition to prevent break in therapy.  All questions answered.  Patient voiced understanding and appreciation.   Medication education handout placed in mail for patient. Patient knows to call the office  with questions or concerns. Oral Chemotherapy Clinic phone number provided to patient.   Bethel Born, PharmD Hematology/Oncology Clinical Pharmacist [facility] [phone]

## 2023-07-28 ENCOUNTER — Other Ambulatory Visit: Payer: Self-pay

## 2023-07-30 ENCOUNTER — Encounter: Payer: Self-pay | Admitting: Oncology

## 2023-07-30 DIAGNOSIS — E871 Hypo-osmolality and hyponatremia: Secondary | ICD-10-CM | POA: Diagnosis not present

## 2023-07-30 DIAGNOSIS — Z6825 Body mass index (BMI) 25.0-25.9, adult: Secondary | ICD-10-CM | POA: Diagnosis not present

## 2023-07-30 DIAGNOSIS — R630 Anorexia: Secondary | ICD-10-CM | POA: Diagnosis not present

## 2023-07-30 NOTE — Progress Notes (Signed)
[facility]  [address] 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  She had progressive disease on imaging in June, while on letrozole and palbociclib.  She was then on abemaciclib 100 mg twice daily and fulvestrant monthly.  She had been having difficulty tolerating this due to taste changes, decreased appetite, weight loss, hyponatremia.  Abemaciclib was reduced to 100 mg once a day. We continued fulvestrant every 4 weeks.  Repeat CT imaging in September revealed mild progression of dominant liver metastasis, with a new 3 mm right lower lobe pulmonary nodule and persistent large right pleural effusion. She had persistent hyponatremia, so abemaciclib was discontinued. We then recommended switching therapy to exemestane/everolimus. She did not start that.  When she was seen on October 11, she had worsening hyponatremia, with a sodium of 116, and was hospitalized. Nephrology was consulted and suspicious of excess ADH. She had improvement in her sodium to 132 after treatment with tolvaptan.  She has been generally doing better.  Her appetite is still not good, but she is drinking 3 Ensure daily. There is worsening of her liver function tests.  She is drinking about 8 to 10 ounces of water daily.  Her sodium is normal at 141 today with an increased BUN of 36 and creatinine of 1.  We will have her increase her free water to 16 ounces a day.  She will proceed with denosumab today.   She is willing to try exemestane, but does not wish to try the everolimus at this time, so will start exemestane 25 mg daily. We will see her back in 1 week with a CBC and comprehensive metabolic panel for continued follow-up.  Malignant  neoplasm metastatic to bone [facility]) Bone metastasis diagnosed by PET in January, not seen on CT imaging. Her calcium is 10.1 today.  She is due for denosumab and we will proceed with that today.  Hyponatremia Hyponatremia of uncertain cause. This resolved with treatment with tolvaptan during hospitalization and fluid restriction. She is dehydrated, so will have her increase her free water intake to 16 ounces daily. Will see her again in 1 week to re-evaluate.  Anemia Felt to be due to oral chemotherapy. This has improved off of abemaciclib.    The patient understands the plans discussed today and is in agreement with them.  She knows to contact our office if she develops concerns prior to her next appointment.   I provided 30 minutes of face-to-face time during this encounter and > 50% was spent counseling as documented under my assessment and plan.    [name], PA-C  [facility] [address] [place] [address] Dept: [phone] Dept Fax: [phone]   Orders Placed This Encounter  Procedures   CBC and differential    This external order was created through the Results Console.   CBC    This external order was created through the Results Console.   Basic metabolic panel    This external order was created through the Results Console.   Comprehensive metabolic panel    This external order was created through the Results Console.   Hepatic function panel    This external order was created through  the Results Console.      CHIEF COMPLAINT:  CC: Metastatic hormone receptor positive breast cancer  Current Treatment:  Denosumab every 4 weeks  HISTORY OF PRESENT ILLNESS:  Austine Hackler is a 82 y.o. 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 She was treated with lumpectomy.  Pathology revealed a 1.5 cm, grade 1, invasive  ductal carcinoma with 5 positive nodes.  Estrogen and progesterone receptors were positive and Her-2 Neu negative.  She received adjuvant chemotherapy with Adriamycin/Cytoxan for 3 months, followed by Taxol for 3 months.  She then received adjuvant radiation to the left breast.  She was placed on letrozole in April 2008 and completed 10 years of adjuvant hormonal therapy in April 2018.  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   Repeat bone density in September 2017 revealed slight improvement in the bone density, with a T-score of -2.1 in the forearm and a T-score of -1.1 in the femur with, so she has continued denosumab every 6 months, in addition to calcium/vitamin D.  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    She had reversal of her colostomy in October 2017.  She has an incisional hernia in the abdomen, which does not bother her, so she has decided against surgical repair.  She has atrial fibrillation and is on apixaban 5 mg twice daily.  Bone density scan in September 2019 revealed improvement in the bone density with a T-score of -1.6 in forearm.  The bone density of the femur had normalized with a T-score of -0.8.  She has continued denosumab every 6 months.  Annual bilateral mammogram in December 2020 did not reveal any evidence of malignancy.  She contracted COVID-19 later in December 2020.  She did not have to be hospitalized and fully recovered.  Bone density from September 2021 revealed mildly worsened osteopenia with a T-score of -2.0 of the left forearm radius, previously -1.6.  Left femur neck is normal at -0.9, previously -0.8.  She continues calcium 1200 mg with vitamin-D daily.   She presented to the emergency department in December 2022 due to shortness of breath and left substernal area pain. ECHO revealed a  normal EF between 60-65%. CT chest revealed right middle and lower lobe collapse with large right pleural effusion. There was no central obstructing mass lesion evident, or obvious right-sided pleural disease, although there is a irregular focus of soft tissue attenuation along the medial right upper lobe pleura/right anterior mediastinum. There were scattered tiny bilateral pulmonary nodules measuring up to 4 mm. Thoracentesis was pursued yielding 1200 cc's of pleural fluid. Cytopathology confirmed malignant cells consistent with breast primary. CKAE1/3, GATA-3 and ER positive. HER2 was negative 1+. Estrogen receptor was positive at 90% and progesterone receptor was positive at 20%. Ki67 was <1%. MRI brain was negative for evidence of metastasis. PET imaging in January 2023 revealed no hypermetabolic activity to localize active metastatic breast carcinoma. Large right pleural effusion occupies 75% of the right hemithorax. Along the anteromedial margin of the right diaphragm there is hypermetabolic linear thickening of the diaphragm. She had a 2nd thoracentesis in January.  She was placed on anastrozole and palbociclib 125 mg daily for 3 weeks on and 1 week off.  She did not  required further thoracentesis.  The dose of palbociclib was reduced to 100 mg daily with the 3rd cycle due to neutropenia.  The CA 27-29 had decreased from 123.6 to 40.4.  The dose of palbociclib was decreased again to 75 mg daily for 3 weeks on and 1 week off with her 5th cycle due to neutropenia.    CT imaging in July 2023 revealed a decrease in the right pleural effusion and atelectasis without new or progressive disease.  She continued anastrozole/palbociclib which was eventually switched to 3 weeks on and 2 weeks off due to cytopenias.  Bone density scan in December 2023 revealed slightly worsened osteopenia with a T-score of -2.2 in the left forearm, previously -2.0.  Left femur bone density was normal.   Unfortunately, CT imaging in  January revealed a new nodule in the right middle love measuring 7 mm, with a persistent right pleural effusion, as well as 3 new hypoenhancing liver lesions consistent with metastasis, the largest measuring 18 mm.PET scan revealed widespread metastatic disease with metastatic pulmonary and pleural nodules, metastatic hepatic nodules and bone metastasis.  There was a a sizable right lower lobe/right infrahilar mass felt to be more suggestive of a recurrent lung cancer with associated mediastinal and hilar adenopathy.  Ultrasound-guided liver biopsy in February revealed metastatic carcinoma consistent with breast primary which was estrogen and progesterone receptor positive and HER2 negative.  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   CT imaging in June 2024 revealed progression of hepatic metastasis with development of innumerable bilobar lesions and enlargement of the previous lesions, the largest now measuring 2.3 x 2.5 cm.  There was enlargement of the right pleural effusion with a new tiny left pleural effusion, with stable right middle lobe pulmonary nodule during 6 mm.  Apparently, the hypermetabolic right infrahilar mass measuring approximately 2.4 cm is not appreciated on CT.  Due to the progressive disease, Orserdu was discontinued.  She was placed on abemaciclib 100 mg twice daily with fulvestrant injections monthly and continued denosumab injections monthly.  She had been tolerating this regimen well.  CA 27-29 had increased to 422 in August, but we had not checked it since November.  The CA 27-29 was down slightly to 414 on September 6.  Her sodium was also low at 128.  She was not eating and was felt to be dehydrated, so she received 1 L of normal saline and dexamethasone 8 mg.  Her sodium was even lower on September 13 and she received IV fluids and dexamethasone again.  Urine osmolality was normal.   As she continued to do  poorly despite reducing the dose of abemaciclib, I repeated CT chest, abdomen and pelvis. This revealed mild progression of dominant liver metastasis, with a new 3 mm right lower lobe pulmonary nodule and persistent large right pleural effusion. She had persistent hyponatremia. Abemaciclib was discontinued. We then recommended switching therapy to exemestane/everolimus. She did not start that. When she was seen on October 11, she had worsening hyponatremia, with a sodium of 116, and was hospitalized.   Oncology History  Malignant neoplasm of upper-outer quadrant of left female breast (HCC)  03/07/2006 Initial Diagnosis   Breast cancer, left breast (HCC)   03/17/2006 Cancer Staging   Staging form: Breast, AJCC 6th Edition - Clinical stage from 03/17/2006: Stage IIIA (T1c, N2a, M0) - Signed by Dellia Beckwith, MD on 12/15/2020 Staged by: Managing physician Diagnostic confirmation: Positive  histology Specimen type: Excision Histopathologic type: Infiltrating duct carcinoma, NOS Tumor size (mm): 15 Laterality: Left Total positive nodes: 5 Histologic grade (G): G1 Residual tumor (R): R0 - None Stage prefix: Initial diagnosis Lymphatic vessel invasion (L): L0 - No lymphatic vessel invasion Venous invasion (V): V0 - No venous invasion Prognostic indicators: ER/PR pos, HER 2 neg., Rx with AC x 3 mo, then Taxol x 3 mo.,AI x 10 yrs   04/04/2023 - 06/27/2023 Chemotherapy   Patient is on Treatment Plan : BREAST Fulvestrant q28d     Malignant neoplasm metastatic to bone (HCC)  11/08/2022 Initial Diagnosis   Malignant neoplasm metastatic to bone (HCC)   04/04/2023 - 06/27/2023 Chemotherapy   Patient is on Treatment Plan : BREAST Fulvestrant q28d         INTERVAL HISTORY:  Carrie Wilkerson is here today for repeat clinical assessment.  She was hospitalized on October 11 due to worsening hyponatremia.  Chest x-ray revealed persistent large right pleural effusion with associated atelectasis/airspace disease is,  which was similar to prior.  Nephrology felt this was likely due to excess ADH.  3% normal saline and furosemide were ineffective, so she was given tolvaptan, a vasopressin antagonist, with improvement. She was discharged home on fluid restriction.  She states she has been drinking 8 to 10 ounces of water a day and 3 Ensure.  Reports fatigue relieved with rest.  She has occasional dry cough and shortness of breath with exertion.  She denies fevers or chills. She denies pain. Her appetite is poor. Her weight has decreased 7 pounds over last 3 weeks .  She wanted to dispose of her everolimus, but I encouraged her to hold onto that.  She can take her other medications to [facility] or the sheriff's department for disposal.  REVIEW OF SYSTEMS:  Review of Systems  Constitutional:  Positive for fatigue. Negative for appetite change, chills, fever and unexpected weight change.  HENT:   Negative for lump/mass, mouth sores and sore throat.   Respiratory:  Positive for cough and shortness of breath.   Cardiovascular:  Negative for chest pain and leg swelling.  Gastrointestinal:  Negative for abdominal pain, constipation, diarrhea, nausea and vomiting.  Endocrine: Negative for hot flashes.  Genitourinary:  Negative for difficulty urinating, dysuria, frequency and hematuria.   Musculoskeletal:  Negative for arthralgias, back pain and myalgias.  Skin:  Negative for rash.  Neurological:  Negative for dizziness and headaches.  Hematological:  Negative for adenopathy. Does not bruise/bleed easily.  Psychiatric/Behavioral:  Negative for depression and sleep disturbance. The patient is not nervous/anxious.      VITALS:  Blood pressure 107/66, pulse (!) 108, temperature 97.7 F (36.5 C), temperature source Oral, resp. rate 18, height 5\' 2"  (1.575 m), weight 137 lb (62.1 kg), SpO2 96%.  Wt Readings from Last 3 Encounters:  07/31/23 137 lb (62.1 kg)  07/09/23 143 lb 11.2 oz (65.2 kg)  07/02/23 145 lb 12.8 oz (66.1  kg)    Body mass index is 25.06 kg/m.  Performance status (ECOG): 2 - Symptomatic, <50% confined to bed  PHYSICAL EXAM:  Physical Exam Vitals and nursing note reviewed.  Constitutional:      General: She is not in acute distress.    Comments: Ambulates with a cane  HENT:     Head: Normocephalic and atraumatic.     Mouth/Throat:     Mouth: Mucous membranes are dry.     Pharynx: Oropharynx is clear. No oropharyngeal exudate or posterior oropharyngeal erythema.  Eyes:     General: No scleral icterus.    Extraocular Movements: Extraocular movements intact.     Conjunctiva/sclera: Conjunctivae normal.     Pupils: Pupils are equal, round, and reactive to light.  Cardiovascular:     Rate and Rhythm: Normal rate and regular rhythm.     Heart sounds: Normal heart sounds. No murmur heard.    No friction rub. No gallop.  Pulmonary:     Effort: Pulmonary effort is normal.     Breath sounds: Normal breath sounds. No wheezing, rhonchi or rales.  Abdominal:     General: There is no distension.     Palpations: Abdomen is soft. There is no mass.     Tenderness: There is no abdominal tenderness.     Comments: Unable to assess for hepatosplenomegaly as the patient cannot get on the examining table.  Musculoskeletal:        General: Normal range of motion.     Cervical back: Normal range of motion and neck supple. No tenderness.     Right lower leg: No edema.     Left lower leg: No edema.  Lymphadenopathy:     Cervical: No cervical adenopathy.     Upper Body:     Right upper body: No supraclavicular or axillary adenopathy.     Left upper body: No supraclavicular or axillary adenopathy.  Skin:    General: Skin is warm and dry.     Coloration: Skin is not jaundiced.     Findings: No rash.  Neurological:     Mental Status: She is alert and oriented to person, place, and time.     Cranial Nerves: No cranial nerve deficit.  Psychiatric:        Mood and Affect: Mood normal.         Behavior: Behavior normal.        Thought Content: Thought content normal.     LABS:      Latest Ref Rng & Units 07/31/2023   12:00 AM 07/18/2023   12:00 AM 07/09/2023    1:21 PM  CBC  WBC  7.1     5.1     3.9      Hemoglobin 12.0 - 16.0 12.7     11.3     10.0      Hematocrit 36 - 46 38     32     29      Platelets 150 - 400 K/uL 347     427     320         This result is from an external source.      Latest Ref Rng & Units 07/31/2023   12:00 AM 07/18/2023   12:00 AM 07/09/2023    1:21 PM  CMP  BUN 4 - 21 36     12     11      Creatinine 0.5 - 1.1 1.0     0.6     0.7      Sodium 137 - 147 141     116     123      Potassium 3.5 - 5.1 mEq/L 4.1     4.2     4.7      Chloride 99 - 108 102     84     89      CO2 13 - 22 33     26     30  Calcium 8.7 - 10.7 10.1     8.9     9.1      Alkaline Phos 25 - 125 272     197     185      AST 13 - 35 149     95     64      ALT 7 - 35 U/L 95     51     32         This result is from an external source.     Lab Results  Component Value Date   CEA1 1.6 10/11/2021   /  CEA  Date Value Ref Range Status  10/11/2021 1.6 0.0 - 4.7 ng/mL Final    Comment:    (NOTE)                             Nonsmokers          <3.9                             Smokers             <5.6 Roche [employer] Electrochemiluminescence Immunoassay (ECLIA) Values obtained with different assay methods or kits cannot be used interchangeably.  Results cannot be interpreted as absolute evidence of the presence or absence of malignant disease. Performed At: [facility] [address] [place], [place] [id number] Jolene Schimke MD WU:[id number]    No results found for: "PSA1" No results found for: "CAN199" No results found for: "CAN125"  No results found for: "TOTALPROTELP", "ALBUMINELP", "A1GS", "A2GS", "BETS", "BETA2SER", "GAMS", "MSPIKE", "SPEI" Lab Results  Component Value Date   FERRITIN 387 (H) 07/02/2023   No results found for:  "LDH"  STUDIES:  No results found.    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  Report date is 11/29/21.   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   Mixed dyslipidemia 11/03/2018   Numbness in right leg    Hx: of   Obesity (BMI 30.0-34.9) 09/21/2020   Other specified disorders of bone density and structure, multiple sites 09/20/2020   Paroxysmal atrial fibrillation (HCC) 02/13/2016   Personal history of COVID-19 09/2019   Pleural effusion, 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   Pneumonia  Hx: of 'a long time ago"   Postoperative examination 02/05/2016    Past Surgical History:  Procedure Laterality Date   APPENDECTOMY     BACK SURGERY     BREAST SURGERY     Hx: of  lumpectomy left breast 2007   COLONOSCOPY     Hx: of   DILATION AND CURETTAGE OF UTERUS     TONSILLECTOMY     TOTAL HIP ARTHROPLASTY Right 04/13/2013   Dr Magnus Ivan   TOTAL HIP ARTHROPLASTY Right 04/13/2013   Procedure: RIGHT TOTAL HIP ARTHROPLASTY ANTERIOR APPROACH;  Surgeon: Kathryne Hitch, MD;  Location: [facility] OR;  Service: Orthopedics;  Laterality: Right;   TUBAL LIGATION      Family History  Problem Relation Age of Onset   Hypertension Sister    Cancer Sister 59       uterine or ovarian   Bone cancer Sister 2   Breast cancer Sister        dx 62 and 30   Uterine cancer Sister 25   Colon cancer Sister 32   Breast cancer Sister 61   Hypertension Brother    Kidney cancer Brother 15   Cancer Brother 42       unknown type; mets   Liver cancer Brother    Liver cancer Brother 68   Pancreatic cancer Niece 66   Breast cancer Niece 55   Colon cancer Nephew 57       mets    Social History:  reports that she has never smoked. She has never used smokeless tobacco. She reports that she does not drink alcohol and does not use drugs.The patient is accompanied by her husband and daughter today.  Allergies:  Allergies  Allergen Reactions   Codeine     unknown    Sulfa Antibiotics     unknown   Sulfacetamide Sodium Other (See Comments)    unknown   Chlorhexidine Rash    Current Medications: Current Outpatient Medications  Medication Sig Dispense Refill   apixaban (ELIQUIS) 5 MG TABS tablet TAKE 1 TABLET BY MOUTH TWICE A DAY 180 tablet 1   atorvastatin (LIPITOR) 10 MG tablet Take 1 tablet by mouth every other day.  4   calcium carbonate (OSCAL) 1500 (600 Ca) MG TABS tablet Take 600 mg of elemental calcium by mouth daily with breakfast.     cholecalciferol (VITAMIN D) 1000 UNITS tablet Take 1,000 Units by mouth daily.     denosumab (XGEVA) 120 MG/1.7ML SOLN injection Inject 120 mg into the skin as directed. Once every 4 weeks     dexamethasone (DECADRON) 0.5 MG/5ML  solution Take 5 mLs (0.5 mg total) by mouth 2 (two) times daily. Swish for 2 minutes and spit at least twice daily. May increase up to 4 times daily if needed. (Patient not taking: Reported on 07/31/2023) 100 mL 0   everolimus (AFINITOR) 5 MG tablet Take 1 tablet (5 mg total) by mouth daily. (Patient not taking: Reported on 07/31/2023) 30 tablet 5   exemestane (AROMASIN) 25 MG tablet Take 1 tablet (25 mg total) by mouth daily after breakfast. (Patient not taking: Reported on 07/31/2023) 30 tablet 5   Flaxseed, Linseed, OIL Take 1 capsule by mouth daily.     glucosamine-chondroitin 500-400 MG tablet Take 1 tablet by mouth 2 (two) times daily.      levothyroxine (SYNTHROID, LEVOTHROID) 75 MCG tablet Take 75 mcg by mouth daily.   7   metoprolol tartrate (LOPRESSOR) 25 MG tablet Take 1  tablet (25 mg total) by mouth 2 (two) times daily. 180 tablet 2   Multiple Vitamin (MULTIVITAMIN WITH MINERALS) TABS Take 1 tablet by mouth daily.     ondansetron (ZOFRAN) 4 MG tablet Take 1 tablet (4 mg total) by mouth every 4 (four) hours as needed for nausea or vomiting. 30 tablet 5   prochlorperazine (COMPAZINE) 10 MG tablet Take 1 tablet (10 mg total) by mouth every 6 (six) hours as needed for nausea or vomiting. 30 tablet 5   No current facility-administered medications for this visit.

## 2023-07-31 ENCOUNTER — Inpatient Hospital Stay: Payer: Medicare HMO

## 2023-07-31 ENCOUNTER — Encounter: Payer: Self-pay | Admitting: Hematology and Oncology

## 2023-07-31 ENCOUNTER — Encounter: Payer: Self-pay | Admitting: Oncology

## 2023-07-31 ENCOUNTER — Inpatient Hospital Stay: Payer: Medicare HMO | Admitting: Hematology and Oncology

## 2023-07-31 VITALS — BP 107/66 | HR 108 | Temp 97.7°F | Resp 18 | Ht 62.0 in | Wt 137.0 lb

## 2023-07-31 VITALS — BP 109/64 | HR 102 | Temp 98.0°F | Resp 18

## 2023-07-31 DIAGNOSIS — C787 Secondary malignant neoplasm of liver and intrahepatic bile duct: Secondary | ICD-10-CM | POA: Diagnosis not present

## 2023-07-31 DIAGNOSIS — C7951 Secondary malignant neoplasm of bone: Secondary | ICD-10-CM

## 2023-07-31 DIAGNOSIS — Z170 Estrogen receptor positive status [ER+]: Secondary | ICD-10-CM

## 2023-07-31 DIAGNOSIS — E871 Hypo-osmolality and hyponatremia: Secondary | ICD-10-CM | POA: Diagnosis not present

## 2023-07-31 DIAGNOSIS — D649 Anemia, unspecified: Secondary | ICD-10-CM

## 2023-07-31 DIAGNOSIS — C50412 Malignant neoplasm of upper-outer quadrant of left female breast: Secondary | ICD-10-CM

## 2023-07-31 LAB — COMPREHENSIVE METABOLIC PANEL
Albumin: 3.7 (ref 3.5–5.0)
Albumin: 3.7 (ref 3.5–5.0)
Calcium: 10.1 (ref 8.7–10.7)
Calcium: 10.1 (ref 8.7–10.7)
EGFR: 53

## 2023-07-31 LAB — HEPATIC FUNCTION PANEL
ALT: 95 U/L — AB (ref 7–35)
ALT: 95 U/L — AB (ref 7–35)
AST: 149 — AB (ref 13–35)
AST: 149 — AB (ref 13–35)
Alkaline Phosphatase: 272 — AB (ref 25–125)
Alkaline Phosphatase: 272 — AB (ref 25–125)
Bilirubin, Total: 0.8
Bilirubin, Total: 0.8

## 2023-07-31 LAB — CBC AND DIFFERENTIAL
HCT: 38 (ref 36–46)
HCT: 38 (ref 36–46)
Hemoglobin: 12.7 (ref 12.0–16.0)
Hemoglobin: 12.7 (ref 12.0–16.0)
MCV: 101 — AB (ref 81–99)
Neutrophils Absolute: 5.47
Neutrophils Absolute: 5.47
Platelets: 347 10*3/uL (ref 150–400)
Platelets: 347 10*3/uL (ref 150–400)
WBC: 7.1
WBC: 7.1

## 2023-07-31 LAB — CBC
RBC: 3.77 — AB (ref 3.87–5.11)
RBC: 3.77 — AB (ref 3.87–5.11)

## 2023-07-31 LAB — BASIC METABOLIC PANEL
BUN: 36 — AB (ref 4–21)
BUN: 36 — AB (ref 4–21)
CO2: 33 — AB (ref 13–22)
CO2: 33 — AB (ref 13–22)
Chloride: 102 (ref 99–108)
Chloride: 102 (ref 99–108)
Creatinine: 1 (ref 0.5–1.1)
Creatinine: 1 (ref 0.5–1.1)
Glucose: 153
Glucose: 153
Potassium: 4.1 meq/L (ref 3.5–5.1)
Potassium: 4.1 meq/L (ref 3.5–5.1)
Sodium: 141 (ref 137–147)
Sodium: 141 (ref 137–147)

## 2023-07-31 MED ORDER — DENOSUMAB 120 MG/1.7ML ~~LOC~~ SOLN
120.0000 mg | Freq: Once | SUBCUTANEOUS | Status: AC
  Administered 2023-07-31: 120 mg via SUBCUTANEOUS
  Filled 2023-07-31: qty 1.7

## 2023-07-31 NOTE — Assessment & Plan Note (Addendum)
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  She had progressive disease on imaging in June, while on letrozole and palbociclib.  She was then on abemaciclib 100 mg twice daily and fulvestrant monthly.  She had been having difficulty tolerating this due to taste changes, decreased appetite, weight loss, hyponatremia.  Abemaciclib was reduced to 100 mg once a day. We continued fulvestrant every 4 weeks.  Repeat CT imaging in September revealed mild progression of dominant liver metastasis, with a new 3 mm right lower lobe pulmonary nodule and persistent large right pleural effusion. She had persistent hyponatremia, so abemaciclib was discontinued. We then recommended switching therapy to exemestane/everolimus. She did not start that.  When she was seen on October 11, she had worsening hyponatremia, with a sodium of 116, and was hospitalized. Nephrology was consulted and suspicious of excess ADH. She had improvement in her sodium to 132 after treatment with tolvaptan.  She has been generally doing better.  Her appetite is still not good, but she is drinking 3 Ensure daily. There is worsening of her liver function tests.  She is drinking about 8 to 10 ounces of water daily.  Her sodium is normal at 141 today with an increased BUN of 36 and creatinine of 1.  We will have her increase her free water to 16 ounces a day.  She will proceed with denosumab today.   She is willing to try exemestane, but does not wish to try the everolimus at this time, so will start exemestane 25 mg daily. We will see her back in 1 week with a CBC and comprehensive metabolic panel for continued follow-up.

## 2023-07-31 NOTE — Patient Instructions (Signed)
Denosumab Injection (Oncology) What is this medication? DENOSUMAB (den oh SUE mab) prevents weakened bones caused by cancer. It may also be used to treat noncancerous bone tumors that cannot be removed by surgery. It can also be used to treat high calcium levels in the blood caused by cancer. It works by blocking a protein that causes bones to break down quickly. This slows down the release of calcium from bones, which lowers calcium levels in your blood. It also makes your bones stronger and less likely to break (fracture). This medicine may be used for other purposes; ask your health care provider or pharmacist if you have questions. COMMON BRAND NAME(S): XGEVA What should I tell my care team before I take this medication? They need to know if you have any of these conditions: Dental disease Having surgery or tooth extraction Infection Kidney disease Low levels of calcium or vitamin D in the blood Malnutrition On hemodialysis Skin conditions or sensitivity Thyroid or parathyroid disease An unusual reaction to denosumab, other medications, foods, dyes, or preservatives Pregnant or trying to get pregnant Breast-feeding How should I use this medication? This medication is for injection under the skin. It is given by your care team in a hospital or clinic setting. A special MedGuide will be given to you before each treatment. Be sure to read this information carefully each time. Talk to your care team about the use of this medication in children. While it may be prescribed for children as young as 13 years for selected conditions, precautions do apply. Overdosage: If you think you have taken too much of this medicine contact a poison control center or emergency room at once. NOTE: This medicine is only for you. Do not share this medicine with others. What if I miss a dose? Keep appointments for follow-up doses. It is important not to miss your dose. Call your care team if you are unable to  keep an appointment. What may interact with this medication? Do not take this medication with any of the following: Other medications containing denosumab This medication may also interact with the following: Medications that lower your chance of fighting infection Steroid medications, such as prednisone or cortisone This list may not describe all possible interactions. Give your health care provider a list of all the medicines, herbs, non-prescription drugs, or dietary supplements you use. Also tell them if you smoke, drink alcohol, or use illegal drugs. Some items may interact with your medicine. What should I watch for while using this medication? Your condition will be monitored carefully while you are receiving this medication. You may need blood work while taking this medication. This medication may increase your risk of getting an infection. Call your care team for advice if you get a fever, chills, sore throat, or other symptoms of a cold or flu. Do not treat yourself. Try to avoid being around people who are sick. You should make sure you get enough calcium and vitamin D while you are taking this medication, unless your care team tells you not to. Discuss the foods you eat and the vitamins you take with your care team. Some people who take this medication have severe bone, joint, or muscle pain. This medication may also increase your risk for jaw problems or a broken thigh bone. Tell your care team right away if you have severe pain in your jaw, bones, joints, or muscles. Tell your care team if you have any pain that does not go away or that gets worse. Talk   to your care team if you may be pregnant. Serious birth defects can occur if you take this medication during pregnancy and for 5 months after the last dose. You will need a negative pregnancy test before starting this medication. Contraception is recommended while taking this medication and for 5 months after the last dose. Your care team  can help you find the option that works for you. What side effects may I notice from receiving this medication? Side effects that you should report to your care team as soon as possible: Allergic reactions--skin rash, itching, hives, swelling of the face, lips, tongue, or throat Bone, joint, or muscle pain Low calcium level--muscle pain or cramps, confusion, tingling, or numbness in the hands or feet Osteonecrosis of the jaw--pain, swelling, or redness in the mouth, numbness of the jaw, poor healing after dental work, unusual discharge from the mouth, visible bones in the mouth Side effects that usually do not require medical attention (report to your care team if they continue or are bothersome): Cough Diarrhea Fatigue Headache Nausea This list may not describe all possible side effects. Call your doctor for medical advice about side effects. You may report side effects to FDA at 1-800-FDA-1088. Where should I keep my medication? This medication is given in a hospital or clinic. It will not be stored at home. NOTE: This sheet is a summary. It may not cover all possible information. If you have questions about this medicine, talk to your doctor, pharmacist, or health care provider.  2024 [employer] Standard (2022-02-13 00:00:00)  

## 2023-07-31 NOTE — Assessment & Plan Note (Signed)
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 This has improved off of abemaciclib.

## 2023-07-31 NOTE — Assessment & Plan Note (Signed)
Hyponatremia of uncertain cause. This resolved with treatment with tolvaptan during hospitalization and fluid restriction. She is dehydrated, so will have her increase her free water intake to 16 ounces daily. Will see her again in 1 week to re-evaluate.

## 2023-07-31 NOTE — Assessment & Plan Note (Addendum)
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 Her calcium is 10.1 today.  She is due for denosumab and we will proceed with that today.

## 2023-08-01 ENCOUNTER — Encounter: Payer: Self-pay | Admitting: Oncology

## 2023-08-07 ENCOUNTER — Other Ambulatory Visit: Payer: Self-pay | Admitting: Oncology

## 2023-08-07 ENCOUNTER — Inpatient Hospital Stay (HOSPITAL_BASED_OUTPATIENT_CLINIC_OR_DEPARTMENT_OTHER): Payer: Medicare HMO | Admitting: Oncology

## 2023-08-07 ENCOUNTER — Encounter: Payer: Medicare HMO | Admitting: Dietician

## 2023-08-07 ENCOUNTER — Encounter: Payer: Self-pay | Admitting: Oncology

## 2023-08-07 ENCOUNTER — Inpatient Hospital Stay: Payer: Medicare HMO

## 2023-08-07 ENCOUNTER — Telehealth: Payer: Self-pay | Admitting: Dietician

## 2023-08-07 ENCOUNTER — Ambulatory Visit: Payer: Medicare HMO

## 2023-08-07 VITALS — BP 120/53 | HR 119 | Temp 97.6°F | Resp 22 | Ht 62.0 in | Wt 137.7 lb

## 2023-08-07 DIAGNOSIS — C787 Secondary malignant neoplasm of liver and intrahepatic bile duct: Secondary | ICD-10-CM | POA: Diagnosis not present

## 2023-08-07 DIAGNOSIS — Z170 Estrogen receptor positive status [ER+]: Secondary | ICD-10-CM

## 2023-08-07 DIAGNOSIS — C50412 Malignant neoplasm of upper-outer quadrant of left female breast: Secondary | ICD-10-CM | POA: Diagnosis not present

## 2023-08-07 DIAGNOSIS — C7951 Secondary malignant neoplasm of bone: Secondary | ICD-10-CM | POA: Diagnosis not present

## 2023-08-07 DIAGNOSIS — J209 Acute bronchitis, unspecified: Secondary | ICD-10-CM

## 2023-08-07 DIAGNOSIS — C50919 Malignant neoplasm of unspecified site of unspecified female breast: Secondary | ICD-10-CM | POA: Diagnosis not present

## 2023-08-07 DIAGNOSIS — J90 Pleural effusion, not elsewhere classified: Secondary | ICD-10-CM | POA: Diagnosis not present

## 2023-08-07 DIAGNOSIS — J9819 Other pulmonary collapse: Secondary | ICD-10-CM | POA: Diagnosis not present

## 2023-08-07 DIAGNOSIS — J910 Malignant pleural effusion: Secondary | ICD-10-CM | POA: Diagnosis not present

## 2023-08-07 LAB — CBC AND DIFFERENTIAL
HCT: 39 (ref 36–46)
Hemoglobin: 13.1 (ref 12.0–16.0)
Neutrophils Absolute: 5.8
Platelets: 386 10*3/uL (ref 150–400)
WBC: 8.4

## 2023-08-07 LAB — COMPREHENSIVE METABOLIC PANEL
Albumin: 3.5 (ref 3.5–5.0)
Calcium: 9.3 (ref 8.7–10.7)
EGFR: 60

## 2023-08-07 LAB — BASIC METABOLIC PANEL
BUN: 24 — AB (ref 4–21)
CO2: 34 — AB (ref 13–22)
Chloride: 102 (ref 99–108)
Creatinine: 0.9 (ref 0.5–1.1)
Glucose: 186
Potassium: 4 meq/L (ref 3.5–5.1)
Sodium: 140 (ref 137–147)

## 2023-08-07 LAB — HEPATIC FUNCTION PANEL
ALT: 126 U/L — AB (ref 7–35)
AST: 245 — AB (ref 13–35)
Alkaline Phosphatase: 307 — AB (ref 25–125)
Bilirubin, Total: 0.9

## 2023-08-07 LAB — CBC: RBC: 3.97 (ref 3.87–5.11)

## 2023-08-07 MED ORDER — AMOXICILLIN-POT CLAVULANATE 875-125 MG PO TABS: 1.0000 | ORAL_TABLET | Freq: Two times a day (BID) | ORAL | 0 refills | Status: AC

## 2023-08-07 NOTE — Progress Notes (Signed)
[facility]  [address] 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  She had progressive disease on imaging in June, while on letrozole and palbociclib.  She was then on abemaciclib 100 mg twice daily and fulvestrant monthly.  She had been having difficulty tolerating this due to taste changes, decreased appetite, weight loss, hyponatremia.  Abemaciclib was reduced to 100 mg once a day. We continued fulvestrant every 4 weeks.   Repeat CT imaging in September revealed mild progression of dominant liver metastasis, with a new 3 mm right lower lobe pulmonary nodule and persistent large right pleural effusion. She had persistent hyponatremia, so abemaciclib was discontinued. We then recommended switching therapy to exemestane/everolimus. She did not start that.  When she was seen on October 11, she had worsening hyponatremia, with a sodium of 116, and was hospitalized. Nephrology was consulted and suspicious of excess ADH. She had improvement in her sodium to 132 after treatment with tolvaptan.   There is worsening of her liver function tests and her liver is enlarging dramatically. She is trying the exemestane 25mg  daily, has been on it for about a week.  She does not wish to try the everolimus, and I agree. She is becoming more symptomatic with anorexia.   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 She clearly had a right pleural effusion on her X-ray from 07/19/2023 and now it has rapidly accumulated.  She is very symptomatic and we will arrange for a ultrasound guided thoracentesis today. Dr. Denny Levy was able to obtain 1200cc of fluid and we will send it  for cytology but I have no doubt that this is malignant.   Malignant neoplasm metastatic to bone [facility]) Bone metastasis diagnosed by PET in January, not seen on CT imaging. Her calcium is 10.1 today.  She is due for denosumab and we will proceed with that today.   Hyponatremia Hyponatremia of uncertain cause. This resolved with treatment with tolvaptan during hospitalization and fluid restriction. Today her sodium is normal at 140.   Plan: She just began taking Anastrozole last week. During a short walk her O2 saturation was 94 but her heart rate continued to stay in the 120's. During physical exam I heard decreased breath sound throughout the entire right lung and enlargement of her liver with worsening liver function tests. I believe her cancer to be progressing fast.  I believe she will need her lungs tapped and if she does this will be her third time. I informed her of a pleurX catheter and informed her she may be able to tap fluid from her lungs at home. I informed them about the option of Hospice and the care they will provide as this will assist her medical needs. I will refer her to Hospice so her and her family can further discuss options with them. Dr. Denny Levy is able to tap fluids from her lungs today but is not able to put in a pleurX so we will probably have to schedule this later. Her WBC is 8.4, hemoglobin is 13.1, and platelet count is 386,000 as of today. She has a elevated BUN of 34, AST of 245, ALT of 126, and alkaline phosphate of 307. I will  prescribe Augmentin 875 mg twice daily for 10 days, and see her back in 1-2 weeks with CBC and CMP. The patient understands the plans discussed today and is in agreement with them.  She knows to contact our office if she develops concerns prior to her next appointment.  I provided 38 minutes of face-to-face time during this encounter and > 50% was spent counseling as documented under my assessment and plan.   [facility] [address] [place] [address] Dept: [phone] Dept Fax: [phone]        CHIEF COMPLAINT:  CC: Metastatic hormone receptor positive breast cancer  Current Treatment:  Denosumab every 4 weeks  HISTORY OF PRESENT ILLNESS:  Carrie Wilkerson is a 82 y.o. 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 She was treated with lumpectomy.  Pathology revealed a 1.5 cm, grade 1, invasive ductal carcinoma with 5 positive nodes.  Estrogen and progesterone receptors were positive and Her-2 Neu negative.  She received adjuvant chemotherapy with Adriamycin/Cytoxan for 3 months, followed by Taxol for 3 months.  She then received adjuvant radiation to the left breast.  She was placed on letrozole in April 2008 and completed 10 years of adjuvant hormonal therapy in April 2018.  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   Repeat bone density in September 2017 revealed slight improvement in the bone density, with a T-score of -2.1 in the forearm and a T-score of -1.1 in the femur with, so she has continued denosumab every 6 months, in addition to calcium/vitamin D.  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    She had reversal of her colostomy in October 2017.  She has an incisional hernia in the abdomen, which does not bother her, so she has decided against surgical repair.  She has atrial fibrillation and is on apixaban 5 mg twice daily.  Bone density scan in September 2019 revealed improvement in the bone density with a T-score of -1.6 in forearm.  The bone density of the femur had normalized with a T-score of -0.8.  She has continued denosumab every 6 months.  Annual bilateral mammogram in December 2020 did  not reveal any evidence of malignancy.  She contracted COVID-19 later in December 2020.  She did not have to be hospitalized and fully recovered.  Bone density from September 2021 revealed mildly worsened osteopenia with a T-score of -2.0 of the left forearm radius, previously -1.6.  Left femur neck is normal at -0.9, previously -0.8.  She continues calcium 1200 mg with vitamin-D daily.   She presented to the emergency department in December 2022 due to shortness of breath and left substernal area pain. ECHO revealed a normal EF between 60-65%. CT chest revealed right middle and lower lobe collapse with large right pleural effusion. There was no central obstructing mass lesion evident, or obvious right-sided pleural disease, although there is a irregular focus of soft tissue attenuation along the medial right upper lobe pleura/right anterior mediastinum. There were scattered tiny bilateral pulmonary nodules measuring up to 4 mm. Thoracentesis was pursued yielding 1200 cc's of pleural fluid. Cytopathology confirmed malignant cells consistent with breast primary. CKAE1/3, GATA-3 and ER positive. HER2 was negative  1+. Estrogen receptor was positive at 90% and progesterone receptor was positive at 20%. Ki67 was <1%. MRI brain was negative for evidence of metastasis. PET imaging in January 2023 revealed no hypermetabolic activity to localize active metastatic breast carcinoma. Large right pleural effusion occupies 75% of the right hemithorax. Along the anteromedial margin of the right diaphragm there is hypermetabolic linear thickening of the diaphragm. She had a 2nd thoracentesis in January.  She was placed on anastrozole and palbociclib 125 mg daily for 3 weeks on and 1 week off.  She did not required further thoracentesis.  The dose of palbociclib was reduced to 100 mg daily with the 3rd cycle due to neutropenia.  The CA 27-29 had decreased from 123.6 to 40.4.  The dose of palbociclib was decreased again to 75 mg  daily for 3 weeks on and 1 week off with her 5th cycle due to neutropenia.    CT imaging in July 2023 revealed a decrease in the right pleural effusion and atelectasis without new or progressive disease.  She continued anastrozole/palbociclib which was eventually switched to 3 weeks on and 2 weeks off due to cytopenias.  Bone density scan in December 2023 revealed slightly worsened osteopenia with a T-score of -2.2 in the left forearm, previously -2.0.  Left femur bone density was normal.   Unfortunately, CT imaging in January revealed a new nodule in the right middle love measuring 7 mm, with a persistent right pleural effusion, as well as 3 new hypoenhancing liver lesions consistent with metastasis, the largest measuring 18 mm.PET scan revealed widespread metastatic disease with metastatic pulmonary and pleural nodules, metastatic hepatic nodules and bone metastasis.  There was a a sizable right lower lobe/right infrahilar mass felt to be more suggestive of a recurrent lung cancer with associated mediastinal and hilar adenopathy.  Ultrasound-guided liver biopsy in February revealed metastatic carcinoma consistent with breast primary which was estrogen and progesterone receptor positive and HER2 negative.  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   CT imaging in June 2024 revealed progression of hepatic metastasis with development of innumerable bilobar lesions and enlargement of the previous lesions, the largest now measuring 2.3 x 2.5 cm.  There was enlargement of the right pleural effusion with a new tiny left pleural effusion, with stable right middle lobe pulmonary nodule during 6 mm.  Apparently, the hypermetabolic right infrahilar mass measuring approximately 2.4 cm is not appreciated on CT.  Due to the progressive disease, Orserdu was discontinued.  She was placed on abemaciclib 100 mg twice daily with fulvestrant injections  monthly and continued denosumab injections monthly.  She had been tolerating this regimen well.  CA 27-29 had increased to 422 in August, but we had not checked it since November.  The CA 27-29 was down slightly to 414 on September 6.  Her sodium was also low at 128.  She was not eating and was felt to be dehydrated, so she received 1 L of normal saline and dexamethasone 8 mg.  Her sodium was even lower on September 13 and she received IV fluids and dexamethasone again.  Urine osmolality was normal.   As she continued to do poorly despite reducing the dose of abemaciclib, I repeated CT chest, abdomen and pelvis. This revealed mild progression of dominant liver metastasis, with a new 3 mm right lower lobe pulmonary nodule and persistent large right pleural effusion. She had persistent hyponatremia. Abemaciclib was discontinued.  We then recommended switching therapy to exemestane/everolimus. She did not start that. When she was seen on October 11, she had worsening hyponatremia, with a sodium of 116, and was hospitalized.   Oncology History  Malignant neoplasm of upper-outer quadrant of left female breast (HCC)  03/07/2006 Initial Diagnosis   Breast cancer, left breast (HCC)   03/17/2006 Cancer Staging   Staging form: Breast, AJCC 6th Edition - Clinical stage from 03/17/2006: Stage IIIA (T1c, N2a, M0) - Signed by Dellia Beckwith, MD on 12/15/2020 Staged by: Managing physician Diagnostic confirmation: Positive histology Specimen type: Excision Histopathologic type: Infiltrating duct carcinoma, NOS Tumor size (mm): 15 Laterality: Left Total positive nodes: 5 Histologic grade (G): G1 Residual tumor (R): R0 - None Stage prefix: Initial diagnosis Lymphatic vessel invasion (L): L0 - No lymphatic vessel invasion Venous invasion (V): V0 - No venous invasion Prognostic indicators: ER/PR pos, HER 2 neg., Rx with AC x 3 mo, then Taxol x 3 mo.,AI x 10 yrs   04/04/2023 - 06/27/2023 Chemotherapy   Patient  is on Treatment Plan : BREAST Fulvestrant q28d     Malignant neoplasm metastatic to bone (HCC)  11/08/2022 Initial Diagnosis   Malignant neoplasm metastatic to bone (HCC)   04/04/2023 - 06/27/2023 Chemotherapy   Patient is on Treatment Plan : BREAST Fulvestrant q28d       INTERVAL HISTORY:  Lorre is here today for repeat clinical assessment of recurrent hormone receptor positive breast cancer metastatic to liver, bone and lung/pleura which was found in December, 2022. She was placed on Anastrozole and Palbociclib in January, 2023 for over 1 year. 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 Repeat scans in June, 2024 once again showed progression so she has been changed to faslodex injections monthly and Verzenio oral chemotherapy at 100 mg twice daily. She has had significant toxicities despite dose reduction with severe worsening of appetite associated with nausea, weakness, dehydration, and diarrhea. We stopped it and she continues to do poorly with worsening hyponatremia. Patient states that she feels weak but complains of severe shortness of breath, hot flashes, and a cough with yellow phlegm. She just began taking Anastrozole last week. During a short walk her O2 saturation was 94 but her heart rate continued to stay in the 120's. During physical exam I heard decreased breath sound throughout the entire right lung and enlargement of her liver with worsening liver function tests. I believe her cancer to be progressing fast.  I believe she will need her lungs tapped and if she does this will be her third time. I informed her of a pleurX catheter and informed her she may be able to tap fluid from her lungs at home. I informed them about the option of Hospice and the care they will provide as this will assist her medical needs. I will refer her to Hospice so her and her family can further  discuss options with them. Dr. Denny Levy is able to tap fluids from her lungs today but is not able to put in a pleurX so we will probably have to schedule this later. Her WBC is 8.4, hemoglobin is 13.1, and platelet count is 386,000 as of today. She has a elevated BUN of 34, AST of 245, ALT of 126, and alkaline phosphate of 307. I will prescribe Augmentin 875 mg twice daily, and see her back in 1-2 weeks with CBC  and CMP.    She denies signs of infection such as sore throat, sinus drainage, or urinary symptoms.  She denies fevers or recurrent chills. She denies pain. She denies nausea, vomiting, chest pain, dyspnea. Her appetite is not the best and her weight has decreased 6 pounds over last 4 weeks . She is accompanied at today's visit with her husband and daughter.   REVIEW OF SYSTEMS:  Review of Systems  Constitutional:  Positive for fatigue. Negative for appetite change, chills, diaphoresis, fever and unexpected weight change.  HENT:  Negative.  Negative for hearing loss, lump/mass, mouth sores, nosebleeds, sore throat, tinnitus, trouble swallowing and voice change.   Eyes: Negative.  Negative for eye problems and icterus.  Respiratory:  Positive for cough (with yellow phlegm) and shortness of breath (severe). Negative for chest tightness, hemoptysis and wheezing.   Cardiovascular: Negative.  Negative for chest pain, leg swelling and palpitations.  Gastrointestinal:  Positive for abdominal distention. Negative for abdominal pain, blood in stool, constipation, diarrhea, nausea, rectal pain and vomiting.  Endocrine: Positive for hot flashes.  Genitourinary: Negative.  Negative for bladder incontinence, difficulty urinating, dyspareunia, dysuria, frequency, hematuria, menstrual problem, nocturia, pelvic pain, vaginal bleeding and vaginal discharge.   Musculoskeletal: Negative.  Negative for arthralgias, back pain, flank pain, gait problem, myalgias, neck pain and neck stiffness.  Skin: Negative.   Negative for itching, rash and wound.  Neurological:  Negative for dizziness, extremity weakness, gait problem, headaches, light-headedness, numbness, seizures and speech difficulty.  Hematological: Negative.  Negative for adenopathy. Does not bruise/bleed easily.  Psychiatric/Behavioral: Negative.  Negative for confusion, decreased concentration, depression, sleep disturbance and suicidal ideas. The patient is not nervous/anxious.    VITALS:  There were no vitals taken for this visit.  Wt Readings from Last 3 Encounters:  07/31/23 137 lb (62.1 kg)  07/09/23 143 lb 11.2 oz (65.2 kg)  07/02/23 145 lb 12.8 oz (66.1 kg)    There is no height or weight on file to calculate BMI.  Performance status (ECOG): 2 - Symptomatic, <50% confined to bed  PHYSICAL EXAM:  Physical Exam Vitals and nursing note reviewed. Exam conducted with a chaperone present.  Constitutional:      General: She is not in acute distress.    Appearance: Normal appearance. She is normal weight. She is not ill-appearing, toxic-appearing or diaphoretic.     Comments: Ambulates with a cane  HENT:     Head: Normocephalic and atraumatic.     Right Ear: Tympanic membrane, ear canal and external ear normal. There is no impacted cerumen.     Left Ear: Tympanic membrane, ear canal and external ear normal. There is no impacted cerumen.     Nose: Nose normal. No congestion or rhinorrhea.     Mouth/Throat:     Mouth: Mucous membranes are moist.     Pharynx: Oropharynx is clear. No oropharyngeal exudate or posterior oropharyngeal erythema.  Eyes:     General: No scleral icterus.       Right eye: No discharge.        Left eye: No discharge.     Extraocular Movements: Extraocular movements intact.     Conjunctiva/sclera: Conjunctivae normal.     Pupils: Pupils are equal, round, and reactive to light.  Neck:     Vascular: No carotid bruit.  Cardiovascular:     Rate and Rhythm: Regular rhythm. Tachycardia present.     Pulses:  Normal pulses.     Heart sounds: Normal heart sounds. No  murmur heard.    No friction rub. No gallop.  Pulmonary:     Effort: Pulmonary effort is normal. No respiratory distress.     Breath sounds: No stridor. Examination of the right-upper field reveals decreased breath sounds. Examination of the right-middle field reveals decreased breath sounds. Examination of the right-lower field reveals decreased breath sounds. Decreased breath sounds present. No wheezing, rhonchi or rales.  Chest:     Chest wall: No tenderness.  Abdominal:     General: Bowel sounds are normal. There is distension.     Palpations: Abdomen is soft. There is hepatomegaly. There is no splenomegaly or mass.     Tenderness: There is no abdominal tenderness. There is no right CVA tenderness, left CVA tenderness, guarding or rebound.     Hernia: No hernia is present.     Comments: Her liver is enlarged to 12-13cm below the right costal margin with multiple nodule tumors of the left lobe.  She does have abdominal distention and possibly early ascites.   Musculoskeletal:        General: No swelling, tenderness, deformity or signs of injury. Normal range of motion.     Cervical back: Normal range of motion and neck supple. No rigidity or tenderness.     Right lower leg: No edema.     Left lower leg: No edema.  Lymphadenopathy:     Cervical: No cervical adenopathy.     Right cervical: No superficial, deep or posterior cervical adenopathy.    Left cervical: No superficial, deep or posterior cervical adenopathy.     Upper Body:     Right upper body: No supraclavicular, axillary or pectoral adenopathy.     Left upper body: No supraclavicular, axillary or pectoral adenopathy.  Skin:    General: Skin is warm and dry.     Coloration: Skin is not jaundiced or pale.     Findings: No bruising, erythema, lesion or rash.  Neurological:     General: No focal deficit present.     Mental Status: She is alert and oriented to person,  place, and time. Mental status is at baseline.     Cranial Nerves: No cranial nerve deficit.     Sensory: No sensory deficit.     Motor: No weakness.     Coordination: Coordination normal.     Gait: Gait normal.     Deep Tendon Reflexes: Reflexes normal.  Psychiatric:        Mood and Affect: Mood normal.        Behavior: Behavior normal.        Thought Content: Thought content normal.        Judgment: Judgment normal.    LABS:      Latest Ref Rng & Units 07/31/2023   12:00 AM 07/18/2023   12:00 AM 07/09/2023    1:21 PM  CBC  WBC  7.1       7.1     5.1     3.9      Hemoglobin 12.0 - 16.0 12.0 - 16.0 12.7       12.7     11.3     10.0      Hematocrit 36 - 46 36 - 46 38       38     32     29      Platelets 150 - 400 K/uL 150 - 400 K/uL 347       347     427  320         This result is from an external source.   Multiple values from one day are sorted in reverse-chronological order      Latest Ref Rng & Units 07/31/2023   12:00 AM 07/18/2023   12:00 AM 07/09/2023    1:21 PM  CMP  BUN 4 - 21 4 - 21 36       36     12     11      Creatinine 0.5 - 1.1 0.5 - 1.1 1.0       1.0     0.6     0.7      Sodium 137 - 147 137 - 147 141       141     116     123      Potassium 3.5 - 5.1 mEq/L 3.5 - 5.1 mEq/L 4.1       4.1     4.2     4.7      Chloride 99 - 108 99 - 108 102       102     84     89      CO2 13 - 22 13 - 22 33       33     26     30      Calcium 8.7 - 10.7 8.7 - 10.7 10.1       10.1     8.9     9.1      Alkaline Phos 25 - 125 25 - 125 272       272     197     185      AST 13 - 35 13 - 35 149       149     95     64      ALT 7 - 35 U/L 7 - 35 U/L 95       95     51     32         This result is from an external source.   Multiple values from one day are sorted in reverse-chronological order     Lab Results  Component Value Date   CEA1 1.6 10/11/2021   /  CEA  Date Value Ref Range Status  10/11/2021 1.6 0.0 - 4.7 ng/mL Final     Comment:    (NOTE)                             Nonsmokers          <3.9                             Smokers             <5.6 [employer] Electrochemiluminescence Immunoassay (ECLIA) Values obtained with different assay methods or kits cannot be used interchangeably.  Results cannot be interpreted as absolute evidence of the presence or absence of malignant disease. Performed At: [facility] [address] 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  Report date is 11/29/21.   The CustomNext-Cancer+RNAinsight panel offered by [facility] includes sequencing and rearrangement analysis for the following 47 genes:  APC, ATM, AXIN2, BARD1, BMPR1A, BRCA1, BRCA2, BRIP1, CDH1, CDK4, CDKN2A, CHEK2, DICER1, EPCAM, GREM1, HOXB13,    History of left breast cancer 06/13/2021   Hypotension 06/13/2021   Hypothyroidism (acquired) 03/18/2023   Incisional hernia, without obstruction  or gangrene 10/22/2016   Lymphedema of left upper extremity 06/12/2022   Malignant neoplasm metastatic to bone (HCC) 11/08/2022   Malignant neoplasm metastatic to liver (HCC) 11/08/2022   Malignant neoplasm of upper-outer quadrant of left female breast (HCC) 03/07/2006   Mixed dyslipidemia 11/03/2018   Numbness in right leg    Hx: of   Obesity (BMI 30.0-34.9) 09/21/2020   Other specified disorders of bone density and structure, multiple sites 09/20/2020   Paroxysmal atrial fibrillation (HCC) 02/13/2016   Personal history of COVID-19 09/2019   Pleural effusion, 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   Pneumonia    Hx: of 'a long time ago"   Postoperative examination 02/05/2016    Past Surgical History:  Procedure Laterality Date   APPENDECTOMY     BACK SURGERY     BREAST SURGERY     Hx: of lumpectomy left breast 2007   COLONOSCOPY     Hx: of   DILATION AND CURETTAGE OF UTERUS     TONSILLECTOMY     TOTAL HIP ARTHROPLASTY Right 04/13/2013   Dr Magnus Ivan   TOTAL HIP ARTHROPLASTY Right 04/13/2013   Procedure: RIGHT TOTAL HIP ARTHROPLASTY ANTERIOR APPROACH;  Surgeon: Kathryne Hitch, MD;  Location: [facility] OR;  Service: Orthopedics;  Laterality: Right;   TUBAL LIGATION      Family History  Problem Relation Age of Onset   Hypertension Sister    Cancer Sister 70       uterine or ovarian   Bone cancer Sister 41   Breast cancer Sister        dx 75 and 4   Uterine cancer Sister 52   Colon cancer Sister 58   Breast cancer Sister 74   Hypertension Brother    Kidney cancer Brother 94   Cancer Brother 70       unknown type; mets   Liver cancer Brother    Liver cancer Brother 57   Pancreatic cancer Niece 49   Breast cancer Niece 63   Colon cancer Nephew 57       mets    Social History:  reports that she has never smoked. She has never used smokeless tobacco. She reports that she does not drink alcohol and does not use drugs.The patient is accompanied by her husband and daughter  today.  Allergies:  Allergies  Allergen Reactions  Codeine     unknown    Sulfa Antibiotics     unknown   Sulfacetamide Sodium Other (See Comments)    unknown   Chlorhexidine Rash    Current Medications: Current Outpatient Medications  Medication Sig Dispense Refill   apixaban (ELIQUIS) 5 MG TABS tablet TAKE 1 TABLET BY MOUTH TWICE A DAY 180 tablet 1   atorvastatin (LIPITOR) 10 MG tablet Take 1 tablet by mouth every other day.  4   calcium carbonate (OSCAL) 1500 (600 Ca) MG TABS tablet Take 600 mg of elemental calcium by mouth daily with breakfast.     cholecalciferol (VITAMIN D) 1000 UNITS tablet Take 1,000 Units by mouth daily.     denosumab (XGEVA) 120 MG/1.7ML SOLN injection Inject 120 mg into the skin as directed. Once every 4 weeks     exemestane (AROMASIN) 25 MG tablet Take 1 tablet (25 mg total) by mouth daily after breakfast. (Patient not taking: Reported on 07/31/2023) 30 tablet 5   Flaxseed, Linseed, OIL Take 1 capsule by mouth daily.     glucosamine-chondroitin 500-400 MG tablet Take 1 tablet by mouth 2 (two) times daily.      levothyroxine (SYNTHROID, LEVOTHROID) 75 MCG tablet Take 75 mcg by mouth daily.   7   metoprolol tartrate (LOPRESSOR) 25 MG tablet Take 1 tablet (25 mg total) by mouth 2 (two) times daily. 180 tablet 2   Multiple Vitamin (MULTIVITAMIN WITH MINERALS) TABS Take 1 tablet by mouth daily.     ondansetron (ZOFRAN) 4 MG tablet Take 1 tablet (4 mg total) by mouth every 4 (four) hours as needed for nausea or vomiting. 30 tablet 5   prochlorperazine (COMPAZINE) 10 MG tablet Take 1 tablet (10 mg total) by mouth every 6 (six) hours as needed for nausea or vomiting. 30 tablet 5   No current facility-administered medications for this visit.     I,Jasmine M Lassiter,acting as a scribe for Dellia Beckwith, MD.,have documented all relevant documentation on the behalf of Dellia Beckwith, MD,as directed by  Dellia Beckwith, MD while in the presence of  Dellia Beckwith, MD.

## 2023-08-07 NOTE — Telephone Encounter (Signed)
Attempted to reach patient for a scheduled remote nutrition consult. Provided my cell# on voice mail to return call for her follow up nutrition consult.  Cyndi Helder Crisafulli, RDN, LDN Registered Dietitian, [facility] Part Time Remote (Usual office hours: Tuesday-Thursday) Cell: [phone]    

## 2023-08-11 ENCOUNTER — Telehealth: Payer: Self-pay

## 2023-08-11 NOTE — Telephone Encounter (Signed)
Carrie Wilkerson with [facility] notified.

## 2023-08-11 NOTE — Telephone Encounter (Signed)
-----   Message from Dellia Beckwith sent at 08/07/2023  7:19 PM EDT ----- Regarding: ref Hospice She is still not sure she wants hospice but they are coming around. 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  She is on Aromasin 25 mg daily so I explained we would stop that but she could take bottle she has now.  She has large right pleural effusion, very symptomatic and they tapped 1200 cc today. I think she would benefit from a pleurX catheter but I don't know if new radiology group at [facility] can do, or if she will live long enough to be worth it.  She has been tapped twice before but a long time ago. She may need again.  I am placing her on ab since coughing up yellow sputum. Will go with Augmentin. Fortunately no pain She is very quiet but mentally intact.  Her daughter has been much more involved now  [phone]  So need hospice to go out and discuss their services, she will definitely need them eventually, I am encouraging now and would benefit from O2.  I would be glad to be attending and will see occasionally until she is unable.

## 2023-08-12 NOTE — Progress Notes (Signed)
[facility]  [address] 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  Estrogen and progesterone receptors were positive. HER2 negative.  She initially responded to palbociclib/letrozole with a decrease in the CA 27-29, as well as a decrease in the pleural effusion and no new disease on CT in July 2023.    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  Repeat imaging in June unfortunately showed progressive metastasis within the liver.  She was then placed on abemaciclib 100 mg twice daily with fulvestrant injections monthly in June.   Due to difficulty with poor appetite, taste changes and weight loss, as well as new hyponatremia, abemaciclib was decreased to 100 mg daily, then ultimately discontinued. CT chest, abdomen and pelvis in September revealed mild increase in size of dominant hepatic metastasis in right lobe. Other diffuse small hepatic metastases show no significant change. Increased 3 mm right lower lobe pulmonary nodule and stable 4 mm right middle lobe nodule. Pulmonary metastases cannot be excluded. Stable large right pleural effusion and tiny left pleural effusion.  We discussed switching oral therapy to everolimus/exemestane.  She was hospitalized in October with severe hyponatremia.  When she recovered from that, she agreed to try the exemestane, but preferred to not take the oral chemotherapy.  She then developed worsening right pleural effusion last week requiring  urgent thoracentesis she had removal of 400 cc of pleural fluid.  Cytology was positive for malignant cells.  She states her shortness of breath improved with the thoracentesis does not seem to be rapidly worsening.  I would still recommend obtaining a chest x-ray early next week and if she has rapid reaccumulation plan for Pleurx catheter.  She had a family meeting with hospice on Monday to discuss their services.  I will plan to see her back in 1 week with a CBC and comprehensive metabolic panel for continued supportive care.  Malignant neoplasm metastatic to liver [facility]) Hepatic metastasis with slow progression over the last several months.  She is currently on palliative exemestane.  The liver enzymes have improved.  Hyponatremia Her sodium is drifting down, but remains normal.  We will continue to monitor this    The patient understands the plans discussed today and is in agreement with them.  She knows to contact our office if she develops concerns prior to her next appointment.   I provided 40 minutes of face-to-face time during this encounter and > 50% was spent counseling as documented under my assessment and plan.    Carrie Perl, PA-C  [facility] - A DEPT OF [facility] [address] [place] [address] Dept: [phone] Dept Fax: [phone]   No orders of the defined types were placed in this encounter.     CHIEF COMPLAINT:  CC: Metastatic hormone receptor positive breast cancer  Current Treatment: Exemestane 25 mg daily  HISTORY OF PRESENT ILLNESS:   Oncology History  Malignant neoplasm of upper-outer quadrant of left female breast (HCC)  03/07/2006 Initial Diagnosis  Breast cancer, left breast (HCC)   03/17/2006 Cancer Staging   Staging form: Breast, AJCC 6th Edition - Clinical stage from 03/17/2006: Stage IIIA (T1c, N2a, M0) - Signed by Dellia Beckwith, MD on 12/15/2020 Staged by: Managing  physician Diagnostic confirmation: Positive histology Specimen type: Excision Histopathologic type: Infiltrating duct carcinoma, NOS Tumor size (mm): 15 Laterality: Left Total positive nodes: 5 Histologic grade (G): G1 Residual tumor (R): R0 - None Stage prefix: Initial diagnosis Lymphatic vessel invasion (L): L0 - No lymphatic vessel invasion Venous invasion (V): V0 - No venous invasion Prognostic indicators: ER/PR pos, HER 2 neg., Rx with AC x 3 mo, then Taxol x 3 mo.,AI x 10 yrs   04/04/2023 - 06/27/2023 Chemotherapy   Patient is on Treatment Plan : BREAST Fulvestrant q28d     Malignant neoplasm metastatic to bone (HCC)  11/08/2022 Initial Diagnosis   Malignant neoplasm metastatic to bone (HCC)   04/04/2023 - 06/27/2023 Chemotherapy   Patient is on Treatment Plan : BREAST Fulvestrant q28d         INTERVAL HISTORY:  Carrie Wilkerson is here today for continued supportive care.  She states her breathing has improved after thoracentesis last week.  She had removal of 1200 cc.  Cytology was positive.  She denies chest pain.  She states her cough has improved with Augmentin.  She had episode of diarrhea yesterday and today, likely related to the antibiotic.  Imodium was effective.  She has occasional nausea, but denies vomiting.  She denies fevers or chills. She denies pain. Her appetite is decreased, but she is trying to drink Ensure regularly.  Chocolate Ensure has been giving her heartburn.  I gave her some different flavors to try today. Her weight has decreased 14 pounds over last 1 week, some of this is fluid .  She is not very active spending more time in bed and sleeping.  Her daughter states hospice is going to come and speak with them on Monday.  REVIEW OF SYSTEMS:  Review of Systems  Constitutional:  Positive for appetite change, fatigue and unexpected weight change. Negative for chills and fever.  HENT:   Negative for lump/mass, mouth sores, sore throat and trouble swallowing.    Respiratory:  Positive for cough and shortness of breath.   Cardiovascular:  Negative for chest pain and leg swelling.  Gastrointestinal:  Positive for diarrhea (last night and this am, resolved with Imodium) and nausea (Occasional). Negative for abdominal pain, constipation and vomiting.  Endocrine: Positive for hot flashes.  Genitourinary:  Negative for difficulty urinating, dysuria, frequency, hematuria and vaginal bleeding.   Musculoskeletal:  Positive for gait problem. Negative for arthralgias, back pain and myalgias.  Skin:  Negative for itching and rash.  Neurological:  Positive for gait problem. Negative for dizziness, headaches and light-headedness.  Hematological:  Negative for adenopathy. Does not bruise/bleed easily.  Psychiatric/Behavioral:  Negative for depression and sleep disturbance. The patient is not nervous/anxious.      VITALS:  Blood pressure (!) 71/51, pulse (!) 104, temperature 97.6 F (36.4 C), temperature source Oral, resp. rate 18, height 5\' 2"  (1.575 m), weight 123 lb 1.6 oz (55.8 kg), SpO2 96%.  Wt Readings from Last 3 Encounters:  08/14/23 123 lb 1.6 oz (55.8 kg)  08/07/23 137 lb 11.2 oz (62.5 kg)  07/31/23 137 lb (62.1 kg)    Body mass index is 22.52 kg/m.  Performance status (ECOG): 3 - Symptomatic, >50% confined to bed  PHYSICAL EXAM:  Physical Exam Vitals and nursing  note reviewed.  Constitutional:      General: She is not in acute distress.    Appearance: She is ill-appearing (Chronically ill-appearing).     Comments: She is in a wheelchair  HENT:     Head: Normocephalic and atraumatic.     Mouth/Throat:     Mouth: Mucous membranes are moist.     Pharynx: Oropharynx is clear. No oropharyngeal exudate or posterior oropharyngeal erythema.  Eyes:     General: No scleral icterus.    Extraocular Movements: Extraocular movements intact.     Conjunctiva/sclera: Conjunctivae normal.     Pupils: Pupils are equal, round, and reactive to light.   Cardiovascular:     Rate and Rhythm: Normal rate and regular rhythm.     Heart sounds: Normal heart sounds. No murmur heard.    No friction rub. No gallop.  Pulmonary:     Effort: Pulmonary effort is normal.     Breath sounds: Normal breath sounds. No wheezing, rhonchi or rales.  Chest:     Comments: Breast exam is deferred Abdominal:     General: There is no distension.     Palpations: Abdomen is soft. There is no mass.     Tenderness: There is no abdominal tenderness.  Musculoskeletal:        General: Normal range of motion.     Cervical back: Normal range of motion and neck supple. No tenderness.     Right lower leg: No edema.     Left lower leg: No edema.  Lymphadenopathy:     Cervical: No cervical adenopathy.     Upper Body:     Right upper body: No supraclavicular or axillary adenopathy.     Left upper body: No supraclavicular or axillary adenopathy.  Skin:    General: Skin is warm and dry.     Coloration: Skin is not jaundiced.     Findings: No rash.  Neurological:     Mental Status: She is alert and oriented to person, place, and time.     Cranial Nerves: No cranial nerve deficit.  Psychiatric:        Mood and Affect: Mood normal.        Behavior: Behavior normal.        Thought Content: Thought content normal.     LABS:      Latest Ref Rng & Units 08/14/2023    1:53 PM 08/07/2023   12:00 AM 07/31/2023   12:00 AM  CBC  WBC 4.0 - 10.5 K/uL 8.3  8.4     7.1       7.1      Hemoglobin 12.0 - 15.0 g/dL 24.4  01.0     27.2       12.7      Hematocrit 36.0 - 46.0 % 41.7  39     38       38      Platelets 150 - 400 K/uL 344  386     347       347         This result is from an external source.   Multiple values from one day are sorted in reverse-chronological order      Latest Ref Rng & Units 08/14/2023    1:53 PM 08/07/2023   12:00 AM 07/31/2023   12:00 AM  CMP  Glucose 70 - 99 mg/dL 536     BUN 8 - 23 mg/dL 20  24  36       36      Creatinine 0.44  - 1.00 mg/dL 4.09  0.9     1.0       1.0      Sodium 135 - 145 mmol/L 137  140     141       141      Potassium 3.5 - 5.1 mmol/L 4.3  4.0     4.1       4.1      Chloride 98 - 111 mmol/L 98  102     102       102      CO2 22 - 32 mmol/L 25  34     33       33      Calcium 8.9 - 10.3 mg/dL 8.5  9.3     81.1       10.1      Total Protein 6.5 - 8.1 g/dL 6.7     Total Bilirubin <1.2 mg/dL 1.1     Alkaline Phos 38 - 126 U/L 353  307     272       272      AST 15 - 41 U/L 163  245     149       149      ALT 0 - 44 U/L 94  126     95       95         This result is from an external source.   Multiple values from one day are sorted in reverse-chronological order     Lab Results  Component Value Date   CEA1 1.6 10/11/2021   /  CEA  Date Value Ref Range Status  10/11/2021 1.6 0.0 - 4.7 ng/mL Final    Comment:    (NOTE)                             Nonsmokers          <3.9                             Smokers             <5.6 Roche [employer] Electrochemiluminescence Immunoassay (ECLIA) Values obtained with different assay methods or kits cannot be used interchangeably.  Results cannot be interpreted as absolute evidence of the presence or absence of malignant disease. Performed At: [facility] [address] [place], [place] [id number] Jolene Schimke MD OZ:[id number]     Lab Results  Component Value Date   FERRITIN 387 (H) 07/02/2023     STUDIES:    Exam(s): [id number] US/US GUIDE NEEDLE - [place] THORA  INDICATION:  Metastatic breast cancer, recurrent large right pleural effusion,  shortness of breath  EXAM:  ULTRASOUND GUIDED RIGHT THORACENTESIS  MEDICATIONS:  1% lidocaine local  COMPLICATIONS:  None immediate.  PROCEDURE:  An ultrasound guided thoracentesis was thoroughly discussed with the  patient and questions answered. The benefits, risks, alternatives  and complications were also discussed. The patient understands and  wishes to proceed with the  procedure. Written consent was obtained.  Ultrasound was performed to localize and mark an adequate pocket of  fluid in the right chest. The area was then prepped and draped in  the normal sterile fashion. 1% Lidocaine was used for local  anesthesia.  Under ultrasound guidance a 6 Fr Safe-T-Centesis  catheter was introduced. Thoracentesis was performed. The catheter  was removed and a dressing applied.  FINDINGS:  A total of approximately 1.2 L of yellow serosanguineous fluid was  removed. Samples were sent to the laboratory as requested by the  clinical team.  IMPRESSION:  Successful ultrasound guided left thoracentesis yielding 1.2 L of  pleural fluid.      Exam(s): [id number] RAD/DG CXRPP 1V - CHEST POST PROC  CLINICAL DATA: Large right effusion  EXAM:  CHEST 1 VIEW  COMPARISON: 07/19/2023  FINDINGS:  Decrease in the right effusion following 1.2 L thoracentesis.  Moderate right effusion remains with mid and lower lung  collapse/consolidation. Stable left lung aeration. No pneumothorax.  Stable heart size and vascularity. Atherosclerotic. Degenerative  changes and scoliosis of the spine.  IMPRESSION:  Decrease in the right effusion following thoracentesis. No  pneumothorax.    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  Report date is 11/29/21.   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) 11/08/2022   Malignant neoplasm of upper-outer quadrant of left female breast (HCC) 03/07/2006   Mixed dyslipidemia 11/03/2018   Numbness in right leg    Hx: of   Obesity (BMI 30.0-34.9) 09/21/2020   Other specified disorders of bone density and structure, multiple sites 09/20/2020   Paroxysmal atrial fibrillation (HCC) 02/13/2016   Personal history of COVID-19 09/2019   Pleural effusion, 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   Pneumonia    Hx: of 'a long time ago"   Postoperative examination 02/05/2016    Past Surgical History:  Procedure Laterality Date   APPENDECTOMY     BACK SURGERY     BREAST SURGERY     Hx: of lumpectomy left breast 2007   COLONOSCOPY     Hx: of   DILATION AND CURETTAGE OF UTERUS     TONSILLECTOMY     TOTAL HIP ARTHROPLASTY Right 04/13/2013   Dr Magnus Ivan   TOTAL HIP ARTHROPLASTY Right 04/13/2013   Procedure: RIGHT TOTAL HIP ARTHROPLASTY ANTERIOR APPROACH;  Surgeon: Kathryne Hitch, MD;  Location: [facility] OR;  Service: Orthopedics;  Laterality: Right;   TUBAL LIGATION      Family History  Problem Relation Age of Onset   Hypertension Sister    Cancer Sister 67       uterine or ovarian   Bone cancer Sister 60   Breast cancer Sister  dx 41 and 58   Uterine cancer Sister 85   Colon cancer Sister 54   Breast cancer Sister 57   Hypertension Brother    Kidney cancer Brother 51   Cancer Brother 41       unknown type; mets    Liver cancer Brother    Liver cancer Brother 70   Pancreatic cancer Niece 69   Breast cancer Niece 37   Colon cancer Nephew 57       mets    Social History:  reports that she has never smoked. She has never used smokeless tobacco. She reports that she does not drink alcohol and does not use drugs.The patient is accompanied by her daughter and husband today.  Allergies:  Allergies  Allergen Reactions   Codeine     unknown    Sulfa Antibiotics     unknown   Sulfacetamide Sodium Other (See Comments)    unknown   Chlorhexidine Rash    Current Medications: Current Outpatient Medications  Medication Sig Dispense Refill   exemestane (AROMASIN) 25 MG tablet Take 1 tablet (25 mg total) by mouth daily after breakfast. 30 tablet 5   amoxicillin-clavulanate (AUGMENTIN) 875-125 MG tablet Take 1 tablet by mouth 2 (two) times daily for 10 days. 20 tablet 0   apixaban (ELIQUIS) 5 MG TABS tablet TAKE 1 TABLET BY MOUTH TWICE A DAY 180 tablet 1   atorvastatin (LIPITOR) 10 MG tablet Take 1 tablet by mouth every other day.  4   calcium carbonate (OSCAL) 1500 (600 Ca) MG TABS tablet Take 600 mg of elemental calcium by mouth daily with breakfast.     cholecalciferol (VITAMIN D) 1000 UNITS tablet Take 1,000 Units by mouth daily.     denosumab (XGEVA) 120 MG/1.7ML SOLN injection Inject 120 mg into the skin as directed. Once every 4 weeks     Flaxseed, Linseed, OIL Take 1 capsule by mouth daily.     glucosamine-chondroitin 500-400 MG tablet Take 1 tablet by mouth 2 (two) times daily.      levothyroxine (SYNTHROID, LEVOTHROID) 75 MCG tablet Take 75 mcg by mouth daily.   7   metoprolol tartrate (LOPRESSOR) 25 MG tablet Take 1 tablet (25 mg total) by mouth 2 (two) times daily. 180 tablet 2   Multiple Vitamin (MULTIVITAMIN WITH MINERALS) TABS Take 1 tablet by mouth daily.     ondansetron (ZOFRAN) 4 MG tablet Take 1 tablet (4 mg total) by mouth every 4 (four) hours as needed for nausea or vomiting. 30 tablet  5   prochlorperazine (COMPAZINE) 10 MG tablet Take 1 tablet (10 mg total) by mouth every 6 (six) hours as needed for nausea or vomiting. 30 tablet 5   No current facility-administered medications for this visit.

## 2023-08-13 ENCOUNTER — Other Ambulatory Visit: Payer: Self-pay

## 2023-08-13 NOTE — Progress Notes (Signed)
Patient did not begin everolimus therapy. Confirmed with Katie and disenrolled.

## 2023-08-14 ENCOUNTER — Inpatient Hospital Stay: Payer: Medicare HMO | Attending: Hematology and Oncology

## 2023-08-14 ENCOUNTER — Inpatient Hospital Stay: Payer: Medicare HMO | Admitting: Hematology and Oncology

## 2023-08-14 ENCOUNTER — Encounter: Payer: Self-pay | Admitting: Hematology and Oncology

## 2023-08-14 VITALS — BP 71/51 | HR 104 | Temp 97.6°F | Resp 18 | Ht 62.0 in | Wt 123.1 lb

## 2023-08-14 DIAGNOSIS — C7800 Secondary malignant neoplasm of unspecified lung: Secondary | ICD-10-CM | POA: Insufficient documentation

## 2023-08-14 DIAGNOSIS — C787 Secondary malignant neoplasm of liver and intrahepatic bile duct: Secondary | ICD-10-CM

## 2023-08-14 DIAGNOSIS — Z803 Family history of malignant neoplasm of breast: Secondary | ICD-10-CM | POA: Insufficient documentation

## 2023-08-14 DIAGNOSIS — C50412 Malignant neoplasm of upper-outer quadrant of left female breast: Secondary | ICD-10-CM | POA: Diagnosis present

## 2023-08-14 DIAGNOSIS — Z79899 Other long term (current) drug therapy: Secondary | ICD-10-CM | POA: Insufficient documentation

## 2023-08-14 DIAGNOSIS — C7951 Secondary malignant neoplasm of bone: Secondary | ICD-10-CM | POA: Diagnosis present

## 2023-08-14 DIAGNOSIS — E871 Hypo-osmolality and hyponatremia: Secondary | ICD-10-CM | POA: Insufficient documentation

## 2023-08-14 DIAGNOSIS — Z800 Family history of malignant neoplasm of digestive organs: Secondary | ICD-10-CM | POA: Insufficient documentation

## 2023-08-14 DIAGNOSIS — J910 Malignant pleural effusion: Secondary | ICD-10-CM | POA: Insufficient documentation

## 2023-08-14 LAB — CBC WITH DIFFERENTIAL (CANCER CENTER ONLY)
Abs Immature Granulocytes: 0.03 10*3/uL (ref 0.00–0.07)
Basophils Absolute: 0 10*3/uL (ref 0.0–0.1)
Basophils Relative: 1 %
Eosinophils Absolute: 0 10*3/uL (ref 0.0–0.5)
Eosinophils Relative: 1 %
HCT: 41.7 % (ref 36.0–46.0)
Hemoglobin: 13.6 g/dL (ref 12.0–15.0)
Immature Granulocytes: 0 %
Lymphocytes Relative: 18 %
Lymphs Abs: 1.5 10*3/uL (ref 0.7–4.0)
MCH: 31.9 pg (ref 26.0–34.0)
MCHC: 32.6 g/dL (ref 30.0–36.0)
MCV: 97.9 fL (ref 80.0–100.0)
Monocytes Absolute: 0.8 10*3/uL (ref 0.1–1.0)
Monocytes Relative: 9 %
Neutro Abs: 6 10*3/uL (ref 1.7–7.7)
Neutrophils Relative %: 71 %
Platelet Count: 344 10*3/uL (ref 150–400)
RBC: 4.26 MIL/uL (ref 3.87–5.11)
RDW: 14.4 % (ref 11.5–15.5)
WBC Count: 8.3 10*3/uL (ref 4.0–10.5)
nRBC: 0 % (ref 0.0–0.2)
nRBC: 0 /100{WBCs}

## 2023-08-14 LAB — CMP (CANCER CENTER ONLY)
ALT: 94 U/L — ABNORMAL HIGH (ref 0–44)
AST: 163 U/L — ABNORMAL HIGH (ref 15–41)
Albumin: 3.4 g/dL — ABNORMAL LOW (ref 3.5–5.0)
Alkaline Phosphatase: 353 U/L — ABNORMAL HIGH (ref 38–126)
Anion gap: 15 (ref 5–15)
BUN: 20 mg/dL (ref 8–23)
CO2: 25 mmol/L (ref 22–32)
Calcium: 8.5 mg/dL — ABNORMAL LOW (ref 8.9–10.3)
Chloride: 98 mmol/L (ref 98–111)
Creatinine: 1.12 mg/dL — ABNORMAL HIGH (ref 0.44–1.00)
GFR, Estimated: 48 mL/min — ABNORMAL LOW (ref >60)
Glucose, Bld: 107 mg/dL — ABNORMAL HIGH (ref 70–99)
Potassium: 4.3 mmol/L (ref 3.5–5.1)
Sodium: 137 mmol/L (ref 135–145)
Total Bilirubin: 1.1 mg/dL (ref <1.2)
Total Protein: 6.7 g/dL (ref 6.5–8.1)

## 2023-08-14 NOTE — Assessment & Plan Note (Signed)
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  Estrogen and progesterone receptors were positive. HER2 negative.  She initially responded to palbociclib/letrozole with a decrease in the CA 27-29, as well as a decrease in the pleural effusion and no new disease on CT in July 2023.    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  Repeat imaging in June unfortunately showed progressive metastasis within the liver.  She was then placed on abemaciclib 100 mg twice daily with fulvestrant injections monthly in June.   Due to difficulty with poor appetite, taste changes and weight loss, as well as new hyponatremia, abemaciclib was decreased to 100 mg daily, then ultimately discontinued. CT chest, abdomen and pelvis in September revealed mild increase in size of dominant hepatic metastasis in right lobe. Other diffuse small hepatic metastases show no significant change. Increased 3 mm right lower lobe pulmonary nodule and stable 4 mm right middle lobe nodule. Pulmonary metastases cannot be excluded. Stable large right pleural effusion and tiny left pleural effusion.  We discussed switching oral therapy to everolimus/exemestane.  She was hospitalized in October with severe hyponatremia.  When she recovered from that, she agreed to try the exemestane, but preferred to not take the oral chemotherapy.  She then developed worsening right pleural effusion last week requiring urgent thoracentesis she had removal of 400 cc of pleural fluid.  Cytology was positive for malignant cells.  She states her shortness of breath improved with the thoracentesis does not seem to be rapidly worsening.  I would still recommend  obtaining a chest x-ray early next week and if she has rapid reaccumulation plan for Pleurx catheter.  She had a family meeting with hospice on Monday to discuss their services.  I will plan to see her back in 1 week with a CBC and comprehensive metabolic panel for continued supportive care.

## 2023-08-14 NOTE — Assessment & Plan Note (Addendum)
Hepatic metastasis with slow progression over the last several months.  She is currently on palliative exemestane.  The liver enzymes have improved.

## 2023-08-14 NOTE — Assessment & Plan Note (Signed)
Her sodium is drifting down, but remains normal.  We will continue to monitor this

## 2023-08-15 LAB — CANCER ANTIGEN 27.29: CA 27.29: 1448.5 U/mL — ABNORMAL HIGH (ref 0.0–38.6)

## 2023-08-18 DIAGNOSIS — J910 Malignant pleural effusion: Secondary | ICD-10-CM | POA: Diagnosis not present

## 2023-08-18 DIAGNOSIS — J90 Pleural effusion, not elsewhere classified: Secondary | ICD-10-CM | POA: Diagnosis not present

## 2023-08-18 DIAGNOSIS — C801 Malignant (primary) neoplasm, unspecified: Secondary | ICD-10-CM | POA: Diagnosis not present

## 2023-08-21 ENCOUNTER — Telehealth: Payer: Self-pay

## 2023-08-21 ENCOUNTER — Inpatient Hospital Stay (HOSPITAL_BASED_OUTPATIENT_CLINIC_OR_DEPARTMENT_OTHER): Payer: Medicare HMO | Admitting: Hematology and Oncology

## 2023-08-21 ENCOUNTER — Other Ambulatory Visit (HOSPITAL_COMMUNITY): Payer: Self-pay | Admitting: Hematology and Oncology

## 2023-08-21 ENCOUNTER — Inpatient Hospital Stay: Payer: Medicare HMO

## 2023-08-21 ENCOUNTER — Encounter: Payer: Self-pay | Admitting: Hematology and Oncology

## 2023-08-21 VITALS — BP 76/65 | HR 105 | Temp 97.7°F | Resp 18 | Ht 62.0 in | Wt 135.0 lb

## 2023-08-21 DIAGNOSIS — C787 Secondary malignant neoplasm of liver and intrahepatic bile duct: Secondary | ICD-10-CM

## 2023-08-21 DIAGNOSIS — J910 Malignant pleural effusion: Secondary | ICD-10-CM

## 2023-08-21 DIAGNOSIS — Z170 Estrogen receptor positive status [ER+]: Secondary | ICD-10-CM | POA: Diagnosis not present

## 2023-08-21 DIAGNOSIS — C7951 Secondary malignant neoplasm of bone: Secondary | ICD-10-CM

## 2023-08-21 DIAGNOSIS — C50412 Malignant neoplasm of upper-outer quadrant of left female breast: Secondary | ICD-10-CM | POA: Diagnosis not present

## 2023-08-21 LAB — CBC WITH DIFFERENTIAL (CANCER CENTER ONLY)
Abs Immature Granulocytes: 0.04 10*3/uL (ref 0.00–0.07)
Basophils Absolute: 0 10*3/uL (ref 0.0–0.1)
Basophils Relative: 1 %
Eosinophils Absolute: 0 10*3/uL (ref 0.0–0.5)
Eosinophils Relative: 0 %
HCT: 42.6 % (ref 36.0–46.0)
Hemoglobin: 13.6 g/dL (ref 12.0–15.0)
Immature Granulocytes: 1 %
Lymphocytes Relative: 19 %
Lymphs Abs: 1.6 10*3/uL (ref 0.7–4.0)
MCH: 31.9 pg (ref 26.0–34.0)
MCHC: 31.9 g/dL (ref 30.0–36.0)
MCV: 100 fL (ref 80.0–100.0)
Monocytes Absolute: 0.9 10*3/uL (ref 0.1–1.0)
Monocytes Relative: 10 %
Neutro Abs: 5.8 10*3/uL (ref 1.7–7.7)
Neutrophils Relative %: 69 %
Platelet Count: 270 10*3/uL (ref 150–400)
RBC: 4.26 MIL/uL (ref 3.87–5.11)
RDW: 15.1 % (ref 11.5–15.5)
WBC Count: 8.4 10*3/uL (ref 4.0–10.5)
nRBC: 0 % (ref 0.0–0.2)
nRBC: 0 /100{WBCs}

## 2023-08-21 LAB — CMP (CANCER CENTER ONLY)
ALT: 88 U/L — ABNORMAL HIGH (ref 0–44)
AST: 173 U/L (ref 15–41)
Albumin: 3.1 g/dL — ABNORMAL LOW (ref 3.5–5.0)
Alkaline Phosphatase: 484 U/L — ABNORMAL HIGH (ref 38–126)
Anion gap: 15 (ref 5–15)
BUN: 33 mg/dL — ABNORMAL HIGH (ref 8–23)
CO2: 22 mmol/L (ref 22–32)
Calcium: 9.6 mg/dL (ref 8.9–10.3)
Chloride: 101 mmol/L (ref 98–111)
Creatinine: 1.2 mg/dL — ABNORMAL HIGH (ref 0.44–1.00)
GFR, Estimated: 44 mL/min — ABNORMAL LOW (ref >60)
Glucose, Bld: 153 mg/dL — ABNORMAL HIGH (ref 70–99)
Potassium: 4.4 mmol/L (ref 3.5–5.1)
Sodium: 138 mmol/L (ref 135–145)
Total Bilirubin: 0.9 mg/dL (ref <1.2)
Total Protein: 6.2 g/dL — ABNORMAL LOW (ref 6.5–8.1)

## 2023-08-21 NOTE — Assessment & Plan Note (Signed)
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  Estrogen and progesterone receptors were positive. HER2 negative.  She initially responded to palbociclib/letrozole with a decrease in the CA 27-29, as well as a decrease in the pleural effusion and no new disease on CT in July 2023.    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  Repeat imaging in June unfortunately showed progressive metastasis within the liver.  She was then placed on abemaciclib 100 mg twice daily with fulvestrant injections monthly in June.   Due to difficulty with poor appetite, taste changes and weight loss, as well as new hyponatremia, abemaciclib was decreased to 100 mg daily, then ultimately discontinued. CT chest, abdomen and pelvis in September revealed mild increase in size of dominant hepatic metastasis in right lobe. Other diffuse small hepatic metastases show no significant change. Increased 3 mm right lower lobe pulmonary nodule and stable 4 mm right middle lobe nodule. Pulmonary metastases cannot be excluded. Stable large right pleural effusion and tiny left pleural effusion.  We discussed switching oral therapy to everolimus/exemestane.  She was hospitalized in October with severe hyponatremia.  When she recovered from that, she agreed to try the exemestane, but preferred to not take the oral chemotherapy.  She then developed worsening right pleural effusion last week requiring urgent thoracentesis she had removal of 400 cc of pleural fluid.  Cytology was positive for malignant cells. The shortness of breath improved with the thoracentesis but has worsened.  Chest x-ray this week revealed reaccumulation and she is  symptomatic. We will refer her to IR for thoracentesis with plans for Pleurx catheter.  She had not decided on hospice care, but the recurrent pleural effusion, worsening liver functions tests and her overall performance status she agrees to hospice care at this time.  She would like to continue endocrine therapy with exemestane, she understands that this would not be covered under hospice care. We discussed her desires in case of cardiopulmonary arrest and she does not desire any intervention including CPR, cardioversion, medications or intubation. I will not schedule her for follow up in our office, but we would be glad to see her if desired.

## 2023-08-21 NOTE — Progress Notes (Signed)
[facility]  [address] 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  Estrogen and progesterone receptors were positive. HER2 negative.  She initially responded to palbociclib/letrozole with a decrease in the CA 27-29, as well as a decrease in the pleural effusion and no new disease on CT in July 2023.    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  Repeat imaging in June unfortunately showed progressive metastasis within the liver.  She was then placed on abemaciclib 100 mg twice daily with fulvestrant injections monthly in June.   Due to difficulty with poor appetite, taste changes and weight loss, as well as new hyponatremia, abemaciclib was decreased to 100 mg daily, then ultimately discontinued. CT chest, abdomen and pelvis in September revealed mild increase in size of dominant hepatic metastasis in right lobe. Other diffuse small hepatic metastases show no significant change. Increased 3 mm right lower lobe pulmonary nodule and stable 4 mm right middle lobe nodule. Pulmonary metastases cannot be excluded. Stable large right pleural effusion and tiny left pleural effusion.  We discussed switching oral therapy to everolimus/exemestane.  She was hospitalized in October with severe hyponatremia.  When she recovered from that, she agreed to try the exemestane, but preferred to not take the oral chemotherapy.  She then developed worsening right pleural effusion last week requiring  urgent thoracentesis she had removal of 400 cc of pleural fluid.  Cytology was positive for malignant cells. The shortness of breath improved with the thoracentesis but has worsened.  Chest x-ray this week revealed reaccumulation and she is symptomatic. We will refer her to IR for thoracentesis with plans for Pleurx catheter.  She had not decided on hospice care, but the recurrent pleural effusion, worsening liver functions tests and her overall performance status she agrees to hospice care at this time.  She would like to continue endocrine therapy with exemestane, she understands that this would not be covered under hospice care. We discussed her desires in case of cardiopulmonary arrest and she does not desire any intervention including CPR, cardioversion, medications or intubation. I will not schedule her for follow up in our office, but we would be glad to see her if desired.  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  She is currently on palliative exemestane.  The liver enzymes have worsened. She has had reaccumulation of right malignant pleural effusion. She is agreeable to hospice care at this time.  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 She has been receiving denosumab monthly. We will discontinue that as she is going on hospice care.    The patient understands the plans discussed today and is in agreement with them.  She knows to contact our office if she develops concerns prior to her next appointment.   60 minutes was spent in patient care.  This included time spent preparing to see the patient (e.g., review of tests), obtaining and/or reviewing separately obtained history, counseling  and educating the patient/family/caregiver, ordering medications, tests, or procedures; documenting clinical information in the electronic or other health record,  independently interpreting results and communicating results to the patient/family/caregiver as well as coordination of care.    Carrie Perl, PA-C  [facility] - A DEPT OF [place] [address] [place] [address] Dept: [phone] Dept Fax: [phone]   Orders Placed This Encounter  Procedures   Ambulatory referral to Interventional Radiology    Referral Priority:   Urgent    Referral Type:   Consultation    Referral Reason:   Specialty Services Required    Requested Specialty:   Interventional Radiology    Number of Visits Requested:   1   Do not attempt resuscitation (DNR)    Discussed patient's desires in event of cardiopulmonary arrest and she does not desire any intervention    Order Specific Question:   If patient has no pulse and is not breathing    Answer:   Do Not Attempt Resuscitation    Order Specific Question:   If patient has a pulse and/or is breathing: Medical Treatment Goals    Answer:   COMFORT MEASURES: Keep clean/warm/dry, use medication by any route; positioning, wound care and other measures to relieve pain/suffering; use oxygen, suction/manual treatment of airway obstruction for comfort; do not transfer unless for comfort needs.    Order Specific Question:   Consent:    Answer:   Discussion documented in EHR or advanced directives reviewed      CHIEF COMPLAINT:  CC: Current metastatic hormone receptor positive breast cancer  Current Treatment: Exemestane 25 mg daily  HISTORY OF PRESENT ILLNESS:   Oncology History  Malignant neoplasm of upper-outer quadrant of left female breast (HCC)  03/07/2006 Initial Diagnosis   Breast cancer, left breast (HCC)   03/17/2006 Cancer Staging   Staging form: Breast, AJCC 6th Edition - Clinical stage from 03/17/2006: Stage IIIA (T1c, N2a, M0) - Signed by Dellia Beckwith, MD on 12/15/2020 Staged by: Managing physician Diagnostic confirmation:  Positive histology Specimen type: Excision Histopathologic type: Infiltrating duct carcinoma, NOS Tumor size (mm): 15 Laterality: Left Total positive nodes: 5 Histologic grade (G): G1 Residual tumor (R): R0 - None Stage prefix: Initial diagnosis Lymphatic vessel invasion (L): L0 - No lymphatic vessel invasion Venous invasion (V): V0 - No venous invasion Prognostic indicators: ER/PR pos, HER 2 neg., Rx with AC x 3 mo, then Taxol x 3 mo.,AI x 10 yrs   04/04/2023 - 06/27/2023 Chemotherapy   Patient is on Treatment Plan : BREAST Fulvestrant q28d     Malignant neoplasm metastatic to bone (HCC)  11/08/2022 Initial Diagnosis   Malignant neoplasm metastatic to bone (HCC)   04/04/2023 - 06/27/2023 Chemotherapy   Patient is on Treatment Plan : BREAST Fulvestrant q28d         INTERVAL HISTORY:  Carrie Wilkerson is here today for repeat clinical assessment.  She continues to do poorly.  She is in a recliner most of the days. She is not eating or drinking much.  She reports increased shortness of breath.  She has a stable dry cough.  She denies chest pain.  She denies lower extremity edema. She denies fevers or chills. She denies pain. Her appetite is good. Her weight has increased 12 pounds over last week .    REVIEW OF SYSTEMS:  Review of Systems  Constitutional:  Positive for appetite change, fatigue and unexpected weight change. Negative  for chills and fever.  HENT:   Negative for lump/mass, mouth sores and sore throat.   Respiratory:  Positive for cough and shortness of breath. Negative for hemoptysis.   Cardiovascular:  Negative for chest pain and leg swelling.  Gastrointestinal:  Negative for abdominal pain, constipation, diarrhea, nausea and vomiting.  Endocrine: Negative for hot flashes.  Genitourinary:  Negative for difficulty urinating, dysuria, frequency and hematuria.   Musculoskeletal:  Positive for back pain. Negative for arthralgias and myalgias.  Skin:  Negative for rash.  Neurological:   Negative for dizziness and headaches.  Hematological:  Negative for adenopathy. Does not bruise/bleed easily.  Psychiatric/Behavioral:  Negative for depression and sleep disturbance. The patient is not nervous/anxious.      VITALS:  Blood pressure (!) 76/65, pulse (!) 105, temperature 97.7 F (36.5 C), temperature source Oral, resp. rate 18, height 5\' 2"  (1.575 m), weight 135 lb (61.2 kg), SpO2 95%.  Wt Readings from Last 3 Encounters:  08/21/23 135 lb (61.2 kg)  08/14/23 123 lb 1.6 oz (55.8 kg)  08/07/23 137 lb 11.2 oz (62.5 kg)    Body mass index is 24.69 kg/m.  Performance status (ECOG): 3 - Symptomatic, >50% confined to bed  PHYSICAL EXAM:  Physical Exam Vitals and nursing note reviewed.  Constitutional:      General: She is not in acute distress.    Appearance: Normal appearance.  HENT:     Head: Normocephalic and atraumatic.     Mouth/Throat:     Mouth: Mucous membranes are dry.     Pharynx: Oropharynx is clear. No oropharyngeal exudate or posterior oropharyngeal erythema.  Eyes:     General: No scleral icterus.    Extraocular Movements: Extraocular movements intact.     Conjunctiva/sclera: Conjunctivae normal.     Pupils: Pupils are equal, round, and reactive to light.  Cardiovascular:     Rate and Rhythm: Normal rate and regular rhythm.     Heart sounds: Normal heart sounds. No murmur heard.    No friction rub. No gallop.  Pulmonary:     Effort: Pulmonary effort is normal.     Breath sounds: Normal breath sounds. No wheezing, rhonchi or rales.  Abdominal:     General: There is distension.     Palpations: Abdomen is soft. There is no mass.     Tenderness: There is no abdominal tenderness.     Comments: Probable ascites.  Musculoskeletal:        General: Normal range of motion.     Cervical back: Normal range of motion and neck supple. No tenderness.     Right lower leg: No edema.     Left lower leg: No edema.  Lymphadenopathy:     Cervical: No cervical  adenopathy.     Upper Body:     Right upper body: No supraclavicular or axillary adenopathy.     Left upper body: No supraclavicular or axillary adenopathy.  Skin:    General: Skin is warm and dry.     Coloration: Skin is not jaundiced.     Findings: No rash.  Neurological:     Mental Status: She is alert and oriented to person, place, and time.     Cranial Nerves: No cranial nerve deficit.  Psychiatric:        Mood and Affect: Mood normal.        Behavior: Behavior normal.        Thought Content: Thought content normal.     LABS:  Latest Ref Rng & Units 08/21/2023    2:11 PM 08/14/2023    1:53 PM 08/07/2023   12:00 AM  CBC  WBC 4.0 - 10.5 K/uL 8.4  8.3  8.4      Hemoglobin 12.0 - 15.0 g/dL 51.8  84.1  66.0      Hematocrit 36.0 - 46.0 % 42.6  41.7  39      Platelets 150 - 400 K/uL 270  344  386         This result is from an external source.      Latest Ref Rng & Units 08/21/2023    2:11 PM 08/14/2023    1:53 PM 08/07/2023   12:00 AM  CMP  Glucose 70 - 99 mg/dL 630  160    BUN 8 - 23 mg/dL 33  20  24      Creatinine 0.44 - 1.00 mg/dL 1.09  3.23  0.9      Sodium 135 - 145 mmol/L 138  137  140      Potassium 3.5 - 5.1 mmol/L 4.4  4.3  4.0      Chloride 98 - 111 mmol/L 101  98  102      CO2 22 - 32 mmol/L 22  25  34      Calcium 8.9 - 10.3 mg/dL 9.6  8.5  9.3      Total Protein 6.5 - 8.1 g/dL 6.2  6.7    Total Bilirubin <1.2 mg/dL 0.9  1.1    Alkaline Phos 38 - 126 U/L 484  353  307      AST 15 - 41 U/L 173  163  245      ALT 0 - 44 U/L 88  94  126         This result is from an external source.     Lab Results  Component Value Date   CEA1 1.6 10/11/2021   /  CEA  Date Value Ref Range Status  10/11/2021 1.6 0.0 - 4.7 ng/mL Final    Comment:    (NOTE)                             Nonsmokers          <3.9                             Smokers             <5.6 Roche [employer] Electrochemiluminescence Immunoassay (ECLIA) Values obtained with different  assay methods or kits cannot be used interchangeably.  Results cannot be interpreted as absolute evidence of the presence or absence of malignant disease. Performed At: [facility] [address] [place], [place] [id number] Jolene Schimke MD [place]:[id number]    No results found for: "PSA1" No results found for: "[id number]" No results found for: "CAN125"  No results found for: "TOTALPROTELP", "ALBUMINELP", "A1GS", "A2GS", "BETS", "BETA2SER", "GAMS", "MSPIKE", "SPEI" Lab Results  Component Value Date   FERRITIN 387 (H) 07/02/2023   No results found for: "LDH"  STUDIES:    Exam(s): [id number] RAD/DG CHEST 2V  CLINICAL DATA: Pleural effusion  EXAM:  CHEST - 2 VIEW  COMPARISON: X-ray chest August 07, 2023  FINDINGS:  Large right pleural effusion, increased from prior. Associated right  mid/lower lung zone atelectasis/infiltrate. Cardiomediastinal  silhouette obscured. No pneumothorax. Intact osseous structures.  Left axillary  clips.  IMPRESSION:  Large right pleural effusion, increased from prior. Associated right  mid/lower lung zone atelectasis/infiltrate.    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  Report date is 11/29/21.   The CustomNext-Cancer+RNAinsight panel offered by [employer] [facility] includes sequencing and rearrangement analysis for the following 47 genes:  APC,  ATM, AXIN2, BARD1, BMPR1A, BRCA1, BRCA2, BRIP1, CDH1, CDK4, CDKN2A, CHEK2, DICER1, EPCAM, GREM1, HOXB13,    History of left breast cancer 06/13/2021   Hypotension 06/13/2021   Hypothyroidism (acquired) 03/18/2023   Incisional hernia, without obstruction or gangrene 10/22/2016   Lymphedema of left upper extremity 06/12/2022   Malignant neoplasm metastatic to bone (HCC) 11/08/2022   Malignant neoplasm metastatic to liver (HCC) 11/08/2022   Malignant neoplasm of upper-outer quadrant of left female breast (HCC) 03/07/2006   Mixed dyslipidemia 11/03/2018   Numbness in right leg    Hx: of   Obesity (BMI 30.0-34.9) 09/21/2020   Other specified disorders of bone density and structure, multiple sites 09/20/2020   Paroxysmal atrial fibrillation (HCC) 02/13/2016   Personal history of COVID-19 09/2019   Pleural effusion, 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   Pneumonia    Hx: of 'a long time ago"   Postoperative examination 02/05/2016    Past Surgical History:  Procedure Laterality Date   APPENDECTOMY     BACK SURGERY     BREAST SURGERY     Hx: of lumpectomy left breast 2007   COLONOSCOPY     Hx: of   DILATION AND CURETTAGE OF UTERUS     TONSILLECTOMY     TOTAL HIP ARTHROPLASTY Right 04/13/2013   Dr Magnus Ivan   TOTAL HIP ARTHROPLASTY Right 04/13/2013   Procedure: RIGHT TOTAL HIP ARTHROPLASTY ANTERIOR APPROACH;  Surgeon: Kathryne Hitch, MD;  Location: [facility] OR;  Service: Orthopedics;  Laterality: Right;   TUBAL LIGATION      Family History  Problem Relation Age of Onset   Hypertension Sister    Cancer Sister 19       uterine or ovarian   Bone cancer Sister 84   Breast cancer Sister        dx 98 and 64   Uterine cancer Sister 46   Colon cancer Sister 76   Breast cancer Sister 81   Hypertension Brother    Kidney cancer Brother 69   Cancer Brother 35       unknown type; mets   Liver cancer Brother    Liver cancer Brother 18   Pancreatic cancer Niece 53   Breast cancer Niece 55   Colon  cancer Nephew 57       mets    Social History:  reports that she has never smoked. She has never used smokeless tobacco. She reports that she does not drink alcohol and does not use drugs.The patient is accompanied by her husband and daughter today.  Allergies:  Allergies  Allergen Reactions  Codeine     unknown    Sulfa Antibiotics     unknown   Sulfacetamide Sodium Other (See Comments)    unknown   Chlorhexidine Rash    Current Medications: Current Outpatient Medications  Medication Sig Dispense Refill   apixaban (ELIQUIS) 5 MG TABS tablet TAKE 1 TABLET BY MOUTH TWICE A DAY 180 tablet 1   exemestane (AROMASIN) 25 MG tablet Take 1 tablet (25 mg total) by mouth daily after breakfast. 30 tablet 5   Flaxseed, Linseed, OIL Take 1 capsule by mouth daily.     glucosamine-chondroitin 500-400 MG tablet Take 1 tablet by mouth 2 (two) times daily.      levothyroxine (SYNTHROID, LEVOTHROID) 75 MCG tablet Take 75 mcg by mouth daily.   7   metoprolol tartrate (LOPRESSOR) 25 MG tablet Take 1 tablet (25 mg total) by mouth 2 (two) times daily. 180 tablet 2   Multiple Vitamin (MULTIVITAMIN WITH MINERALS) TABS Take 1 tablet by mouth daily.     ondansetron (ZOFRAN) 4 MG tablet Take 1 tablet (4 mg total) by mouth every 4 (four) hours as needed for nausea or vomiting. 30 tablet 5   prochlorperazine (COMPAZINE) 10 MG tablet Take 1 tablet (10 mg total) by mouth every 6 (six) hours as needed for nausea or vomiting. 30 tablet 5   No current facility-administered medications for this visit.

## 2023-08-21 NOTE — Telephone Encounter (Signed)
CRITICAL VALUE STICKER  CRITICAL VALUE: AST [id number]  RECEIVER (on-site recipient of call):Marylene Land, LPN  DATE & TIME NOTIFIED:08/21/23 [phone]  MESSENGER (representative from lab):Jay at [facility]  MD NOTIFIED: Belva Crome, PA-C  TIME OF NOTIFICATION: 1508  RESPONSE:  Ok in with patient now

## 2023-08-21 NOTE — Telephone Encounter (Signed)
-----   Message from [name] sent at 08/21/2023  3:23 PM EST ----- Please let hospice know that she is agreeable to admission, but we would like her to undergo thoracentesis with placement of a pleurx cath. She is on exemestane and wants to continue for now. Understands this would not be covered. DNR done today.

## 2023-08-21 NOTE — Assessment & Plan Note (Signed)
Hepatic metastasis with slow progression over the last several months.  She is currently on palliative exemestane.  The liver enzymes have worsened. She has had reaccumulation of right malignant pleural effusion. She is agreeable to hospice care at this time.

## 2023-08-21 NOTE — Telephone Encounter (Signed)
Spoke with Melissa at [facility] made them aware patient has agreed to admission of services , made a DNR today and Will be scheduled for thoracentesis and placement of a pleurx cath, also on Exemestane and would like to continue this .

## 2023-08-21 NOTE — Assessment & Plan Note (Signed)
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 She has been receiving denosumab monthly. We will discontinue that as she is going on hospice care.

## 2023-08-25 ENCOUNTER — Other Ambulatory Visit (HOSPITAL_COMMUNITY): Payer: Self-pay | Admitting: Oncology

## 2023-08-25 ENCOUNTER — Other Ambulatory Visit (HOSPITAL_COMMUNITY): Payer: Self-pay | Admitting: Physician Assistant

## 2023-08-25 ENCOUNTER — Telehealth: Payer: Self-pay

## 2023-08-25 ENCOUNTER — Ambulatory Visit (HOSPITAL_COMMUNITY)
Admission: RE | Admit: 2023-08-25 | Discharge: 2023-08-25 | Disposition: A | Discharge status: Home / Self Care | Source: Ambulatory Visit | Attending: Hematology and Oncology | Admitting: Hematology and Oncology

## 2023-08-25 ENCOUNTER — Ambulatory Visit (HOSPITAL_COMMUNITY)
Admission: RE | Admit: 2023-08-25 | Discharge: 2023-08-25 | Disposition: A | Discharge status: Home / Self Care | Source: Ambulatory Visit | Attending: Physician Assistant | Admitting: Physician Assistant

## 2023-08-25 DIAGNOSIS — C787 Secondary malignant neoplasm of liver and intrahepatic bile duct: Secondary | ICD-10-CM

## 2023-08-25 DIAGNOSIS — C7951 Secondary malignant neoplasm of bone: Secondary | ICD-10-CM

## 2023-08-25 DIAGNOSIS — J90 Pleural effusion, not elsewhere classified: Secondary | ICD-10-CM

## 2023-08-25 DIAGNOSIS — J910 Malignant pleural effusion: Secondary | ICD-10-CM | POA: Diagnosis not present

## 2023-08-25 DIAGNOSIS — C50412 Malignant neoplasm of upper-outer quadrant of left female breast: Secondary | ICD-10-CM | POA: Insufficient documentation

## 2023-08-25 DIAGNOSIS — Z170 Estrogen receptor positive status [ER+]: Secondary | ICD-10-CM | POA: Diagnosis not present

## 2023-08-25 HISTORY — PX: IR THORACENTESIS ASP PLEURAL SPACE W/IMG GUIDE: IMG5380

## 2023-08-25 MED ORDER — LIDOCAINE HCL 1 % IJ SOLN
20.0000 mL | Freq: Once | INTRAMUSCULAR | Status: AC
  Administered 2023-08-25: 20 mL via INTRADERMAL

## 2023-08-25 MED ORDER — LIDOCAINE HCL 1 % IJ SOLN
INTRAMUSCULAR | Status: AC
  Filled 2023-08-25: qty 20

## 2023-08-25 NOTE — Procedures (Signed)
PROCEDURE SUMMARY:  Successful US guided right thoracentesis. Yielded 2.4 L of clear golden fluid. Patient tolerated procedure well. No immediate complications. EBL = trace Horald Birky S Barron Vanloan PA-C 08/25/2023 4:06 PM
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
## 2023-08-26 ENCOUNTER — Encounter: Payer: Self-pay | Admitting: Oncology

## 2023-08-26 ENCOUNTER — Other Ambulatory Visit: Payer: Self-pay | Admitting: Hematology and Oncology

## 2023-08-26 DIAGNOSIS — J910 Malignant pleural effusion: Secondary | ICD-10-CM

## 2023-08-27 ENCOUNTER — Other Ambulatory Visit: Payer: Self-pay | Admitting: Radiology

## 2023-08-28 ENCOUNTER — Ambulatory Visit (HOSPITAL_COMMUNITY)
Admission: RE | Admit: 2023-08-28 | Discharge: 2023-08-28 | Disposition: A | Discharge status: Home / Self Care | Source: Ambulatory Visit | Attending: Oncology | Admitting: Oncology

## 2023-08-28 ENCOUNTER — Encounter (HOSPITAL_COMMUNITY): Payer: Self-pay

## 2023-08-28 ENCOUNTER — Other Ambulatory Visit: Payer: Self-pay

## 2023-08-28 DIAGNOSIS — J90 Pleural effusion, not elsewhere classified: Secondary | ICD-10-CM | POA: Insufficient documentation

## 2023-08-28 DIAGNOSIS — C801 Malignant (primary) neoplasm, unspecified: Secondary | ICD-10-CM | POA: Diagnosis not present

## 2023-08-28 DIAGNOSIS — J910 Malignant pleural effusion: Secondary | ICD-10-CM | POA: Diagnosis not present

## 2023-08-28 HISTORY — PX: IR PERC PLEURAL DRAIN W/INDWELL CATH W/IMG GUIDE: IMG5383

## 2023-08-28 MED ORDER — LIDOCAINE-EPINEPHRINE 1 %-1:100000 IJ SOLN
INTRAMUSCULAR | Status: AC
  Filled 2023-08-28: qty 1

## 2023-08-28 MED ORDER — CEFAZOLIN SODIUM-DEXTROSE 2-4 GM/100ML-% IV SOLN
INTRAVENOUS | Status: DC | PRN
  Administered 2023-08-28: 2 g via INTRAVENOUS

## 2023-08-28 MED ORDER — LIDOCAINE HCL 1 % IJ SOLN
INTRAMUSCULAR | Status: AC
  Filled 2023-08-28: qty 20

## 2023-08-28 MED ORDER — LIDOCAINE-EPINEPHRINE 1 %-1:100000 IJ SOLN
20.0000 mL | Freq: Once | INTRAMUSCULAR | Status: AC
  Administered 2023-08-28: 10 mL via INTRADERMAL

## 2023-08-28 MED ORDER — FENTANYL CITRATE (PF) 100 MCG/2ML IJ SOLN
INTRAMUSCULAR | Status: AC
  Filled 2023-08-28: qty 2

## 2023-08-28 MED ORDER — CEFAZOLIN SODIUM-DEXTROSE 2-4 GM/100ML-% IV SOLN
INTRAVENOUS | Status: AC
  Filled 2023-08-28: qty 100

## 2023-08-28 MED ORDER — SODIUM CHLORIDE 0.9% FLUSH: 10.0000 mL | Freq: Two times a day (BID) | INTRAVENOUS | Status: DC

## 2023-08-28 MED ORDER — MIDAZOLAM HCL 2 MG/2ML IJ SOLN
INTRAMUSCULAR | Status: AC
Start: 2023-08-28 — End: ?
  Filled 2023-08-28: qty 2

## 2023-08-28 MED ORDER — LIDOCAINE HCL 1 % IJ SOLN
20.0000 mL | Freq: Once | INTRAMUSCULAR | Status: AC
  Administered 2023-08-28: 10 mL via INTRADERMAL

## 2023-08-28 NOTE — Progress Notes (Signed)
Patient discharged home via wheelchair with family. Vitals signs reviewed by provider, discharged instructions provided and reviewed. No further questions at this time.

## 2023-08-28 NOTE — H&P (Signed)
Chief Complaint: Patient was seen in consultation today for Right Thoracic PleurX catheter placement at the request of McCarty,Christine H  Referring Physician(s): McCarty,Christine H  Supervising Physician: Richarda Overlie  Patient Status: [facility] - Out-pt  History of Present Illness: Carrie Wilkerson is a 82 y.o. female   Current Code Status  DNR - Set by [name], PA-C at 08/21/2023 1514 (View report)  Comments: Discussed patient's desires in event of cardiopulmonary arrest and she does not desire any intervention  Question Answer  If patient has no pulse and is not breathing Do Not Attempt Resuscitation  If patient has a pulse and/or is breathing: Medical Treatment Goals COMFORT MEASURES: Keep clean/warm/dry, use medication by any route; positioning, wound care and other measures to relieve pain/suffering; use oxygen, suction/manual treatment of airway obstruction for comfort; do not transfer unless for comfort needs.  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  Report date is 11/29/21.   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   Mixed dyslipidemia 11/03/2018   Numbness in right leg    Hx: of   Obesity (BMI 30.0-34.9) 09/21/2020   Other specified disorders of bone density and structure, multiple sites 09/20/2020   Paroxysmal atrial fibrillation (HCC) 02/13/2016   Personal history of COVID-19 09/2019   Pleural effusion, 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   Pneumonia    Hx: of 'a long time ago"   Postoperative examination 02/05/2016    Past Surgical History:  Procedure Laterality Date   APPENDECTOMY     BACK SURGERY     BREAST SURGERY     Hx: of lumpectomy left breast 2007   COLONOSCOPY     Hx: of   DILATION AND CURETTAGE OF UTERUS     IR THORACENTESIS ASP PLEURAL SPACE W/IMG GUIDE  08/25/2023   TONSILLECTOMY     TOTAL HIP ARTHROPLASTY Right 04/13/2013   Dr Magnus Ivan   TOTAL HIP ARTHROPLASTY Right 04/13/2013   Procedure: RIGHT TOTAL HIP ARTHROPLASTY ANTERIOR APPROACH;  Surgeon: Kathryne Hitch, MD;  Location: [facility] OR;  Service: Orthopedics;  Laterality:  Right;   TUBAL LIGATION      Allergies: Betadine [povidone-iodine], Codeine, Sulfa antibiotics, Sulfacetamide sodium, and Chlorhexidine  Medications: Prior to Admission medications   Medication Sig Start Date End Date Taking? Authorizing Provider  exemestane (AROMASIN) 25 MG tablet Take 1 tablet (25 mg  total) by mouth daily after breakfast. 07/09/23  Yes Dellia Beckwith, MD  levothyroxine (SYNTHROID, LEVOTHROID) 75 MCG tablet Take 75 mcg by mouth daily.  08/13/17  Yes [provider]  metoprolol tartrate (LOPRESSOR) 25 MG tablet Take 1 tablet (25 mg total) by mouth 2 (two) times daily. 07/10/23  Yes Revankar, Aundra Dubin, MD  Multiple Vitamin (MULTIVITAMIN WITH MINERALS) TABS Take 1 tablet by mouth daily.   Yes [provider]  apixaban (ELIQUIS) 5 MG TABS tablet TAKE 1 TABLET BY MOUTH TWICE A DAY 05/05/23   Revankar, Aundra Dubin, MD  Flaxseed, Linseed, OIL Take 1 capsule by mouth daily.    [provider]  glucosamine-chondroitin 500-400 MG tablet Take 1 tablet by mouth 2 (two) times daily.     [provider]  ondansetron (ZOFRAN) 4 MG tablet Take 1 tablet (4 mg total) by mouth every 4 (four) hours as needed for nausea or vomiting. 11/27/22   Dellia Beckwith, MD  prochlorperazine (COMPAZINE) 10 MG tablet Take 1 tablet (10 mg total) by mouth every 6 (six) hours as needed for nausea or vomiting. 11/27/22   Dellia Beckwith, MD     Family History  Problem Relation Age of Onset   Hypertension Sister    Cancer Sister 16       uterine or ovarian   Bone cancer Sister 65   Breast cancer Sister        dx 34 and 27   Uterine cancer Sister 64   Colon cancer Sister 81   Breast cancer Sister 42   Hypertension Brother    Kidney cancer Brother 41   Cancer Brother 3       unknown type; mets   Liver cancer Brother    Liver cancer Brother 77   Pancreatic cancer Niece 69   Breast cancer Niece 38   Colon cancer Nephew 57       mets    Social History   Socioeconomic History   Marital status: Married    Spouse name: Not on file   Number of children: Not on file   Years of education: Not on file   Highest education level: Not on file  Occupational History   Not on file  Tobacco Use   Smoking status: Never   Smokeless tobacco: Never  Vaping Use    Vaping status: Never Used  Substance and Sexual Activity   Alcohol use: No   Drug use: No   Sexual activity: Not on file  Other Topics Concern   Not on file  Social History Narrative   Not on file   Social Determinants of Health   Financial Resource Strain: Not on file  Food Insecurity: Not on file  Transportation Needs: Not on file  Physical Activity: Not on file  Stress: Not on file  Social Connections: Not on file    Review of Systems: A 12 point ROS discussed and pertinent positives are indicated in the HPI above.  All other systems are negative.  Review of Systems  Constitutional:  Positive for activity change and fatigue.  Respiratory:  Positive for shortness of breath. Negative for cough.   Cardiovascular:  Negative for chest pain.  Neurological:  Positive for weakness.  Psychiatric/Behavioral:  Negative for behavioral problems and confusion.     Vital Signs: BP (!) 104/55   Pulse 100   Temp 98.2 F (36.8 C) (Oral)   Resp 16   Ht 5\' 2"  (1.575 m)   Wt 135 lb (61.2 kg)   SpO2 98%   BMI 24.69 kg/m   Advance Care Plan: The advanced care plan/surrogate decision maker was discussed at the time of visit and documented in the medical record.    Physical Exam Vitals reviewed.  HENT:     Mouth/Throat:     Mouth: Mucous membranes are moist.  Cardiovascular:     Rate and Rhythm: Normal rate and regular rhythm.  Pulmonary:     Effort: Pulmonary effort is normal.     Breath sounds: Rales present. No wheezing.  Abdominal:     Palpations: Abdomen is soft.  Musculoskeletal:        General: Normal range of motion.  Skin:    General: Skin is warm.  Neurological:     Mental Status: She is alert and oriented to person, place, and time.  Psychiatric:     Comments: Pt is very weak Dtr at bedside signs consent     Imaging: DG Chest 1 View  Result Date: 08/25/2023 CLINICAL DATA:  Thoracentesis EXAM: CHEST  1 VIEW COMPARISON:  08/18/2023 FINDINGS: Small right  pleural effusion, significantly decreased in size compared to the prior study. Improving aeration of the right lung. No pneumothorax. Stable heart size. Left lung is clear. IMPRESSION: Small right pleural effusion, significantly decreased in size compared to the prior study. No pneumothorax. Electronically Signed   By: Duanne Guess D.O.   On: 08/25/2023 18:07   IR THORACENTESIS ASP PLEURAL SPACE W/IMG GUIDE  Result Date: 08/25/2023 INDICATION: Recurrent malignant right pleural effusion. Request for therapeutic thoracentesis. EXAM: ULTRASOUND GUIDED RIGHT THORACENTESIS MEDICATIONS: 1% lidocaine 10 mL COMPLICATIONS: None immediate. PROCEDURE: An ultrasound guided thoracentesis was thoroughly discussed with the patient and questions answered. The benefits, risks, alternatives and complications were also discussed. The patient understands and wishes to proceed with the procedure. Written consent was obtained. Ultrasound was performed to localize and mark an adequate pocket of fluid in the right chest. The area was then prepped and draped in the normal sterile fashion. 1% Lidocaine was used for local anesthesia. Under ultrasound guidance a 6 Fr Safe-T-Centesis catheter was introduced. Thoracentesis was performed. The catheter was removed and a dressing applied. FINDINGS: A total of approximately 2.4 L of clear golden fluid was removed. IMPRESSION: Successful ultrasound guided right thoracentesis yielding 2.4 L of pleural fluid. Procedure performed by: Corrin Parker, PA-C Electronically Signed   By: Irish Lack M.D.   On: 08/25/2023 16:04    Labs:  CBC: Recent Labs    07/31/23 0000 08/07/23 0000 08/14/23 1353 08/21/23 1411  WBC 7.1  7.1 8.4 8.3 8.4  HGB 12.7  12.7 13.1 13.6 13.6  HCT 38  38 39 41.7 42.6  PLT 347  347 386 344 270    COAGS: Recent Labs    11/18/22 0000  INR 1.00    BMP: Recent Labs    05/14/23 1310 05/28/23 1328 06/13/23 0000 07/31/23 0000 08/07/23 0000  08/14/23 1353 08/21/23 1411  NA 131* 135   < > 141  141 140 137 138  K 4.3 4.0   < > 4.1  4.1 4.0 4.3 4.4  CL 98 99   < > 102  102 102 98  101  CO2 24 25   < > 33*  33* 34* 25 22  GLUCOSE 99 92  --   --   --  107* 153*  BUN 11 16   < > 36*  36* 24* 20 33*  CALCIUM 9.0 9.5   < > 10.1  10.1 9.3 8.5* 9.6  CREATININE 1.06* 1.14*   < > 1.0  1.0 0.9 1.12* 1.20*  GFRNONAA 52* 47*  --   --   --  48* 44*   < > = values in this interval not displayed.    LIVER FUNCTION TESTS: Recent Labs    05/14/23 1310 05/28/23 1328 06/13/23 0000 07/31/23 0000 08/07/23 0000 08/14/23 1353 08/21/23 1411  BILITOT 0.4 0.6  --   --   --  1.1 0.9  AST 26 26   < > 149*  149* 245* 163* 173*  ALT 16 17   < > 95*  95* 126* 94* 88*  ALKPHOS 45 57   < > 272*  272* 307* 353* 484*  PROT 6.7 6.8  --   --   --  6.7 6.2*  ALBUMIN 3.4* 3.6   < > 3.7  3.7 3.5 3.4* 3.1*   < > = values in this interval not displayed.    TUMOR MARKERS: No results for input(s): "AFPTM", "CEA", "CA199", "CHROMGRNA" in the last 8760 hours.  Assessment and Plan:  Scheduled for Rt PleurX catheter placement in IR Pt and Dtr are aware of procedure benefits and risks including but not limited to Infection; bleeding; damage to surrounding structures; pneumothorax Agreeable to proceed  Thank you for this interesting consult.  I greatly enjoyed meeting Zafiro Milnes and look forward to participating in their care.  A copy of this report was sent to the requesting provider on this date.  Electronically Signed: Robet Leu, PA-C 08/28/2023, 8:58 AM   I spent a total of  30 Minutes   in face to face in clinical consultation, greater than 50% of which was counseling/coordinating care for Rt Thoracic PleurX catheter

## 2023-08-29 ENCOUNTER — Telehealth: Payer: Self-pay

## 2023-08-29 NOTE — Telephone Encounter (Signed)
Carrie Wilkerson had Pleurex placed yesterday. Kristin requesting verbal order to drain Pleurex twice weekly & PRN. She is going out to see pt today.

## 2023-09-01 ENCOUNTER — Telehealth: Payer: Self-pay

## 2023-09-01 ENCOUNTER — Other Ambulatory Visit: Payer: Self-pay | Admitting: Hematology and Oncology

## 2023-09-01 MED ORDER — LORAZEPAM 0.5 MG PO TABS: 0.5000 mg | ORAL_TABLET | Freq: Three times a day (TID) | ORAL | 0 refills | Status: DC

## 2023-09-01 MED ORDER — HALOPERIDOL 0.5 MG PO TABS: 0.5000 mg | ORAL_TABLET | ORAL | 2 refills | Status: DC | PRN

## 2023-09-01 NOTE — Telephone Encounter (Signed)
Kristen nurse with [facility] called to give an update on patient. Hospice and patient decided to stop patients Eliquis and Metoprolol. Vital signs are 78/42 and HR 91. Drained out of Pleurex drain which was dark red in color. Also patient has only have pain medication a couple of times. One being last night. Hospice needing refill on Haloperidol and Ativan. Dosage as follow Haloperidol 0.5mg  PO every 4 hours as needed, and Ativan 0.5mg  PO every 8 hours as needed.

## 2023-09-07 DEATH — deceased

## 2023-09-15 ENCOUNTER — Encounter: Payer: Self-pay | Admitting: Hematology and Oncology
